# Patient Record
Sex: Male | Born: 1960 | Race: White | Hispanic: No | Marital: Married | Smoking: Current every day smoker
Health system: Southern US, Community
[De-identification: ages and names within clinical notes are randomized; demographics above are authoritative.]

## PROBLEM LIST (undated history)

## (undated) VITALS — BP 108/68 | HR 64 | Temp 97.0°F | Resp 20 | Ht 70.0 in | Wt 139.0 lb

## (undated) DIAGNOSIS — F1010 Alcohol abuse, uncomplicated: Secondary | ICD-10-CM

## (undated) DIAGNOSIS — R011 Cardiac murmur, unspecified: Secondary | ICD-10-CM

## (undated) DIAGNOSIS — R000 Tachycardia, unspecified: Secondary | ICD-10-CM

## (undated) DIAGNOSIS — I639 Cerebral infarction, unspecified: Secondary | ICD-10-CM

## (undated) DIAGNOSIS — N179 Acute kidney failure, unspecified: Secondary | ICD-10-CM

## (undated) DIAGNOSIS — M1990 Unspecified osteoarthritis, unspecified site: Secondary | ICD-10-CM

## (undated) DIAGNOSIS — I10 Essential (primary) hypertension: Secondary | ICD-10-CM

## (undated) DIAGNOSIS — Q676 Pectus excavatum: Secondary | ICD-10-CM

## (undated) DIAGNOSIS — M8700 Idiopathic aseptic necrosis of unspecified bone: Secondary | ICD-10-CM

## (undated) DIAGNOSIS — K219 Gastro-esophageal reflux disease without esophagitis: Secondary | ICD-10-CM

## (undated) DIAGNOSIS — F1310 Sedative, hypnotic or anxiolytic abuse, uncomplicated: Secondary | ICD-10-CM

## (undated) DIAGNOSIS — F319 Bipolar disorder, unspecified: Secondary | ICD-10-CM

## (undated) DIAGNOSIS — B1920 Unspecified viral hepatitis C without hepatic coma: Secondary | ICD-10-CM

## (undated) DIAGNOSIS — Z720 Tobacco use: Secondary | ICD-10-CM

## (undated) DIAGNOSIS — K7460 Unspecified cirrhosis of liver: Secondary | ICD-10-CM

## (undated) DIAGNOSIS — I6529 Occlusion and stenosis of unspecified carotid artery: Secondary | ICD-10-CM

## (undated) DIAGNOSIS — J189 Pneumonia, unspecified organism: Secondary | ICD-10-CM

## (undated) DIAGNOSIS — L409 Psoriasis, unspecified: Secondary | ICD-10-CM

## (undated) DIAGNOSIS — G629 Polyneuropathy, unspecified: Secondary | ICD-10-CM

## (undated) DIAGNOSIS — J392 Other diseases of pharynx: Secondary | ICD-10-CM

## (undated) DIAGNOSIS — M549 Dorsalgia, unspecified: Secondary | ICD-10-CM

## (undated) DIAGNOSIS — H6093 Unspecified otitis externa, bilateral: Secondary | ICD-10-CM

## (undated) HISTORY — DX: Occlusion and stenosis of unspecified carotid artery: I65.29

## (undated) HISTORY — DX: Unspecified otitis externa, bilateral: H60.93

## (undated) HISTORY — DX: Pectus excavatum: Q67.6

## (undated) HISTORY — PX: HERNIA REPAIR: SHX51

## (undated) HISTORY — DX: Acute kidney failure, unspecified: N17.9

## (undated) HISTORY — PX: TONSILLECTOMY: SUR1361

## (undated) HISTORY — DX: Essential (primary) hypertension: I10

## (undated) HISTORY — PX: OTHER SURGICAL HISTORY: SHX169

## (undated) HISTORY — DX: Tachycardia, unspecified: R00.0

## (undated) HISTORY — DX: Sedative, hypnotic or anxiolytic abuse, uncomplicated: F13.10

## (undated) HISTORY — DX: Alcohol abuse, uncomplicated: F10.10

## (undated) HISTORY — DX: Idiopathic aseptic necrosis of unspecified bone: M87.00

## (undated) HISTORY — DX: Tobacco use: Z72.0

## (undated) HISTORY — DX: Polyneuropathy, unspecified: G62.9

## (undated) HISTORY — DX: Bipolar disorder, unspecified: F31.9

## (undated) HISTORY — DX: Psoriasis, unspecified: L40.9

## (undated) HISTORY — DX: Unspecified viral hepatitis C without hepatic coma: B19.20

---

## 1987-08-06 HISTORY — PX: APPENDECTOMY: SHX54

## 1998-01-01 ENCOUNTER — Emergency Department (HOSPITAL_COMMUNITY): Admission: EM | Admit: 1998-01-01 | Discharge: 1998-01-01 | Payer: Self-pay | Admitting: Emergency Medicine

## 1998-03-03 ENCOUNTER — Ambulatory Visit (HOSPITAL_COMMUNITY): Admission: RE | Admit: 1998-03-03 | Discharge: 1998-03-03 | Payer: Self-pay | Admitting: Gastroenterology

## 1998-07-10 ENCOUNTER — Ambulatory Visit (HOSPITAL_COMMUNITY): Admission: RE | Admit: 1998-07-10 | Discharge: 1998-07-10 | Payer: Self-pay | Admitting: *Deleted

## 1998-07-12 ENCOUNTER — Emergency Department (HOSPITAL_COMMUNITY): Admission: EM | Admit: 1998-07-12 | Discharge: 1998-07-12 | Payer: Self-pay | Admitting: Emergency Medicine

## 1998-10-24 ENCOUNTER — Ambulatory Visit (HOSPITAL_COMMUNITY): Admission: RE | Admit: 1998-10-24 | Discharge: 1998-10-24 | Payer: Self-pay | Admitting: Cardiology

## 1998-11-21 ENCOUNTER — Emergency Department (HOSPITAL_COMMUNITY): Admission: EM | Admit: 1998-11-21 | Discharge: 1998-11-21 | Payer: Self-pay | Admitting: Emergency Medicine

## 1998-12-09 ENCOUNTER — Emergency Department (HOSPITAL_COMMUNITY): Admission: EM | Admit: 1998-12-09 | Discharge: 1998-12-10 | Payer: Self-pay | Admitting: Internal Medicine

## 1998-12-22 ENCOUNTER — Emergency Department (HOSPITAL_COMMUNITY): Admission: EM | Admit: 1998-12-22 | Discharge: 1998-12-22 | Payer: Self-pay | Admitting: Emergency Medicine

## 1999-01-04 ENCOUNTER — Emergency Department (HOSPITAL_COMMUNITY): Admission: EM | Admit: 1999-01-04 | Discharge: 1999-01-04 | Payer: Self-pay | Admitting: Emergency Medicine

## 1999-04-27 ENCOUNTER — Encounter: Payer: Self-pay | Admitting: Emergency Medicine

## 1999-04-27 ENCOUNTER — Emergency Department (HOSPITAL_COMMUNITY): Admission: EM | Admit: 1999-04-27 | Discharge: 1999-04-27 | Payer: Self-pay | Admitting: Emergency Medicine

## 1999-08-05 ENCOUNTER — Emergency Department (HOSPITAL_COMMUNITY): Admission: EM | Admit: 1999-08-05 | Discharge: 1999-08-05 | Payer: Self-pay | Admitting: Emergency Medicine

## 1999-08-06 HISTORY — PX: OTHER SURGICAL HISTORY: SHX169

## 1999-12-10 ENCOUNTER — Emergency Department (HOSPITAL_COMMUNITY): Admission: EM | Admit: 1999-12-10 | Discharge: 1999-12-10 | Payer: Self-pay | Admitting: Emergency Medicine

## 2000-01-14 ENCOUNTER — Emergency Department (HOSPITAL_COMMUNITY): Admission: EM | Admit: 2000-01-14 | Discharge: 2000-01-14 | Payer: Self-pay | Admitting: Emergency Medicine

## 2000-01-16 ENCOUNTER — Encounter: Payer: Self-pay | Admitting: Orthopedic Surgery

## 2000-01-16 ENCOUNTER — Inpatient Hospital Stay (HOSPITAL_COMMUNITY): Admission: RE | Admit: 2000-01-16 | Discharge: 2000-01-20 | Payer: Self-pay | Admitting: Orthopedic Surgery

## 2000-01-16 ENCOUNTER — Encounter (INDEPENDENT_AMBULATORY_CARE_PROVIDER_SITE_OTHER): Payer: Self-pay | Admitting: *Deleted

## 2000-01-18 ENCOUNTER — Encounter: Payer: Self-pay | Admitting: Orthopedic Surgery

## 2000-02-03 ENCOUNTER — Emergency Department (HOSPITAL_COMMUNITY): Admission: EM | Admit: 2000-02-03 | Discharge: 2000-02-03 | Payer: Self-pay | Admitting: Emergency Medicine

## 2000-08-05 HISTORY — PX: JOINT REPLACEMENT: SHX530

## 2000-08-05 HISTORY — PX: OTHER SURGICAL HISTORY: SHX169

## 2000-10-16 ENCOUNTER — Ambulatory Visit (HOSPITAL_COMMUNITY): Admission: RE | Admit: 2000-10-16 | Discharge: 2000-10-16 | Payer: Self-pay | Admitting: Family Medicine

## 2000-10-16 ENCOUNTER — Encounter: Payer: Self-pay | Admitting: Family Medicine

## 2000-10-28 ENCOUNTER — Encounter: Payer: Self-pay | Admitting: Family Medicine

## 2000-10-28 ENCOUNTER — Ambulatory Visit (HOSPITAL_COMMUNITY): Admission: RE | Admit: 2000-10-28 | Discharge: 2000-10-28 | Payer: Self-pay | Admitting: Family Medicine

## 2000-12-17 ENCOUNTER — Ambulatory Visit (HOSPITAL_BASED_OUTPATIENT_CLINIC_OR_DEPARTMENT_OTHER): Admission: RE | Admit: 2000-12-17 | Discharge: 2000-12-17 | Payer: Self-pay | Admitting: Orthopedic Surgery

## 2001-02-23 ENCOUNTER — Ambulatory Visit (HOSPITAL_COMMUNITY): Admission: RE | Admit: 2001-02-23 | Discharge: 2001-02-23 | Payer: Self-pay | Admitting: Orthopaedic Surgery

## 2001-05-04 ENCOUNTER — Inpatient Hospital Stay (HOSPITAL_COMMUNITY): Admission: EM | Admit: 2001-05-04 | Discharge: 2001-05-09 | Payer: Self-pay | Admitting: Psychiatry

## 2001-05-12 ENCOUNTER — Other Ambulatory Visit (HOSPITAL_COMMUNITY): Admission: RE | Admit: 2001-05-12 | Discharge: 2001-06-05 | Payer: Self-pay | Admitting: Psychiatry

## 2002-03-26 ENCOUNTER — Encounter: Admission: RE | Admit: 2002-03-26 | Discharge: 2002-03-26 | Payer: Self-pay | Admitting: Internal Medicine

## 2002-08-05 HISTORY — PX: OTHER SURGICAL HISTORY: SHX169

## 2002-10-25 ENCOUNTER — Encounter: Admission: RE | Admit: 2002-10-25 | Discharge: 2002-10-25 | Payer: Self-pay | Admitting: Internal Medicine

## 2002-11-17 ENCOUNTER — Ambulatory Visit (HOSPITAL_COMMUNITY): Admission: RE | Admit: 2002-11-17 | Discharge: 2002-11-17 | Payer: Self-pay

## 2002-11-23 ENCOUNTER — Encounter: Admission: RE | Admit: 2002-11-23 | Discharge: 2002-11-23 | Payer: Self-pay | Admitting: Internal Medicine

## 2002-11-30 ENCOUNTER — Encounter: Admission: RE | Admit: 2002-11-30 | Discharge: 2002-11-30 | Payer: Self-pay | Admitting: Internal Medicine

## 2002-12-31 ENCOUNTER — Encounter: Admission: RE | Admit: 2002-12-31 | Discharge: 2002-12-31 | Payer: Self-pay | Admitting: Internal Medicine

## 2003-02-01 ENCOUNTER — Encounter: Payer: Self-pay | Admitting: Orthopedic Surgery

## 2003-02-02 ENCOUNTER — Encounter: Payer: Self-pay | Admitting: Orthopedic Surgery

## 2003-02-03 ENCOUNTER — Encounter: Payer: Self-pay | Admitting: Orthopedic Surgery

## 2003-02-03 ENCOUNTER — Encounter: Admission: RE | Admit: 2003-02-03 | Discharge: 2003-02-03 | Payer: Self-pay | Admitting: Orthopedic Surgery

## 2003-02-07 ENCOUNTER — Encounter: Payer: Self-pay | Admitting: Orthopedic Surgery

## 2003-02-07 ENCOUNTER — Inpatient Hospital Stay (HOSPITAL_COMMUNITY): Admission: RE | Admit: 2003-02-07 | Discharge: 2003-02-10 | Payer: Self-pay | Admitting: Orthopedic Surgery

## 2003-04-14 ENCOUNTER — Encounter: Admission: RE | Admit: 2003-04-14 | Discharge: 2003-06-29 | Payer: Self-pay | Admitting: Orthopedic Surgery

## 2003-09-28 ENCOUNTER — Encounter: Admission: RE | Admit: 2003-09-28 | Discharge: 2003-09-28 | Payer: Self-pay | Admitting: Internal Medicine

## 2003-10-14 ENCOUNTER — Encounter: Admission: RE | Admit: 2003-10-14 | Discharge: 2003-10-14 | Payer: Self-pay | Admitting: Internal Medicine

## 2003-11-04 ENCOUNTER — Encounter: Admission: RE | Admit: 2003-11-04 | Discharge: 2003-11-04 | Payer: Self-pay | Admitting: Internal Medicine

## 2003-11-08 ENCOUNTER — Encounter: Admission: RE | Admit: 2003-11-08 | Discharge: 2003-11-08 | Payer: Self-pay | Admitting: Internal Medicine

## 2003-11-18 ENCOUNTER — Inpatient Hospital Stay (HOSPITAL_COMMUNITY): Admission: EM | Admit: 2003-11-18 | Discharge: 2003-11-28 | Payer: Self-pay | Admitting: Emergency Medicine

## 2003-12-21 ENCOUNTER — Encounter: Admission: RE | Admit: 2003-12-21 | Discharge: 2003-12-21 | Payer: Self-pay | Admitting: Internal Medicine

## 2004-02-16 ENCOUNTER — Ambulatory Visit (HOSPITAL_COMMUNITY): Admission: RE | Admit: 2004-02-16 | Discharge: 2004-02-16 | Payer: Self-pay | Admitting: Internal Medicine

## 2004-02-16 ENCOUNTER — Encounter: Admission: RE | Admit: 2004-02-16 | Discharge: 2004-02-16 | Payer: Self-pay | Admitting: Internal Medicine

## 2004-03-06 ENCOUNTER — Encounter: Admission: RE | Admit: 2004-03-06 | Discharge: 2004-03-06 | Payer: Self-pay | Admitting: Internal Medicine

## 2004-03-21 ENCOUNTER — Encounter: Admission: RE | Admit: 2004-03-21 | Discharge: 2004-03-21 | Payer: Self-pay | Admitting: Internal Medicine

## 2004-05-14 ENCOUNTER — Ambulatory Visit (HOSPITAL_COMMUNITY): Admission: RE | Admit: 2004-05-14 | Discharge: 2004-05-14 | Payer: Self-pay | Admitting: Internal Medicine

## 2004-05-14 ENCOUNTER — Ambulatory Visit: Payer: Self-pay | Admitting: Internal Medicine

## 2004-05-16 ENCOUNTER — Ambulatory Visit: Payer: Self-pay | Admitting: Internal Medicine

## 2004-08-20 ENCOUNTER — Emergency Department (HOSPITAL_COMMUNITY): Admission: EM | Admit: 2004-08-20 | Discharge: 2004-08-20 | Payer: Self-pay | Admitting: Emergency Medicine

## 2004-11-07 ENCOUNTER — Ambulatory Visit: Payer: Self-pay | Admitting: Internal Medicine

## 2005-02-13 ENCOUNTER — Ambulatory Visit: Payer: Self-pay | Admitting: Internal Medicine

## 2005-02-16 ENCOUNTER — Emergency Department (HOSPITAL_COMMUNITY): Admission: EM | Admit: 2005-02-16 | Discharge: 2005-02-16 | Payer: Self-pay | Admitting: Emergency Medicine

## 2005-08-05 DIAGNOSIS — H6093 Unspecified otitis externa, bilateral: Secondary | ICD-10-CM

## 2005-08-05 HISTORY — DX: Unspecified otitis externa, bilateral: H60.93

## 2005-12-04 ENCOUNTER — Encounter: Payer: Self-pay | Admitting: Emergency Medicine

## 2006-02-21 ENCOUNTER — Ambulatory Visit: Payer: Self-pay | Admitting: Internal Medicine

## 2006-03-04 ENCOUNTER — Encounter (INDEPENDENT_AMBULATORY_CARE_PROVIDER_SITE_OTHER): Payer: Self-pay | Admitting: Internal Medicine

## 2006-03-04 ENCOUNTER — Ambulatory Visit: Payer: Self-pay | Admitting: Internal Medicine

## 2006-03-04 ENCOUNTER — Ambulatory Visit (HOSPITAL_COMMUNITY): Admission: RE | Admit: 2006-03-04 | Discharge: 2006-03-04 | Payer: Self-pay | Admitting: Internal Medicine

## 2006-03-04 LAB — CONVERTED CEMR LAB
Anion Gap: 7
BUN: 14 mg/dL
CO2: 31 meq/L
Creatinine, Ser: 1 mg/dL
Glucose, Bld: 85 mg/dL
Sodium: 139 meq/L

## 2006-03-10 ENCOUNTER — Emergency Department (HOSPITAL_COMMUNITY): Admission: EM | Admit: 2006-03-10 | Discharge: 2006-03-10 | Payer: Self-pay | Admitting: Family Medicine

## 2006-04-13 ENCOUNTER — Emergency Department (HOSPITAL_COMMUNITY): Admission: EM | Admit: 2006-04-13 | Discharge: 2006-04-13 | Payer: Self-pay | Admitting: Family Medicine

## 2006-05-21 ENCOUNTER — Ambulatory Visit (HOSPITAL_COMMUNITY): Admission: RE | Admit: 2006-05-21 | Discharge: 2006-05-21 | Payer: Self-pay | Admitting: Internal Medicine

## 2006-05-21 ENCOUNTER — Ambulatory Visit: Payer: Self-pay | Admitting: Internal Medicine

## 2006-05-28 ENCOUNTER — Encounter: Payer: Self-pay | Admitting: Cardiology

## 2006-05-28 ENCOUNTER — Ambulatory Visit (HOSPITAL_COMMUNITY): Admission: RE | Admit: 2006-05-28 | Discharge: 2006-05-28 | Payer: Self-pay | Admitting: Internal Medicine

## 2006-05-28 ENCOUNTER — Ambulatory Visit: Payer: Self-pay | Admitting: Cardiology

## 2006-06-11 ENCOUNTER — Ambulatory Visit: Payer: Self-pay | Admitting: Hospitalist

## 2006-06-23 ENCOUNTER — Encounter (INDEPENDENT_AMBULATORY_CARE_PROVIDER_SITE_OTHER): Payer: Self-pay | Admitting: Internal Medicine

## 2006-06-23 DIAGNOSIS — I10 Essential (primary) hypertension: Secondary | ICD-10-CM | POA: Insufficient documentation

## 2006-06-23 DIAGNOSIS — F319 Bipolar disorder, unspecified: Secondary | ICD-10-CM | POA: Insufficient documentation

## 2006-06-23 DIAGNOSIS — B999 Unspecified infectious disease: Secondary | ICD-10-CM | POA: Insufficient documentation

## 2006-06-23 DIAGNOSIS — F17200 Nicotine dependence, unspecified, uncomplicated: Secondary | ICD-10-CM

## 2006-06-23 DIAGNOSIS — Z9089 Acquired absence of other organs: Secondary | ICD-10-CM

## 2006-06-23 DIAGNOSIS — B1710 Acute hepatitis C without hepatic coma: Secondary | ICD-10-CM

## 2006-06-23 DIAGNOSIS — Z96619 Presence of unspecified artificial shoulder joint: Secondary | ICD-10-CM

## 2006-06-23 DIAGNOSIS — L408 Other psoriasis: Secondary | ICD-10-CM

## 2006-06-23 DIAGNOSIS — F1021 Alcohol dependence, in remission: Secondary | ICD-10-CM

## 2006-06-23 DIAGNOSIS — M8700 Idiopathic aseptic necrosis of unspecified bone: Secondary | ICD-10-CM | POA: Insufficient documentation

## 2006-06-23 DIAGNOSIS — Z96649 Presence of unspecified artificial hip joint: Secondary | ICD-10-CM

## 2006-08-05 DIAGNOSIS — N179 Acute kidney failure, unspecified: Secondary | ICD-10-CM

## 2006-08-05 HISTORY — DX: Acute kidney failure, unspecified: N17.9

## 2006-08-17 DIAGNOSIS — G609 Hereditary and idiopathic neuropathy, unspecified: Secondary | ICD-10-CM | POA: Insufficient documentation

## 2006-08-17 DIAGNOSIS — Z8679 Personal history of other diseases of the circulatory system: Secondary | ICD-10-CM | POA: Insufficient documentation

## 2006-09-10 ENCOUNTER — Emergency Department (HOSPITAL_COMMUNITY): Admission: EM | Admit: 2006-09-10 | Discharge: 2006-09-10 | Payer: Self-pay | Admitting: Family Medicine

## 2006-09-28 ENCOUNTER — Emergency Department (HOSPITAL_COMMUNITY): Admission: EM | Admit: 2006-09-28 | Discharge: 2006-09-28 | Payer: Self-pay | Admitting: Family Medicine

## 2006-10-13 ENCOUNTER — Telehealth (INDEPENDENT_AMBULATORY_CARE_PROVIDER_SITE_OTHER): Payer: Self-pay | Admitting: *Deleted

## 2006-10-15 ENCOUNTER — Encounter (INDEPENDENT_AMBULATORY_CARE_PROVIDER_SITE_OTHER): Payer: Self-pay | Admitting: Ophthalmology

## 2006-10-15 ENCOUNTER — Ambulatory Visit: Payer: Self-pay | Admitting: Internal Medicine

## 2006-10-15 LAB — CONVERTED CEMR LAB
ALT: 51 units/L (ref 0–53)
Albumin: 3.8 g/dL (ref 3.5–5.2)
BUN: 7 mg/dL (ref 6–23)
Calcium: 9.4 mg/dL (ref 8.4–10.5)
Creatinine, Ser: 0.79 mg/dL (ref 0.40–1.50)
Glucose, Bld: 92 mg/dL (ref 70–99)
Total Protein: 7.5 g/dL (ref 6.0–8.3)

## 2006-11-25 ENCOUNTER — Ambulatory Visit: Payer: Self-pay | Admitting: Hospitalist

## 2006-11-25 ENCOUNTER — Encounter (INDEPENDENT_AMBULATORY_CARE_PROVIDER_SITE_OTHER): Payer: Self-pay | Admitting: Internal Medicine

## 2006-11-25 DIAGNOSIS — F528 Other sexual dysfunction not due to a substance or known physiological condition: Secondary | ICD-10-CM

## 2006-11-25 DIAGNOSIS — M25519 Pain in unspecified shoulder: Secondary | ICD-10-CM | POA: Insufficient documentation

## 2006-12-11 ENCOUNTER — Telehealth (INDEPENDENT_AMBULATORY_CARE_PROVIDER_SITE_OTHER): Payer: Self-pay | Admitting: Infectious Diseases

## 2006-12-11 ENCOUNTER — Emergency Department (HOSPITAL_COMMUNITY): Admission: EM | Admit: 2006-12-11 | Discharge: 2006-12-11 | Payer: Self-pay | Admitting: Emergency Medicine

## 2006-12-11 ENCOUNTER — Telehealth: Payer: Self-pay | Admitting: Internal Medicine

## 2006-12-12 ENCOUNTER — Ambulatory Visit (HOSPITAL_COMMUNITY): Admission: RE | Admit: 2006-12-12 | Discharge: 2006-12-12 | Payer: Self-pay | Admitting: Orthopedic Surgery

## 2006-12-12 ENCOUNTER — Ambulatory Visit: Payer: Self-pay | Admitting: *Deleted

## 2006-12-12 ENCOUNTER — Encounter (INDEPENDENT_AMBULATORY_CARE_PROVIDER_SITE_OTHER): Payer: Self-pay | Admitting: Internal Medicine

## 2006-12-12 LAB — CONVERTED CEMR LAB
Calcium: 10.2 mg/dL (ref 8.4–10.5)
Potassium: 5.3 meq/L (ref 3.5–5.3)
Sodium: 133 meq/L — ABNORMAL LOW (ref 135–145)

## 2006-12-15 ENCOUNTER — Telehealth: Payer: Self-pay | Admitting: *Deleted

## 2006-12-15 ENCOUNTER — Encounter: Payer: Self-pay | Admitting: *Deleted

## 2006-12-16 ENCOUNTER — Encounter (INDEPENDENT_AMBULATORY_CARE_PROVIDER_SITE_OTHER): Payer: Self-pay | Admitting: Internal Medicine

## 2006-12-16 ENCOUNTER — Ambulatory Visit: Payer: Self-pay | Admitting: Internal Medicine

## 2006-12-16 LAB — CONVERTED CEMR LAB
BUN: 46 mg/dL — ABNORMAL HIGH (ref 6–23)
Calcium: 9.8 mg/dL (ref 8.4–10.5)
Creatinine, Ser: 2.18 mg/dL — ABNORMAL HIGH (ref 0.40–1.50)
Glucose, Bld: 124 mg/dL — ABNORMAL HIGH (ref 70–99)

## 2006-12-19 ENCOUNTER — Ambulatory Visit: Payer: Self-pay | Admitting: Hospitalist

## 2006-12-19 ENCOUNTER — Encounter (INDEPENDENT_AMBULATORY_CARE_PROVIDER_SITE_OTHER): Payer: Self-pay | Admitting: Internal Medicine

## 2006-12-19 LAB — CONVERTED CEMR LAB: Hgb A1c MFr Bld: 5.5 %

## 2006-12-22 LAB — CONVERTED CEMR LAB
ALT: 37 units/L (ref 0–53)
Albumin: 4.3 g/dL (ref 3.5–5.2)
CO2: 21 meq/L (ref 19–32)
Calcium: 9.8 mg/dL (ref 8.4–10.5)
Chloride: 106 meq/L (ref 96–112)
Cholesterol: 165 mg/dL (ref 0–200)
Potassium: 4.7 meq/L (ref 3.5–5.3)
Sodium: 139 meq/L (ref 135–145)
Total Protein: 7.6 g/dL (ref 6.0–8.3)

## 2006-12-31 ENCOUNTER — Telehealth: Payer: Self-pay | Admitting: *Deleted

## 2007-01-01 ENCOUNTER — Encounter: Payer: Self-pay | Admitting: Internal Medicine

## 2007-01-13 ENCOUNTER — Inpatient Hospital Stay (HOSPITAL_COMMUNITY): Admission: RE | Admit: 2007-01-13 | Discharge: 2007-01-14 | Payer: Self-pay | Admitting: Orthopedic Surgery

## 2007-01-22 ENCOUNTER — Telehealth (INDEPENDENT_AMBULATORY_CARE_PROVIDER_SITE_OTHER): Payer: Self-pay | Admitting: *Deleted

## 2007-03-18 ENCOUNTER — Ambulatory Visit: Payer: Self-pay | Admitting: Infectious Disease

## 2007-03-18 ENCOUNTER — Encounter (INDEPENDENT_AMBULATORY_CARE_PROVIDER_SITE_OTHER): Payer: Self-pay | Admitting: Internal Medicine

## 2007-03-18 LAB — CONVERTED CEMR LAB
CO2: 26 meq/L (ref 19–32)
Calcium: 9.8 mg/dL (ref 8.4–10.5)
Chloride: 105 meq/L (ref 96–112)
Creatinine, Ser: 1.04 mg/dL (ref 0.40–1.50)
Glucose, Bld: 98 mg/dL (ref 70–99)
Sodium: 139 meq/L (ref 135–145)

## 2007-04-13 ENCOUNTER — Emergency Department (HOSPITAL_COMMUNITY): Admission: EM | Admit: 2007-04-13 | Discharge: 2007-04-13 | Payer: Self-pay | Admitting: Emergency Medicine

## 2007-04-14 ENCOUNTER — Telehealth: Payer: Self-pay | Admitting: *Deleted

## 2007-04-16 ENCOUNTER — Ambulatory Visit: Payer: Self-pay | Admitting: Internal Medicine

## 2007-04-16 ENCOUNTER — Ambulatory Visit (HOSPITAL_COMMUNITY): Admission: RE | Admit: 2007-04-16 | Discharge: 2007-04-16 | Payer: Self-pay | Admitting: Internal Medicine

## 2007-05-21 ENCOUNTER — Ambulatory Visit: Payer: Self-pay | Admitting: Internal Medicine

## 2007-05-21 ENCOUNTER — Encounter (INDEPENDENT_AMBULATORY_CARE_PROVIDER_SITE_OTHER): Payer: Self-pay | Admitting: Internal Medicine

## 2007-05-21 DIAGNOSIS — K089 Disorder of teeth and supporting structures, unspecified: Secondary | ICD-10-CM | POA: Insufficient documentation

## 2007-05-21 LAB — CONVERTED CEMR LAB
AST: 30 units/L (ref 0–37)
Albumin: 4.5 g/dL (ref 3.5–5.2)
Alkaline Phosphatase: 88 units/L (ref 39–117)
Potassium: 4.6 meq/L (ref 3.5–5.3)
Prothrombin Time: 13.3 s (ref 11.6–15.2)
Sodium: 141 meq/L (ref 135–145)
Total Protein: 7.6 g/dL (ref 6.0–8.3)

## 2007-05-24 ENCOUNTER — Inpatient Hospital Stay (HOSPITAL_COMMUNITY): Admission: EM | Admit: 2007-05-24 | Discharge: 2007-05-26 | Payer: Self-pay | Admitting: Emergency Medicine

## 2007-05-24 ENCOUNTER — Ambulatory Visit: Payer: Self-pay | Admitting: Internal Medicine

## 2007-06-22 ENCOUNTER — Emergency Department (HOSPITAL_COMMUNITY): Admission: EM | Admit: 2007-06-22 | Discharge: 2007-06-22 | Payer: Self-pay | Admitting: Emergency Medicine

## 2007-07-22 ENCOUNTER — Encounter (INDEPENDENT_AMBULATORY_CARE_PROVIDER_SITE_OTHER): Payer: Self-pay | Admitting: Internal Medicine

## 2007-07-22 ENCOUNTER — Ambulatory Visit: Payer: Self-pay | Admitting: Hospitalist

## 2007-07-22 DIAGNOSIS — T50901A Poisoning by unspecified drugs, medicaments and biological substances, accidental (unintentional), initial encounter: Secondary | ICD-10-CM | POA: Insufficient documentation

## 2007-07-24 LAB — CONVERTED CEMR LAB
ALT: 66 units/L — ABNORMAL HIGH (ref 0–53)
AST: 47 units/L — ABNORMAL HIGH (ref 0–37)
Alkaline Phosphatase: 98 units/L (ref 39–117)
Basophils Absolute: 0 10*3/uL (ref 0.0–0.1)
Basophils Relative: 1 % (ref 0–1)
Calcium: 9.8 mg/dL (ref 8.4–10.5)
Chloride: 101 meq/L (ref 96–112)
Creatinine, Ser: 1.31 mg/dL (ref 0.40–1.50)
Eosinophils Absolute: 0.1 10*3/uL — ABNORMAL LOW (ref 0.2–0.7)
Hemoglobin: 15.3 g/dL (ref 13.0–17.0)
MCHC: 32.8 g/dL (ref 30.0–36.0)
Monocytes Absolute: 0.3 10*3/uL (ref 0.1–1.0)
Neutro Abs: 1.8 10*3/uL (ref 1.7–7.7)
Neutrophils Relative %: 38 % — ABNORMAL LOW (ref 43–77)
RDW: 13.6 % (ref 11.5–15.5)

## 2007-08-01 ENCOUNTER — Emergency Department (HOSPITAL_COMMUNITY): Admission: EM | Admit: 2007-08-01 | Discharge: 2007-08-01 | Payer: Self-pay | Admitting: Emergency Medicine

## 2007-08-03 ENCOUNTER — Emergency Department (HOSPITAL_COMMUNITY): Admission: EM | Admit: 2007-08-03 | Discharge: 2007-08-03 | Payer: Self-pay | Admitting: Family Medicine

## 2007-08-04 ENCOUNTER — Other Ambulatory Visit: Payer: Self-pay

## 2007-08-05 ENCOUNTER — Inpatient Hospital Stay (HOSPITAL_COMMUNITY): Admission: EM | Admit: 2007-08-05 | Discharge: 2007-08-05 | Payer: Self-pay | Admitting: Emergency Medicine

## 2007-08-05 ENCOUNTER — Ambulatory Visit: Payer: Self-pay | Admitting: Internal Medicine

## 2007-08-14 ENCOUNTER — Emergency Department (HOSPITAL_COMMUNITY): Admission: EM | Admit: 2007-08-14 | Discharge: 2007-08-14 | Payer: Self-pay | Admitting: Emergency Medicine

## 2007-09-03 ENCOUNTER — Encounter (INDEPENDENT_AMBULATORY_CARE_PROVIDER_SITE_OTHER): Payer: Self-pay | Admitting: Internal Medicine

## 2007-09-03 ENCOUNTER — Ambulatory Visit: Payer: Self-pay | Admitting: Internal Medicine

## 2007-09-03 LAB — CONVERTED CEMR LAB
INR: 1.1 (ref 0.0–1.5)
Prothrombin Time: 14 s (ref 11.6–15.2)

## 2007-09-04 LAB — CONVERTED CEMR LAB
AFP-Tumor Marker: 3 ng/mL (ref 0.0–8.0)
Albumin: 4.7 g/dL (ref 3.5–5.2)
Alkaline Phosphatase: 90 units/L (ref 39–117)
BUN: 7 mg/dL (ref 6–23)
CO2: 25 meq/L (ref 19–32)
Calcium: 10 mg/dL (ref 8.4–10.5)
Chloride: 104 meq/L (ref 96–112)
Glucose, Bld: 98 mg/dL (ref 70–99)
Potassium: 4.7 meq/L (ref 3.5–5.3)
Total Protein: 7.9 g/dL (ref 6.0–8.3)

## 2007-09-17 ENCOUNTER — Emergency Department (HOSPITAL_COMMUNITY): Admission: EM | Admit: 2007-09-17 | Discharge: 2007-09-17 | Payer: Self-pay | Admitting: Family Medicine

## 2008-02-08 ENCOUNTER — Ambulatory Visit: Payer: Self-pay | Admitting: Internal Medicine

## 2008-02-08 ENCOUNTER — Encounter (INDEPENDENT_AMBULATORY_CARE_PROVIDER_SITE_OTHER): Payer: Self-pay | Admitting: *Deleted

## 2008-02-08 LAB — CONVERTED CEMR LAB
AST: 34 units/L (ref 0–37)
Albumin: 4.5 g/dL (ref 3.5–5.2)
Alkaline Phosphatase: 76 units/L (ref 39–117)
Potassium: 4.6 meq/L (ref 3.5–5.3)
Sodium: 137 meq/L (ref 135–145)
Total Bilirubin: 0.4 mg/dL (ref 0.3–1.2)
Total Protein: 7.4 g/dL (ref 6.0–8.3)

## 2008-05-18 ENCOUNTER — Telehealth (INDEPENDENT_AMBULATORY_CARE_PROVIDER_SITE_OTHER): Payer: Self-pay | Admitting: Internal Medicine

## 2008-06-08 ENCOUNTER — Ambulatory Visit: Payer: Self-pay | Admitting: Infectious Diseases

## 2008-06-08 ENCOUNTER — Encounter (INDEPENDENT_AMBULATORY_CARE_PROVIDER_SITE_OTHER): Payer: Self-pay | Admitting: Internal Medicine

## 2008-06-09 LAB — CONVERTED CEMR LAB
ALT: 53 units/L (ref 0–53)
CO2: 28 meq/L (ref 19–32)
Calcium: 10.9 mg/dL — ABNORMAL HIGH (ref 8.4–10.5)
Chloride: 105 meq/L (ref 96–112)
Cholesterol: 147 mg/dL (ref 0–200)
Creatinine, Ser: 0.83 mg/dL (ref 0.40–1.50)
Glucose, Bld: 91 mg/dL (ref 70–99)
Total Bilirubin: 0.3 mg/dL (ref 0.3–1.2)
Total CHOL/HDL Ratio: 3.9
Total Protein: 7.7 g/dL (ref 6.0–8.3)
Triglycerides: 85 mg/dL (ref <150)
VLDL: 17 mg/dL (ref 0–40)

## 2008-10-25 ENCOUNTER — Ambulatory Visit: Payer: Self-pay | Admitting: *Deleted

## 2009-04-18 ENCOUNTER — Telehealth: Payer: Self-pay | Admitting: Internal Medicine

## 2009-05-18 ENCOUNTER — Telehealth: Payer: Self-pay | Admitting: Internal Medicine

## 2009-07-12 ENCOUNTER — Telehealth: Payer: Self-pay | Admitting: Internal Medicine

## 2009-07-19 ENCOUNTER — Telehealth: Payer: Self-pay | Admitting: Internal Medicine

## 2009-08-05 DIAGNOSIS — I639 Cerebral infarction, unspecified: Secondary | ICD-10-CM

## 2009-08-05 HISTORY — DX: Cerebral infarction, unspecified: I63.9

## 2009-11-09 ENCOUNTER — Telehealth: Payer: Self-pay | Admitting: Internal Medicine

## 2009-12-27 ENCOUNTER — Emergency Department (HOSPITAL_COMMUNITY): Admission: EM | Admit: 2009-12-27 | Discharge: 2009-12-27 | Payer: Self-pay | Admitting: Family Medicine

## 2010-04-30 ENCOUNTER — Observation Stay (HOSPITAL_COMMUNITY)
Admission: EM | Admit: 2010-04-30 | Discharge: 2010-05-01 | Payer: Self-pay | Source: Home / Self Care | Admitting: Emergency Medicine

## 2010-05-01 ENCOUNTER — Encounter (INDEPENDENT_AMBULATORY_CARE_PROVIDER_SITE_OTHER): Payer: Self-pay | Admitting: Emergency Medicine

## 2010-05-01 ENCOUNTER — Ambulatory Visit: Payer: Self-pay | Admitting: Vascular Surgery

## 2010-06-26 ENCOUNTER — Telehealth: Payer: Self-pay | Admitting: Internal Medicine

## 2010-09-06 NOTE — Progress Notes (Signed)
Summary: Refill/gh  Phone Note Refill Request Message from:  Fax from Pharmacy on June 26, 2010 12:27 PM  Refills Requested: Medication #1:  CLONIDINE HCL 0.3 MG TABS Take 1 tablet by mouth two times a day  Method Requested: Electronic Initial call taken by: Angelina Ok RN,  June 26, 2010 12:27 PM  Follow-up for Phone Call       Follow-up by: Blondell Reveal MD,  June 29, 2010 2:50 PM    Prescriptions: CLONIDINE HCL 0.3 MG TABS (CLONIDINE HCL) Take 1 tablet by mouth two times a day  #62 x 11   Entered and Authorized by:   Blondell Reveal MD   Signed by:   Blondell Reveal MD on 06/29/2010   Method used:   Electronically to        [facility] [address] [phone]* (retail)       [address]       [place], [place]  [address]       Ph: [phone] or [phone]       Fax: [phone]   RxID:   [id number]

## 2010-09-06 NOTE — Progress Notes (Signed)
Summary: refill/gg  Phone Note Refill Request  on November 09, 2009 11:34 AM  Refills Requested: Medication #1:  ATENOLOL 100 MG TABS Take 1 tablet by mouth once a day   Last Refilled: 10/13/2009  Method Requested: Electronic Initial call taken by: Merrie Roof RN,  November 09, 2009 11:36 AM  Follow-up for Phone Call        Please refer to last refill request in December.  Will provide 1 month supply and no further refills as I have not seen pt.  Follow-up by: Mariea Stable MD,  November 09, 2009 3:32 PM    Prescriptions: ATENOLOL 100 MG TABS (ATENOLOL) Take 1 tablet by mouth once a day  #31 x 0   Entered and Authorized by:   Mariea Stable MD   Signed by:   Mariea Stable MD on 11/09/2009   Method used:   [facility]        [facility] [address] [phone]* (retail)       [address]       [place], [place]  [address]       Ph: [phone] or [phone]       Fax: [phone]   RxID:   [id number]   Appended Document: refill/gg Called patient and confirmed appointment already sch for 12/08/2009 at 2:10pm.

## 2010-10-04 ENCOUNTER — Ambulatory Visit: Payer: Self-pay | Admitting: Gastroenterology

## 2010-10-18 LAB — URINALYSIS, ROUTINE W REFLEX MICROSCOPIC
Nitrite: NEGATIVE
Specific Gravity, Urine: 1.007 (ref 1.005–1.030)
pH: 6.5 (ref 5.0–8.0)

## 2010-10-18 LAB — LIPID PANEL
LDL Cholesterol: 79 mg/dL (ref 0–99)
VLDL: 15 mg/dL (ref 0–40)

## 2010-10-18 LAB — CBC
HCT: 44.5 % (ref 39.0–52.0)
Hemoglobin: 14.9 g/dL (ref 13.0–17.0)
MCV: 100.5 fL — ABNORMAL HIGH (ref 78.0–100.0)
RDW: 12.5 % (ref 11.5–15.5)
WBC: 4.6 10*3/uL (ref 4.0–10.5)

## 2010-10-18 LAB — COMPREHENSIVE METABOLIC PANEL
ALT: 81 U/L — ABNORMAL HIGH (ref 0–53)
Alkaline Phosphatase: 81 U/L (ref 39–117)
Glucose, Bld: 91 mg/dL (ref 70–99)
Potassium: 4.5 mEq/L (ref 3.5–5.1)
Sodium: 135 mEq/L (ref 135–145)
Total Protein: 6.8 g/dL (ref 6.0–8.3)

## 2010-10-18 LAB — HEMOGLOBIN A1C: Mean Plasma Glucose: 123 mg/dL — ABNORMAL HIGH (ref <117)

## 2010-10-18 LAB — PROTIME-INR: Prothrombin Time: 13.1 seconds (ref 11.6–15.2)

## 2010-10-18 LAB — CK TOTAL AND CKMB (NOT AT ARMC)
CK, MB: 1 ng/mL (ref 0.3–4.0)
Total CK: 46 U/L (ref 7–232)

## 2010-10-18 LAB — LITHIUM LEVEL: Lithium Lvl: 1.14 mEq/L (ref 0.80–1.40)

## 2010-10-18 LAB — APTT: aPTT: 26 seconds (ref 24–37)

## 2010-11-20 ENCOUNTER — Encounter: Payer: Self-pay | Admitting: Ophthalmology

## 2010-12-18 NOTE — Discharge Summary (Signed)
[name], [name]             ACCOUNT NO.:  [id number]   MEDICAL RECORD NO.:  [id number]          PATIENT TYPE:  INP   LOCATION:  6738                         FACILITY:  [facility]   PHYSICIAN:  Alvester Morin, M.D.  DATE OF BIRTH:  January 26, 1961   DATE OF ADMISSION:  05/23/2007  DATE OF DISCHARGE:  05/26/2007                               DISCHARGE SUMMARY   DISCHARGE DIAGNOSES:  1. Acetaminophen overdose/Vicodin overdose.  2. Bipolar disorder.  3. Hypertension.  4. Acute renal failure.  5. 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  6. Tobacco abuse.   PAST MEDICAL HISTORY:  1. 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  2. Hypertension.  3. Hepatitis C.  4. Bipolar disorder.  5. Rib fracture of the left 9th and 10th ribs.   ALLERGIES:  INCLUDE FENTANYL AND DILAUDID AND PROCHLORPERAZINE.   DISCHARGE MEDICATIONS:  1. Clonidine 0.3 mg one tablet p.o. b.i.d.  2. Lamictal one tablet p.o. b.i.d.  3. Atenolol 100 mg one tablet p.o. daily.  4. Seroquel 200 mg p.o. in the morning, another tablet at noon, and      two tablets which is 400 mg in the evening.  5. Klonopin 1 mg p.o. b.i.d.  6. Enbrel 50 mg injected subcutaneously every Friday.   DISPOSITION AND FOLLOWUP:  Patient is to call the outpatient clinic to  schedule an appointment within about three weeks or so to be seen.  At  this time, it important to assess a complete metabolic panel to make  sure patient's LFTs are within normal range which they have been  throughout the hospitalization given his acetaminophen overdose.  Also,  it is important to assess the patient's mental status and assess for any  suicidal ideations.  Patient also to follow with Dr. [name] on  June 10, 2007 at 2:30 p.m.  Again, given patient's history of the  acetaminophen overdose with Vicodin, it is important to assess that the  patient does not have any suicidal ideations and to make sure  his LFTs  are still within normal limits and make sure other overdoses have not  occurred.   PROCEDURES PERFORMED:  None on this admission.   CONSULTATIONS:  No consultations ordered this admission.   BRIEF HISTORY AND PHYSICAL:  [name] is a 50 year old white male  with a history of bipolar disorder, hepatitis C and recent rib fracture,  who was brought to the emergency department early this morning by his  brother after it was noted that the patient had taken 97 Vicodin since  being prescribed it on Thursday, October 16.  The last pill was taken  approximately 24 hours prior to admission at 1:00 a.m. on October 17.  Patient had been noted to be drowsy which is somewhat unusual for him on  his Seroquel and he was also recently started on Klonopin, again on  October 16, for a tic disorder that was noted.  Patient began vomiting  the evening of October 18 x4 and was brought to the emergency department  once it was realized  how much Vicodin he had taken.  His mental status  quickly returned to normal once he began vomiting.  He does not remember  taking the Vicodin.  He denies any suicidal ideation.  He is bipolar,  but denies any current mania or depression.  He denies abdominal pain or  jaundice.  Vomiting and nausea had been resolved for the last two to  three hours.  Vicodin had been prescribed for treatment of pain related  to the left rib fractures of ribs 9 and 10 back in September of 2008.   PHYSICAL EXAMINATION:  VITAL SIGNS:  Temperature 97.8, blood pressure  130/89, pulse of 62, respiratory rate of 16, oxygen saturation 99% on  room air.  GENERAL:  Patient was alert in no acute distress  EYES:  PERRLA.  EOMI.  Anicteric.  ENT:  Moist pink mucous membranes.  NECK:  Supple.  LUNGS:  Respirations were clear to auscultation bilaterally with a  normal respiratory effort.  CARDIOVASCULAR:  Regular rate and rhythm.  No murmurs, gallops or rubs  auscultated.  GI:  Positive  bowel sounds.  Soft, nontender, nondistended without any  rebound or guarding.  EXTREMITIES:  No cyanosis, clubbing or edema noted.  NEURO:  Patient was alert and oriented x3.  Cranial nerves II-XII  grossly intact.  Finger-to-nose was normal.  Strength was normal.  PSYCH:  Patient had a normal affect.   LABS:  Sodium of 138, potassium 4.3, chloride of 103, bicarb of 27, BUN  17, creatinine 1.57, glucose of 85.  Bilirubin 0.7, alk phos 87, AST 30,  ALT 33, protein 7.8, albumin 4.5, calcium 9.6.  WBC 5.1, hemoglobin  13.8, platelets 159.  Salicylate less than 4.  Acetaminophen level 31.5.  Urine drug screen was positive for benzodiazepines and opiates.  Urinalysis was within normal limits.  Ethanol level was less than 5.  Lithium was 0.86.  PTT was 28, PT 13.9, INR 1.1.   Patient's ECG was noted to have normal sinus rhythm with first-degree AV  block and occasional PVCs.   HOSPITAL COURSE:  1. Vicodin/acetaminophen overdose.  Patient was given charcoal and      acetylcysteine upon arrival to the hospital.  Patient was admitted      to the ICU for monitoring given the possibility of suicide attempt.      Patient continued to deny intentional overdose.  Acetaminophen the      following day was less than 10 and LFTs had been within normal      limits.  At some point within the first 24 hours, nursing contacted      poison control and the acetylcysteine was discontinued per poison      control protocol.  Apparently, there was no formal order written      for this.  By the following day of admission, patient was doing      well, had no nausea or vomiting, was eating as well as urinating      well and, again, the acetaminophen level had been less than 10 with      LFTs within the normal range.  Patient apparently had no      complications from the overdose.  2. Bipolar disorder.  Patient was kept on Lamictal as well as lithium,      though the Seroquel and Klonopin were held initially  because of the      fact that they caused drowsiness apparently per the patient's      story.  Seroquel as well as Klonopin was restarted because of      bipolar disorder along with the Klonopin for the tic disorder at      lower dosages.  Dr. Jeanie Sewer was consulted from psychiatry who saw      patient and believed the patient was stable for discharge.  Also      agreed with current medications.  3. Hypertension.  Patient was well controlled with the home      medications.  The acute renal failure was likely secondary to      prerenal etiology and resolved quickly.  4. Tic disorder.  Klonopin was restarted and Dr. Jeanie Sewer was in      agreement with reinstitution of the Klonopin.  5. Tobacco abuse.  Patient was counseled regarding smoking cessation.   DISPOSITION:  Patient did not have any adverse effects apparently from  the acetaminophen overdose and; therefore, he was discharged to follow  up.   LABS ON THE DAY OF DISCHARGE:  Sodium of 142, potassium 3.6, chloride  112, bicarb 24, glucose of 97, BUN 4, creatinine 0.71.  Total bilirubin  0.3, alk phos 65, AST 27, ALT 30, total protein 5.7, albumin 3.2,  calcium 9.0.  PT of 13.4, INR 1.0.  WBCs 4.0, hemoglobin 12.3 and  platelets 103.      Mariea Stable, MD  Electronically Signed      Alvester Morin, M.D.  Electronically Signed    MA/MEDQ  D:  05/26/2007  T:  05/26/2007  Job:  [id number]

## 2010-12-18 NOTE — Discharge Summary (Signed)
[name], [name]             ACCOUNT NO.:  [id number]   MEDICAL RECORD NO.:  [id number]          PATIENT TYPE:  INP   LOCATION:  1614                         FACILITY:  [facility]   PHYSICIAN:  Almedia Balls. Ranell Patrick, M.D. DATE OF BIRTH:  04/24/1961   DATE OF ADMISSION:  01/12/2007  DATE OF DISCHARGE:  01/14/2007                               DISCHARGE SUMMARY   ADMISSION DIAGNOSIS:  Right shoulder pain secondary to avascular  necrosis.   DISCHARGE DIAGNOSIS:  Right shoulder pain secondary to avascular  necrosis status post shoulder arthroplasty.   BRIEF HISTORY:  The patient is a 50 year old male with a history of  avascular necrosis to his left shoulder and also bilateral hips.  The  patient was having some increasing pain in his right shoulder and has  elected to have a right shoulder arthroplasty by Dr. Malon Melendez after  MRI demonstrated avascular necrosis.   OPERATIVE REPORT:  The patient had a right shoulder arthroplasty on January 12, 2007, by Dr. Malon Melendez.  Assistant was [employer], PA-C.  Anesthesia was general plus interscalene.  Estimated blood loss was 100  mL and no complications.   HOSPITAL COURSE:  The patient was admitted on January 12, 2007, for the  above-stated procedure, which he tolerated very well.  He was  transferred to post anesthesia care unit and up to [address].  Postoperative day #1 the patient complained about moderate pain to that  right shoulder.  His incision was healing well.  No signs of cellulitis,  erythema or infection.  His labs were within normal limits.  Postoperative day #2, the patient states his pain was under much better  control and he was ready to be discharged home.  Neurovascularly, he was  intact in a sling and his dressing was changed with no signs of  infection on exam.   DISCHARGE/PLAN:  The patient will be discharged home on January 14 2007.   FOLLOWUP:  The patient will follow back up with Dr. Malon Melendez in 2  weeks.   CONDITION:  Stable.   DIET:  Regular.   The patient has drug allergies to FENTANYL, DILAUDID, ERYTHROMYCIN,  PROCHLORPERAZINE and MOTRIN.   DISCHARGE MEDICATIONS:  1. Atenolol 100 mg p.o. daily.  2. Clonidine 0.3 mg p.o. b.i.d.  3. Lamictal 200 mg p.o. b.i.d.  4. Robaxin 500 mg p.o. q.6h.  5. Seroquel 200 mg b.i.d. and 400 mg q.h.s.  6. Percocet 5/325 one to two tablets q.4-6h. p.r.n. pain.      Thomas B. Durwin Nora, P.A.      Almedia Balls. Ranell Patrick, M.D.  Electronically Signed    TBD/MEDQ  D:  01/14/2007  T:  01/14/2007  Job:  [id number]

## 2010-12-18 NOTE — H&P (Signed)
NAMERADAMES, MEJORADO             ACCOUNT NO.:  [id number]   MEDICAL RECORD NO.:  [id number]          PATIENT TYPE:  INP   LOCATION:  2105                         FACILITY:  [facility]   PHYSICIAN:  C. Ulyess Mort, M.D.DATE OF BIRTH:  1961-04-29   DATE OF ADMISSION:  05/23/2007  DATE OF DISCHARGE:                              HISTORY & PHYSICAL   CHIEF COMPLAINT:  Vicodin overdose.   HISTORY OF PRESENT ILLNESS:  The patient is a 50 year old white male  with history of bipolar disorder, hepatitis C, recent rib fracture who  was brought to the emergency department early this morning by his  brother after it was noted that he had taken 34 Vicodin since been  prescribed that on Thursday, October 1968.  His last pill was taken  approximately 27 hours ago at 1 a.m. the morning on October 17.  He had  been  drowsy which is somewhat usual for him on a Seroquel.  He also was  recently started on Klonopin on for a tic disorder. His drowsiness was  felt due to that.  He began vomiting in the evening of October 18,  vomiting our times today and he was brought to the emergency department  once it was realized how much Vicodin he had taken.  On his mental  status, he quickly returned to normal once arriving to the ED.  He  does  not remember taking the Vicodin.  He denies any suicidal ideation.  He  is bipolar, but denies any current anemia or depression.  He denies  abdominal pain or jaundice.  Vomiting and nausea that has been resolved  for two to three hours.  Vicodin had been prescribed for treatment of  pain related to the left rib fracture that he suffered month or two ago.   PAST MEDICAL HISTORY:  1. Avascular necrosis in his hips and shoulders.  2. Prednisone use for psoriasis.  3. Hypertension.  4. Hepatitis.  5. Bipolar disorder.  6. Rib fracture last night, left 9th and 10th rib.   PAST SURGICAL HISTORY:  1. Appendectomy.  2. Multiple back surgeries.  3. Bilateral total shoulder  replacement well as a lateral total hip      replacement.   HOME MEDICATIONS:  1. Clonidine 0.3 mg p.o. b.i.d.  2. Lamictal 2 mg p.o. b.i.d.  3. Enbrel 25 mg subcutaneously two times a week.  4. Seroquel 200 mg p.o. q.a.m., 400 mg p.o. at bedtime.  5. Viagra 50 mg p.r.n.  6. Atenolol 100 mg once daily.  7. Lithium 1 tablet once daily.  8. Vicodin 5/500 1 tablet every 8 hours p.r.n.  9. Klonopin 1 mg p.o. b.i.d.   ALLERGIES:  FENTANYL  and DILAUDID cause itching.  PROCHLORPERAZINE  caused jaw locking.   SOCIAL HISTORY:  He is one-pack per day smoker for 32 years.  He denies  any alcohol or other drug use.  He is married and lives with his wife.  He has no children.  He is stable due to his bipolar disorder and  chronic joint issues.   FAMILY HISTORY:  Mother alive and well  at age 61 with hypertension.  Father alive at age 78 with obesity and bipolar disorder.  He has a  younger brother  with bipolar disorder.  He has no children.   REVIEW OF SYSTEMS:  Denies any fever, fatigue, diaphoresis, night  sweats, acute pain. chest pain, shortness of breath, cough, diarrhea,  constipation, dysuria, rashes, hallucinations, vision changes.   PHYSICAL EXAMINATION:  VITAL SIGNS:  Temperature  97.8, blood pressure  130/89, pulse 62, respiratory rate 16, oxygen saturation 99% on room  air.  GENERAL:  He is alert and in no acute distress.  HEENT:  Pupils are equally round, reactive to light, and accommodation.  Extraocular motion intact.  There is no scleral icterus.  Mucous  membranes are moist.  NECK:  Supple.  RESPIRATORY:  No increased work of breathing, normal respiratory effort.  Clear to auscultation bilaterally.  CARDIOVASCULAR:  Regular rate and rhythm.  No murmurs, rubs or gallops.  ABDOMEN:  Abdominal exam positive bowel sounds, soft, nontender,  nondistended.  No rebound or guarding.  EXTREMITIES:  No clubbing, cyanosis or edema.  NEUROLOGIC:  Alert and oriented x3.  Cranial  nerves II-XII grossly  intact.  Finger to nose normal.  PSYCHIATRIC:  Normal affect.   LABORATORY DATA:  Sodium 138, potassium 4.3, chloride 103, bicarb 27,  BUN 17, creatinine 1.57, glucose 85, bilirubin 0.7, alk phos 87, AST 30,  ALT 33, protein 7.8, albumin 4.5, calcium 9.6.  White blood cell count  5.1, hemoglobin 13.8, hematocrit 41.3, plate count 161.  Salicylate  level less than 4.  Acetaminophen level elevated to 31.5 mcg/mL.  Urine  drug screen positive for benzos and opiates.  Urinalysis was normal.  Alcohol level less than 5.  Lithium level 0.86 which is therapeutic.  PTT 28.  PT 13.9, INR 1.1.   An EKG showed normal sinus rhythm at 68 beats per minute with first-  degree AV block and occasional PVC.   ASSESSMENT:  A 50 year old white male with history of bipolar disorder,  tic disorder, now with overdose of Vicodin.  1. Acetaminophen overdose.  No current LFT abnormalities but he still      has elevated salicylate level and is at risk for hepatotoxicity.      He is outside the window for  charcoal, but will start [employer]      protocol.  Follow LFTs and salicylate levels.  Will obtain      psychiatric consultation.  2. Bipolar disorder.  Lithium level at goal.  We will hold Seroquel      this morning as it does cause him drowsiness.  His mental status      needs to be observed closely.  He is not had currently depressed      and this was felt to be an accidental overdose.  He adamantly      denies any suicidal ideation.  3. Hypertension.  Will continue him on his home regimen of atenolol,      clonidine.  4. Acute renal failure.  Creatinine slightly above baseline.  Likely      prerenal etiology.  Unlikely to have renal damage from his      acetaminophen without liver damage.  Will give IV fluids and follow      levels.  Urinalysis was within normal limits.  5. Tic disorder. We will hold the patient's clonidine for now as this      was a new medicine that also resulted  could have contributed to his  drowsiness.  We will reassess once he is more stable.  6. Tobacco abuse.  Recommend smoking cessation.  7. Deep venous thrombosis prophylaxis.  Place the patient on SCDs.  8. Gastrointestinal prophylaxis.  Will start for Protonix.      Benn Moulder, M.D.  Electronically Signed      C. Ulyess Mort, M.D.  Electronically Signed    MR/MEDQ  D:  05/24/2007  T:  05/25/2007  Job:  [id number]

## 2010-12-18 NOTE — Consult Note (Signed)
[name], [name]             ACCOUNT NO.:  [id number]   MEDICAL RECORD NO.:  [id number]          PATIENT TYPE:  INP   LOCATION:  6738                         FACILITY:  [facility]   PHYSICIAN:  Antonietta Breach, M.D.  DATE OF BIRTH:  May 09, 1961   DATE OF CONSULTATION:  05/26/2007  DATE OF DISCHARGE:  05/26/2007                                 CONSULTATION   REQUESTING PHYSICIAN:  Alvester Morin, M.D.   REASON FOR CONSULTATION:  Overdose.   HISTORY OF PRESENT ILLNESS:  Mr. [name] is a 50 year old male  admitted to the [facility] on May 23, 2007 due to an  overdose.   Mr. [name] denies any suicidal thoughts.  He does not have a  recollection for the overdose of Vicodin.  He describes normal mood,  normal interests and constructive future goals.  He has no thoughts of  harming himself or others.  He has no hallucinations or delusions.  His  energy is still slightly decreased.  He is oriented to all spheres.  His  memory function is intact.  He is cooperative with staff and socially  appropriate.   The patient does have a history of excessive worry, feeling on edge,  muscle tension.  He also has a history of a tic that has been treated  with Klonopin.  His Klonopin has recently been increased in his  outpatient regimen to 2 mg b.i.d.   The patient was taking his Klonopin regularly at the time of the  overdose at 4 mg per day.  He sustained rib fractures on the left side,  ribs 9 and 10, and received a Vicodin prescription on October 16.  He  does not recall a period that began approximately 1-1/2 days later.  He  recalls taking some Vicodin for his pain.  However, he took more than  prescribed.  He does acknowledge taking 3 Vicodin.  From that point on  he recalls nothing until waking up in the hospital.  By report he took  all 33 Vicodin.   The patient's initial Tylenol level was 31.5.  It is unclear at what  point this Tylenol level was taken in  relationship to the overdose time,  nor is it known over what time that the patient took the 33 Vicodin.   PAST PSYCHIATRIC HISTORY:  Several years ago the patient was admitted to  the [facility] and was treated with depression, panic  and alcohol dependence.   The patient also has a history of 2 [facility] psychiatric  admissions.   He does have a history of impulsive spending sprees.  He has no history  of suicide attempts.   HIS HOME PSYCHIATRIC MEDICATION REGIMEN HAS INCLUDED RECENTLY:  1. Lamictal 200 mg b.i.d.  2. Seroquel 200 mg in the morning, 400 mg at night.  3. Lithium 600 mg daily.   The patient has a long-term history of severe alcohol dependence.  However, he has been sober for 13 months.  He does have remote history  of cocaine use, none recently.   FAMILY PSYCHIATRIC HISTORY:  The patient's  brother has bipolar disorder.  His father had bipolar disorder.   SOCIAL HISTORY:  Mr. [name] is married.  He lives with his wife.  He  does not have any children.  His religion is Catholic.  He reports being  active in the 12-step community, attending groups.  Occupation,  medically disabled.   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  He has undergone bilateral shoulder and hip  replacements, psoriasis treatment treated with prednisone, hypertension,  history of hepatitis.  Other surgical history includes appendectomy.  He  has had multiple back surgeries.   ALLERGIES:  1. COMPAZINE.  2. FENTANYL.  3. HYDROMORPHONE.  4. ERYTHROMYCIN.  5. IBUPROFEN.   MEDICATIONS:  MAR is reviewed.  The patient is on:   1. Lithium 600 mg daily.  2. Seroquel 200 mg b.i.d.  3. Lamictal 200 mg b.i.d.  4. Klonopin 2 mg b.i.d.   REVIEW OF SYSTEMS:  CONSTITUTIONAL:  Afebrile.  No weight loss.  HEAD:  No trauma.  EYES:  No visual changes.  EARS:  No hearing impairment.  NOSE:  No rhinorrhea.  MOUTH AND  THROAT:  No sore throat. NEUROLOGIC:  No focal motor or sensory deficits.  PSYCHIATRIC:  As above.  CARDIOVASCULAR:  No chest pain, palpitations.  RESPIRATORY:  No cough or  wheezing.  GASTROINTESTINAL:  There is no vomiting.  The patient's SGOT  27, SGPT 30.  GENITOURINARY:  BUN 4, creatinine 0.71.  SKIN:  Unremarkable.  ENDOCRINE/METABOLIC:  No heat or cold intolerance.  MUSCULOSKELETAL:  No deformities.  HEMATOLOGIC/LYMPHATIC:  WBC 4,  hemoglobin 12.3, platelet count 103.   OTHER LABORATORY DATA:  Lithium 0.86.  Alcohol negative.  Urine drug  screen positive for benzodiazepines and opiates.  Tylenol 31.5  initially.  Aspirin negative.   EXAMINATION:  VITAL SIGNS:  Temperature 97.6, pulse 63, respiratory rate  18, blood pressure 101/65, O2 saturation on room air 95%.  GENERAL APPEARANCE:  Mr. [name] is a middle-aged male lying in a supine  position in his hospital bed in no apparent distress.  He has no  abnormal involuntary movements.  OTHER MENTAL STATUS EXAM:  Mr. [name] is alert.  His eye contact is  good.  His attention span is within normal limits.  His concentration is  within normal limits.  His affect is slightly anxious.  Mood is within  normal limits.  He is oriented to all spheres.  His memory is intact to  immediate recent and remote, except for the delirium blackout described.  Speech involves normal rate and prosody without dysarthria.  Thought  process is logical, coherent, goal-directed.  No looseness of  associations.  Language expression and comprehension are intact,  abstraction is intact.  Thought content:  No thoughts of harming  himself.  No thoughts of harming others, no delusions, no  hallucinations.  Insight is intact, judgment is within normal limits.   ASSESSMENT:  Axis I:  293.84, Anxiety disorder, not otherwise specified;  rule out tic disorder; 296.80, bipolar disorder not otherwise specified,  stable; polysubstance dependence; 293.00 delirium  resolved.  Axis II:  Deferred.  Axis III:  See general medical section above.  Axis IV:  General medical.  Axis V:  55.   Mr. [name] is not at risk to harm himself or others.  He agrees to call  emergency services immediately for any thoughts of harming himself,  thoughts of harming others, or other psychiatric emergency symptoms.  DISCUSSION:  Based upon Mr. Savastano's report and his medication regimen,  it appears that he entered into an anterograde amnestic period after he  took the 3 Vicodin.  At that point he was responding to pain without any  recollection that he had already taken Vicodin.   The undersigned provided ego supportive psychotherapy and education.  The undersigned reinforced 12-step principles and recommended that the  patient be admitted to an inpatient chemical dependency rehabilitation  program.  However, the patient declined and he is no longer committable  after recovering from his intoxicating overdose.   The patient does volunteer to continue 12-step groups and contact with  his sponsor in the 12-step program.   He wants to continue his psychotropic medication regimen, [facility] up  [facility]   Would reduce his outpatient Klonopin dosing, would also not prescribe  any opioid medication.  The social worker can help set the patient up  with his outpatient care, rescheduling with his [facility] mental [facility].      Antonietta Breach, M.D.  Electronically Signed     JW/MEDQ  D:  06/02/2007  T:  06/03/2007  Job:  [id number]

## 2010-12-18 NOTE — Op Note (Signed)
NAMEADAMS, HINCH             ACCOUNT NO.:  [id number]   MEDICAL RECORD NO.:  [id number]          PATIENT TYPE:  OIB   LOCATION:  1614                         [facility]ACILITY:  [facility]   PHYSICIAN:  Almedia Balls. Ranell Patrick, M.D. DATE OF BIRTH:  [birth year]   DATE OF PROCEDURE:  01/12/2007  DATE OF DISCHARGE:                               OPERATIVE REPORT   PREOPERATIVE DIAGNOSIS:  Right shoulder avascular necrosis.   POSTOPERATIVE DIAGNOSIS:  Right shoulder avascular necrosis.   PROCEDURE PERFORMED:  Right shoulder arthroplasty using DePuy global cap  system.   SURGEON:  Almedia Balls. Ranell Patrick, M.D.   ASSISTANT:  Donnie Coffin. Durwin Nora, P.A.   ANESTHESIA:  General plus interscalene block.   ESTIMATED BLOOD LOSS:  Less than 100 cc.   FLUID REPLACEMENT:  1600 cc crystalloid.   INSTRUMENT COUNT:  Correct.   COMPLICATIONS:  None.   Perioperative antibiotics given.   INDICATIONS:  Patient is a 50 year old male with a history of avascular  necrosis in both hips and his shoulders.  The patient has undergone left  shoulder arthroplasty using a cap-type device in the remote past.  The  patient has done well with the left shoulder.  He presents now with  disabling pain in the right shoulder secondary to the collapsed segment  of AVN in his shoulder.  Patient presents now for operative treatment.  Informed consent has been obtained.   DESCRIPTION OF PROCEDURE:  After an adequate level of anesthesia was  achieved, the patient was positioned in modified beach-chair position.  All neurovascular structures were padded appropriately.  The right  shoulder was sterilely prepped and draped in the usual manner after  sterile prep and drape.  An examination under anesthesia revealed  external rotation out to 60-70 degrees.  We went ahead and performed a  deltopectoral approach starting at the coracoid process and extending  our incision down to the anterior insertion of the deltoid on the  anterior humerus.   Dissection was carried sharply down through the  subcutaneous tissues.  The systolic vein was identified and taken  laterally with the deltoid.  The pectoralis was taken medially.  The  conjoint tendon was identified and taken medially as well.  It revealed  the subscapularis, which had a large, robust tendon.  We went ahead and  divided this about 1 cm medial to its insertion on the lesser  tuberosity.  We freed up the subscap and had a nice balance, placed two  #2 Fiberwires in a modified Mason-Allen suture technique in the tendon  and retracted the subscapularis.  We then delivered the humeral head up  and into the wound.  We placed a T-handled Crego elevator around the  humeral head.  The humeral head with a collapsed central segment with a  flattened area and some scar tissue present in the middle of the head.  The socket was completely pristine.  We placed a Bennett retractor  immediately around the posterior medial portion of the humeral head.  We  had a 360 degree visual of the humeral head.  There were no significant  osteophytes to remove.  We identified the 12 o'clock, 6 o'clock, 3  o'clock, and 9 o'clock positions on this head.  Went ahead, then, and  sized it to a size 52 x 18. Went ahead and placed our drill guide and  then drilled our pin centrally right down the axis of the humeral head  and out the lateral cortex.  At this point, we went ahead and selected  our 52 x 18 reamer and reamed this down to good bleeding bone.  There  was an area in the central portion that had dead bone, but the majority  of the hemisphere was excellent cancellous bone.  We felt this would be  stable enough to support the cap.  We next punched out the central keel  area and then went ahead and placed our trial in place, which easily was  fully seated.  We did remove the excess bone around the periphery to  make sure that trial could be fully seated and the eventual plank could  be seated.  Once  we did that, we selected the real implant, removed the  threaded guidepin, and then inserted the global cap with sequential taps  with the mallet on the impactor.  This came fully seated down onto the  humerus, resurfacing the humerus nicely. This went right up to the edge  of the rotator cuff all the way around.  We reduced the shoulder, had a  nice translation, about 3/4 diameter posterior, about 1/2 diameter  inferior, and we thoroughly irrigated the shoulder and then repaired the  subscapularis to itself with the Fiberwire suture in the horizontal  mattress technique, and then over-sewed with multiple figure-of-eight  sutures, #2 Fiberwire, including the very upper corner and a little bit  of the rotator interval.  We had a nice repair of that, which could  easily externally rotate to 45 degrees without undue tension on the  humerus.  He had a nice, stable humerus once that was in, thoroughly  irrigated, and then closed the deltopectoral interval with 0 Vicryl  suture, followed by a 2-0 Vicryl subcutaneous and 4-0 Monocryl for the  skin.  Steri-Strips were applied, followed by a sterile dressing and a  shoulder immobilizer.  Patient tolerated the surgery well.      Almedia Balls. Ranell Patrick, M.D.  Electronically Signed     SRN/MEDQ  D:  01/12/2007  T:  01/12/2007  Job:  [id number]

## 2010-12-21 NOTE — Discharge Summary (Signed)
[facility]  Patient:    [name], [name] Visit Number: [id number] MRN: [id number]          Service Type: PSY Location: [facility] Attending Physician:  Rachael Fee Dictated by:   Haskel Khan, M.D. Admit Date:  05/12/2001 Discharge Date: 06/05/2001                             Discharge Summary  IDENTIFYING DATA:  This is a 50 year old, married, Caucasian male voluntarily admitted for detox from alcohol with a significant period of abstinence, relapsing in 1999, requiring inpatient detox in the past, admitting to cocaine use as well as infrequent marijuana use, describing a history of clear withdrawal symptoms.  MEDICATION:  Clonidine 0.2 b.i.d.  DRUG ALLERGIES:  COMPAZINE.  PHYSICAL EXAMINATION:  Essentially within normal limits except for an elevated blood pressure at 185/126 in the emergency room; given clonidine 0.2 at that time along with Ativan.  LABORATORY DATA:  Alcohol was less than 5.  CBC within normal limits.  Liver enzymes: SGOT 125 came down to 74, SGPT 121 came down to 95.  Electrolytes were within normal limits.  Otherwise labs were within normal limits.  MENTAL STATUS EXAMINATION:  He is a casually dressed, alert and oriented, Caucasian male.  Cooperative.  Mood depressed, dysphoric, anxious.  Affect restricted.  Thought process goal directed.  Thought content negative for psychotic symptoms and dangerous ideation.  Cognitively intact.  Judgment and insight poor based on history.  ADMITTING DIAGNOSES: Axis I:     1. Cocaine abuse.             2. Alcohol dependence.             3. Anxiety disorder, not otherwise specified.             4. Depression, not otherwise specified. Axis II:    None. Axis III:   1. Status post left shoulder replacement.             2. Hypertension.             3. Avascular necrosis. Axis IV:    Moderate; economic problems related to job loss and decreased             income. Axis V:      39/59.  HOSPITAL COURSE:  The patient was admitted, placed on routine p.r.n. medications, ordered individual, group, and milieu therapy and was started on a phenobarb detox protocol for safe detox with monitoring.  The patient was also started on Seroquel.  Liver function tests were followed, and he was started on Trileptal for mood lability, mood swings and Seroquel for agitation.  He described a clear history of mood lability with hypomanic symptoms and decreased restoration tolerance reporting a positive response to Trileptal without side effects, reporting motivation to follow up with [facility]. The patients wife was present at the time of discharge to secure medications.  CONDITION AT DISCHARGE:  Markedly improved.  No withdrawal symptom.  Mood mostly euthymic.  Affect bright.  Thought process goal directed.  Thought content negative for dangerous ideation or suicidal ideation, reporting motivation to be compliant with substance abuse therapy followup.  PLAN:  The patient was discharged on trazodone 100 mg q.h.s., Seroquel 100 mg one half q.a.m., 1 p.m., 6 p.m., and 2 q.h.s.  Trileptal 150 mg q.a.m., q.5. p.m. and two q.h.s.  Recommending to avoid alcohol and follow up with [facility]  staring Monday 05/11/01 at 4p.m.  DISCHARGE DIAGNOSES: Axis I:     1. Cocaine abuse.             2. Alcohol dependence.             3. Anxiety disorder, not otherwise specified.             4. Depression, not otherwise specified. Axis II:    None. Axis III:   1. Status post left shoulder replacement.             2. Hypertension.             3. Avascular necrosis. Axis IV:    Moderate, economic problems related to job loss and decreased             income. Axis V:     39/59; the patients Global Assessment of Functioning on discharge             was 55-60. Dictated by:   Haskel Khan, M.D. Attending Physician:  Rachael Fee DD:  06/24/01 TD:  06/26/01 Job: [id number]

## 2010-12-21 NOTE — Op Note (Signed)
[facility]  Patient:    [name], [name]                    MRN: [id number] Proc. Date: 01/16/00 Adm. Date:  [id number] Disc. Date: [id number] Attending:  Twana First                           Operative Report  PREOPERATIVE DIAGNOSIS:  Bilateral hip avascular necrosis.  POSTOPERATIVE DIAGNOSIS:  Bilateral hip avascular necrosis.  PROCEDURE: 1. Left total hip replacement using Osteonics total hip system with acetabulum    #58 mm press-fit _____ acetabulum with two locking screws and 10 degree    polyethylene liner, femoral component Secure Fit #9 size with +5 x 28 mm    femoral head and neck, with 127 degree angle. 2. Right hip core decompression.  SURGEON:  Elana Alm. Thurston Hole, M.D.  ASSISTANT:  Kirstin Adelberger, P.A.  ANESTHESIA:  General.  OPERATIVE TIME:  2-1/2 hours.  ESTIMATED BLOOD LOSS:  350 cc.  COMPLICATIONS:  None.  DESCRIPTION OF PROCEDURE:  [name] was brought to the operating room on January 16, 2000, placed on the fracture table in a supine position.  After an adequate level of general anesthesia was obtained, he had a Foley catheter placed under sterile conditions, and he received Ancef 1 g preoperatively for prophylaxis.  Both hips were examined under anesthesia.  He had flexion to 95 degrees bilaterally, extension to 0, internal and external rotation of 30 degrees.  Initially the right hip core decompression was performed.  The left leg was placed in an overhead suspension and padded well, and the right hip was prepped using sterile Betadine and draped using sterile technique. Initially through a 1 cm percutaneous-type incision, a K-wire was placed from the lateral femoral cortex under fluoroscopic control into the femoral neck and head, into the avascular areas that had been documented on the MRI.  Only one lateral cortex drill hole was made, but multiple channels from this drill hole were made in the areas of  the avascular regions in the femoral head under fluoroscopic control.  After this was done, then the pin was removed, the wound was irrigated and closed with interrupted nylon sutures.  At this point, he was taken out of traction and then turned with the left lateral up decubitus position.  He was padded well and was secured on the bed with a Mark frame.  The left hip was prepped using sterile Betadine and draped using sterile technique.  Initially through a 20 cm posterolateral greater trochanteric incision, initial exposure was made.  Then the subcutaneous tissues were incised in line with the skin incision.  In the subcutaneous tissues, there were some fatty-appearing nodules in almost like a bursal sac, but it was superficial to the bursal sac.  These nodules did not appear infected, but they were sent for pathologic review and were cultured, and a stat Gram stain was negative for any organisms, just some moderate white cells noted.  The iliotibial band and gluteus maximus fascia were incised longitudinally.  The sciatic nerve was carefully protected.  The short external rotators were released off their femoral neck insertion and tagged, and the hip capsule was opened.  Moderate joint fluid was also sent for stat Gram stain and culture, and the immediate Gram stains were negative for organisms.  The hip was posteriorly dislocated.  He was found to have significant delamination of  his articular cartilage over the central 50% of the femoral head.  A femoral neck cut was made 1.5-2 cm above the lesser trochanter in the appropriate amount of anteversion and inclination.  The acetabulum was exposed.  There were moderate grade 2 and some grade 3 changes in the acetabulum.  The acetabular labrum was resected.  The hip capsule was maintained.  Sequential acetabular reamers were then used to ream up to a 58 mm size and then the 58 trial was placed and found to be an excellent fit.  At this  point, the 58 mm _____ acetabular cup was hammered into position with the appropriate amount of anteversion and abduction.  It was an excellent press-fit.  It was further secured with two locking screws, one 20 mm, one 16 mm screw, in the 12 oclock and 1 oclock position.  The 10 degree polyethylene liner was then placed with the lip in the posterolateral position and hammered in and locked into the acetabular shell.  The proximal femur was then exposed.  Sequential axial reamers were used to ream up to a #9 size, followed by broaching up to a #9 size, and then distal reaming up to a 13.5 size.  The broach was left in position #9 with a +5, 28 mm femoral head and neck.  This was reduced, taken through a range of motion, and found to be stable up to 80-90 degrees of internal rotation in both neutral and 30 degrees of adduction and also stable in abduction and external rotation, and his leg lengths were found to be restored.  The trial was then dislocated posteriorly, the broach was removed, and then the #9 Secure Fit prosthesis was hammered into position with an excellent firm, tight fit.  The +5 x 28 mm femoral head was then hammered onto the femoral neck with an excellent Morse taper fit. The hip was then reduced, taken through a range of motion, found to be stable, leg lengths equal.  At this point, it was felt that all the components were of excellent size, fit, and stability.  The wound was irrigated with antibiotic solution.  The hip capsule and short external rotators were reattached to the femoral neck through two drill holes on the greater trochanter.  The iliotibial band and gluteus maximus fascia were closed with #1 Panacryl suture.  Subcutaneous tissues closed with 0 and 2-0 Vicryl, and skin closed with skin staples.  Sterile dressings were applied.  Patient then turned supine, abduction pillow placed.  He was awakened and taken to the recovery room in stable condition.  Needle and  sponge counts correct x 2 at the end of the case.  DD:  01/16/00 TD:  01/21/00 Job: [id number]

## 2010-12-21 NOTE — H&P (Signed)
[facility]  Patient:    [name], [name] Visit Number: [id number] MRN: [id number]          Service Type: PSY Location: 30 0301 02 Attending Physician:  Jeanice Lim Dictated by:   Young Berry Scott, N.P. Admit Date:  05/04/2001                     Psychiatric Admission Assessment  DATE OF ADMISSION:  May 04, 2001.  IDENTIFYING INFORMATION:  This is a 51 year old married Caucasian male who is voluntary admission for detox from alcohol.  HISTORY OF THE PRESENT ILLNESS: The patient reports his longest period of sobriety was from 20 to 1999, when he resumed drinking.  He was detoxed from alcohol last at [facility] in 2001 and remained sober for approximately 2 weeks after discharge.  At that time he resumed drinking which rapidly accelerated to drinking daily, now for many months.  Most recently, for the past 2 to 3 weeks, he has been drinking 6 to 9 beers per day, drinking daily.  The patient also reports that he used cocaine one time this month, and that he rarely smokes marijuana.  Patient reports that he has been stressed this year by being disabled secondary to his avascular necrosis and loss of his job this year, which in term precipitated increased financial problems for him and his wife.  The patient reports decreased sleep over the past couple of months, with vague thoughts of wishing he was not here, but no specific intent or plan for suicide.  The patient reports he has frequent panic attacks with rapid heart rate and feeling very anxious, but admits that he is usually either on alcohol or coming off when he gets these attacks.  He also reports that he has had nausea and shakes when he has tried to stop.  He denies any history of seizures.  He has had episodes of blackouts where he does not remember what happened when he was under the influence of alcohol.  PAST PSYCHIATRIC HISTORY:  The patient had been  previously referred to [facility] but never went after his last discharge from [facility].  The patient has a history of 2 prior inpatient admissions at [facility] in 2001 for detox and [facility]. This is his first admission to [facility].  The patient states that he was diagnosed with bipolar illness many years ago when he lived in [place], but has been off the medications for many years with no apparent problems or recurrence of those symptoms.  He does reports that he has a history of frequently becoming anxious and some history of impulsive spending.  The patients longest period of sobriety was from 1986 to 1999.  He has no history of prior suicide attempts.  SOCIAL HISTORY:  The patient has been married for the past 3 years.  He has no children.  This was his first marriage.  He had been disabled since January of 2002 secondary to his avascular necrosis and his hip pain, and he has had a left hip replacement.  The patient previously worked at [employer]. The patient has not worked since some time prior to his left shoulder replacement in July of 2002.  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  The patient has a history of 2 prior Driving Under the Influences and 1  current charge from April of 2002 with a court date of October 14.  The patients wife also drinks.  FAMILY HISTORY:  Positive for a father, a brother and 2 grandfathers with a history of alcohol abuse.  The patient has 4 siblings.  ALCOHOL AND DRUG HISTORY:  As already noted above.  PAST MEDICAL HISTORY:  The patient has no regular medical followup.  The last place he was seen was at the [facility] in [place] for pain problems.  Medical problems include history of hypertension, status post left shoulder replacement in July of 2002, and history of psoriasis.  Past  medical history is remarkable for hepatitis C, history of avascular necrosis, no history of seizures, positive history of blackouts.  MEDICATIONS:   Clonidine 0.2 mg b.i.d. which he was started on at [facility] but he has not taken this for many months.  DRUG ALLERGIES:  COMPAZINE.  POSITIVE PHYSICAL FINDINGS:  The patients physical examination was done in the [facility] Emergency Room where he was medically cleared.  In the emergency room his blood pressure was 185/126 and he was given Clonidine 0.2 mg at that time, along with Ativan 2 mg.  His alcohol level was less than 5. His CBC was within normal limits.  His liver enzymes:  SGOT was 125 and has come down as of early this morning to 74, and his SGPT was 121 and has come down to 95.  His electrolytes were within normal limits.  The patients weight on check-in was 165 pounds and the patient believes he has lost approximately 20 pounds in the last 1-2 months.  He is approximately 6 feet 3 inches tall.  MENTAL STATUS EXAMINATION:  This is a casually dressed and mildly anxious Caucasian male.  He is fully alert and cooperative.  His affect is anxious. He does have good eye contact.  His speech is slightly hyperverbal, spontaneous, but otherwise normal and relevant.  His mood is anxious and mildly depressed.  His thought process is logical and coherent.  No suicidal ideation, no homicidal ideation, no evidence of psychosis.  Cognitively, he is intact and oriented x 3.  ADMISSION DIAGNOSES: Axis I:    1. Cocaine abuse.            2. Ethyl alcohol abuse and dependence.            3. Rule out anxiety disorder not otherwise specified.            4. Rule out depression not otherwise specified. Axis II:   Deferred. Axis III:  Status post left shoulder replacement in July 2002, hypertension,            and avascular necrosis. Axis IV:   Moderate, economic problems related to loss of his job and            decreased  income. Axis V:    Current 39, past year 23.  INITIAL PLAN OF CARE:  Voluntarily admit the patient to detox him off alcohol  and to evaluate his mood disorder and his anxiety, with the goal being a safe detox and to be able to return him to be productive in the community.  We expect him to return to his own home and be fit to pursue returning to work. We will initiate a phenobarbital protocol and have given him a loading dose, with thiamine 100 mg daily in addition to phenobarbital 260 mg.  He will be on a tapering dose  over the next 4 days.  Initially, he was given Clonidine 0.2 mg b.i.d. for his hypertension; however, since his blood pressure has now come down to 136/85 early this morning, we will check his blood pressure q.i.d. and give him Clonidine 0.1 mg q.i.d. for blood pressures elevated over 150/90, and we will titrate that dose as needed.  Meanwhile, we will also get a urinalysis and a urine drug screen on him, enroll him in intensive individual and group therapy, and plan to establish some followup for him that will help reinforce his abstinence.  He is interested in becoming involved in AA and getting a sponsor, and we may also consider him for CDIOP.  ESTIMATED LENGTH OF STAY:  Four to five days. Dictated by:   Young Berry Scott, N.P. Attending Physician:  Jeanice Lim DD:  05/05/01 TD:  05/05/01 Job: [id number]

## 2010-12-21 NOTE — Op Note (Signed)
NAME:  [name], [name]                       ACCOUNT NO.:  [id number]   MEDICAL RECORD NO.:  [id number]                   PATIENT TYPE:  INP   LOCATION:  2853                                 FACILITY:  [facility]   PHYSICIAN:  Elana Alm. Thurston Hole, M.D.              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   PROCEDURE:  Right total hip replacement using Osteonic total hip system with  acetabulum 58 mm PSL Trident Pressfit acetabulum with 2 locking screws and  10-degree polyethelene liner.  Femoral component #9 secure fit plus with +0  x 36 mm ceramic femoral head and 13 mm distal tip.   SURGEON:  Elana Alm. Thurston Hole, M.D.   ASSISTANT:  Julien Girt, P.A.   ANESTHESIA:  General.   OPERATIVE TIME:  About 1 hour and 20 minutes.  Estimated blood loss 400 cc.   COMPLICATIONS:  None.   DESCRIPTION OF PROCEDURE:  [name] is brought to the operating room on  02/07/2003 and placed on the operative table in the supine position.  After  an adequate level of general anesthesia was obtained.  He had a Foley  catheter placed under sterile conditions and received Ancef 2 gm IV  preoperatively for prophylaxis.  His right hip was examined under  anesthesia.  He had flexion to 90, extension to 0, internal and external  rotation of 30 degrees with approximately 1 inch of shortening in the right  leg compared to the left.  He was then placed in the right lateral decubitus  position and secured on the bed with a Mark frame.   His right hip and leg was then prepped using sterile duraprep and draped  using sterile technique.  Originally through a 15-cm, posterolateral,  greater trochanteric incision initial exposure was made.  The underlying  subcutaneous tissues were incised with a long skin incision.  The iliotibial  vein and gluteus maximus fascia was incised longitudinally revealing the  underlying sciatic nerve which was carefully protected.  Short external  rotators to the hip and hip capsule were released off the femoral neck  insertion and tagged.  The hip was then posteriorly dislocated.  The femoral  neck cut was made 2 cm above the lesser trochanter in the appropriate amount  of abduction and anteversion and equitation.  The femoral head was removed.  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   The acetabulum was carefully exposed.  Grade 2 and 3 chondromalacia noted.  Carefully placed retractors were used and then labile tissue was resected.  Sequential acetabular reaming was then carried out to a 58 mm size and then  a 58 mm trial was  placed giving an excellent fit.  This was then removed and  the actual 58 mm PSL Trident cup was hammered into position in the  appropriate manner of anteversion and AB duction with an excellent Pressfit.  It was further secured into place with 2 separate 20-mm locking screws one  in the 12 and one in the 11 o'clock position.  After this was done and the  10 degree polyethelene liner was placed and locked into the acetabular shell  with a 10 degree lip in the posterolateral position.  After this was done,  the proximal femur was exposed.  Sequential reaming was carried out up to a  #9 size followed by broaches to a #9 size and with a #9 broach in place and  a +0 by 36 mm femoral head trial the hip was reduced, taken through a full  range of motion and found to be stable up to 80 to 90 degrees of internal  rotation in both neutral and 30 degrees of adduction and also stable in  abduction and external rotation and leg lengths were found to be  significantly improved and equalized.   At this point, then, the hip was dislocated.  The femoral component  trial  was then removed.  Distal reaming was then carried out to a 13.5 mm size.  After this was done the actual #9 secure fit plus prosthesis with a 13 mm  tip was hammered into position with an excellent Pressfit.  A +0 x 36 mm  femoral head was then placed and ceramic head was then placed and hammered  onto the femoral neck with an excellent Morse taper fit.  After the femoral  head was placed then the hip was reduced, again taken through a full range  of motion and found to be stable and leg lengths equalized.  At his point it  is felt that all the components were of excellent size and stability.  Further antibiotic irrigation was used and then the hip capsule and short  external rotators of the hip were reattached to the femoral neck insertion  through 2 drill holes in the greater trochanter.  The iliotibial band and  gluteus maximus fascia was closed with #1 Ethibond suture.  Subcutaneous  tissue was closed with #0 and 2-0 Vicryl.  The skin was closed with skin  staples.  Sterile dressings were applied.  The patient was then turned  supine.  Checked for leg lengths which were equal, rotation was equal.  Neurovascular status was normal.  He was then awakened, extubated and taken  to the recovery room in stable condition.  Needle and sponge count was  correct x2 at the end of the case.                                               Robert A. Thurston Hole, M.D.    RAW/MEDQ  D:  02/07/2003  T:  02/07/2003  Job:  [id number]

## 2010-12-21 NOTE — Discharge Summary (Signed)
[facility]  Patient:    [name], [name]                    MRN: [id number] Adm. Date:  [id number] Disc. Date: [id number] Attending:  Cathren Laine Dictator:   Kirstin Adelberger, P.A.                           Discharge Summary  ADMISSION DIAGNOSIS:  Bilateral hip avascular necrosis, left greater than right.  DISCHARGE DIAGNOSES: 1. Bilateral hip avascular necrosis, status post left total hip replacement,    status post core decompression on the right. 2. Hepatitis. 3. Bipolar disease. 4. Fever of unknown origin. 5. Hypertension.  HISTORY OF PRESENT ILLNESS:  The patient is a 50 year old white male with a long term history of alcohol abuse and tobacco abuse who presented in our office with stage IV avascular necrosis on the left, stage II avascular necrosis on the right.  At this point in time, he has pain uncontrollable by significant narcotics.  He ambulates daily with pain, pain at rest, pain at night.  He is unable to deal with activities of daily living secondary to his pain.  PROCEDURES IN HOUSE:  On January 16, 2000, the patient underwent a left total hip replacement, right core decompression and tolerated the procedure well.  HOSPITAL COURSE:  On postoperative day #1, the patient had a temperature maximum of 100.3.  The patient was found to be smoking in his room.  Pain was under control with his PCA.  He had a hemoglobin of 14.  On postoperative day #2, temperature maximum was 102.  Blood cultures were drawn.  Hemoglobin was 12.8.  INR was 1.3.  Oxygen saturations were 93% on room air.  We had a long discussion with the patient about smoking.  We recommended that he discontinue smoking, use his incentive spirometry.  The patient was progressing well with physical therapy.  On postoperative day #3, the patient was under control on OxyContin 40 b.i.d. with Percocet for breakthrough. Temperature maximum was 101.8, spiked during the  night, afebrile in the morning.  He continued aggressive pulmonary toilet.  On postoperative day #4, the patient was once again instructed on the importance of not smoking as he was smoking in his room.  Pain was under control.  On postoperative day #5, temperature was down to 100.8.  The patient was discharged to home with home health and physical therapy.  MEDICATIONS ON DISCHARGE: 1. OxyContin 40 b.i.d. 2. OxyContin IR. 3. Coumadin.  ACTIVITY:  He was partial weight bearing on both lower extremities with very limited walking.  FOLLOW-UP:  He will follow up in the office in 7-10 days.  He will call if he has a fever above 101.5, chills or increased pain. DD:  02/04/00 TD:  02/04/00 Job: [id number]

## 2010-12-21 NOTE — Discharge Summary (Signed)
NAME:  ZALEN, SEQUEIRA                       ACCOUNT NO.:  [id number]   MEDICAL RECORD NO.:  [id number]                   PATIENT TYPE:  INP   LOCATION:  3025                                 FACILITY:  [facility]   PHYSICIAN:  Madaline Guthrie, M.D.                 DATE OF BIRTH:  [birth year]   DATE OF ADMISSION:  11/18/2003  DATE OF DISCHARGE:  11/28/2003                                 DISCHARGE SUMMARY   DISCHARGE DIAGNOSES:  1. Lithium toxicity.  2. Psoriasis.  3. Bipolar disorder.  4. Hypertension.  5. Hepatitis C, chronic.  6. History of drug addition.  7. History of ethyl alcohol abuse.  8. History of prescription medication abuse.  9. Tobacco abuse, current smoker, two packs per day.   DISCHARGE MEDICATIONS:  1. Hydrochlorothiazide 25 mg daily.  2. Clonidine 0.2 mg t.i.d.  3. Lamictal 25 mg one q.h.s.  4. Seroquel 200 mg, one p.o. t.i.d.  5. Cutivate 0.005% ointment applied to immediate area p.r.n.  6. Thalidomide 50 mg, take two 50 mg tablets at bedtime.   DISCHARGE INSTRUCTIONS:  The patient is discharged in the care of his  father.  The patient is moving to [place].  He is to set up an  appointment with his dad's primary care physician in [place].  The  patient's new address is [address], [place], [place]  [address], phone  number [phone].  Alternate phone [phone].   HOSPITAL COURSE:  1. Mr. Summerson is a 50 year old bipolar male who was brought to the hospital     with altered mental status and inability to function.  On laboratory     evaluation, he was found had a lithium level of 2.39.  His lithium was     discontinued, and he was treated with supportive therapy, and ACE     inhibitor that the patient was previously on for hypertension was     discontinued as it was thought that they may be contributing to elevation     in the patient's lithium levels.  On November 23, 2003, Mr. Taranto     lithium level was measured at 0.25.  His mental status  greatly improved     during his stay as did his ability to function with activities of     physical therapy and occupational therapy.  On the day of discharge, Mr.     Schwager had returned to baseline.  2. Psoriasis.  Mr. Netherton has severe psoriasis.  His condition is followed     by [facility].  He currently on     Thalidomide  and Cutivate for his psoriasis.  He has responded     effectively to treatment.  3. Bipolar disorder.  Due to his recent lithium toxicity, lithium was     discontinued.  A psychiatric consultation was obtained with Dr. Antonietta Breach, and  Mr. Guilmette was started on an alternative psychiatric     medication which he responded well to.  He was discharged on 25 mg q.h.s.     of Lamictal and 200 mg b.i.d. Seroquel for treatment of his bipolar     disorder.  He is to follow up with mental health in [place] upon     discharge.  4. Hypertension.  ACE inhibitors were discontinued as was mentioned.  On     discharge, Mr. Wedemeyer was well controlled on a regimen of clonidine 0.2     mg b.i.d. and hydrochlorothiazide 25 mg daily.  5. Hepatitis C, chronic.  __________ level was measured during this hospital     stay and  __________ 161,096.  This will need to be followed up by his     primary-care physician or an infectious disease specialist or hepatitis/     GI specialist in [place].  6. History of drug addition.  Issues of addiction were addressed during Mr.     Kunkler's stay.  He is cognizant of his problem, and states that he has     been clean for a period of time, and does not wish to relapse.  It is     recommended that he follow up Alcohol and Drug counseling upon his     arrival in [place].  7. Tobacco abuse.  Mr. Sweetser is a current two packs per day smoker.     Tobacco cessation counseling was provided for Mr. Siefring during his     inpatient stay.  I am uncertain as to whether or not Mr. Stillings will      discontinue tobacco use on discharge.      Donald Pore, MD                          Madaline Guthrie, M.D.    HP/MEDQ  D:  01/26/2004  T:  01/28/2004  Job:  [id number]

## 2010-12-21 NOTE — Op Note (Signed)
[place]  Patient:    [name], [name]                    MRN: [id number] Proc. Date: 12/17/00 Adm. Date:  [id number] Attending:  Ronne Binning                           Operative Report  PREOPERATIVE DIAGNOSIS:  Comminuted intra-articular fracture  proximal interphalangeal joint right little finger.  POSTOPERATIVE DIAGNOSIS:  Comminuted intra-articular fracture  proximal interphalangeal joint right little finger.  OPERATION:  Closed manipulation, pinning, application of a traction pin right little finger.  SURGEON:  Nicki Reaper, M.D.  ASSISTANT:  Joaquin Courts, R.N.  ANESTHESIA:  Axillary, general.  DAY OF ADMISSION:  Dec 17, 2000  ANESTHESIOLOGIST:  Maren Beach, M.D.  HISTORY:  The patient is a 50 year old male with a history of a fracture of right little finger.  X-rays reveal a markedly comminuted fracture, along with CT scan, with at least four components of the articular surface.  PROCEDURE:  The patient was brought to the operating room where an axillary block was carried out without difficulty.  He continued to have some sensation; general anesthetic was carried.  He was prepped and draped using Betadine scrub and solution with the right arm free.  A 2.8 K-wire was then placed into the dorsal fracture fragment.  This was then manipulated into position and pinned to the volar fragments.  A 4.5 K-wire was then introduced just proximal to the condyles of the middle phalanx transversely for a traction device.  The pins were bent and cut short.  The manipulation showed the articular surface in good position.  A sterile compressive dressing and splint were applied.  The patient tolerated the procedure well.  He is discharged to return on Friday for the application of traction bow. DD:  12/17/00 TD:  12/17/00 Job: [id number]

## 2010-12-21 NOTE — Discharge Summary (Signed)
[name], [name]             ACCOUNT NO.:  [id number]   MEDICAL RECORD NO.:  [id number]          PATIENT TYPE:  INP   LOCATION:  3729                         FACILITY:  [facility]   PHYSICIAN:  Lollie Sails, MD      DATE OF BIRTH:  [birth year]   DATE OF ADMISSION:  08/04/2007  DATE OF DISCHARGE:  08/05/2007                               DISCHARGE SUMMARY   DISCHARGE DIAGNOSES:  1. Overdose on Klonopin.  2. History of polysubstance abuse with alcohol, smoking, and      prescription drugs.  3. Bipolar affective disorder.  4. Psoriasis.  5. Erectile dysfunction.  6. Hepatitis C.  7. Hypertension.  8. Tic disorder, not otherwise specified.  9. Chronic right shoulder pain.  10.Peripheral neuropathy.  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  12.Status post appendectomy.  13.Status post left shoulder joint replacement.   DISCHARGE MEDICATIONS:  1. Lamictal 200 mg by mouth twice daily.  2. Clonidine 0.3 mg p.o. twice daily.  3. Atenolol 100 mg by mouth twice daily.  4. Lithium 600 mg p.o. once daily.  5. Enbrel.  6. Seroquel 200 mg by mouth in the morning, 200 mg in the afternoon,      and 400 mg at night.  7. Klonopin 1 mg by mouth twice daily.   HOSPITAL FOLLOWUP:  The patient will follow up with Dr. [name] in  regards to his overdose with Klonopin.  The patient currently denies  suicidal or homicidal ideation.  It appears the patient overdose of  Klonopin is a part of his ongoing problem with prescription drug abuse.  The patient followed up with [facility].  He will also follow with Dr.  [name] within 1 week.  He needs to have his mental status followed up  on in addition to having his benzodiazepines tapered.   CONSULTATIONS:  None.   PROCEDURE PERFORMED:  CT of the head without contrast performed on  August 14, 2007, indicating no acute intracranial  abnormalities.  Atrophic changes advanced for the patient's age including cortical  atrophic changes to the frontal lobes and mild cerebellar atrophy.   HISTORY OF PRESENT ILLNESS:  This is a 50 year old man with a history of  prior prescription drug abuse, bipolar affective disorder, alcohol  abuse, tobacco abuse, hepatitis C, hypertension, who presented to the ED  secondary to lethargy and apparent benzodiazepine overdose.  The patient  initially presented to [facility] with lethargy and slurring in his  speech.  He reported that in the morning of admission, he took four 2 mg  Klonopin pills because he enjoys the high.  The patient is still  somewhat lethargic and slurring his speech, but he is alert and oriented  x3 and responds appropriately to questions.  He adamantly denied any  suicidal ideation.  He had no complaints of any chest pain, headaches,  blurry vision, dizziness, neurologic changes, abdominal pain, nausea,  vomiting, and diaphoresis.  He also denies any shortness of breath.  ADMITTING VITALS:  Temperature 97, blood pressure 118/80, pulse 59,  respirations 80, sating at 96 % on room air.   ADMITTING PHYSICAL:  GENERAL:  He is somewhat lethargic, no acute  distress, with some slurring of speech.  EYES:  Pupils, equally round, and reactive to light, anicteric.  No  nystagmus.  Extraocular eye movements intact.  ENT:  Mucous membrane is moist.  There is poor dentition.  NECK:  Supple.  No carotid bruit.  RESPIRATORY:  Lungs are clear bilaterally.  No wheezing, rales, or  rhonchi.  CARDIOVASCULAR:  S1, S2, regular rhythm.  No murmurs.  Pulse rate is  mildly bradycardic.  GI:  Soft, nontender, nondistended, with positive bowel sounds.  EXTREMITIES:  No edema and no cyanosis.  SKIN:  There is no jaundice.  No visible erythema.  No other rashes.  NEURO:  Cranial nerves II to XII grossly intact.  Motor function intact,  was 5/5 in all extremities.  Sensation intact to  light touch.  Cerebella  intact.  DTRs normal bilateral.  PSYCH:  Appropriate.   ADMITTING LABS:  Sodium 138, potassium 4.7, chloride 102, bicarb 30,  BUN 17, creatinine 1.08, glucose 107, alcohol was 0.5.  UDS positive for  benzos.   HOSPITAL COURSE/PROBLEM:  1. Overdose with benzodiazepines.  The patient has a long history of      abuse of prescription medications including narcotics and prior      abuse of  benzodiazepines.  He has had overdoses in the past      according to his father.  The patient denied any suicidal      ideations.  He currently does not appear to be depressed.      According to his father, he would like the patient to be weaned off      of some of his medications given his prior history of overdose.  We      agree with that assessment and we will provide the patient 7 days      of benzodiazepines and slowly taper his dose over that time and he      will see Dr. [name]  following his discharge.  The patient's      lithium, acetaminophen, alcohol, as well as folate levels were all      normal. The patient will also need counseling regards to his      substance abuse and further workup for possibility of depression.      It is possible that the patient is simply denying any desires to      harm himself in order to get out of the hospital.  He had a couple      episodes of drug overdose including an episode earlier 1 month ago      according to his father where he took 81 of Vicodin and was found      unconscious.  2. Hypertension.  The patient's blood pressure remains stable      throughout recent admission.  We held his antihypertensives      initially because of his drug overdose.  We restarted those upon      discharge.  He will need a follow up on a outpatient basis in      regards to  this chronic problem.  3. Bipolar disorder - The patient did not appear to be acutely maniac      during his hospitalization.  We maintained      his medications for bipolar  disorder  including lithium and Lamictal      when he was discharged.  4. History of hepatitis C.  The patient had LFTs done that were all      within normal limits.  He does not require any treatment at this      time for his hepatitis C.      Lollie Sails, MD  Electronically Signed     CB/MEDQ  D:  09/23/2007  T:  09/24/2007  Job:  [id number]

## 2010-12-21 NOTE — Discharge Summary (Signed)
NAME:  [name], [name]                       ACCOUNT NO.:  [id number]   MEDICAL RECORD NO.:  [id number]                   PATIENT TYPE:  INP   LOCATION:  5034                                 FACILITY:  [facility]   PHYSICIAN:  Elana Alm. Thurston Hole, M.D.              DATE OF BIRTH:  1961/03/06   DATE OF ADMISSION:  02/07/2003  DATE OF DISCHARGE:  02/10/2003                                 DISCHARGE SUMMARY   ADMISSION DIAGNOSES:  1. Avascular necrosis, right hip.  2. Psoriasis.  3. Bipolar disease.  4. Hepatitis C.  5. Hypertension.   DISCHARGE DIAGNOSES:  1. Avascular necrosis, right hip.  2. Psoriasis.  3. Avascular necrosis.  4. Bipolar disease.  5. Hypertension.  6. Hepatitis C.   HISTORY OF PRESENT ILLNESS:  Patient is a 50 year old white male with a  history of avascular necrosis of both hips.  He underwent a left total hip  replacement two years ago and a core decompression on the right hip.  He  continues to have pain and discomfort in his right hip.  He understands the  risks, benefits, and possible complications of a total hip replacement and  is without question.   PROCEDURES IN HOUSE:  On February 07, 2003, the patient underwent a right total  hip replacement by Dr. Thurston Hole.   HOSPITAL COURSE:  Postoperatively, he was admitted for pain control, DVT  prophylaxis, and physical therapy.  On postop day one, the patient was doing  well, he was sitting up on the side of the bed, T-max was 100.3, hemoglobin  was 12.3, surgical wound was well approximated, his INR was 1.4.  Patient  requested keeping his catheter 24 more hours.  His PCA was DC'd.  He was up  with physical therapy, was 1+ max assist.   On postop day two, T-max of 101, hemoglobin was 11.4, INR was 2.4, surgical  wound was well approximated.  He was started on Keflex 500 mg one p.o.  q.i.d. due to his psoriasis and his temperature.  His Foley was DC'd.  He  was touch-down weightbearing, up with physical  therapy.   On postop day three, patient progressed very well with physical therapy; he  was touch-down weightbearing.  His hemoglobin was 11.3.  His INR was 1.8.  He was metabolically stable.  He was discharged to home in stable condition.   ACTIVITY:  Touch-down weightbearing.   DIET:  A regular diet.   DISCHARGE MEDICATIONS:  1. Percocet 5/325 mg one to two q.4-6h. p.r.n. pain.  2. Robaxin 500 mg one q.6-8h. p.r.n. spasm.  3. Coumadin 5 mg one p.o. daily.  4. Keflex 500 mg one p.o. q.i.d. for 10 days.  5. Colace 100 mg one p.o. b.i.d.  6. Senokot-S two tablets with dinner.   DISCHARGE INSTRUCTIONS:  He has been instructed to call with increased pain  or a temp greater than 101.   FOLLOW UP:  He will follow up on February 21, 2003, for a wound check and x-  rays.   CONDITION ON DISCHARGE:  He is being discharged to home in stable condition.      Kirstin Shepperson, P.A.                  Robert A. Thurston Hole, M.D.    KS/MEDQ  D:  03/02/2003  T:  03/03/2003  Job:  [id number]

## 2011-04-25 LAB — I-STAT 8, (EC8 V) (CONVERTED LAB)
Bicarbonate: 27.3 — ABNORMAL HIGH
HCT: 47
Potassium: 4.4
Sodium: 141
TCO2: 29
pH, Ven: 7.325 — ABNORMAL HIGH

## 2011-04-25 LAB — DIFFERENTIAL
Lymphocytes Relative: 21
Lymphs Abs: 1.4
Neutrophils Relative %: 75

## 2011-04-25 LAB — TRICYCLICS SCREEN, URINE: TCA Scrn: POSITIVE — AB

## 2011-04-25 LAB — CBC
Platelets: 156
WBC: 6.8

## 2011-04-25 LAB — RAPID URINE DRUG SCREEN, HOSP PERFORMED
Amphetamines: NOT DETECTED
Cocaine: NOT DETECTED
Tetrahydrocannabinol: NOT DETECTED

## 2011-04-25 LAB — ETHANOL: Alcohol, Ethyl (B): <5

## 2011-04-25 LAB — SALICYLATE LEVEL: Salicylate Lvl: <4

## 2011-04-25 LAB — POCT I-STAT CREATININE: Operator id: 161631

## 2011-04-25 LAB — LITHIUM LEVEL: Lithium Lvl: 0.46 — ABNORMAL LOW

## 2011-04-25 LAB — ACETAMINOPHEN LEVEL: Acetaminophen (Tylenol), Serum: <10 — ABNORMAL LOW

## 2011-05-10 LAB — TROPONIN I: Troponin I: 0.04

## 2011-05-10 LAB — CBC
HCT: 43.7
HCT: 45.5
Hemoglobin: 14.1
Hemoglobin: 15
MCHC: 32.9
MCHC: 33.7
MCV: 97.5
Platelets: 205
RBC: 4.34
RBC: 4.66
RDW: 13.6
WBC: 6.7
WBC: 6.8

## 2011-05-10 LAB — COMPREHENSIVE METABOLIC PANEL
ALT: 35
ALT: 41
Albumin: 3.7
Alkaline Phosphatase: 92
BUN: 11
CO2: 30
Calcium: 9.6
Calcium: 9.9
GFR calc non Af Amer: >60
Glucose, Bld: 76
Glucose, Bld: 85
Potassium: 4.5
Sodium: 138
Sodium: 139
Total Protein: 6.9
Total Protein: 7.9

## 2011-05-10 LAB — OSMOLALITY: Osmolality: 293

## 2011-05-10 LAB — ACETAMINOPHEN LEVEL: Acetaminophen (Tylenol), Serum: <10 — ABNORMAL LOW

## 2011-05-10 LAB — DIFFERENTIAL
Basophils Absolute: 0
Eosinophils Absolute: 0.1
Eosinophils Relative: 1
Lymphocytes Relative: 20
Lymphs Abs: 2
Lymphs Abs: 2.6
Monocytes Absolute: 0.4
Monocytes Absolute: 0.7
Monocytes Relative: 6
Monocytes Relative: 7
Neutro Abs: 3.6
Neutro Abs: 7.3
Neutrophils Relative %: 53

## 2011-05-10 LAB — URINALYSIS, ROUTINE W REFLEX MICROSCOPIC
Nitrite: NEGATIVE
Specific Gravity, Urine: 1.017
Urobilinogen, UA: 0.2
pH: 7

## 2011-05-10 LAB — PROTIME-INR
INR: 1
Prothrombin Time: 13.5

## 2011-05-10 LAB — AMYLASE: Amylase: 107

## 2011-05-10 LAB — ETHANOL: Alcohol, Ethyl (B): <5

## 2011-05-10 LAB — POCT I-STAT 3, ART BLOOD GAS (G3+)
Acid-Base Excess: 3 — ABNORMAL HIGH
O2 Saturation: 89
Patient temperature: 98.7
TCO2: 29

## 2011-05-10 LAB — BASIC METABOLIC PANEL
BUN: 14
Calcium: 9.9
Calcium: 9.4
Chloride: 104
Creatinine, Ser: 0.92
GFR calc Af Amer: >60
GFR calc non Af Amer: >60
GFR calc non Af Amer: >60
Potassium: 4.7
Sodium: 138

## 2011-05-10 LAB — TRICYCLIC ANTIDEPRESSANT EVALUATION

## 2011-05-10 LAB — SALICYLATE LEVEL: Salicylate Lvl: <4

## 2011-05-10 LAB — RAPID URINE DRUG SCREEN, HOSP PERFORMED
Cocaine: NOT DETECTED
Cocaine: NOT DETECTED
Opiates: NOT DETECTED

## 2011-05-10 LAB — CK TOTAL AND CKMB (NOT AT ARMC)
CK, MB: 2.5
Relative Index: 2
Total CK: 128

## 2011-05-10 LAB — CARDIAC PANEL(CRET KIN+CKTOT+MB+TROPI): Troponin I: 0.02

## 2011-05-15 LAB — DIFFERENTIAL
Eosinophils Absolute: 0.1
Lymphs Abs: 1.7
Monocytes Absolute: 0.3
Monocytes Relative: 6
Neutrophils Relative %: 59

## 2011-05-15 LAB — CBC
HCT: 41.3
Hemoglobin: 12.3 — ABNORMAL LOW
MCHC: 33.4
MCV: 97.1
Platelets: 159
RDW: 13.5
WBC: 5.1

## 2011-05-15 LAB — RAPID URINE DRUG SCREEN, HOSP PERFORMED: Tetrahydrocannabinol: NOT DETECTED

## 2011-05-15 LAB — URINALYSIS, ROUTINE W REFLEX MICROSCOPIC
Bilirubin Urine: NEGATIVE
Ketones, ur: NEGATIVE
Nitrite: NEGATIVE
Specific Gravity, Urine: 1.025
Urobilinogen, UA: 0.2

## 2011-05-15 LAB — PROTIME-INR
INR: 1
INR: 1.1
Prothrombin Time: 13.4

## 2011-05-15 LAB — COMPREHENSIVE METABOLIC PANEL
ALT: 30
ALT: 30
ALT: 33
AST: 27
Albumin: 3.8
Albumin: 4.5
Alkaline Phosphatase: 77
BUN: 4 — ABNORMAL LOW
Calcium: 9
Calcium: 9.6
Chloride: 98
Creatinine, Ser: 0.71
GFR calc Af Amer: 57 — ABNORMAL LOW
GFR calc Af Amer: >60
Glucose, Bld: 85
Glucose, Bld: 97
Potassium: 3.7
Potassium: 4.3
Sodium: 138
Sodium: 142
Total Bilirubin: 0.8
Total Protein: 5.7 — ABNORMAL LOW
Total Protein: 7.8

## 2011-05-15 LAB — ACETAMINOPHEN LEVEL: Acetaminophen (Tylenol), Serum: 31.5 — ABNORMAL HIGH

## 2011-05-15 LAB — BASIC METABOLIC PANEL
CO2: 26
Calcium: 8.5
Glucose, Bld: 130 — ABNORMAL HIGH
Sodium: 135

## 2011-05-15 LAB — HEPATIC FUNCTION PANEL
ALT: 26
Alkaline Phosphatase: 63
Bilirubin, Direct: <0.1

## 2011-05-15 LAB — SALICYLATE LEVEL: Salicylate Lvl: <4

## 2011-05-15 LAB — APTT: aPTT: 28

## 2011-05-23 LAB — CBC
HCT: 33.6 — ABNORMAL LOW
Hemoglobin: 12.9 — ABNORMAL LOW
MCHC: 33.7
Platelets: 117 — ABNORMAL LOW
RBC: 4.06 — ABNORMAL LOW
RDW: 16.2 — ABNORMAL HIGH
WBC: 3.8 — ABNORMAL LOW

## 2011-05-23 LAB — BASIC METABOLIC PANEL
BUN: 17
CO2: 29
Calcium: 9
GFR calc non Af Amer: >60
Glucose, Bld: 107 — ABNORMAL HIGH
Potassium: 4.4

## 2011-05-23 LAB — DIFFERENTIAL
Basophils Relative: 1
Eosinophils Absolute: 0
Eosinophils Relative: 1
Neutrophils Relative %: 46

## 2011-05-23 LAB — COMPREHENSIVE METABOLIC PANEL
ALT: 31
Alkaline Phosphatase: 58
CO2: 29
Chloride: 105
GFR calc non Af Amer: >60
Glucose, Bld: 115 — ABNORMAL HIGH
Potassium: 5
Sodium: 139

## 2011-05-23 LAB — PROTIME-INR
INR: 1
Prothrombin Time: 13.2

## 2011-06-18 ENCOUNTER — Other Ambulatory Visit: Payer: Self-pay | Admitting: *Deleted

## 2011-06-18 MED ORDER — CLONIDINE HCL 0.3 MG PO TABS: 0.3000 mg | ORAL_TABLET | Freq: Two times a day (BID) | ORAL | Status: DC

## 2011-11-29 ENCOUNTER — Inpatient Hospital Stay (HOSPITAL_COMMUNITY)
Admission: EM | Admit: 2011-11-29 | Discharge: 2011-12-01 | DRG: 092 | Disposition: A | Discharge status: Home Health | Payer: Medicare Other | Attending: Internal Medicine | Admitting: Internal Medicine

## 2011-11-29 ENCOUNTER — Emergency Department (HOSPITAL_COMMUNITY): Payer: Medicare Other

## 2011-11-29 ENCOUNTER — Encounter (HOSPITAL_COMMUNITY): Payer: Self-pay

## 2011-11-29 DIAGNOSIS — R443 Hallucinations, unspecified: Secondary | ICD-10-CM | POA: Diagnosis present

## 2011-11-29 DIAGNOSIS — T43205A Adverse effect of unspecified antidepressants, initial encounter: Secondary | ICD-10-CM | POA: Diagnosis present

## 2011-11-29 DIAGNOSIS — Y92009 Unspecified place in unspecified non-institutional (private) residence as the place of occurrence of the external cause: Secondary | ICD-10-CM

## 2011-11-29 DIAGNOSIS — F17200 Nicotine dependence, unspecified, uncomplicated: Secondary | ICD-10-CM | POA: Diagnosis present

## 2011-11-29 DIAGNOSIS — R569 Unspecified convulsions: Secondary | ICD-10-CM

## 2011-11-29 DIAGNOSIS — R531 Weakness: Secondary | ICD-10-CM

## 2011-11-29 DIAGNOSIS — I10 Essential (primary) hypertension: Secondary | ICD-10-CM | POA: Diagnosis present

## 2011-11-29 DIAGNOSIS — L408 Other psoriasis: Secondary | ICD-10-CM | POA: Diagnosis present

## 2011-11-29 DIAGNOSIS — W19XXXA Unspecified fall, initial encounter: Secondary | ICD-10-CM

## 2011-11-29 DIAGNOSIS — R51 Headache: Secondary | ICD-10-CM | POA: Diagnosis present

## 2011-11-29 DIAGNOSIS — F319 Bipolar disorder, unspecified: Secondary | ICD-10-CM | POA: Diagnosis present

## 2011-11-29 DIAGNOSIS — G259 Extrapyramidal and movement disorder, unspecified: Secondary | ICD-10-CM | POA: Diagnosis present

## 2011-11-29 DIAGNOSIS — E875 Hyperkalemia: Secondary | ICD-10-CM | POA: Diagnosis present

## 2011-11-29 DIAGNOSIS — R269 Unspecified abnormalities of gait and mobility: Secondary | ICD-10-CM | POA: Diagnosis present

## 2011-11-29 DIAGNOSIS — R259 Unspecified abnormal involuntary movements: Principal | ICD-10-CM | POA: Diagnosis present

## 2011-11-29 DIAGNOSIS — G609 Hereditary and idiopathic neuropathy, unspecified: Secondary | ICD-10-CM | POA: Diagnosis present

## 2011-11-29 DIAGNOSIS — W010XXA Fall on same level from slipping, tripping and stumbling without subsequent striking against object, initial encounter: Secondary | ICD-10-CM | POA: Diagnosis present

## 2011-11-29 LAB — COMPREHENSIVE METABOLIC PANEL
CO2: 28 mEq/L (ref 19–32)
Calcium: 10.7 mg/dL — ABNORMAL HIGH (ref 8.4–10.5)
Creatinine, Ser: 0.83 mg/dL (ref 0.50–1.35)
GFR calc Af Amer: >90 mL/min (ref >90)
GFR calc non Af Amer: >90 mL/min (ref >90)
Glucose, Bld: 95 mg/dL (ref 70–99)
Sodium: 132 mEq/L — ABNORMAL LOW (ref 135–145)
Total Protein: 6.9 g/dL (ref 6.0–8.3)

## 2011-11-29 LAB — AMMONIA: Ammonia: 40 umol/L (ref 11–60)

## 2011-11-29 LAB — DIFFERENTIAL
Basophils Absolute: 0.1 10*3/uL (ref 0.0–0.1)
Eosinophils Absolute: 0.1 10*3/uL (ref 0.0–0.7)
Eosinophils Relative: 1 % (ref 0–5)
Lymphocytes Relative: 32 % (ref 12–46)
Monocytes Absolute: 0.6 10*3/uL (ref 0.1–1.0)

## 2011-11-29 LAB — URINALYSIS, ROUTINE W REFLEX MICROSCOPIC
Leukocytes, UA: NEGATIVE
Nitrite: NEGATIVE
Specific Gravity, Urine: 1.012 (ref 1.005–1.030)
Urobilinogen, UA: 0.2 mg/dL (ref 0.0–1.0)
pH: 7.5 (ref 5.0–8.0)

## 2011-11-29 LAB — CBC
HCT: 41 % (ref 39.0–52.0)
MCH: 35.8 pg — ABNORMAL HIGH (ref 26.0–34.0)
MCV: 106.5 fL — ABNORMAL HIGH (ref 78.0–100.0)
Platelets: 93 10*3/uL — ABNORMAL LOW (ref 150–400)
RDW: 15 % (ref 11.5–15.5)
WBC: 5.8 10*3/uL (ref 4.0–10.5)

## 2011-11-29 LAB — LITHIUM LEVEL: Lithium Lvl: 1.28 mEq/L (ref 0.80–1.40)

## 2011-11-29 LAB — RAPID URINE DRUG SCREEN, HOSP PERFORMED
Benzodiazepines: NOT DETECTED
Cocaine: NOT DETECTED
Opiates: NOT DETECTED

## 2011-11-29 LAB — POCT I-STAT TROPONIN I: Troponin i, poc: 0 ng/mL (ref 0.00–0.08)

## 2011-11-29 LAB — CK TOTAL AND CKMB (NOT AT ARMC): Relative Index: INVALID (ref 0.0–2.5)

## 2011-11-29 MED ORDER — SODIUM CHLORIDE 0.9 % IV BOLUS (SEPSIS)
500.0000 mL | Freq: Once | INTRAVENOUS | Status: AC
  Administered 2011-11-29: 500 mL via INTRAVENOUS

## 2011-11-29 MED ORDER — LITHIUM CARBONATE 300 MG PO CAPS
600.0000 mg | ORAL_CAPSULE | Freq: Every evening | ORAL | Status: DC
  Administered 2011-11-29 – 2011-11-30 (×2): 600 mg via ORAL
  Filled 2011-11-29 (×3): qty 2

## 2011-11-29 MED ORDER — CLOBETASOL PROPIONATE 0.05 % EX SOLN: 1.0000 "application " | Freq: Two times a day (BID) | CUTANEOUS | Status: DC

## 2011-11-29 MED ORDER — SODIUM CHLORIDE 0.9 % IV SOLN
INTRAVENOUS | Status: DC
  Administered 2011-11-30 – 2011-12-01 (×3): via INTRAVENOUS

## 2011-11-29 MED ORDER — OXYCODONE HCL 5 MG PO TABS
5.0000 mg | ORAL_TABLET | Freq: Once | ORAL | Status: AC
  Administered 2011-11-29: 5 mg via ORAL
  Filled 2011-11-29: qty 1

## 2011-11-29 MED ORDER — ATENOLOL 100 MG PO TABS
100.0000 mg | ORAL_TABLET | Freq: Every day | ORAL | Status: DC
  Filled 2011-11-29: qty 1

## 2011-11-29 MED ORDER — TRAZODONE HCL 100 MG PO TABS
100.0000 mg | ORAL_TABLET | Freq: Every day | ORAL | Status: DC
  Administered 2011-11-29 – 2011-11-30 (×2): 100 mg via ORAL
  Filled 2011-11-29 (×3): qty 1

## 2011-11-29 MED ORDER — CLONIDINE HCL 0.3 MG PO TABS
0.3000 mg | ORAL_TABLET | Freq: Two times a day (BID) | ORAL | Status: DC
Start: 2011-11-29 — End: 2011-11-29
  Filled 2011-11-29: qty 1

## 2011-11-29 MED ORDER — IBUPROFEN 600 MG PO TABS
600.0000 mg | ORAL_TABLET | Freq: Three times a day (TID) | ORAL | Status: DC | PRN
  Administered 2011-11-29 – 2011-11-30 (×3): 600 mg via ORAL
  Filled 2011-11-29 (×3): qty 1

## 2011-11-29 MED ORDER — LAMOTRIGINE 150 MG PO TABS
300.0000 mg | ORAL_TABLET | Freq: Every day | ORAL | Status: DC
  Administered 2011-11-30 – 2011-12-01 (×2): 300 mg via ORAL
  Filled 2011-11-29 (×2): qty 2

## 2011-11-29 MED ORDER — LITHIUM CARBONATE 300 MG PO CAPS
1200.0000 mg | ORAL_CAPSULE | Freq: Every day | ORAL | Status: DC
  Administered 2011-11-30 – 2011-12-01 (×2): 1200 mg via ORAL
  Filled 2011-11-29 (×3): qty 4

## 2011-11-29 MED ORDER — ATENOLOL 25 MG PO TABS
25.0000 mg | ORAL_TABLET | Freq: Every day | ORAL | Status: DC
  Administered 2011-11-29 – 2011-12-01 (×3): 25 mg via ORAL
  Filled 2011-11-29 (×4): qty 1

## 2011-11-29 MED ORDER — CLONIDINE HCL 0.1 MG PO TABS
0.1000 mg | ORAL_TABLET | Freq: Two times a day (BID) | ORAL | Status: DC
  Administered 2011-11-29 – 2011-12-01 (×4): 0.1 mg via ORAL
  Filled 2011-11-29 (×5): qty 1

## 2011-11-29 MED ORDER — LAMOTRIGINE 200 MG PO TABS
200.0000 mg | ORAL_TABLET | Freq: Every day | ORAL | Status: DC
  Administered 2011-11-29 – 2011-11-30 (×2): 200 mg via ORAL
  Filled 2011-11-29 (×3): qty 1

## 2011-11-29 MED ORDER — LAMOTRIGINE 200 MG PO TABS
200.0000 mg | ORAL_TABLET | Freq: Two times a day (BID) | ORAL | Status: DC
  Filled 2011-11-29: qty 2

## 2011-11-29 MED ORDER — BUPROPION HCL ER (SR) 100 MG PO TB12
100.0000 mg | ORAL_TABLET | Freq: Every day | ORAL | Status: DC
  Administered 2011-11-29 – 2011-12-01 (×3): 100 mg via ORAL
  Filled 2011-11-29 (×3): qty 1

## 2011-11-29 MED ORDER — GABAPENTIN 300 MG PO CAPS
300.0000 mg | ORAL_CAPSULE | Freq: Three times a day (TID) | ORAL | Status: DC
  Administered 2011-11-29 – 2011-12-01 (×6): 300 mg via ORAL
  Filled 2011-11-29 (×8): qty 1

## 2011-11-29 NOTE — ED Notes (Signed)
BJY:NW29<FA> Expected date:<BR> Expected time:10:43 AM<BR> Means of arrival:<BR> Comments:<BR> M80 - 51yoM Tremors x2days, hallucinations, took extra wellbutrin?

## 2011-11-29 NOTE — Progress Notes (Signed)
PT's bp at 1820 was 155/100 HR was 55. 100mg  of atenolol was scheduled. MD was paged. New orders received. 100mg  dose was held, and Dr. Jomarie Longs prescribed 25mg  dose for this time. Will continue to monitor pt.

## 2011-11-29 NOTE — ED Notes (Addendum)
Per EMS, pt. Had tremors and hallucinations X 10 days. Wife called EMS today because of seizure type activity and fall. EMS reports no injuries. Wife states he became disoriented, fell, and arms and legs began jerking intermittently for 10 minutes.

## 2011-11-29 NOTE — ED Notes (Signed)
Pt. Ambulated well in the hall. He did not stumble and was steady on his feet.

## 2011-11-29 NOTE — ED Notes (Signed)
IV of NS started 1000cc up

## 2011-11-29 NOTE — ED Notes (Signed)
Report called to Karen RN

## 2011-11-29 NOTE — Progress Notes (Signed)
Provided pt with a list of internal medicine [employer] complete pcps within [place] code [id number] per pt request Left in his room His ED RN made aware

## 2011-11-29 NOTE — ED Provider Notes (Signed)
History     CSN: [id number]  Arrival date & time 11/29/11  1038   First MD Initiated Contact with Patient 11/29/11 1044      Chief Complaint  Patient presents with  . Fall    (Consider location/radiation/quality/duration/timing/severity/associated sxs/prior treatment) Patient is a 51 y.o. male presenting with seizures. The history is provided by the patient and the spouse.  Seizures  This is a new problem. The current episode started 1 to 2 hours ago. Associated symptoms include confusion and visual disturbances. Characteristics include rhythmic jerking and loss of consciousness. Characteristics do not include bowel incontinence, bladder incontinence or bit tongue. The episode was witnessed. There has been no fever.  Per wife, pt with bipolar. Was taken off prozac 10 days ago because was having hallucinations. States was started on wellbutrin. Pt has been having tremors and confusion as well as hallucinations, intermittent vision changes for the last 10 days. Today, per wife, pt began having jerking of entire body, and unresponsiveness. This lasted on and off for about 10 min followed by confusion. No bladder or bowel incontinence. No other complaints. Per wife, pt has been intermittently confused and hallucinating.  No other new medications, however states there are 6 wellbutrin pills missing from the bottle. Pt denies taking more than prescribed. States that he did smoke pot 2 days ago.   Past Medical History  Diagnosis Date  . Tachycardia   . Peripheral neuropathy   . Alcohol abuse     hx of quit 4/04  . Bilateral external ear infections 2007  . Tobacco abuse   . Bipolar affective disorder     Followed by Dr. [name]  . Avascular necrosis     SP right and left hip replacements and L shoulder arthroplasty, secondary to steroid use.  Has been on embrel in past.  . Psoriasis     Previously on embrel, stopped because of price.   . Hypertension   . Hepatitis C   . ARF (acute renal  failure) 2008    Multifactorial in setting of over use of ibuprofen and HCTZ./ACEI    Past Surgical History  Procedure Date  . Right total hip replacement 2004    Due to AVN  . Left total hip replacement 2001    Due to AVN  . Closed manipulation, pinning, application of a traction pin right 2002  . Right shoulder arthroplasty     Due to AVN  . Appendectomy 1989    Family History  Problem Relation Age of Onset  . Hypertension Mother   . Bipolar disorder Father   . Obesity Father   . Obesity Brother     History  Substance Use Topics  . Smoking status: Current Everyday Smoker  . Smokeless tobacco: Not on file   Comment: Not interested in stopping.  . Alcohol Use: No     Previously alcoholic, on and off but appears to has stopped since 12/09      Review of Systems  Constitutional: Negative for fever, chills and fatigue.  HENT: Negative.   Eyes: Positive for visual disturbance.  Respiratory: Negative for chest tightness, shortness of breath and wheezing.   Cardiovascular: Negative.   Gastrointestinal: Negative.  Negative for bowel incontinence.  Genitourinary: Negative for bladder incontinence.  Musculoskeletal: Negative.   Skin: Negative for rash.  Neurological: Positive for seizures, loss of consciousness and weakness.  Psychiatric/Behavioral: Positive for hallucinations and confusion.    Allergies  Erythromycin; Fentanyl citrate; Hydromorphone hcl; and Prochlorperazine maleate  Home  Medications   Current Outpatient Rx  Name Route Sig Dispense Refill  . ATENOLOL 100 MG PO TABS Oral Take 100 mg by mouth daily.      Marland Kitchen CLOBETASOL PROPIONATE 0.05 % EX SOLN Topical Apply topically 2 (two) times daily.      Marland Kitchen CLONIDINE HCL 0.3 MG PO TABS Oral Take 1 tablet (0.3 mg total) by mouth 2 (two) times daily. 60 tablet 5  . LAMOTRIGINE 200 MG PO TABS Oral Take by mouth. Take 1 and one-half tabs in the morning, and one tab at night.     Marland Kitchen LITHIUM CARBONATE 600 MG PO CAPS Oral  Take 600 mg by mouth. Take 1 tab by mouth at night and 1/2 tab in morning.     Marland Kitchen SILDENAFIL CITRATE 50 MG PO TABS Oral Take 50 mg by mouth daily as needed. Take 1 tab by mouth 1 hour prior to sexual activity.     . TRAZODONE HCL 100 MG PO TABS Oral Take 200 mg by mouth at bedtime.        BP 142/100  Pulse 60  Resp 14  Physical Exam  Nursing note and vitals reviewed. Constitutional: He is oriented to person, place, and time. He appears well-developed.       Thin appearing  HENT:  Head: Normocephalic and atraumatic.  Eyes: Conjunctivae and EOM are normal. Pupils are equal, round, and reactive to light.  Neck: Neck supple.  Cardiovascular: Normal rate, regular rhythm and normal heart sounds.   Pulmonary/Chest: Effort normal and breath sounds normal. No respiratory distress. He has no wheezes.  Abdominal: Soft. Bowel sounds are normal. He exhibits no distension. There is no tenderness.  Musculoskeletal: Normal range of motion.  Neurological: He is alert and oriented to person, place, and time.       Currently oriented x3, however, slow to respond. 5/5 and equal upper and lower extremity strength. Myoclonus of feet. Intentional tremor with finger to nose, but able to do it.   Skin: Skin is warm and dry.  Psychiatric: He has a normal mood and affect.    ED Course  Procedures (including critical care time)   Date: 11/29/2011  Rate: 55  Rhythm: sinus bradycardia  QRS Axis: left  Intervals: PR prolonged  ST/T Wave abnormalities: normal  Conduction Disutrbances:first-degree A-V block   Narrative Interpretation:   Old EKG Reviewed: changes noted new bradycardia, ECG from 05/01/10 showed HR of 67   Results for orders placed during the hospital encounter of 11/29/11  CBC      Component Value Range   WBC 5.8  4.0 - 10.5 (K/uL)   RBC 3.85 (*) 4.22 - 5.81 (MIL/uL)   Hemoglobin 13.8  13.0 - 17.0 (g/dL)   HCT 25.3  66.4 - 40.3 (%)   MCV 106.5 (*) 78.0 - 100.0 (fL)   MCH 35.8 (*) 26.0 -  34.0 (pg)   MCHC 33.7  30.0 - 36.0 (g/dL)   RDW 47.4  25.9 - 56.3 (%)   Platelets 93 (*) 150 - 400 (K/uL)  DIFFERENTIAL      Component Value Range   Neutrophils Relative 56  43 - 77 (%)   Neutro Abs 3.2  1.7 - 7.7 (K/uL)   Lymphocytes Relative 32  12 - 46 (%)   Lymphs Abs 1.9  0.7 - 4.0 (K/uL)   Monocytes Relative 11  3 - 12 (%)   Monocytes Absolute 0.6  0.1 - 1.0 (K/uL)   Eosinophils Relative 1  0 -  5 (%)   Eosinophils Absolute 0.1  0.0 - 0.7 (K/uL)   Basophils Relative 1  0 - 1 (%)   Basophils Absolute 0.1  0.0 - 0.1 (K/uL)  COMPREHENSIVE METABOLIC PANEL      Component Value Range   Sodium 132 (*) 135 - 145 (mEq/L)   Potassium 4.5  3.5 - 5.1 (mEq/L)   Chloride 99  96 - 112 (mEq/L)   CO2 28  19 - 32 (mEq/L)   Glucose, Bld 95  70 - 99 (mg/dL)   BUN 12  6 - 23 (mg/dL)   Creatinine, Ser 1.61  0.50 - 1.35 (mg/dL)   Calcium 09.6 (*) 8.4 - 10.5 (mg/dL)   Total Protein 6.9  6.0 - 8.3 (g/dL)   Albumin 3.6  3.5 - 5.2 (g/dL)   AST 61 (*) 0 - 37 (U/L)   ALT 74 (*) 0 - 53 (U/L)   Alkaline Phosphatase 93  39 - 117 (U/L)   Total Bilirubin 0.5  0.3 - 1.2 (mg/dL)   GFR calc non Af Amer >90  >90 (mL/min)   GFR calc Af Amer >90  >90 (mL/min)  URINALYSIS, ROUTINE W REFLEX MICROSCOPIC      Component Value Range   Color, Urine YELLOW  YELLOW    APPearance CLOUDY (*) CLEAR    Specific Gravity, Urine 1.012  1.005 - 1.030    pH 7.5  5.0 - 8.0    Glucose, UA NEGATIVE  NEGATIVE (mg/dL)   Hgb urine dipstick NEGATIVE  NEGATIVE    Bilirubin Urine NEGATIVE  NEGATIVE    Ketones, ur NEGATIVE  NEGATIVE (mg/dL)   Protein, ur NEGATIVE  NEGATIVE (mg/dL)   Urobilinogen, UA 0.2  0.0 - 1.0 (mg/dL)   Nitrite NEGATIVE  NEGATIVE    Leukocytes, UA NEGATIVE  NEGATIVE   CK TOTAL AND CKMB      Component Value Range   Total CK 36  7 - 232 (U/L)   CK, MB 1.6  0.3 - 4.0 (ng/mL)   Relative Index RELATIVE INDEX IS INVALID  0.0 - 2.5   POCT I-STAT TROPONIN I      Component Value Range   Troponin i, poc 0.00   0.00 - 0.08 (ng/mL)   Comment 3            Ct Head Wo Contrast  11/29/2011  *RADIOLOGY REPORT*  Clinical Data: 51 year old male with seizure-like activity.  Falls.  CT HEAD WITHOUT CONTRAST  Technique:  Contiguous axial images were obtained from the base of the skull through the vertex without contrast.  Comparison: 05/01/2010 and earlier.  Findings: Visualized paranasal sinuses and mastoids are clear.  No acute osseous abnormality identified.  Visualized orbits and scalp soft tissues are within normal limits.  Calcified atherosclerosis at the skull base.  No ventriculomegaly. No midline shift, mass effect, or evidence of mass lesion.  [name] white matter differentiation is within normal limits throughout the brain.  No evidence of cortically based acute infarction identified.  No acute intracranial hemorrhage identified.  No suspicious intracranial vascular hyperdensity.  IMPRESSION: Stable and normal noncontrast CT appearance of the brain.  Original Report Authenticated By: Ulla Potash III, M.D.    1:48 PM Pt was ambulated, unable to walk. Legs shake and he falls down. VS normal. He has had no more seizure episodes while here. Possible reaction to wellbutrin. Pt's wife states pt may have taken few more pills than prescribed. I spoke with [facility] for admission and further evaluation, they will admit.  Spoke with neurology, will come consult.    1. Weakness   2. Fall   3. Seizure       MDM         Lottie Mussel, PA 11/29/11 1940

## 2011-11-29 NOTE — Progress Notes (Signed)
ED Cm noted no pcp listed for pt He states he had been seeing a Dr Kathrynn Running but would like to change pcp

## 2011-11-29 NOTE — Consult Note (Signed)
[facility] CONSULT NOTE     Reason for Consult: tremor and weakness of leg    HPI:    [name] is an 51 y.o. male who has been on tegretol and lithium for a long duration.  About two months ago prozac was added. After about one month he was significantly drowsy and this was stopped. Wellbutrin 200 mg BID (per wife) was started.  Today patient was noted to have increasing tremor and gait instability.  Patient was brought to the ED and neurology was consulted.   Past Medical History  Diagnosis Date  . Tachycardia   . Peripheral neuropathy   . Alcohol abuse     hx of quit 4/04  . Bilateral external ear infections 2007  . Tobacco abuse   . Bipolar affective disorder     Followed by Dr. [name]  . Avascular necrosis     SP right and left hip replacements and L shoulder arthroplasty, secondary to steroid use.  Has been on embrel in past.  . Psoriasis     Previously on embrel, stopped because of price.   . Hypertension   . Hepatitis C   . ARF (acute renal failure) 2008    Multifactorial in setting of over use of ibuprofen and HCTZ./ACEI    Past Surgical History  Procedure Date  . Right total hip replacement 2004    Due to AVN  . Left total hip replacement 2001    Due to AVN  . Closed manipulation, pinning, application of a traction pin right 2002  . Right shoulder arthroplasty     Due to AVN  . Appendectomy 1989    Family History  Problem Relation Age of Onset  . Hypertension Mother   . Bipolar disorder Father   . Obesity Father   . Obesity Brother     Social History:  reports that he has been smoking Cigarettes.  He has never used smokeless tobacco. He reports that he does not drink alcohol or use illicit drugs.  Allergies  Allergen Reactions  . Erythromycin     REACTION: GI upset  . Fentanyl Citrate     REACTION: itching  . Hydromorphone Hcl     REACTION: itching  . Prochlorperazine Maleate     REACTION: jaw lock     Medications:    Prior to Admission:  (Not in a hospital admission) Scheduled:   . sodium chloride  500 mL Intravenous Once    Review of Systems - General ROS: negative for - chills, fatigue, fever or hot flashes Hematological and Lymphatic ROS: negative for - bruising, fatigue, jaundice or pallor Endocrine ROS: negative for - hair pattern changes, hot flashes, mood swings or skin changes Respiratory ROS: negative for - cough, hemoptysis, orthopnea or wheezing Cardiovascular ROS: negative for - dyspnea on exertion, orthopnea, palpitations or shortness of breath Gastrointestinal ROS: negative for - abdominal pain, appetite loss, blood in stools, diarrhea or hematemesis Musculoskeletal ROS: negative for - joint pain, joint stiffness, joint swelling or muscle pain Neurological ROS: See HPIological ROS: negative for dry skin, pruritus and rash   Blood pressure 121/84, pulse 60, temperature 97.7 F (36.5 C), temperature source Oral, resp. rate 12, SpO2 99.00%.   Neurologic Examination:   Mental Status: Alert, oriented, thought content appropriate.  Speech fluent without evidence of aphasia. Able to follow 3 step commands without difficulty.  Cranial Nerves: II-Visual fields grossly intact. III/IV/VI-Extraocular movements intact.  Pupils reactive bilaterally. V/VII-Smile symmetric VIII-grossly intact IX/X-normal gag XI-bilateral shoulder shrug XII-midline tongue extension Motor: 5/5 bilaterally with normal tone and bulk, patient shows both postural and action tremor.  Sensory: Pinprick and light touch intact throughout, bilaterally Deep Tendon Reflexes: 2+ and symmetric throughout with decreased reflex on right knee compared to left.  Plantars: Downgoing bilaterally Cerebellar: Normal finger-to-nose, normal rapid alternating movements and normal heel-to-shin test. Weak gait.   Lab Results  Component Value Date/Time   CHOL  Value: 138        ATP III CLASSIFICATION:  <200      mg/dL   Desirable  191-478  mg/dL   Borderline High  >=295    mg/dL   High        01/23/3085  2:12 AM    Results for orders placed during the hospital encounter of 11/29/11 (from the past 48 hour(s))  CBC     Status: Abnormal   Collection Time   11/29/11 11:25 AM      Component Value Range Comment   WBC 5.8  4.0 - 10.5 (K/uL)    RBC 3.85 (*) 4.22 - 5.81 (MIL/uL)    Hemoglobin 13.8  13.0 - 17.0 (g/dL)    HCT 57.8  46.9 - 62.9 (%)    MCV 106.5 (*) 78.0 - 100.0 (fL)    MCH 35.8 (*) 26.0 - 34.0 (pg)    MCHC 33.7  30.0 - 36.0 (g/dL)    RDW 52.8  41.3 - 24.4 (%)    Platelets 93 (*) 150 - 400 (K/uL)   DIFFERENTIAL     Status: Normal   Collection Time   11/29/11 11:25 AM      Component Value Range Comment   Neutrophils Relative 56  43 - 77 (%)    Neutro Abs 3.2  1.7 - 7.7 (K/uL)    Lymphocytes Relative 32  12 - 46 (%)    Lymphs Abs 1.9  0.7 - 4.0 (K/uL)    Monocytes Relative 11  3 - 12 (%)    Monocytes Absolute 0.6  0.1 - 1.0 (K/uL)    Eosinophils Relative 1  0 - 5 (%)    Eosinophils Absolute 0.1  0.0 - 0.7 (K/uL)    Basophils Relative 1  0 - 1 (%)    Basophils Absolute 0.1  0.0 - 0.1 (K/uL)   COMPREHENSIVE METABOLIC PANEL     Status: Abnormal   Collection Time   11/29/11 11:25 AM      Component Value Range Comment   Sodium 132 (*) 135 - 145 (mEq/L)    Potassium 4.5  3.5 - 5.1 (mEq/L)    Chloride 99  96 - 112 (mEq/L)    CO2 28  19 - 32 (mEq/L)    Glucose, Bld 95  70 - 99 (mg/dL)    BUN 12  6 - 23 (mg/dL)    Creatinine, Ser 0.10  0.50 - 1.35 (mg/dL)    Calcium 27.2 (*) 8.4 - 10.5 (mg/dL)    Total Protein 6.9  6.0 - 8.3 (g/dL)    Albumin 3.6  3.5 - 5.2 (g/dL)    AST 61 (*) 0 - 37 (U/L)    ALT 74 (*) 0 - 53 (U/L)    Alkaline Phosphatase 93  39 - 117 (U/L)    Total Bilirubin 0.5  0.3 - 1.2 (mg/dL)    GFR calc non Af Amer >90  >90 (mL/min)  GFR calc Af Amer >90  >90 (mL/min)   CK TOTAL AND CKMB     Status: Normal   Collection Time   11/29/11 11:25 AM      Component Value Range  Comment   Total CK 36  7 - 232 (U/L)    CK, MB 1.6  0.3 - 4.0 (ng/mL)    Relative Index RELATIVE INDEX IS INVALID  0.0 - 2.5    POCT I-STAT TROPONIN I     Status: Normal   Collection Time   11/29/11 11:36 AM      Component Value Range Comment   Troponin i, poc 0.00  0.00 - 0.08 (ng/mL)    Comment 3            URINALYSIS, ROUTINE W REFLEX MICROSCOPIC     Status: Abnormal   Collection Time   11/29/11  1:19 PM      Component Value Range Comment   Color, Urine YELLOW  YELLOW     APPearance CLOUDY (*) CLEAR     Specific Gravity, Urine 1.012  1.005 - 1.030     pH 7.5  5.0 - 8.0     Glucose, UA NEGATIVE  NEGATIVE (mg/dL)    Hgb urine dipstick NEGATIVE  NEGATIVE     Bilirubin Urine NEGATIVE  NEGATIVE     Ketones, ur NEGATIVE  NEGATIVE (mg/dL)    Protein, ur NEGATIVE  NEGATIVE (mg/dL)    Urobilinogen, UA 0.2  0.0 - 1.0 (mg/dL)    Nitrite NEGATIVE  NEGATIVE     Leukocytes, UA NEGATIVE  NEGATIVE  MICROSCOPIC NOT DONE ON URINES WITH NEGATIVE PROTEIN, BLOOD, LEUKOCYTES, NITRITE, OR GLUCOSE <1000 mg/dL.  URINE RAPID DRUG SCREEN (HOSP PERFORMED)     Status: Normal   Collection Time   11/29/11  1:19 PM      Component Value Range Comment   Opiates NONE DETECTED  NONE DETECTED     Cocaine NONE DETECTED  NONE DETECTED     Benzodiazepines NONE DETECTED  NONE DETECTED     Amphetamines NONE DETECTED  NONE DETECTED     Tetrahydrocannabinol NONE DETECTED  NONE DETECTED     Barbiturates NONE DETECTED  NONE DETECTED      Ct Head Wo Contrast  11/29/2011  *RADIOLOGY REPORT*  Clinical Data: 51 year old male with seizure-like activity.  Falls.  CT HEAD WITHOUT CONTRAST  Technique:  Contiguous axial images were obtained from the base of the skull through the vertex without contrast.  Comparison: 05/01/2010 and earlier.  Findings: Visualized paranasal sinuses and mastoids are clear.  No acute osseous abnormality identified.  Visualized orbits and scalp soft tissues are within normal limits.  Calcified  atherosclerosis at the skull base.  No ventriculomegaly. No midline shift, mass effect, or evidence of mass lesion.  [name] white matter differentiation is within normal limits throughout the brain.  No evidence of cortically based acute infarction identified.  No acute intracranial hemorrhage identified.  No suspicious intracranial vascular hyperdensity.  IMPRESSION: Stable and normal noncontrast CT appearance of the brain.  Original Report Authenticated By: Harley Hallmark, M.D.     Assessment/Plan:   51 YO male who is on lithium, tegretol and recently placed on Wellbutrin 10 days ago. After starting Wellbutrin he noticed increased tremor, weakness in his legs and hallucinations.  No seizure activity was noted by wife.  Lithium level is pending at this time. CT head is negative.  Likely patient had a poor reaction to Wellbutrin.    Recommendation:  1) Safely taper off Wellbutrin per psychiatry.   2) Psychiatry consult for another antidepressant 3) Lithium level  Felicie Morn PA-C [facility] [phone]  11/29/2011, 2:02 PM

## 2011-11-29 NOTE — H&P (Signed)
PCP: Dr.Manning, [facility] Psychiatrist: Dr.Taylor, [facility]  Chief Complaint:  Seizure like activity  HPI: [name] is a 50/M with h/o bipolar disorder maintained on Lithium and Lamictal was started on Prozac 6weeks ago for depression this caused him to be sleepy and lethargic, subsequently this was discontinued on 4/16 and was started on Wellbutrin instead. Since starting wellbutrin wife reports intermittent confusion, hallucinations, double vision, abnormal involuntary movements with his legs buckling, unsteadiness. This am he woke up, his walk was noted as unsteady then he fell down, experienced some spasms and involuntary movements involving his lower extremities greater than uppers, then curled up in a fetal position. Pt and wife deny loss of consiousness. No history of headinjury, fevers/chills.  Allergies:   Allergies  Allergen Reactions  . Erythromycin     REACTION: GI upset  . Fentanyl Citrate     REACTION: itching  . Hydromorphone Hcl     REACTION: itching  . Prochlorperazine Maleate     REACTION: jaw lock      Past Medical History  Diagnosis Date  . Tachycardia   . Peripheral neuropathy   . Alcohol abuse     hx of quit 4/04  . Bilateral external ear infections 2007  . Tobacco abuse   . Bipolar affective disorder     Followed by Dr. [name]  . Avascular necrosis     SP right and left hip replacements and L shoulder arthroplasty, secondary to steroid use.  Has been on embrel in past.  . Psoriasis     Previously on embrel, stopped because of price.   . Hypertension   . Hepatitis C   . ARF (acute renal failure) 2008    Multifactorial in setting of over use of ibuprofen and HCTZ./ACEI    Past Surgical History  Procedure Date  . Right total hip replacement 2004    Due to AVN  . Left total hip replacement 2001    Due to AVN  . Closed manipulation, pinning, application of a traction pin right 2002  . Right shoulder arthroplasty     Due to AVN  .  Appendectomy 1989  Multiple diskectomies Spinal fusion Hernia repair Lysis of adhesions  Prior to Admission medications   Medication Sig Start Date End Date Taking? Authorizing Provider  atenolol (TENORMIN) 100 MG tablet Take 100 mg by mouth daily.     Yes Historical Provider, MD  buPROPion (WELLBUTRIN SR) 200 MG 12 hr tablet Take 200 mg by mouth daily.    Yes Historical Provider, MD  clobetasol (TEMOVATE) 0.05 % external solution Apply 1 application topically 2 (two) times daily. Applied to sensitive areas for psoriasis.   Yes Historical Provider, MD  cloNIDine (CATAPRES) 0.3 MG tablet Take 1 tablet (0.3 mg total) by mouth 2 (two) times daily. 06/18/11  Yes Darnelle Maffucci, MD  etanercept (ENBREL) 50 MG/ML injection Inject 25 mg into the skin 2 (two) times a week. Given on Tuesdays and Saturdays.   Yes Historical Provider, MD  gabapentin (NEURONTIN) 300 MG capsule Take 300 mg by mouth 3 (three) times daily.   Yes Historical Provider, MD  ibuprofen (ADVIL,MOTRIN) 200 MG tablet Take 800 mg by mouth every 8 (eight) hours as needed. For pain.   Yes Historical Provider, MD  lamoTRIgine (LAMICTAL) 200 MG tablet Take 200-300 mg by mouth 2 (two) times daily. Take 1.5 tab in am, 1 tab in pm   Yes Historical Provider, MD  lithium 600 MG capsule Take 600-1,200 mg by mouth 2 (two)  times daily with a meal. Take 2 tab in am, 1 in pm   Yes Historical Provider, MD  sildenafil (VIAGRA) 50 MG tablet Take 50 mg by mouth daily as needed. Take 1 tab by mouth 1 hour prior to sexual activity.   Yes Historical Provider, MD  traZODone (DESYREL) 100 MG tablet Take 100 mg by mouth 2 (two) times daily.    Yes Historical Provider, MD    Social History: married, lives at home with his wife, drinks a couple of beers a day, smokes 1-1.5pk/day  Family History  Problem Relation Age of Onset  . Hypertension Mother   . Bipolar disorder Father   . Obesity Father   . Obesity Brother     Review of Systems:  Constitutional:  Denies fever, chills, diaphoresis, appetite change and fatigue.  HEENT: Denies photophobia, eye pain, redness, hearing loss, ear pain, congestion, sore throat, rhinorrhea, sneezing, mouth sores, trouble swallowing, neck pain, neck stiffness and tinnitus.   Respiratory: Denies SOB, DOE, cough, chest tightness,  and wheezing.   Cardiovascular: Denies chest pain, palpitations and leg swelling.  Gastrointestinal: Denies nausea, vomiting, abdominal pain, diarrhea, constipation, blood in stool and abdominal distention.  Genitourinary: Denies dysuria, urgency, frequency, hematuria, flank pain and difficulty urinating.  Musculoskeletal: Denies myalgias, back pain, joint swelling, arthralgias and gait problem.  Skin: Denies pallor, rash and wound.  Neurological: Denies dizziness, seizures, syncope, weakness, light-headedness, numbness and headaches.  Hematological: Denies adenopathy. Easy bruising, personal or family bleeding history  Psychiatric/Behavioral: Denies suicidal ideation, mood changes, confusion, nervousness, sleep disturbance and agitation   Physical Exam: Blood pressure 80/42, pulse 62, temperature 97.7 F (36.5 C), temperature source Oral, resp. rate 12, SpO2 99.00%. WUJ:[id number], no acute distress HEENT: PERRLA, EOMI, oral mucosa moist, neck no JCD CVS:S1S2/RRR, no m/r/g Lungs: CTAB Abd: Soft, Nt, BS present no organomegaly Ext: no edema or clubbing Neuro: motor 5/5 bilaterally, sensations intact, reflexes increased in LLEt, tone increased in lower ext, plantars downgoing, Coordination intact, positive tremors and asterixes Cranial nerves intact  Labs on Admission:  Results for orders placed during the hospital encounter of 11/29/11 (from the past 48 hour(s))  CBC     Status: Abnormal   Collection Time   11/29/11 11:25 AM      Component Value Range Comment   WBC 5.8  4.0 - 10.5 (K/uL)    RBC 3.85 (*) 4.22 - 5.81 (MIL/uL)    Hemoglobin 13.8  13.0 - 17.0 (g/dL)    HCT 47.8  29.5 -  62.1 (%)    MCV 106.5 (*) 78.0 - 100.0 (fL)    MCH 35.8 (*) 26.0 - 34.0 (pg)    MCHC 33.7  30.0 - 36.0 (g/dL)    RDW 30.8  65.7 - 84.6 (%)    Platelets 93 (*) 150 - 400 (K/uL)   DIFFERENTIAL     Status: Normal   Collection Time   11/29/11 11:25 AM      Component Value Range Comment   Neutrophils Relative 56  43 - 77 (%)    Neutro Abs 3.2  1.7 - 7.7 (K/uL)    Lymphocytes Relative 32  12 - 46 (%)    Lymphs Abs 1.9  0.7 - 4.0 (K/uL)    Monocytes Relative 11  3 - 12 (%)    Monocytes Absolute 0.6  0.1 - 1.0 (K/uL)    Eosinophils Relative 1  0 - 5 (%)    Eosinophils Absolute 0.1  0.0 - 0.7 (K/uL)  Basophils Relative 1  0 - 1 (%)    Basophils Absolute 0.1  0.0 - 0.1 (K/uL)   COMPREHENSIVE METABOLIC PANEL     Status: Abnormal   Collection Time   11/29/11 11:25 AM      Component Value Range Comment   Sodium 132 (*) 135 - 145 (mEq/L)    Potassium 4.5  3.5 - 5.1 (mEq/L)    Chloride 99  96 - 112 (mEq/L)    CO2 28  19 - 32 (mEq/L)    Glucose, Bld 95  70 - 99 (mg/dL)    BUN 12  6 - 23 (mg/dL)    Creatinine, Ser 1.61  0.50 - 1.35 (mg/dL)    Calcium 09.6 (*) 8.4 - 10.5 (mg/dL)    Total Protein 6.9  6.0 - 8.3 (g/dL)    Albumin 3.6  3.5 - 5.2 (g/dL)    AST 61 (*) 0 - 37 (U/L)    ALT 74 (*) 0 - 53 (U/L)    Alkaline Phosphatase 93  39 - 117 (U/L)    Total Bilirubin 0.5  0.3 - 1.2 (mg/dL)    GFR calc non Af Amer >90  >90 (mL/min)    GFR calc Af Amer >90  >90 (mL/min)   CK TOTAL AND CKMB     Status: Normal   Collection Time   11/29/11 11:25 AM      Component Value Range Comment   Total CK 36  7 - 232 (U/L)    CK, MB 1.6  0.3 - 4.0 (ng/mL)    Relative Index RELATIVE INDEX IS INVALID  0.0 - 2.5    CARBAMAZEPINE LEVEL, TOTAL     Status: Abnormal   Collection Time   11/29/11 11:25 AM      Component Value Range Comment   Carbamazepine Lvl <0.5 (*) 4.0 - 12.0 (ug/mL)   POCT I-STAT TROPONIN I     Status: Normal   Collection Time   11/29/11 11:36 AM      Component Value Range Comment    Troponin i, poc 0.00  0.00 - 0.08 (ng/mL)    Comment 3            URINALYSIS, ROUTINE W REFLEX MICROSCOPIC     Status: Abnormal   Collection Time   11/29/11  1:19 PM      Component Value Range Comment   Color, Urine YELLOW  YELLOW     APPearance CLOUDY (*) CLEAR     Specific Gravity, Urine 1.012  1.005 - 1.030     pH 7.5  5.0 - 8.0     Glucose, UA NEGATIVE  NEGATIVE (mg/dL)    Hgb urine dipstick NEGATIVE  NEGATIVE     Bilirubin Urine NEGATIVE  NEGATIVE     Ketones, ur NEGATIVE  NEGATIVE (mg/dL)    Protein, ur NEGATIVE  NEGATIVE (mg/dL)    Urobilinogen, UA 0.2  0.0 - 1.0 (mg/dL)    Nitrite NEGATIVE  NEGATIVE     Leukocytes, UA NEGATIVE  NEGATIVE  MICROSCOPIC NOT DONE ON URINES WITH NEGATIVE PROTEIN, BLOOD, LEUKOCYTES, NITRITE, OR GLUCOSE <1000 mg/dL.  URINE RAPID DRUG SCREEN (HOSP PERFORMED)     Status: Normal   Collection Time   11/29/11  1:19 PM      Component Value Range Comment   Opiates NONE DETECTED  NONE DETECTED     Cocaine NONE DETECTED  NONE DETECTED     Benzodiazepines NONE DETECTED  NONE DETECTED     Amphetamines NONE  DETECTED  NONE DETECTED     Tetrahydrocannabinol NONE DETECTED  NONE DETECTED     Barbiturates NONE DETECTED  NONE DETECTED    AMMONIA     Status: Normal   Collection Time   11/29/11  1:20 PM      Component Value Range Comment   Ammonia 40  11 - 60 (umol/L)   LITHIUM LEVEL     Status: Normal   Collection Time   11/29/11  1:20 PM      Component Value Range Comment   Lithium Lvl 1.28  0.80 - 1.40 (mEq/L)     Radiological Exams on Admission: Ct Head Wo Contrast  11/29/2011  *RADIOLOGY REPORT*  Clinical Data: 51 year old male with seizure-like activity.  Falls.  CT HEAD WITHOUT CONTRAST  Technique:  Contiguous axial images were obtained from the base of the skull through the vertex without contrast.  Comparison: 05/01/2010 and earlier.  Findings: Visualized paranasal sinuses and mastoids are clear.  No acute osseous abnormality identified.  Visualized orbits  and scalp soft tissues are within normal limits.  Calcified atherosclerosis at the skull base.  No ventriculomegaly. No midline shift, mass effect, or evidence of mass lesion.  [name] white matter differentiation is within normal limits throughout the brain.  No evidence of cortically based acute infarction identified.  No acute intracranial hemorrhage identified.  No suspicious intracranial vascular hyperdensity.  IMPRESSION: Stable and normal noncontrast CT appearance of the brain.  Original Report Authenticated By: Harley Hallmark, M.D.    Assessment/Plan 1. Abnormal movement disorder/hallucinations Suspect medication induced Wellbutrin most likely contributor Lithium toxicity or wellbutrin increasing efficacy of lithium Lithium level pending at this point,  Will also cut down Wellbutrin in half today and quickly taper off since just started recently Neuro consulted  I have requested a psych consult for medication management 2. Bipolar d/o: continue lamictal and lithium if level withim range 3. HTN: continue home meds 4. H/o Hep C Further management as condition evolves  Spent on Admission:  Lakresha Stifter [facility] Pager: [phone] 11/29/2011, 5:16 PM

## 2011-11-29 NOTE — ED Notes (Signed)
Pt. Is unable to use the restroom at this time. 

## 2011-11-30 DIAGNOSIS — F3130 Bipolar disorder, current episode depressed, mild or moderate severity, unspecified: Secondary | ICD-10-CM

## 2011-11-30 DIAGNOSIS — G259 Extrapyramidal and movement disorder, unspecified: Secondary | ICD-10-CM | POA: Diagnosis present

## 2011-11-30 LAB — COMPREHENSIVE METABOLIC PANEL
ALT: 56 U/L — ABNORMAL HIGH (ref 0–53)
AST: 41 U/L — ABNORMAL HIGH (ref 0–37)
Alkaline Phosphatase: 80 U/L (ref 39–117)
CO2: 26 mEq/L (ref 19–32)
Chloride: 106 mEq/L (ref 96–112)
Creatinine, Ser: 0.91 mg/dL (ref 0.50–1.35)
GFR calc non Af Amer: >90 mL/min (ref >90)
Sodium: 137 mEq/L (ref 135–145)
Total Bilirubin: 0.3 mg/dL (ref 0.3–1.2)

## 2011-11-30 LAB — LITHIUM LEVEL: Lithium Lvl: 1.1 mEq/L (ref 0.80–1.40)

## 2011-11-30 LAB — CBC
MCH: 34.9 pg — ABNORMAL HIGH (ref 26.0–34.0)
MCHC: 32.5 g/dL (ref 30.0–36.0)
Platelets: 79 10*3/uL — ABNORMAL LOW (ref 150–400)
RDW: 15.1 % (ref 11.5–15.5)

## 2011-11-30 MED ORDER — OXYCODONE HCL 5 MG PO TABS
5.0000 mg | ORAL_TABLET | Freq: Once | ORAL | Status: AC
  Administered 2011-11-30: 5 mg via ORAL
  Filled 2011-11-30: qty 1

## 2011-11-30 MED ORDER — SODIUM POLYSTYRENE SULFONATE 15 GM/60ML PO SUSP
15.0000 g | Freq: Once | ORAL | Status: AC
  Administered 2011-11-30: 15 g via ORAL
  Filled 2011-11-30: qty 60

## 2011-11-30 MED ORDER — NICOTINE 14 MG/24HR TD PT24
14.0000 mg | MEDICATED_PATCH | Freq: Every day | TRANSDERMAL | Status: DC
  Administered 2011-11-30 – 2011-12-01 (×2): 14 mg via TRANSDERMAL
  Filled 2011-11-30 (×2): qty 1

## 2011-11-30 MED ORDER — AMLODIPINE BESYLATE 5 MG PO TABS
5.0000 mg | ORAL_TABLET | Freq: Every day | ORAL | Status: DC
  Administered 2011-11-30 – 2011-12-01 (×2): 5 mg via ORAL
  Filled 2011-11-30 (×2): qty 1

## 2011-11-30 NOTE — Consult Note (Signed)
Reason for Consult: Mdication management of bipolar disorder Referring Physician: Dr. Lemont Fillers is an 51 y.o. male.  HPI: Patient was seen, and his brother was at bed side and chart reviewed. Patient has chronic history of bipolar disorder and receiving treatment from [facility] and his current psychiatrist Dr. Ladona Ridgel. He was receiving lithium and lamictal over several years. He was startantidepressant medication because he was severe depression, unable to function, even not eating and lost significant of weight. He was initially given prozac which was not helpful and caused sedation. He was given a trail of wellbutrin 200 mg about a month ago and now he presents with leg weakness and falls and possible. seizure activity. Reportedly he was not depressed and able to participate in his regular activities. Patient has no symptoms of depressio, mania and psychosis at this time. His wellbutrin has been reduced wat ith intension of discontinue at this time.  Past Medical History  Diagnosis Date  . Tachycardia   . Peripheral neuropathy   . Alcohol abuse     hx of quit 4/04  . Bilateral external ear infections 2007  . Tobacco abuse   . Bipolar affective disorder     Followed by Dr. [name]  . Avascular necrosis     SP right and left hip replacements and L shoulder arthroplasty, secondary to steroid use.  Has been on embrel in past.  . Psoriasis     Previously on embrel, stopped because of price.   . Hypertension   . Hepatitis C   . ARF (acute renal failure) 2008    Multifactorial in setting of over use of ibuprofen and HCTZ./ACEI    Past Surgical History  Procedure Date  . Right total hip replacement 2004    Due to AVN  . Left total hip replacement 2001    Due to AVN  . Closed manipulation, pinning, application of a traction pin right 2002  . Right shoulder arthroplasty     Due to AVN  . Appendectomy 1989    Family History  Problem Relation Age of Onset  .  Hypertension Mother   . Bipolar disorder Father   . Obesity Father   . Obesity Brother     Social History:  reports that he has been smoking Cigarettes.  He has never used smokeless tobacco. He reports that he does not drink alcohol or use illicit drugs.  Allergies:  Allergies  Allergen Reactions  . Erythromycin     REACTION: GI upset  . Fentanyl Citrate     REACTION: itching  . Hydromorphone Hcl     REACTION: itching  . Prochlorperazine Maleate     REACTION: jaw lock    Medications: I have reviewed the patient's current medications.  Results for orders placed during the hospital encounter of 11/29/11 (from the past 48 hour(s))  CBC     Status: Abnormal   Collection Time   11/29/11 11:25 AM      Component Value Range Comment   WBC 5.8  4.0 - 10.5 (K/uL)    RBC 3.85 (*) 4.22 - 5.81 (MIL/uL)    Hemoglobin 13.8  13.0 - 17.0 (g/dL)    HCT 45.4  09.8 - 11.9 (%)    MCV 106.5 (*) 78.0 - 100.0 (fL)    MCH 35.8 (*) 26.0 - 34.0 (pg)    MCHC 33.7  30.0 - 36.0 (g/dL)    RDW 14.7  82.9 - 56.2 (%)    Platelets 93 (*)  150 - 400 (K/uL)   DIFFERENTIAL     Status: Normal   Collection Time   11/29/11 11:25 AM      Component Value Range Comment   Neutrophils Relative 56  43 - 77 (%)    Neutro Abs 3.2  1.7 - 7.7 (K/uL)    Lymphocytes Relative 32  12 - 46 (%)    Lymphs Abs 1.9  0.7 - 4.0 (K/uL)    Monocytes Relative 11  3 - 12 (%)    Monocytes Absolute 0.6  0.1 - 1.0 (K/uL)    Eosinophils Relative 1  0 - 5 (%)    Eosinophils Absolute 0.1  0.0 - 0.7 (K/uL)    Basophils Relative 1  0 - 1 (%)    Basophils Absolute 0.1  0.0 - 0.1 (K/uL)   COMPREHENSIVE METABOLIC PANEL     Status: Abnormal   Collection Time   11/29/11 11:25 AM      Component Value Range Comment   Sodium 132 (*) 135 - 145 (mEq/L)    Potassium 4.5  3.5 - 5.1 (mEq/L)    Chloride 99  96 - 112 (mEq/L)    CO2 28  19 - 32 (mEq/L)    Glucose, Bld 95  70 - 99 (mg/dL)    BUN 12  6 - 23 (mg/dL)    Creatinine, Ser 1.61  0.50 -  1.35 (mg/dL)    Calcium 09.6 (*) 8.4 - 10.5 (mg/dL)    Total Protein 6.9  6.0 - 8.3 (g/dL)    Albumin 3.6  3.5 - 5.2 (g/dL)    AST 61 (*) 0 - 37 (U/L)    ALT 74 (*) 0 - 53 (U/L)    Alkaline Phosphatase 93  39 - 117 (U/L)    Total Bilirubin 0.5  0.3 - 1.2 (mg/dL)    GFR calc non Af Amer >90  >90 (mL/min)    GFR calc Af Amer >90  >90 (mL/min)   CK TOTAL AND CKMB     Status: Normal   Collection Time   11/29/11 11:25 AM      Component Value Range Comment   Total CK 36  7 - 232 (U/L)    CK, MB 1.6  0.3 - 4.0 (ng/mL)    Relative Index RELATIVE INDEX IS INVALID  0.0 - 2.5    CARBAMAZEPINE LEVEL, TOTAL     Status: Abnormal   Collection Time   11/29/11 11:25 AM      Component Value Range Comment   Carbamazepine Lvl <0.5 (*) 4.0 - 12.0 (ug/mL)   POCT I-STAT TROPONIN I     Status: Normal   Collection Time   11/29/11 11:36 AM      Component Value Range Comment   Troponin i, poc 0.00  0.00 - 0.08 (ng/mL)    Comment 3            URINALYSIS, ROUTINE W REFLEX MICROSCOPIC     Status: Abnormal   Collection Time   11/29/11  1:19 PM      Component Value Range Comment   Color, Urine YELLOW  YELLOW     APPearance CLOUDY (*) CLEAR     Specific Gravity, Urine 1.012  1.005 - 1.030     pH 7.5  5.0 - 8.0     Glucose, UA NEGATIVE  NEGATIVE (mg/dL)    Hgb urine dipstick NEGATIVE  NEGATIVE     Bilirubin Urine NEGATIVE  NEGATIVE     Ketones, ur NEGATIVE  NEGATIVE (mg/dL)    Protein, ur NEGATIVE  NEGATIVE (mg/dL)    Urobilinogen, UA 0.2  0.0 - 1.0 (mg/dL)    Nitrite NEGATIVE  NEGATIVE     Leukocytes, UA NEGATIVE  NEGATIVE  MICROSCOPIC NOT DONE ON URINES WITH NEGATIVE PROTEIN, BLOOD, LEUKOCYTES, NITRITE, OR GLUCOSE <1000 mg/dL.  URINE RAPID DRUG SCREEN (HOSP PERFORMED)     Status: Normal   Collection Time   11/29/11  1:19 PM      Component Value Range Comment   Opiates NONE DETECTED  NONE DETECTED     Cocaine NONE DETECTED  NONE DETECTED     Benzodiazepines NONE DETECTED  NONE DETECTED     Amphetamines  NONE DETECTED  NONE DETECTED     Tetrahydrocannabinol NONE DETECTED  NONE DETECTED     Barbiturates NONE DETECTED  NONE DETECTED    AMMONIA     Status: Normal   Collection Time   11/29/11  1:20 PM      Component Value Range Comment   Ammonia 40  11 - 60 (umol/L)   LITHIUM LEVEL     Status: Normal   Collection Time   11/29/11  1:20 PM      Component Value Range Comment   Lithium Lvl 1.28  0.80 - 1.40 (mEq/L)     Ct Head Wo Contrast  11/29/2011  *RADIOLOGY REPORT*  Clinical Data: 51 year old male with seizure-like activity.  Falls.  CT HEAD WITHOUT CONTRAST  Technique:  Contiguous axial images were obtained from the base of the skull through the vertex without contrast.  Comparison: 05/01/2010 and earlier.  Findings: Visualized paranasal sinuses and mastoids are clear.  No acute osseous abnormality identified.  Visualized orbits and scalp soft tissues are within normal limits.  Calcified atherosclerosis at the skull base.  No ventriculomegaly. No midline shift, mass effect, or evidence of mass lesion.  [name] white matter differentiation is within normal limits throughout the brain.  No evidence of cortically based acute infarction identified.  No acute intracranial hemorrhage identified.  No suspicious intracranial vascular hyperdensity.  IMPRESSION: Stable and normal noncontrast CT appearance of the brain.  Original Report Authenticated By: Harley Hallmark, M.D.    No depression, No anxiety, No psychosis and Positive for bipolar and depression Blood pressure 144/79, pulse 64, temperature 98.1 F (36.7 C), temperature source Oral, resp. rate 20, height 5\' 11"  (1.803 m), weight 155 lb 6.8 oz (70.5 kg), SpO2 100.00%.   Assessment/Plan: Bipolar disorder, MRE is depression . May give a trail of Remeron 15 mg for depression and increase appetite  Provided another option of low dose of Cymbalta if needed. His lithium level seems to be therapeutic.  Recommend or agree with decreased dose of  wellbutrin which may have contributed presenting symptoms along with other medications.                                                           Saul Dorsi,JANARDHAHA R. 11/30/2011, 12:40 AM

## 2011-11-30 NOTE — ED Provider Notes (Signed)
Medical screening examination/treatment/procedure(s) were conducted as a shared visit with non-physician practitioner(s) and myself.  I personally evaluated the patient during the encounter  AMS and seizure like activity after wellbutrin.  First time seizure  RRR. Normal s1s2 Lungs CTAB No edema abd soft, NT, ND  Labs, imaging, admit  Dayton Bailiff, MD 11/30/11 1459

## 2011-11-30 NOTE — Progress Notes (Addendum)
PCP: Seeking a new PCP  Brief HPI:  [name] is a 51/M with h/o bipolar disorder maintained on Lithium and Lamictal was started on Prozac 6weeks ago for depression this caused him to be sleepy and lethargic, subsequently this was discontinued on 4/16 and was started on Wellbutrin instead. Since starting wellbutrin wife reports intermittent confusion, hallucinations, double vision, abnormal involuntary movements with his legs buckling, unsteadiness. On the morning of admission day he woke up, his walk was noted to be unsteady, then he fell down, experienced some spasms and involuntary movements involving his lower extremities greater than uppers, then curled up in a fetal position. Pt and wife deny loss of consiousness. He did hit his head on the wall.  Past medical history:  Past Medical History   Diagnosis  Date   .  Tachycardia    .  Peripheral neuropathy    .  Alcohol abuse      hx of quit 4/04   .  Bilateral external ear infections  2007   .  Tobacco abuse    .  Bipolar affective disorder      Followed by Dr. Hortencia Melendez   .  Avascular necrosis      SP right and left hip replacements and L shoulder arthroplasty, secondary to steroid use. Has been on embrel in past.   .  Psoriasis      Previously on embrel, stopped because of price.   .  Hypertension    .  Hepatitis C    .  ARF (acute renal failure)  2008     Multifactorial in setting of over use of ibuprofen and HCTZ./ACEI     Consultants: Psyche and Neuro  Procedures: None  Subjective: Patient still with headache. Denies any focal weakness. Says his legs are still weak but he feels overall better. Hasn't ambulated yet. Tolerating diet.  Objective: Vital signs in last 24 hours: Temp:  [97.7 F (36.5 C)-98.1 F (36.7 C)] 97.8 F (36.6 C) (04/27 1610) Pulse Rate:  [52-67] 58  (04/27 0633) Resp:  [12-20] 16  (04/27 0633) BP: (68-175)/(42-100) 175/94 mmHg (04/27 0633) SpO2:  [98 %-100 %] 99 % (04/27 9604) Weight:  [70.5  kg (155 lb 6.8 oz)] 70.5 kg (155 lb 6.8 oz) (04/26 1722) Weight change:  Last BM Date: 11/27/11  Intake/Output from previous day: 04/26 0701 - 04/27 0700 In: 1775 [I.V.:1775] Out: 1700 [Urine:1700] Intake/Output this shift:    General appearance: alert, cooperative, appears stated age and no distress Head: Normocephalic, without obvious abnormality, atraumatic Eyes: conjunctivae/corneas clear. PERRL, EOM's intact. Fundi benign. Neck: no adenopathy, no carotid bruit, no JVD, supple, symmetrical, trachea midline and thyroid not enlarged, symmetric, no tenderness/mass/nodules Resp: clear to auscultation bilaterally Cardio: regular rate and rhythm, S1, S2 normal, no murmur, click, rub or gallop GI: soft, non-tender; bowel sounds normal; no masses,  no organomegaly Extremities: extremities normal, atraumatic, no cyanosis or edema Skin: Rash consistent with Psoriasis over both knees Neurologic: Alert and oriented. No focal deficits. Both LE were weak.  Lab Results:  Basename 11/30/11 0521 11/29/11 1125  WBC 3.7* 5.8  HGB 12.1* 13.8  HCT 37.2* 41.0  PLT 79* 93*   BMET  Basename 11/30/11 0521 11/29/11 1125  NA 137 132*  K 5.3* 4.5  CL 106 99  CO2 26 28  GLUCOSE 83 95  BUN 10 12  CREATININE 0.91 0.83  CALCIUM 9.7 10.7*  ALT 56* 74*    Studies/Results: Ct Head Wo Contrast  11/29/2011  *  RADIOLOGY REPORT*  Clinical Data: 51 year old male male with seizure-like activity.  Falls.  CT HEAD WITHOUT CONTRAST  Technique:  Contiguous axial images were obtained from the base of the skull through the vertex without contrast.  Comparison: 05/01/2010 and earlier.  Findings: Visualized paranasal sinuses and mastoids are clear.  No acute osseous abnormality identified.  Visualized orbits and scalp soft tissues are within normal limits.  Calcified atherosclerosis at the skull base.  No ventriculomegaly. No midline shift, mass effect, or evidence of mass lesion.  [name] white matter differentiation is  within normal limits throughout the brain.  No evidence of cortically based acute infarction identified.  No acute intracranial hemorrhage identified.  No suspicious intracranial vascular hyperdensity.  IMPRESSION: Stable and normal noncontrast CT appearance of the brain.  Original Report Authenticated By: Harley Hallmark, M.D.    Medications:  Scheduled:    . amLODipine  5 mg Oral Daily  . atenolol  25 mg Oral Daily  . buPROPion  100 mg Oral Daily  . clobetasol  1 application Topical BID  . cloNIDine  0.1 mg Oral BID  . gabapentin  300 mg Oral TID  . lamoTRIgine  200 mg Oral QHS  . lamoTRIgine  300 mg Oral Daily  . lithium carbonate  1,200 mg Oral Q breakfast  . lithium carbonate  600 mg Oral QPM  . oxyCODONE  5 mg Oral Once  . sodium chloride  500 mL Intravenous Once  . traZODone  100 mg Oral QHS  . DISCONTD: atenolol  100 mg Oral Daily  . DISCONTD: cloNIDine  0.3 mg Oral BID  . DISCONTD: lamoTRIgine  200-300 mg Oral BID    Assessment/Plan:  Principal Problem:  *Movement disorder Active Problems:  BIPOLAR AFFECTIVE DISORDER  TOBACCO ABUSE  HYPERTENSION  PSORIASIS    Abnormal movement disorder/hallucinations  Suspect medication induced. Wellbutrin most likely contributor. Lithium levels are normal. Taper Wellbutrin to off. Appreciate psyche and neuro input. PT/OT to see.  Bipolar d/o Continue lamictal and lithium.   HTN Poorly controlled. Add Amlodipine. Dose of BB decreased due to bradycardia.  Headache Probably from fall. No abnormalilty on CT. Oxy IR.  H/o Hep C  Stable  Tobacco Abuse Nicotine patch  Peripheral Neuropathy Gabapentin  Hyperkalemia Kayexalate  DVT Prophylaxis SCD's  Full Code  Disposition: PT/OT to see. Possible discharge tomorrow if he is able to ambulate.     LOS: 1 day   [facility]  [facility] Pager [phone] 11/30/2011, 10:10 AM

## 2011-12-01 LAB — CBC
MCH: 35.3 pg — ABNORMAL HIGH (ref 26.0–34.0)
Platelets: 65 10*3/uL — ABNORMAL LOW (ref 150–400)
RBC: 3.2 MIL/uL — ABNORMAL LOW (ref 4.22–5.81)
RDW: 15 % (ref 11.5–15.5)
WBC: 2.9 10*3/uL — ABNORMAL LOW (ref 4.0–10.5)

## 2011-12-01 LAB — BASIC METABOLIC PANEL
Calcium: 9.4 mg/dL (ref 8.4–10.5)
GFR calc non Af Amer: >90 mL/min (ref >90)
Sodium: 138 mEq/L (ref 135–145)

## 2011-12-01 MED ORDER — ATENOLOL 25 MG PO TABS: 100.0000 mg | ORAL_TABLET | Freq: Every day | ORAL | Status: DC

## 2011-12-01 MED ORDER — NICOTINE 14 MG/24HR TD PT24: 1.0000 | MEDICATED_PATCH | Freq: Every day | TRANSDERMAL | Status: DC

## 2011-12-01 MED ORDER — BUPROPION HCL ER (SR) 100 MG PO TB12: 100.0000 mg | ORAL_TABLET | Freq: Every day | ORAL | Status: DC

## 2011-12-01 MED ORDER — AMLODIPINE BESYLATE 5 MG PO TABS: 5.0000 mg | ORAL_TABLET | Freq: Every day | ORAL | Status: DC

## 2011-12-01 NOTE — Progress Notes (Signed)
Patient discharged and left the floor with wife.

## 2011-12-01 NOTE — Progress Notes (Signed)
CM spoke with pt concerning dc planning. Md order for HHPT. Per pt request [facility] for medication management. Per pt choice [facility] to provide [facility] services. Pt's spouse to assist in home care. RN to go over [employer] instructions with spouse present.   Leonie Green [phone]

## 2011-12-01 NOTE — Evaluation (Signed)
Occupational Therapy Evaluation Patient Details Name: [name] MRN: [id number] DOB: 10-13-1960 Today's Date: 12/01/2011 Time: 2956-2130 OT Time Calculation (min): 27 min  OT Assessment / Plan / Recommendation Clinical Impression  Pt admitted after fall believed to be due to medications.  Pt with baseline tremor, but independent in ADL/IADL.  Some mild unsteadiness with ambulation.  Pt is agreeable to using his tub bench and RW at home until he returns to his baseline.  Will have wife available.  No further OT or DME needs.    OT Assessment  Patient does not need any further OT services    Follow Up Recommendations  No OT follow up;Supervision/Assistance - 24 hour;Other (comment) (initially)    Equipment Recommendations  None recommended by OT    Frequency      Precautions / Restrictions Precautions Precautions: Fall Precaution Comments: Instructed to slow pace, particularly when transitioning from supine to sit, sit to stand.   Pertinent Vitals/Pain     ADL  Eating/Feeding: Performed;Independent Where Assessed - Eating/Feeding: Edge of bed Grooming: Performed;Wash/dry hands;Supervision/safety Where Assessed - Grooming: Standing at sink Upper Body Bathing: Simulated;Set up Where Assessed - Upper Body Bathing: Sitting, bed Lower Body Bathing: Simulated;Set up Where Assessed - Lower Body Bathing: Sitting, bed;Sit to stand from bed Upper Body Dressing: Performed;Set up Where Assessed - Upper Body Dressing: Sitting, bed Lower Body Dressing: Performed;Set up Where Assessed - Lower Body Dressing: Sitting, bed;Sit to stand from bed Toilet Transfer: Performed;Minimal assistance;Other (comment) (min guard) Toilet Transfer Method: Proofreader: Regular height toilet Toileting - Clothing Manipulation: Simulated;Supervision/safety Where Assessed - Toileting Clothing Manipulation: Standing Toileting - Hygiene: Simulated;Independent Where Assessed -  Toileting Hygiene: Sit on 3-in-1 or toilet Equipment Used: Gait belt Ambulation Related to ADLs: Min guard assist and use of IV pole. ADL Comments: Pt reports that tremor does not interfere with function.    OT Goals    Visit Information  Last OT Received On: 12/01/11 Assistance Needed: +1    Subjective Data  Subjective: "It's the medicine that makes me tremble." Patient Stated Goal: Home with wife.   Prior Functioning  Home Living Lives With: Spouse Available Help at Discharge: Family Type of Home: House Home Access: Stairs to enter Secretary/administrator of Steps: 1 Entrance Stairs-Rails: None Home Layout: One level Bathroom Shower/Tub: Engineer, manufacturing systems: Standard Home Adaptive Equipment: Tub transfer bench;Grab bars in shower;Walker - rolling;Hand-held shower hose Prior Function Level of Independence: Independent Able to Take Stairs?: Yes Vocation: On disability Communication Communication: No difficulties Dominant Hand: Right    Cognition  Overall Cognitive Status: Appears within functional limits for tasks assessed/performed Arousal/Alertness: Awake/alert Orientation Level: Appears intact for tasks assessed Behavior During Session: [facility] for tasks performed    Extremity/Trunk Assessment Right Upper Extremity Assessment RUE ROM/Strength/Tone: Deficits RUE ROM/Strength/Tone Deficits: tremor RUE Coordination: Deficits RUE Coordination Deficits: difficulty tying gown due to tremor in both UE's Left Upper Extremity Assessment LUE ROM/Strength/Tone: Deficits LUE ROM/Strength/Tone Deficits: tremor LUE Coordination: Deficits LUE Coordination Deficits: difficulty tying gown due to tremor in both UE's Right Lower Extremity Assessment RLE ROM/Strength/Tone: [facility] for tasks assessed Left Lower Extremity Assessment LLE ROM/Strength/Tone: [facility] for tasks assessed Trunk Assessment Trunk Assessment: Other exceptions Trunk Exceptions: scoliotic deformity     Mobility Bed Mobility Bed Mobility: Supine to Sit;Sit to Supine Supine to Sit: 6: Modified independent (Device/Increase time) Sit to Supine: 6: Modified independent (Device/Increase time) Transfers Sit to Stand: 6: Modified independent (Device/Increase time) Stand to Sit: 6: Modified independent (  Device/Increase time)   Exercise    Balance   End of Session OT - End of Session Activity Tolerance: Patient tolerated treatment well Patient left: in bed;with nursing in room;with call bell/phone within reach   Evern Bio 12/01/2011, 1:54 PM [phone]

## 2011-12-01 NOTE — Evaluation (Signed)
Physical Therapy Evaluation Patient Details Name: [name] MRN: [id number] DOB: 1960-09-24 Today's Date: 12/01/2011 Time: 1000-1037 PT Time Calculation (min): 37 min  PT Assessment / Plan / Recommendation Clinical Impression  Patient is a 51 y/o male admitted s/p fall and medication reaction who presents with decreased balance with h/o both hips and shoulders replaced and 2 back surgeries.  He will benefit from skilled PT in the acute setting to maximzie independence and safety and decrease risk of falls.  May need to use walker at home for fall prevention initially with 24 hour supervision and HHPT.    PT Assessment  Patient needs continued PT services    Follow Up Recommendations  Home health PT    Equipment Recommendations  None recommended by PT    Frequency   3x/week    Precautions / Restrictions Precautions Precautions: Fall Precaution Comments: only recent fall just prior to admission due to medication with hit to head; advised on several fall risk reduction techniques including sitting several mintues prior to standing and standing still prior to walking; encouraged walker use at home and initial close supervision by wife; also clear pathway to bathroom.   Pertinent Vitals/Pain Denies pain; sitting BP 150/97, standing 130/87, after 2 minutes 145/92      Mobility  Bed Mobility Bed Mobility: Supine to Sit;Sit to Supine Supine to Sit: 6: Modified independent (Device/Increase time) Sit to Supine: 6: Modified independent (Device/Increase time) Transfers Transfers: Sit to Stand;Stand to Sit Sit to Stand: 6: Modified independent (Device/Increase time) Stand to Sit: 6: Modified independent (Device/Increase time) Ambulation/Gait Ambulation/Gait Assistance: 4: Min guard;5: Supervision Ambulation Distance (Feet): 400 Feet Assistive device: None Ambulation/Gait Assistance Details: wide based gait with inward turn of both feet, cues for decreased speed for safety, LOB on  turns and veers to right Gait Pattern: Step-through pattern;Wide base of support Stairs: Yes Stairs Assistance: 4: Min guard Stair Management Technique: Step to pattern;One rail Left Number of Stairs: 2     Exercises     PT Goals Acute Rehab PT Goals PT Goal Formulation: With patient Time For Goal Achievement: 12/08/11 Potential to Achieve Goals: Good Pt will Stand: with modified independence;3 - 5 min;with no upper extremity support (during functional activity) PT Goal: Stand - Progress: Goal set today Pt will Ambulate: >150 feet;with modified independence;with least restrictive assistive device PT Goal: Ambulate - Progress: Goal set today Pt will Go Up / Down Stairs: with modified independence;with least restrictive assistive device PT Goal: Up/Down Stairs - Progress: Goal set today  Visit Information  Last PT Received On: 12/01/11 Assistance Needed: +1    Subjective Data  Subjective: Glad to see you; hope to go home today Patient Stated Goal: To return home with wife assist   Prior Functioning  Home Living Lives With: Spouse Type of Home: House Home Access: Stairs to enter Secretary/administrator of Steps: 1 Entrance Stairs-Rails: None Home Layout: One level Bathroom Shower/Tub: Engineer, manufacturing systems: Standard Home Adaptive Equipment: Tub transfer bench;Walker - rolling Prior Function Level of Independence: Independent Able to Take Stairs?: Yes Communication Communication: No difficulties    Cognition  Overall Cognitive Status: Appears within functional limits for tasks assessed/performed Arousal/Alertness: Awake/alert Orientation Level: Appears intact for tasks assessed Behavior During Session: [facility] for tasks performed    Extremity/Trunk Assessment Right Upper Extremity Assessment RUE Coordination: Deficits RUE Coordination Deficits: difficulty tying gown due to tremor in both UE's Left Upper Extremity Assessment LUE Coordination: Deficits LUE  Coordination Deficits: difficulty tying gown  due to tremor in both UE's Right Lower Extremity Assessment RLE ROM/Strength/Tone: [facility] for tasks assessed Left Lower Extremity Assessment LLE ROM/Strength/Tone: [facility] for tasks assessed Trunk Assessment Trunk Assessment: Other exceptions Trunk Exceptions: scoliotic deformity   Balance Balance Balance Assessed: Yes Static Standing Balance Static Standing - Balance Support: No upper extremity supported Static Standing - Level of Assistance: 6: Modified independent (Device/Increase time) Static Standing - Comment/# of Minutes: static standing for 2 mintues for orthostatic vitals  End of Session PT - End of Session Equipment Utilized During Treatment: Gait belt Activity Tolerance: Patient tolerated treatment well Patient left: in bed   [facility] 12/01/2011, 1:02 PM

## 2011-12-01 NOTE — Discharge Summary (Signed)
Physician Discharge Summary  Patient ID: [name] MRN: [id number] DOB/AGE: 1960/12/28 51 y.o.  Admit date: 11/29/2011 Discharge date: 12/01/2011  PCP: No primary provider on file.  Discharge Diagnoses:  Principal Problem:  *Movement disorder Active Problems:  BIPOLAR AFFECTIVE DISORDER  TOBACCO ABUSE  HYPERTENSION  PSORIASIS   Discharged Condition: fair  Initial History: [name] is a 50/M with h/o bipolar disorder maintained on Lithium and Lamictal was started on Prozac 6weeks ago for depression this caused him to be sleepy and lethargic, subsequently this was discontinued on 4/16 and was started on Wellbutrin instead. Since starting wellbutrin wife reports intermittent confusion, hallucinations, double vision, abnormal involuntary movements with his legs buckling, unsteadiness. On the morning of admission day he woke up, his walk was noted to be unsteady, then he fell down, experienced some spasms and involuntary movements involving his lower extremities greater than uppers, then curled up in a fetal position. Pt and wife deny loss of consiousness. He did hit his head on the wall.  Hospital Course:   Abnormal movement disorder/hallucinations  Suspect medication induced. Wellbutrin most likely contributor. Lithium levels are normal. Taper Wellbutrin to off. Have already reduced dose. Can stop after taking reduced dose for 1 week. Patient was seen by Neurologist and Psychiatrist. Patient will need to follow up with his Psychiatrist to discuss further plans. He is still somewhat tremulous but improved. Since he has unsteady gait, he will benefit from Home Health PT. Physical therapy saw this patient and recommended the same.  Bipolar d/o  Patient remained stable. He was continued on lamictal and lithium.   HTN  Poorly controlled. Add Amlodipine. Dose of BB decreased due to bradycardia. Continue clonidine. Follow up with PCP.  Headache  Probably from fall. No  abnormalilty on CT. Oxy IR was prescribed and patient felt better.   H/o Hep C  Stable. He has thrombocytopenia as a result of the same. No bleeding was noticed.  Tobacco Abuse  Nicotine patch was prescribed. He was counseled to stop smoking.  Peripheral Neuropathy  Gabapentin was continued for the same.  Hyperkalemia  He was found to be hyperkalemic for which Kayexalate was given and potassium levels are now normal. Renal function is normal.    PERTINENT LABS  Platelet count of 93 at admission and 65 at discharge.  IMAGING STUDIES Ct Head Wo Contrast  11/29/2011  *RADIOLOGY REPORT*  Clinical Data: 51 year old male with seizure-like activity.  Falls.  CT HEAD WITHOUT CONTRAST  Technique:  Contiguous axial images were obtained from the base of the skull through the vertex without contrast.  Comparison: 05/01/2010 and earlier.  Findings: Visualized paranasal sinuses and mastoids are clear.  No acute osseous abnormality identified.  Visualized orbits and scalp soft tissues are within normal limits.  Calcified atherosclerosis at the skull base.  No ventriculomegaly. No midline shift, mass effect, or evidence of mass lesion.  [name] white matter differentiation is within normal limits throughout the brain.  No evidence of cortically based acute infarction identified.  No acute intracranial hemorrhage identified.  No suspicious intracranial vascular hyperdensity.  IMPRESSION: Stable and normal noncontrast CT appearance of the brain.  Original Report Authenticated By: Harley Hallmark, M.D.    Discharge Exam: Blood pressure 150/97, pulse 76, temperature 98 F (36.7 C), temperature source Oral, resp. rate 16, height 5\' 11"  (1.803 m), weight 70.5 kg (155 lb 6.8 oz), SpO2 100.00%. General appearance: alert, cooperative, appears stated age and no distress Head: Normocephalic, without obvious abnormality, atraumatic Resp: clear  to auscultation bilaterally Cardio: regular rate and rhythm, S1, S2  normal, no murmur, click, rub or gallop GI: soft, non-tender; bowel sounds normal; no masses,  no organomegaly Pulses: 2+ and symmetric Neurologic: Alert and oriented. No focal deficits. Slightly tremulous.  Disposition: Home  Discharge Orders    Future Orders Please Complete By Expires   Diet - low sodium heart healthy      Increase activity slowly      Discharge instructions      Comments:   Be sure to follow up with your doctor/psychiatrist to discuss further treatment options   [place]         Current Discharge Medication List    START taking these medications   Details  amLODipine (NORVASC) 5 MG tablet Take 1 tablet (5 mg total) by mouth daily. Qty: 30 tablet, Refills: 0    nicotine (NICODERM CQ - DOSED IN MG/24 HOURS) 14 mg/24hr patch Place 1 patch onto the skin daily. Qty: 28 patch, Refills: 0      CONTINUE these medications which have CHANGED   Details  atenolol (TENORMIN) 25 MG tablet Take 4 tablets (100 mg total) by mouth daily. Qty: 30 tablet, Refills: 0    buPROPion (WELLBUTRIN SR) 100 MG 12 hr tablet Take 1 tablet (100 mg total) by mouth daily. For 7 days and then stop Qty: 7 tablet, Refills: 0      CONTINUE these medications which have NOT CHANGED   Details  clobetasol (TEMOVATE) 0.05 % external solution Apply 1 application topically 2 (two) times daily. Applied to sensitive areas for psoriasis.    cloNIDine (CATAPRES) 0.3 MG tablet Take 1 tablet (0.3 mg total) by mouth 2 (two) times daily. Qty: 60 tablet, Refills: 5    etanercept (ENBREL) 50 MG/ML injection Inject 25 mg into the skin 2 (two) times a week. Given on Tuesdays and Saturdays.    gabapentin (NEURONTIN) 300 MG capsule Take 300 mg by mouth 3 (three) times daily.    ibuprofen (ADVIL,MOTRIN) 200 MG tablet Take 800 mg by mouth every 8 (eight) hours as needed. For pain.    lamoTRIgine (LAMICTAL) 200 MG tablet Take 200-300 mg by mouth 2 (two) times daily. Take 1.5 tab in am, 1 tab in pm    lithium  600 MG capsule Take 600-1,200 mg by mouth 2 (two) times daily with a meal. Take 2 tab in am, 1 in pm    sildenafil (VIAGRA) 50 MG tablet Take 50 mg by mouth daily as needed. Take 1 tab by mouth 1 hour prior to sexual activity.    traZODone (DESYREL) 100 MG tablet Take 100 mg by mouth 2 (two) times daily.        Follow-up Information    Schedule an appointment as soon as possible for a visit with Your Doctor/psychiatrist. (to discuss your psyche medications)          Total Discharge Time: 35 mins  [facility]  [facility] Pager [phone]  12/01/2011, 1:15 PM

## 2011-12-03 ENCOUNTER — Emergency Department (EMERGENCY_DEPARTMENT_HOSPITAL)
Admission: EM | Admit: 2011-12-03 | Discharge: 2011-12-05 | Disposition: A | Discharge status: Other Acute Inpatient Hospital | Payer: Medicare Other | Source: Home / Self Care | Attending: Emergency Medicine | Admitting: Emergency Medicine

## 2011-12-03 ENCOUNTER — Encounter (HOSPITAL_COMMUNITY): Payer: Self-pay | Admitting: Emergency Medicine

## 2011-12-03 ENCOUNTER — Emergency Department (HOSPITAL_COMMUNITY): Payer: Medicare Other

## 2011-12-03 DIAGNOSIS — R5381 Other malaise: Secondary | ICD-10-CM | POA: Insufficient documentation

## 2011-12-03 DIAGNOSIS — Z79899 Other long term (current) drug therapy: Secondary | ICD-10-CM | POA: Insufficient documentation

## 2011-12-03 DIAGNOSIS — R269 Unspecified abnormalities of gait and mobility: Secondary | ICD-10-CM | POA: Insufficient documentation

## 2011-12-03 DIAGNOSIS — Z8619 Personal history of other infectious and parasitic diseases: Secondary | ICD-10-CM | POA: Insufficient documentation

## 2011-12-03 DIAGNOSIS — I10 Essential (primary) hypertension: Secondary | ICD-10-CM | POA: Insufficient documentation

## 2011-12-03 DIAGNOSIS — R5383 Other fatigue: Secondary | ICD-10-CM | POA: Insufficient documentation

## 2011-12-03 DIAGNOSIS — F29 Unspecified psychosis not due to a substance or known physiological condition: Secondary | ICD-10-CM

## 2011-12-03 DIAGNOSIS — R259 Unspecified abnormal involuntary movements: Secondary | ICD-10-CM | POA: Insufficient documentation

## 2011-12-03 DIAGNOSIS — F319 Bipolar disorder, unspecified: Secondary | ICD-10-CM | POA: Insufficient documentation

## 2011-12-03 DIAGNOSIS — R443 Hallucinations, unspecified: Secondary | ICD-10-CM | POA: Insufficient documentation

## 2011-12-03 DIAGNOSIS — F3130 Bipolar disorder, current episode depressed, mild or moderate severity, unspecified: Secondary | ICD-10-CM

## 2011-12-03 DIAGNOSIS — R279 Unspecified lack of coordination: Secondary | ICD-10-CM | POA: Insufficient documentation

## 2011-12-03 DIAGNOSIS — R7889 Finding of other specified substances, not normally found in blood: Secondary | ICD-10-CM

## 2011-12-03 LAB — URINALYSIS, ROUTINE W REFLEX MICROSCOPIC
Hgb urine dipstick: NEGATIVE
Ketones, ur: NEGATIVE mg/dL
Protein, ur: NEGATIVE mg/dL
Urobilinogen, UA: 0.2 mg/dL (ref 0.0–1.0)

## 2011-12-03 LAB — CBC
HCT: 34.7 % — ABNORMAL LOW (ref 39.0–52.0)
Hemoglobin: 11.3 g/dL — ABNORMAL LOW (ref 13.0–17.0)
MCV: 106.8 fL — ABNORMAL HIGH (ref 78.0–100.0)
RBC: 3.25 MIL/uL — ABNORMAL LOW (ref 4.22–5.81)
WBC: 2.4 10*3/uL — ABNORMAL LOW (ref 4.0–10.5)

## 2011-12-03 LAB — COMPREHENSIVE METABOLIC PANEL
AST: 57 U/L — ABNORMAL HIGH (ref 0–37)
Alkaline Phosphatase: 77 U/L (ref 39–117)
BUN: 9 mg/dL (ref 6–23)
CO2: 29 mEq/L (ref 19–32)
Chloride: 104 mEq/L (ref 96–112)
Creatinine, Ser: 0.83 mg/dL (ref 0.50–1.35)
GFR calc non Af Amer: >90 mL/min (ref >90)
Total Bilirubin: 0.6 mg/dL (ref 0.3–1.2)

## 2011-12-03 LAB — DIFFERENTIAL
Basophils Relative: 0 % (ref 0–1)
Eosinophils Relative: 2 % (ref 0–5)
Lymphs Abs: 0.7 10*3/uL (ref 0.7–4.0)
Monocytes Relative: 12 % (ref 3–12)
Neutro Abs: 1.4 10*3/uL — ABNORMAL LOW (ref 1.7–7.7)

## 2011-12-03 LAB — RAPID URINE DRUG SCREEN, HOSP PERFORMED
Opiates: POSITIVE — AB
Tetrahydrocannabinol: NOT DETECTED

## 2011-12-03 MED ORDER — LAMOTRIGINE 150 MG PO TABS
150.0000 mg | ORAL_TABLET | Freq: Two times a day (BID) | ORAL | Status: DC
  Administered 2011-12-03 – 2011-12-05 (×4): 150 mg via ORAL
  Filled 2011-12-03 (×6): qty 1

## 2011-12-03 MED ORDER — CLONIDINE HCL 0.1 MG PO TABS
0.3000 mg | ORAL_TABLET | Freq: Two times a day (BID) | ORAL | Status: DC
Start: 2011-12-03 — End: 2011-12-05
  Administered 2011-12-03 – 2011-12-05 (×4): 0.3 mg via ORAL
  Filled 2011-12-03: qty 3
  Filled 2011-12-03: qty 2
  Filled 2011-12-03: qty 3
  Filled 2011-12-03: qty 1
  Filled 2011-12-03: qty 3

## 2011-12-03 MED ORDER — LAMOTRIGINE 150 MG PO TABS
300.0000 mg | ORAL_TABLET | Freq: Every day | ORAL | Status: DC
  Filled 2011-12-03: qty 2

## 2011-12-03 MED ORDER — AMLODIPINE BESYLATE 5 MG PO TABS
5.0000 mg | ORAL_TABLET | Freq: Every day | ORAL | Status: DC
Start: 2011-12-04 — End: 2011-12-05
  Administered 2011-12-04 – 2011-12-05 (×2): 5 mg via ORAL
  Filled 2011-12-03 (×2): qty 1

## 2011-12-03 MED ORDER — LORAZEPAM 2 MG/ML IJ SOLN
1.0000 mg | Freq: Once | INTRAMUSCULAR | Status: AC
  Administered 2011-12-03: 1 mg via INTRAMUSCULAR
  Filled 2011-12-03: qty 1

## 2011-12-03 MED ORDER — LITHIUM CARBONATE 300 MG PO CAPS
600.0000 mg | ORAL_CAPSULE | Freq: Two times a day (BID) | ORAL | Status: DC
  Administered 2011-12-03: 600 mg via ORAL
  Filled 2011-12-03: qty 2

## 2011-12-03 MED ORDER — BUPROPION HCL ER (SR) 100 MG PO TB12
100.0000 mg | ORAL_TABLET | Freq: Every day | ORAL | Status: DC
  Administered 2011-12-04 – 2011-12-05 (×2): 100 mg via ORAL
  Filled 2011-12-03 (×2): qty 1

## 2011-12-03 MED ORDER — BUPROPION HCL 100 MG PO TABS
100.0000 mg | ORAL_TABLET | Freq: Every morning | ORAL | Status: DC
  Filled 2011-12-03: qty 1

## 2011-12-03 MED ORDER — ATENOLOL 50 MG PO TABS
50.0000 mg | ORAL_TABLET | Freq: Two times a day (BID) | ORAL | Status: DC
  Administered 2011-12-03 – 2011-12-05 (×4): 50 mg via ORAL
  Filled 2011-12-03 (×6): qty 1

## 2011-12-03 MED ORDER — LAMOTRIGINE 200 MG PO TABS
200.0000 mg | ORAL_TABLET | Freq: Every day | ORAL | Status: DC
  Filled 2011-12-03: qty 1

## 2011-12-03 MED ORDER — NICOTINE 14 MG/24HR TD PT24
14.0000 mg | MEDICATED_PATCH | Freq: Every day | TRANSDERMAL | Status: DC
  Administered 2011-12-04 – 2011-12-05 (×2): 14 mg via TRANSDERMAL
  Filled 2011-12-03 (×2): qty 1

## 2011-12-03 MED ORDER — LORAZEPAM 1 MG PO TABS
1.0000 mg | ORAL_TABLET | Freq: Three times a day (TID) | ORAL | Status: DC | PRN
  Administered 2011-12-03: 1 mg via ORAL
  Filled 2011-12-03: qty 1

## 2011-12-03 MED ORDER — LITHIUM CARBONATE 300 MG PO CAPS: 600.0000 mg | ORAL_CAPSULE | Freq: Two times a day (BID) | ORAL | Status: DC

## 2011-12-03 MED ORDER — MIRTAZAPINE 15 MG PO TBDP
15.0000 mg | ORAL_TABLET | Freq: Every day | ORAL | Status: DC
  Administered 2011-12-03 – 2011-12-05 (×2): 15 mg via ORAL
  Filled 2011-12-03 (×4): qty 1

## 2011-12-03 NOTE — ED Provider Notes (Signed)
History     CSN: [id number]  Arrival date & time 12/03/11  1041   First MD Initiated Contact with Patient 12/03/11 1055      Chief Complaint  Patient presents with  . Hallucinations    recently had lithium dose change    (Consider location/radiation/quality/duration/timing/severity/associated sxs/prior treatment) HPI Comments: Patient with a history of Bipolar Disorder comes in today with a chief complaint of hallucinations, jerking of his muscles, and confusion.  He was recently admitted to the hospital 4 days ago for the same complaint and was discharged 2 days ago.  He had a negative head CT at that time.  During his admission both Psychiatry and Neurology were consulted.  Both Psychiatry and Neurology felt that patients symptoms were related to an interaction between Wellbutrin and the Lithium and Lamictal that he is on for his Bipolar.  He was started on Wellbutrin 11/19/11 by his Psychiatrist Dr. Ladona Ridgel.  They began weaning him off of the Wellbutrin and wife reports that his Lithium dose had been increased.  Wife reports that he has had confusion, rhythmic jerking of his muscles, and both auditory and visual hallucinations since his discharge.  He has not followed up with his Psychiatrist yet.  Wife reports that he has been taking the medication as directed.  The history is provided by the spouse and medical records (son).    Past Medical History  Diagnosis Date  . Tachycardia   . Peripheral neuropathy   . Alcohol abuse     hx of quit 4/04  . Bilateral external ear infections 2007  . Tobacco abuse   . Bipolar affective disorder     Followed by Dr. [name]  . Avascular necrosis     SP right and left hip replacements and L shoulder arthroplasty, secondary to steroid use.  Has been on embrel in past.  . Psoriasis     Previously on embrel, stopped because of price.   . Hypertension   . Hepatitis C   . ARF (acute renal failure) 2008    Multifactorial in setting of over use of  ibuprofen and HCTZ./ACEI    Past Surgical History  Procedure Date  . Right total hip replacement 2004    Due to AVN  . Left total hip replacement 2001    Due to AVN  . Closed manipulation, pinning, application of a traction pin right 2002  . Right shoulder arthroplasty     Due to AVN  . Appendectomy 1989    Family History  Problem Relation Age of Onset  . Hypertension Mother   . Bipolar disorder Father   . Obesity Father   . Obesity Brother     History  Substance Use Topics  . Smoking status: Current Everyday Smoker    Types: Cigarettes  . Smokeless tobacco: Never Used   Comment: Not interested in stopping.  . Alcohol Use: No     Previously alcoholic, on and off but appears to has stopped since 12/09      Review of Systems  Constitutional: Negative for fever and chills.  Eyes: Negative for visual disturbance.  Respiratory: Negative for shortness of breath.   Gastrointestinal: Negative for nausea and vomiting.  Skin: Negative for rash.  Neurological: Positive for tremors and weakness. Negative for dizziness, syncope, light-headedness and headaches.  Psychiatric/Behavioral: Positive for hallucinations and confusion. Negative for suicidal ideas.    Allergies  Erythromycin; Fentanyl citrate; Hydromorphone hcl; and Prochlorperazine maleate  Home Medications   Current Outpatient Rx  Name Route Sig Dispense Refill  . AMLODIPINE BESYLATE 5 MG PO TABS Oral Take 1 tablet (5 mg total) by mouth daily. 30 tablet 0  . ATENOLOL 25 MG PO TABS Oral Take 50 mg by mouth 2 (two) times daily.    . BUPROPION HCL ER (SR) 100 MG PO TB12 Oral Take 1 tablet (100 mg total) by mouth daily. For 7 days and then stop 7 tablet 0  . CLOBETASOL PROPIONATE 0.05 % EX SOLN Topical Apply 1 application topically 2 (two) times daily. Applied to sensitive areas for psoriasis.    Marland Kitchen CLONIDINE HCL 0.3 MG PO TABS Oral Take 1 tablet (0.3 mg total) by mouth 2 (two) times daily. 60 tablet 5  . ETANERCEPT 50  MG/ML [place] SOLN Subcutaneous Inject 25 mg into the skin 2 (two) times a week. Given on Tuesdays and Saturdays.    Marland Kitchen GABAPENTIN 300 MG PO CAPS Oral Take 300 mg by mouth 3 (three) times daily.    Marland Kitchen LAMOTRIGINE 200 MG PO TABS Oral Take 200-300 mg by mouth 2 (two) times daily. Take 1.5 tab in am, 1 tab in pm    . LITHIUM CARBONATE 600 MG PO CAPS Oral Take 600-1,200 mg by mouth 2 (two) times daily with a meal. Take 2 tab in am, 1 in pm    . NICOTINE 14 MG/24HR TD PT24 Transdermal Place 1 patch onto the skin daily. 28 patch 0  . SILDENAFIL CITRATE 50 MG PO TABS Oral Take 50 mg by mouth daily as needed. Take 1 tab by mouth 1 hour prior to sexual activity.    . TRAZODONE HCL 100 MG PO TABS Oral Take 100 mg by mouth 2 (two) times daily.       BP 111/93  Pulse 60  Temp(Src) 97.9 F (36.6 C) (Oral)  Resp 18  SpO2 98%  Physical Exam  Nursing note and vitals reviewed. Constitutional: He is oriented to person, place, and time. He appears well-developed and well-nourished. No distress.  HENT:  Head: Normocephalic and atraumatic.  Mouth/Throat: Oropharynx is clear and moist.  Eyes: Pupils are equal, round, and reactive to light.  Neck: Normal range of motion. Neck supple.  Cardiovascular: Normal rate, regular rhythm and normal heart sounds.   Pulmonary/Chest: Effort normal and breath sounds normal. No respiratory distress. He has no wheezes. He has no rales. He exhibits no tenderness.  Musculoskeletal: Normal range of motion. He exhibits no edema and no tenderness.  Neurological: He is oriented to person, place, and time. No cranial nerve deficit. Coordination and gait abnormal.       Patient orientated to person, place, and time.   Skin: Skin is warm and dry. No rash noted. He is not diaphoretic.  Psychiatric: He is actively hallucinating. Cognition and memory are impaired.    ED Course  Procedures (including critical care time)  Labs Reviewed - No data to display No results found.   No  diagnosis found.  Discussed patient with Dr. Effie Shy.  He recommends having ACT team evaluate patient.    11:27 AM Discussed with ACT team.  They report that they will come see patient in the ED and evaluate.  12:15 Pm Patient apparently fell out of bed and was becoming restless.  Patient was ordered ativan IM and soft restraints.  Reassessed patient.  Head atraumatic, full ROM of all the extremities, no tenderness to palpation of all extremities.   2:00 PM Reassessed patient.  Patient is sleeping at this time.  Awaiting  ACT evaluation. 3:30 PM Reassessed patient.  Patient's son is currently there.  Patient's son reports that the patient has had a gradual decline in cognitive function over the past year and is concerned that patient is not taking his medication correctly. 4:00PM Patient evaluated by psychiatrist Dr. Elsie Saas and ACT team.  Psychiatrist is recommending having patient admitted to [facility].  ACT team working on placement.   5:30 PM Notified that patient was declined admission at [facility] and the Geriatric psych facility in [place]. 7:30 PM Discussed with Dr. Nedra Hai with [facility].  Dr. Nedra Hai reports that patient does not meet inpatient criteria for admission and is requesting social work consult.  Will consult Social Work. 8:30 PM Discussed with social work.  They report that they will speak with Dr. Nedra Hai about admission and placement issue.  9:00 PM Discussed with Social work.  She reports that she will fill out paperwork to have patient admitted to [facility], but the patient will need to stay in the ED until he is accepted by [facility].  Patient unable to go to the Psych ED because of his fall risk and inability to ambulate.   9:40 PM Discussed plan with patient's son.  Patient's son in agreement with plan.  9:45 PM Patient signed out to Loren Racer, MD.    MDM  Patient presenting with visual and auditory hallucinations,  confusion, and rhythmic jerking of his muscles.  Patient admitted for the same thing 4 days ago and discharged 2 days ago.  Son reports progressive increase in confusion over the past year.  Patient evaluated by psychiatrist, social work, ACT team.  Patient pending placement at [facility].        Pascal Lux [place], [place] 12/04/11 1150

## 2011-12-03 NOTE — ED Notes (Signed)
Pt noted to be trying to get out of bed, pt redirectable and told to stay in bed, pt again started climbing out of bed and slipped on end of bed, pt witness to slide to ground by Forde Dandy, to assisted back to bed and denies complaints, states he is sorry for getting up, MD aware, pt repositioned in bed

## 2011-12-03 NOTE — ED Notes (Signed)
Pt still restless on stretcher with wrist restraints on. Waist roll belt restaint applied per policy.  Wrist restraints released. Pt repositioned in bed, covered with sheet and verbal reassurance given.

## 2011-12-03 NOTE — ED Notes (Addendum)
Wife, [name] updated by phone on pt. Wife may be reached at [phone] or [phone].

## 2011-12-03 NOTE — BH Assessment (Signed)
Evaluated by Dr. Henrene Hawking and he recommended a Geri Psych in-pt referral. However, pt does not meet age requirement for Geri Psych and most facilities require patient to be able to complete basic ADL's.  Declined at [facility] due to inability to complete ADL's. [place] declined due to age. Pt must be 55 or older.   **Declined at [facility] due to inability to complete ADL's. [place] declined due to age. Pt must be 55 or older.   Writer spoke with EDP-Dr. Agustin Cree regarding a medical admit. Patient is pending a medical admit at this time.

## 2011-12-03 NOTE — ED Notes (Signed)
No labs ordered at this time. Clarified this with Pascal Lux Wingen PA.

## 2011-12-03 NOTE — Consult Note (Signed)
Reason for Consult: Bipolar disorder with the altered mental status Referring Physician: [name] is an 51 y.o. male.  HPI: This is a known patient from the consultation on Friday for bipolar disorder with the medication management. Patient was seen, and case discussed with his brother who was at bed side and with his wife on the phone. Patient has chronic history of bipolar disorder and receiving treatment from [facility] and his current psychiatrist Dr. Ladona Melendez. He was receiving lithium and lamictal over several years. He was started antidepressant medication because he was severely depressed, unable to function, loss of appetite and weight. Reportedly he lost about 40 pounds from 180 within few months.   He was initially given a trial off SSRI prozac which was not helpful and caused sedation and than he  was given a trail of wellbutrin 200 mg about a month ago. He was presented on Friday with leg weakness and frequent falls and possible seizure activity. Reportedly his depression was better, able to keep himself awake, off of the bed and able to participate small task until he started deteriorating.   Patient patient was discharged home on Sunday from the medical floor. Patient wife reported that he was unable to sleep on Sunday after going home. His medication neurontin in and trazodone was not administered as prescribed because of concern of oversedation. Patient was received his medication including trazodone this morning before he was admitted to the emergency department. Patient was received Ativan intramuscular shot on arrival and unable to participate in psychiatric evaluation during this visit. His Wellbutrin was decreased from 400 mg to 100 mg on Friday with the the plan of discontinuing in 1 weeks time.   Patient was appeared on his stretcher lying on his the left side and sleeping. Patient was not not able to wake up even with the command or touch. Patient does not  appear to be in distress during this visit.   Past Medical History  Diagnosis Date  . Tachycardia   . Peripheral neuropathy   . Alcohol abuse     hx of quit 4/04  . Bilateral external ear infections 2007  . Tobacco abuse   . Bipolar affective disorder     Followed by Dr. Hortencia Melendez  . Avascular necrosis     SP right and left hip replacements and L shoulder arthroplasty, secondary to steroid use.  Has been on embrel in past.  . Psoriasis     Previously on embrel, stopped because of price.   . Hypertension   . Hepatitis C   . ARF (acute renal failure) 2008    Multifactorial in setting of over use of ibuprofen and HCTZ./ACEI    Past Surgical History  Procedure Date  . Right total hip replacement 2004    Due to AVN  . Left total hip replacement 2001    Due to AVN  . Closed manipulation, pinning, application of a traction pin right 2002  . Right shoulder arthroplasty     Due to AVN  . Appendectomy 1989    Family History  Problem Relation Age of Onset  . Hypertension Mother   . Bipolar disorder Father   . Obesity Father   . Obesity Brother     Social History:  reports that he has been smoking Cigarettes.  He has never used smokeless tobacco. He reports that he does not drink alcohol or use illicit drugs.  Allergies:  Allergies  Allergen Reactions  . Erythromycin  REACTION: GI upset  . Fentanyl Citrate     REACTION: itching  . Hydromorphone Hcl     REACTION: itching  . Prochlorperazine Maleate     REACTION: jaw lock    Medications: I have reviewed the patient's current medications.  Results for orders placed during the hospital encounter of 12/03/11 (from the past 48 hour(s))  GLUCOSE, CAPILLARY     Status: Normal   Collection Time   12/03/11  3:59 PM      Component Value Range Comment   Glucose-Capillary 80  70 - 99 (mg/dL)     Dg Chest 1 View  1/61/0960  *RADIOLOGY REPORT*  Clinical Data: Hallucinations.  CHEST - 1 VIEW  Comparison: 08/05/2007   Findings: Cardiomegaly with mild vascular congestion.  No overt edema.  No confluent opacities or effusions.  No acute bony abnormality.  IMPRESSION: Cardiomegaly, vascular congestion.  Original Report Authenticated By: Cyndie Chime, M.D.    Positive for anorexia, bad mood, bipolar and depression Blood pressure 129/78, pulse 61, temperature 99.1 F (37.3 C), temperature source Oral, resp. rate 18, SpO2 96.00%.   Assessment/Plan: Altered mental status Bipolar disorder most recent episode depression Noncompliance with medications management  Patient needed medical unit bed who because he was not able to ambulate to be admitted to the psychiatric floor, with psychiatric consultation for stabilizing on medication management for bipolar disorder.  [name],JANARDHAHA R. 12/03/2011, 5:03 PM

## 2011-12-03 NOTE — ED Notes (Addendum)
Brother, [name] at bedside. May be called if needed [phone].

## 2011-12-03 NOTE — BH Assessment (Signed)
Assessment Note   [name] is an 51 y.o. male. Patient with h/o of bipolar disorder maintained on Lithium and Lamictal by [facility] Dr. Ladona Ridgel. He was started on Prozac 6- 7 weeks ago for depression this caused him to be sleepy and lethargic, subsequently this was discontinued on 4/16 and was started on Wellbutrin instead. Since starting wellbutrin wife reports intermittent confusion, hallucinations, double vision, abnormal involuntary movements with his legs buckling, unsteadiness. Patient brought here to [facility] last 4/26 Friday and admitted to the medical floor for further evaluation. Patient discharged Sunday 4/28. This am he woke up, his walk was noted as unsteady then he fell down, experienced some spasms and involuntary movements involving his lower extremities greater than uppers, then curled up in a fetal position. Patients spouse brought patient back to [facility] today for another evaluation. Patient medically cleared by EDP. Writer called to complete a [facility] assessment. Upon entering the room patient was not alert. Nursing staff sts that patient was given medication to calm his anxiety and this most likely has caused him to sleep. Nursing staff attempted to wake patient for his assessment, however; pt would not respond. Patient nurse sts that she was unable to gather information from patient stating, "he speaks but doesn't really make much sense..I couldn't get much out of him". Information state above obtained from patients spouse [phone] or [phone].  Spoke to psychiatrist-Dr. Oneta Rack regarding patients status and potential disposition. He suggested in-pt Geri Psych placement. Writer contacted [facility] intake department and they will not consider patient due to his age-68; patient must be 55 and older. Patient then referred to [facility] ([place] however; declined due to patients lack of ability to complete basic ADL's.   Discussed the  above issues with EDP-Dr. Agustin Cree. Writer made EDP aware that facilities are reluctant to consider patient due to his inability to complete his ADL's. He also does not meet age requirements for Geri-psych units, which is 69 or older. Dr. Agustin Cree sts that he will look into possible medically admitting patient.    Axis I: Substance Induced Mood Disorder Axis II: Deferred Axis III:  Past Medical History  Diagnosis Date  . Tachycardia   . Peripheral neuropathy   . Alcohol abuse     hx of quit 4/04  . Bilateral external ear infections 2007  . Tobacco abuse   . Bipolar affective disorder     Followed by Dr. [name]  . Avascular necrosis     SP right and left hip replacements and L shoulder arthroplasty, secondary to steroid use.  Has been on embrel in past.  . Psoriasis     Previously on embrel, stopped because of price.   . Hypertension   . Hepatitis C   . ARF (acute renal failure) 2008    Multifactorial in setting of over use of ibuprofen and HCTZ./ACEI   Axis IV: problems with access to health care services Axis V: 11-20 some danger of hurting self or others possible OR occasionally fails to maintain minimal personal hygiene OR gross impairment in communication  Past Medical History:  Past Medical History  Diagnosis Date  . Tachycardia   . Peripheral neuropathy   . Alcohol abuse     hx of quit 4/04  . Bilateral external ear infections 2007  . Tobacco abuse   . Bipolar affective disorder     Followed by Dr. [name]  . Avascular necrosis     SP right and left hip replacements and  L shoulder arthroplasty, secondary to steroid use.  Has been on embrel in past.  . Psoriasis     Previously on embrel, stopped because of price.   . Hypertension   . Hepatitis C   . ARF (acute renal failure) 2008    Multifactorial in setting of over use of ibuprofen and HCTZ./ACEI    Past Surgical History  Procedure Date  . Right total hip replacement 2004    Due to AVN  . Left total hip  replacement 2001    Due to AVN  . Closed manipulation, pinning, application of a traction pin right 2002  . Right shoulder arthroplasty     Due to AVN  . Appendectomy 1989    Family History:  Family History  Problem Relation Age of Onset  . Hypertension Mother   . Bipolar disorder Father   . Obesity Father   . Obesity Brother     Social History:  reports that he has been smoking Cigarettes.  He has never used smokeless tobacco. He reports that he does not drink alcohol or use illicit drugs.  Additional Social History:  Alcohol / Drug Use Pain Medications: Patient unable to provide proper details due to current mental state.  Prescriptions: Patient unable to provide proper details due to current mental state.  Over the Counter: Patient unable to provide proper details due to current mental state. History of alcohol / drug use?: No history of alcohol / drug abuse (no noted history; pt unable to provide details due to mental) Longest period of sobriety (when/how long): n/a Allergies:  Allergies  Allergen Reactions  . Erythromycin     REACTION: GI upset  . Fentanyl Citrate     REACTION: itching  . Hydromorphone Hcl     REACTION: itching  . Prochlorperazine Maleate     REACTION: jaw lock    Home Medications:  (Not in a hospital admission)  OB/GYN Status:  No LMP for male patient.  General Assessment Data Location of Assessment: [facility] Living Arrangements: Spouse/significant other Can pt return to current living arrangement?: Yes Admission Status:  (will need IVC if placed in facility-unable to make decision) Is patient capable of signing voluntary admission?: No Transfer from: Acute Hospital Referral Source:  (pt brought in by spouse)  Education Status Is patient currently in school?: No  Risk to self Suicidal Ideation: No (pt unable to confirm and/or deny due to current mental statu) Suicidal Intent: No Is patient at risk for suicide?: No Suicidal Plan?:  No Access to Means: No Specify Access to Suicidal Means:  (n/a) What has been your use of drugs/alcohol within the last 12 months?:  (no noted drug or alcohol use noted) Previous Attempts/Gestures:  (unk; pt unable to confirm or deny) How many times?:  (0) Other Self Harm Risks:  (unk) Triggers for Past Attempts: Unknown Intentional Self Injurious Behavior: None Family Suicide History: Unknown Recent stressful life event(s):  (unable to verify w/ patient; spouse denies) Persecutory voices/beliefs?: No Depression:  (per spouse no history; pt unable to provide details) Substance abuse history and/or treatment for substance abuse?: No Suicide prevention information given to non-admitted patients: Not applicable  Risk to Others Homicidal Ideation: No Thoughts of Harm to Others: No Current Homicidal Intent: No Current Homicidal Plan: No Access to Homicidal Means: No Identified Victim:  (n/a) History of harm to others?: No Assessment of Violence: None Noted Violent Behavior Description:  (patient calm and cooperative) Does patient have access to weapons?: No Criminal Charges  Pending?: No Does patient have a court date: No  Psychosis Hallucinations:  (noted symptoms of aud/vis halluc's-pt unable to provide info) Delusions: Unspecified (noted sytmptoms of AVH's-pt unable to provide details)  Mental Status Report Appear/Hygiene: Disheveled Eye Contact: Poor Motor Activity: Psychomotor retardation Speech: Slurred;Incoherent (pt makes verbal cues, however unable to understand pt) Level of Consciousness: Sleeping;Drowsy (patient given meds; not alert when writer tried to assess) Mood:  (per nurse/spous pt mostly preoccupied; not in touch w/ reali) Affect: Inconsistent with thought content (information provided by spouse-inconsistent w/ thought conte) Anxiety Level:  (pt unable to provide response; no noted history) Thought Processes: Circumstantial;Irrelevant (per spouse "he reminds me  of someone with demential") Judgement: Impaired Orientation: Not oriented;Unable to assess (no oriented per spouse; unable to assess-pt asleep) Obsessive Compulsive Thoughts/Behaviors: None  Cognitive Functioning Concentration: Decreased Memory: Recent Impaired;Remote Impaired IQ: Average Insight: Poor Impulse Control:  (unk) Appetite: Fair (pt able to eat with direction; eats with fingers with direct) Weight Loss:  (unk) Weight Gain:  (unk) Sleep: Decreased Total Hours of Sleep:  (0) Vegetative Symptoms: None  Prior Inpatient Therapy Prior Inpatient Therapy:  (pt unable to provide details; spouse denies prior hx) Prior Therapy Dates:  (n/a) Prior Therapy Facilty/Provider(s):  (n/a) Reason for Treatment:  (n/a)  Prior Outpatient Therapy Prior Outpatient Therapy: Yes Prior Therapy Dates:  (current patient at [facility] health) Prior Therapy Facilty/Provider(s):  [facility] mental health) Reason for Treatment:  (medication management-Bipolar disorder)  ADL Screening (condition at time of admission) Patient's cognitive ability adequate to safely complete daily activities?: No Patient able to express need for assistance with ADLs?: Yes Independently performs ADLs?: No Communication: Needs assistance (answers orientation questions but doesn't communicate needs) Is this a change from baseline?:  (change began 6-7 weeks ago) Dressing (OT): Dependent Is this a change from baseline?: Change from baseline, expected to last >3 days (change occurred 6-7 weeks ago) Grooming: Dependent;Needs assistance Is this a change from baseline?: Change from baseline, expected to last >3 days (change occurred 6-7 weeks ago) Bathing: Needs assistance (eats with fingers with direction; unable to use prorper uten) Is this a change from baseline?: Change from baseline, expected to last >3 days (change began 6-7 weeks ago) Toileting: Dependent (patient incontinent; spouse reports pt urinating in  bed) Is this a change from baseline?:  (change began 6-7 weeks ago) In/Out Bed: Needs assistance (patient's spouse reports assisting pt with in/out of beds) Is this a change from baseline?:  (changes started 6-7 weeks ago) Walks in Home: Dependent (pt unable to walk w/o stumbling; wheel chair may be needed) Weakness of Legs: Both Weakness of Arms/Hands:  (unk)  Home Assistive Devices/Equipment Home Assistive Devices/Equipment:  (unknown if patient requires home assistive devices)    Abuse/Neglect Assessment (Assessment to be complete while patient is alone) Physical Abuse: Denies Verbal Abuse: Denies Sexual Abuse: Denies Exploitation of patient/patient's resources: Denies Self-Neglect: Denies Values / Beliefs Cultural Requests During Hospitalization: None Spiritual Requests During Hospitalization: None   Advance Directives (For Healthcare) Advance Directive: Patient does not have advance directive Pre-existing out of facility DNR order (yellow form or pink MOST form): Other (comment) (unk) Nutrition Screen Diet: Regular Unintentional weight loss greater than 10lbs within the last month: No Problems chewing or swallowing foods and/or liquids: No Home Tube Feeding or Total Parenteral Nutrition (TPN): No Patient appears severely malnourished: No  Additional Information 1:1 In Past 12 Months?:  (possible 1:1 on the med floor-recenlty admitted to medical) CIRT Risk: No Elopement Risk: No Does  patient have medical clearance?: Yes     Disposition:  Disposition Disposition of Patient: Other dispositions (Discussed medical admit with EDP due to pt's decreased ADL's) Other disposition(s):  (Pending medical admit)  On Site Evaluation by:   Reviewed with Physician:     Melynda Ripple [facility] 12/03/2011 4:57 PM

## 2011-12-03 NOTE — ED Notes (Signed)
[id number]<AV> Expected date:12/03/11<BR> Expected time:10:16 AM<BR> Means of arrival:Ambulance<BR> Comments:<BR> 51yo/hallucinations

## 2011-12-03 NOTE — ED Notes (Signed)
Pt is from home. Wife called for transport to hospital because pt is having unsteady gait and hallucinations since lithium was increased 2 days ago.

## 2011-12-04 LAB — LITHIUM LEVEL
Lithium Lvl: 1.81 mEq/L (ref 0.80–1.40)
Lithium Lvl: 1.58 mEq/L (ref 0.80–1.40)

## 2011-12-04 MED ORDER — SODIUM CHLORIDE 0.9 % IV BOLUS (SEPSIS)
1000.0000 mL | INTRAVENOUS | Status: AC
  Administered 2011-12-04: 1000 mL via INTRAVENOUS

## 2011-12-04 MED ORDER — LITHIUM CARBONATE 300 MG PO CAPS
600.0000 mg | ORAL_CAPSULE | Freq: Every morning | ORAL | Status: DC
  Administered 2011-12-05: 600 mg via ORAL
  Filled 2011-12-04: qty 2

## 2011-12-04 MED ORDER — LITHIUM CARBONATE 300 MG PO CAPS: 300.0000 mg | ORAL_CAPSULE | Freq: Every day | ORAL | Status: DC

## 2011-12-04 MED ORDER — ACETAMINOPHEN 325 MG PO TABS: 650.0000 mg | ORAL_TABLET | ORAL | Status: DC | PRN

## 2011-12-04 MED ORDER — ONDANSETRON HCL 4 MG PO TABS: 4.0000 mg | ORAL_TABLET | Freq: Three times a day (TID) | ORAL | Status: DC | PRN

## 2011-12-04 MED ORDER — ZIPRASIDONE MESYLATE 20 MG IM SOLR: 20.0000 mg | Freq: Four times a day (QID) | INTRAMUSCULAR | Status: DC | PRN

## 2011-12-04 MED ORDER — LORAZEPAM 1 MG PO TABS: 1.0000 mg | ORAL_TABLET | Freq: Three times a day (TID) | ORAL | Status: DC | PRN

## 2011-12-04 NOTE — Consult Note (Signed)
Reason for Consult: Bipolar disorder, altered mental status secondary to lithium intoxication Referring Physician: Dr. [name] is an 51 y.o. male.  HPI: Patient was seen, chart reviewed and case discussed with the emergency department physician,  the patient wife and his brother. His labs and medications were reviewed. His initial lithium level was 1.81 and the later reduced 1.58 and latest one is 1.27. Patient home lithium doses was 300 mg 2 pills in the morning and one pill at night as per Dr. Ladona Melendez at [facility], but he received 600 mg 2 pills in the morning and 1 pill at bedtime as instruction at the time of medical discharge on Sunday. Patient wife reported that she was given lithium as dieted by his primary psychiatrist Dr. Ladona Melendez. It is unclear how his lithium level was at toxic level on arrival. Patient wife believes that he might have confused and taken medication second time in the same day. Patient has been somewhat paranoid, delusional and a have a hallucinations. Reportedly he believes his wife is going to leave him somewhere and not to bother him.  Patient was sleepy most of the day but he has been waking up to use the bathroom. He was stable on his feet while walking to the bathroom. He feels better and he is oriented to time place person and situation. He has denied suicidal/homicidal ideations, intentions or plans. He denies current psychotic symptoms like delusions, hallucinations and paranoia.  Past Medical History  Diagnosis Date  . Tachycardia   . Peripheral neuropathy   . Alcohol abuse     hx of quit 4/04  . Bilateral external ear infections 2007  . Tobacco abuse   . Bipolar affective disorder     Followed by Dr. Hortencia Melendez  . Avascular necrosis     SP right and left hip replacements and L shoulder arthroplasty, secondary to steroid use.  Has been on embrel in past.  . Psoriasis     Previously on embrel, stopped because of price.   . Hypertension   .  Hepatitis C   . ARF (acute renal failure) 2008    Multifactorial in setting of over use of ibuprofen and HCTZ./ACEI    Past Surgical History  Procedure Date  . Right total hip replacement 2004    Due to AVN  . Left total hip replacement 2001    Due to AVN  . Closed manipulation, pinning, application of a traction pin right 2002  . Right shoulder arthroplasty     Due to AVN  . Appendectomy 1989    Family History  Problem Relation Age of Onset  . Hypertension Mother   . Bipolar disorder Father   . Obesity Father   . Obesity Brother     Social History:  reports that he has been smoking Cigarettes.  He has a 35 pack-year smoking history. He has never used smokeless tobacco. He reports that he does not drink alcohol or use illicit drugs.  Allergies:  Allergies  Allergen Reactions  . Erythromycin     REACTION: GI upset  . Fentanyl Citrate     REACTION: itching  . Hydromorphone Hcl     REACTION: itching  . Prochlorperazine Maleate     REACTION: jaw lock    Medications: I have reviewed the patient's current medications.  Results for orders placed during the hospital encounter of 12/03/11 (from the past 48 hour(s))  GLUCOSE, CAPILLARY     Status: Normal   Collection Time  12/03/11  3:59 PM      Component Value Range Comment   Glucose-Capillary 80  70 - 99 (mg/dL)   URINALYSIS, ROUTINE W REFLEX MICROSCOPIC     Status: Normal   Collection Time   12/03/11  4:31 PM      Component Value Range Comment   Color, Urine YELLOW  YELLOW     APPearance CLEAR  CLEAR     Specific Gravity, Urine 1.020  1.005 - 1.030     pH 7.5  5.0 - 8.0     Glucose, UA NEGATIVE  NEGATIVE (mg/dL)    Hgb urine dipstick NEGATIVE  NEGATIVE     Bilirubin Urine NEGATIVE  NEGATIVE     Ketones, ur NEGATIVE  NEGATIVE (mg/dL)    Protein, ur NEGATIVE  NEGATIVE (mg/dL)    Urobilinogen, UA 0.2  0.0 - 1.0 (mg/dL)    Nitrite NEGATIVE  NEGATIVE     Leukocytes, UA NEGATIVE  NEGATIVE  MICROSCOPIC NOT DONE ON  URINES WITH NEGATIVE PROTEIN, BLOOD, LEUKOCYTES, NITRITE, OR GLUCOSE <1000 mg/dL.  URINE RAPID DRUG SCREEN (HOSP PERFORMED)     Status: Abnormal   Collection Time   12/03/11  4:31 PM      Component Value Range Comment   Opiates POSITIVE (*) NONE DETECTED     Cocaine NONE DETECTED  NONE DETECTED     Benzodiazepines NONE DETECTED  NONE DETECTED     Amphetamines POSITIVE (*) NONE DETECTED     Tetrahydrocannabinol NONE DETECTED  NONE DETECTED     Barbiturates NONE DETECTED  NONE DETECTED    CBC     Status: Abnormal   Collection Time   12/03/11  4:35 PM      Component Value Range Comment   WBC 2.4 (*) 4.0 - 10.5 (K/uL)    RBC 3.25 (*) 4.22 - 5.81 (MIL/uL)    Hemoglobin 11.3 (*) 13.0 - 17.0 (g/dL)    HCT 16.1 (*) 09.6 - 52.0 (%)    MCV 106.8 (*) 78.0 - 100.0 (fL)    MCH 34.8 (*) 26.0 - 34.0 (pg)    MCHC 32.6  30.0 - 36.0 (g/dL)    RDW 04.5  40.9 - 81.1 (%)    Platelets 68 (*) 150 - 400 (K/uL)   DIFFERENTIAL     Status: Abnormal   Collection Time   12/03/11  4:35 PM      Component Value Range Comment   Neutrophils Relative 57  43 - 77 (%)    Lymphocytes Relative 29  12 - 46 (%)    Monocytes Relative 12  3 - 12 (%)    Eosinophils Relative 2  0 - 5 (%)    Basophils Relative 0  0 - 1 (%)    Neutro Abs 1.4 (*) 1.7 - 7.7 (K/uL)    Lymphs Abs 0.7  0.7 - 4.0 (K/uL)    Monocytes Absolute 0.3  0.1 - 1.0 (K/uL)    Eosinophils Absolute 0.0  0.0 - 0.7 (K/uL)    Basophils Absolute 0.0  0.0 - 0.1 (K/uL)    Smear Review LARGE PLATELETS PRESENT     COMPREHENSIVE METABOLIC PANEL     Status: Abnormal   Collection Time   12/03/11  4:35 PM      Component Value Range Comment   Sodium 137  135 - 145 (mEq/L)    Potassium 4.8  3.5 - 5.1 (mEq/L)    Chloride 104  96 - 112 (mEq/L)    CO2 29  19 - 32 (mEq/L)    Glucose, Bld 74  70 - 99 (mg/dL)    BUN 9  6 - 23 (mg/dL)    Creatinine, Ser 1.61  0.50 - 1.35 (mg/dL)    Calcium 9.7  8.4 - 10.5 (mg/dL)    Total Protein 6.2  6.0 - 8.3 (g/dL)    Albumin 3.4 (*)  3.5 - 5.2 (g/dL)    AST 57 (*) 0 - 37 (U/L)    ALT 52  0 - 53 (U/L)    Alkaline Phosphatase 77  39 - 117 (U/L)    Total Bilirubin 0.6  0.3 - 1.2 (mg/dL)    GFR calc non Af Amer >90  >90 (mL/min)    GFR calc Af Amer >90  >90 (mL/min)   LITHIUM LEVEL     Status: Abnormal   Collection Time   12/04/11 12:00 AM      Component Value Range Comment   Lithium Lvl 1.81 (*) 0.80 - 1.40 (mEq/L)   LITHIUM LEVEL     Status: Abnormal   Collection Time   12/04/11  6:43 AM      Component Value Range Comment   Lithium Lvl 1.58 (*) 0.80 - 1.40 (mEq/L)   LITHIUM LEVEL     Status: Normal   Collection Time   12/04/11 11:40 AM      Component Value Range Comment   Lithium Lvl 1.27  0.80 - 1.40 (mEq/L)     Dg Chest 1 View  12/03/2011  *RADIOLOGY REPORT*  Clinical Data: Hallucinations.  CHEST - 1 VIEW  Comparison: 08/05/2007  Findings: Cardiomegaly with mild vascular congestion.  No overt edema.  No confluent opacities or effusions.  No acute bony abnormality.  IMPRESSION: Cardiomegaly, vascular congestion.  Original Report Authenticated By: Cyndie Chime, M.D.    No anxiety, No psychosis and Positive for bad mood, depression and sleep disturbance Blood pressure 153/78, pulse 55, temperature 98.4 F (36.9 C), temperature source Oral, resp. rate 20, SpO2 96.00%.   Assessment/Plan: Altered mental status with the lithium intoxication Bipolar disorder most recent episode depression Psoriasis chronic  Patient received hydration with the 2 L of normal saline and his lithium level was rechecked which is 1.27 at 11:40 AM today. Patient medication was consolidated after discussing with the his wife and brother. Patient needed take it psychiatric hospitalization for this stabilization.   [name],JANARDHAHA R. 12/04/2011, 4:09 PM

## 2011-12-04 NOTE — ED Provider Notes (Signed)
  Physical Exam  BP 126/71  Pulse 57  Temp(Src) 99.1 F (37.3 C) (Oral)  Resp 16  SpO2 98%  Physical Exam  ED Course  Procedures  MDM Pt with elevated Li levels - has been hallucinating and agitated.  On exam is sleeping but arousable to voice - follows commands.  Li elevated, mild tremor  D/w poison control re: level - hydrate only, recheck level in 6 hours.       Vida Roller, MD 12/04/11 [phone]

## 2011-12-04 NOTE — ED Notes (Signed)
Pt. To be moved to Psych ED

## 2011-12-04 NOTE — Discharge Planning (Signed)
Patient's information was sent to the following facilities. Determinations are shown:  [facility]: Declined due to medical acuity [facility]: Declined due to inability to complete ADLs [facility]: Declined due to medical acuity [facility]: Declined due to medical acuity [facility]: Declined due to medical acuity  Pending: [facility]  CSW completed referral to [facility], faxing and calling in information on patient.  Authorization provided by [place], [id number], 7 days, 12/04/11 - 12/10/11.  IVC paperwork needs to be completed.  Manson Passey Kamil Hanigan ANN S , MSW, LCSWA 12/04/2011 1:52 PM [phone]

## 2011-12-04 NOTE — ED Notes (Signed)
Talked with poison control.  They are recommending another liter bolus of fluid and a repeat lithium level once the liter is in.  Will discuss with Dr. Jeraldine Loots.

## 2011-12-04 NOTE — ED Notes (Addendum)
Pt asking why he is here.  Pt informed of plan of care and easily redirected.

## 2011-12-04 NOTE — ED Notes (Signed)
Pt. Awakened and set up to eat evening meal.

## 2011-12-04 NOTE — ED Notes (Signed)
Unable to move pt to psych ED based on the pt's inability to walk on his own.

## 2011-12-05 ENCOUNTER — Other Ambulatory Visit: Payer: Self-pay

## 2011-12-05 ENCOUNTER — Encounter (HOSPITAL_COMMUNITY): Payer: Self-pay

## 2011-12-05 ENCOUNTER — Inpatient Hospital Stay (HOSPITAL_COMMUNITY)
Admission: AD | Admit: 2011-12-05 | Discharge: 2011-12-10 | DRG: 885 | Disposition: A | Discharge status: Home / Self Care | Payer: Medicare Other | Source: Ambulatory Visit | Attending: Psychiatry | Admitting: Psychiatry

## 2011-12-05 DIAGNOSIS — Z96649 Presence of unspecified artificial hip joint: Secondary | ICD-10-CM

## 2011-12-05 DIAGNOSIS — L408 Other psoriasis: Secondary | ICD-10-CM

## 2011-12-05 DIAGNOSIS — T438X5A Adverse effect of other psychotropic drugs, initial encounter: Secondary | ICD-10-CM | POA: Diagnosis present

## 2011-12-05 DIAGNOSIS — F411 Generalized anxiety disorder: Secondary | ICD-10-CM

## 2011-12-05 DIAGNOSIS — Z9089 Acquired absence of other organs: Secondary | ICD-10-CM

## 2011-12-05 DIAGNOSIS — F319 Bipolar disorder, unspecified: Secondary | ICD-10-CM

## 2011-12-05 DIAGNOSIS — F17200 Nicotine dependence, unspecified, uncomplicated: Secondary | ICD-10-CM

## 2011-12-05 DIAGNOSIS — G609 Hereditary and idiopathic neuropathy, unspecified: Secondary | ICD-10-CM

## 2011-12-05 DIAGNOSIS — K089 Disorder of teeth and supporting structures, unspecified: Secondary | ICD-10-CM

## 2011-12-05 DIAGNOSIS — F1021 Alcohol dependence, in remission: Secondary | ICD-10-CM

## 2011-12-05 DIAGNOSIS — B1710 Acute hepatitis C without hepatic coma: Secondary | ICD-10-CM

## 2011-12-05 DIAGNOSIS — F329 Major depressive disorder, single episode, unspecified: Principal | ICD-10-CM | POA: Diagnosis present

## 2011-12-05 DIAGNOSIS — G259 Extrapyramidal and movement disorder, unspecified: Secondary | ICD-10-CM

## 2011-12-05 DIAGNOSIS — F3132 Bipolar disorder, current episode depressed, moderate: Secondary | ICD-10-CM

## 2011-12-05 DIAGNOSIS — Z96619 Presence of unspecified artificial shoulder joint: Secondary | ICD-10-CM

## 2011-12-05 DIAGNOSIS — Z8679 Personal history of other diseases of the circulatory system: Secondary | ICD-10-CM

## 2011-12-05 DIAGNOSIS — B999 Unspecified infectious disease: Secondary | ICD-10-CM

## 2011-12-05 DIAGNOSIS — F528 Other sexual dysfunction not due to a substance or known physiological condition: Secondary | ICD-10-CM

## 2011-12-05 DIAGNOSIS — M25519 Pain in unspecified shoulder: Secondary | ICD-10-CM

## 2011-12-05 DIAGNOSIS — M8700 Idiopathic aseptic necrosis of unspecified bone: Secondary | ICD-10-CM

## 2011-12-05 DIAGNOSIS — T50901A Poisoning by unspecified drugs, medicaments and biological substances, accidental (unintentional), initial encounter: Secondary | ICD-10-CM

## 2011-12-05 DIAGNOSIS — I10 Essential (primary) hypertension: Secondary | ICD-10-CM

## 2011-12-05 MED ORDER — ETANERCEPT 50 MG/ML ~~LOC~~ SOLN
25.0000 mg | SUBCUTANEOUS | Status: DC
  Filled 2011-12-05: qty 0.98

## 2011-12-05 MED ORDER — TRAZODONE HCL 100 MG PO TABS
100.0000 mg | ORAL_TABLET | Freq: Two times a day (BID) | ORAL | Status: DC
  Administered 2011-12-05 – 2011-12-06 (×2): 100 mg via ORAL
  Filled 2011-12-05 (×7): qty 1

## 2011-12-05 MED ORDER — CLOBETASOL PROPIONATE 0.05 % EX SOLN: 1.0000 "application " | Freq: Two times a day (BID) | CUTANEOUS | Status: DC

## 2011-12-05 MED ORDER — GABAPENTIN 300 MG PO CAPS
300.0000 mg | ORAL_CAPSULE | Freq: Three times a day (TID) | ORAL | Status: DC
  Administered 2011-12-06 (×2): 300 mg via ORAL
  Filled 2011-12-05 (×7): qty 1

## 2011-12-05 MED ORDER — CLOBETASOL PROPIONATE 0.05 % EX CREA
TOPICAL_CREAM | Freq: Two times a day (BID) | CUTANEOUS | Status: DC
  Administered 2011-12-06 – 2011-12-10 (×8): via TOPICAL
  Filled 2011-12-05: qty 15

## 2011-12-05 MED ORDER — MAGNESIUM HYDROXIDE 400 MG/5ML PO SUSP: 30.0000 mL | Freq: Every day | ORAL | Status: DC | PRN

## 2011-12-05 MED ORDER — ACETAMINOPHEN 325 MG PO TABS
650.0000 mg | ORAL_TABLET | Freq: Four times a day (QID) | ORAL | Status: DC | PRN
  Administered 2011-12-07: 650 mg via ORAL

## 2011-12-05 MED ORDER — LAMOTRIGINE 150 MG PO TABS
150.0000 mg | ORAL_TABLET | Freq: Two times a day (BID) | ORAL | Status: DC
  Administered 2011-12-05 – 2011-12-06 (×2): 150 mg via ORAL
  Filled 2011-12-05 (×2): qty 1
  Filled 2011-12-05: qty 6
  Filled 2011-12-05 (×4): qty 1

## 2011-12-05 MED ORDER — CLONIDINE HCL 0.1 MG PO TABS
0.3000 mg | ORAL_TABLET | Freq: Two times a day (BID) | ORAL | Status: DC
Start: 2011-12-05 — End: 2011-12-10
  Administered 2011-12-06 – 2011-12-10 (×8): 0.3 mg via ORAL
  Filled 2011-12-05: qty 42
  Filled 2011-12-05 (×10): qty 1
  Filled 2011-12-05: qty 42
  Filled 2011-12-05 (×2): qty 1

## 2011-12-05 MED ORDER — ATENOLOL 50 MG PO TABS
50.0000 mg | ORAL_TABLET | Freq: Two times a day (BID) | ORAL | Status: DC
  Administered 2011-12-06: 50 mg via ORAL
  Filled 2011-12-05 (×6): qty 1

## 2011-12-05 MED ORDER — NICOTINE 14 MG/24HR TD PT24
14.0000 mg | MEDICATED_PATCH | Freq: Every day | TRANSDERMAL | Status: DC
  Administered 2011-12-06 – 2011-12-10 (×5): 14 mg via TRANSDERMAL
  Filled 2011-12-05 (×8): qty 1

## 2011-12-05 MED ORDER — ALUM & MAG HYDROXIDE-SIMETH 200-200-20 MG/5ML PO SUSP: 30.0000 mL | ORAL | Status: DC | PRN

## 2011-12-05 MED ORDER — TRAZODONE HCL 100 MG PO TABS: 100.0000 mg | ORAL_TABLET | Freq: Every day | ORAL | Status: DC

## 2011-12-05 MED ORDER — AMLODIPINE BESYLATE 5 MG PO TABS
5.0000 mg | ORAL_TABLET | Freq: Every day | ORAL | Status: DC
  Administered 2011-12-06 – 2011-12-10 (×5): 5 mg via ORAL
  Filled 2011-12-05 (×6): qty 1
  Filled 2011-12-05: qty 7

## 2011-12-05 NOTE — ED Provider Notes (Signed)
Filed Vitals:   12/05/11 1005  BP: 108/82  Pulse:   Temp:   Resp:    Pt alert in no distress.  Sitting up talking on the telephone. Has been cleared by the poison center regarding his elevated lithium.  Ready for transfer to [facility]  Celene Kras, MD 12/05/11 [phone]

## 2011-12-05 NOTE — Discharge Planning (Signed)
[facility] called to decline patient due to medical acuity.  Patient remains on [facility] waiting list.  Marlaine Hind ANN S , MSW, LCSWA 12/05/2011 1:38 PM [phone]

## 2011-12-05 NOTE — BH Assessment (Signed)
Spoke with Marylene Land at [facility] who confirms pt is on wait list.   Marjean Donna 12/05/2011 4:47 AM

## 2011-12-05 NOTE — ED Notes (Signed)
Care of pt assumed. Delorise Shiner, NT sitter at bedside. Daughter and son-in-law visiting. Pt interactive. Alexis, Spiritual Care at bedside speaking with pt.

## 2011-12-05 NOTE — BH Assessment (Addendum)
Patient accepted at [facility] for in-pt services. Accepted by  Seward Grater to [place] Room 402- 2 @ 5:30pm.

## 2011-12-05 NOTE — ED Notes (Signed)
Report given to nurse at behavioral health @ [phone]

## 2011-12-05 NOTE — ED Notes (Signed)
[facility]/Poison control called to check on patient and stated that she was closing the case.

## 2011-12-05 NOTE — Consult Note (Signed)
Reason for Consult: Accidental Lithium toxicity and bipolar disorder Referring Physician: Dr. [name] is an 51 y.o. male.  HPI: Patient was seen and chart was reviewed. Patient was diagnosed with accidental lithium toxicity, bipolar disorder, hypertension and psoriasis. He is more awake today, able to eat his BF and lunch and drinking his fluids offered to him. He is able to ambulate to restroom without support. He has no agitation and aggression observed. He was closely observed by tech and his family. He was unable to report from yesterday events and did not recognize the evaluator, even though talked to him twice. He acknowledge feeling confused and having visual hallucinations at the time of arrival. He was asked to talk priest from church, and participated in prayer. He was mildly tempered when his wife given opinion that he needs to be stabilized further at hospital and he took that she does not want to come home and confronted her saying whey she was uncomfortable with him coming home.   Mental status: He was awake, alert, oriented x time, place and person. He has been feeling better and has no abnormal movements. He has been steady on his feet today. He has mild paranoia and denied suicidal or homicidal ideations. He has fair cognitions except memory difficulties.   Past Medical History  Diagnosis Date  . Tachycardia   . Peripheral neuropathy   . Alcohol abuse     hx of quit 4/04  . Bilateral external ear infections 2007  . Tobacco abuse   . Bipolar affective disorder     Followed by Dr. [name]  . Avascular necrosis     SP right and left hip replacements and L shoulder arthroplasty, secondary to steroid use.  Has been on embrel in past.  . Psoriasis     Previously on embrel, stopped because of price.   . Hypertension   . Hepatitis C   . ARF (acute renal failure) 2008    Multifactorial in setting of over use of ibuprofen and HCTZ./ACEI    Past Surgical History    Procedure Date  . Right total hip replacement 2004    Due to AVN  . Left total hip replacement 2001    Due to AVN  . Closed manipulation, pinning, application of a traction pin right 2002  . Right shoulder arthroplasty     Due to AVN  . Appendectomy 1989    Family History  Problem Relation Age of Onset  . Hypertension Mother   . Bipolar disorder Father   . Obesity Father   . Obesity Brother     Social History:  reports that he has been smoking Cigarettes.  He has a 35 pack-year smoking history. He has never used smokeless tobacco. He reports that he does not drink alcohol or use illicit drugs.  Allergies:  Allergies  Allergen Reactions  . Erythromycin     REACTION: GI upset  . Fentanyl Citrate     REACTION: itching  . Hydromorphone Hcl     REACTION: itching  . Prochlorperazine Maleate     REACTION: jaw lock    Medications: I have reviewed the patient's current medications.  Results for orders placed during the hospital encounter of 12/03/11 (from the past 48 hour(s))  GLUCOSE, CAPILLARY     Status: Normal   Collection Time   12/03/11  3:59 PM      Component Value Range Comment   Glucose-Capillary 80  70 - 99 (mg/dL)   URINALYSIS,  ROUTINE W REFLEX MICROSCOPIC     Status: Normal   Collection Time   12/03/11  4:31 PM      Component Value Range Comment   Color, Urine YELLOW  YELLOW     APPearance CLEAR  CLEAR     Specific Gravity, Urine 1.020  1.005 - 1.030     pH 7.5  5.0 - 8.0     Glucose, UA NEGATIVE  NEGATIVE (mg/dL)    Hgb urine dipstick NEGATIVE  NEGATIVE     Bilirubin Urine NEGATIVE  NEGATIVE     Ketones, ur NEGATIVE  NEGATIVE (mg/dL)    Protein, ur NEGATIVE  NEGATIVE (mg/dL)    Urobilinogen, UA 0.2  0.0 - 1.0 (mg/dL)    Nitrite NEGATIVE  NEGATIVE     Leukocytes, UA NEGATIVE  NEGATIVE  MICROSCOPIC NOT DONE ON URINES WITH NEGATIVE PROTEIN, BLOOD, LEUKOCYTES, NITRITE, OR GLUCOSE <1000 mg/dL.  URINE RAPID DRUG SCREEN (HOSP PERFORMED)     Status: Abnormal    Collection Time   12/03/11  4:31 PM      Component Value Range Comment   Opiates POSITIVE (*) NONE DETECTED     Cocaine NONE DETECTED  NONE DETECTED     Benzodiazepines NONE DETECTED  NONE DETECTED     Amphetamines POSITIVE (*) NONE DETECTED     Tetrahydrocannabinol NONE DETECTED  NONE DETECTED     Barbiturates NONE DETECTED  NONE DETECTED    CBC     Status: Abnormal   Collection Time   12/03/11  4:35 PM      Component Value Range Comment   WBC 2.4 (*) 4.0 - 10.5 (K/uL)    RBC 3.25 (*) 4.22 - 5.81 (MIL/uL)    Hemoglobin 11.3 (*) 13.0 - 17.0 (g/dL)    HCT 96.2 (*) 95.2 - 52.0 (%)    MCV 106.8 (*) 78.0 - 100.0 (fL)    MCH 34.8 (*) 26.0 - 34.0 (pg)    MCHC 32.6  30.0 - 36.0 (g/dL)    RDW 84.1  32.4 - 40.1 (%)    Platelets 68 (*) 150 - 400 (K/uL)   DIFFERENTIAL     Status: Abnormal   Collection Time   12/03/11  4:35 PM      Component Value Range Comment   Neutrophils Relative 57  43 - 77 (%)    Lymphocytes Relative 29  12 - 46 (%)    Monocytes Relative 12  3 - 12 (%)    Eosinophils Relative 2  0 - 5 (%)    Basophils Relative 0  0 - 1 (%)    Neutro Abs 1.4 (*) 1.7 - 7.7 (K/uL)    Lymphs Abs 0.7  0.7 - 4.0 (K/uL)    Monocytes Absolute 0.3  0.1 - 1.0 (K/uL)    Eosinophils Absolute 0.0  0.0 - 0.7 (K/uL)    Basophils Absolute 0.0  0.0 - 0.1 (K/uL)    Smear Review LARGE PLATELETS PRESENT     COMPREHENSIVE METABOLIC PANEL     Status: Abnormal   Collection Time   12/03/11  4:35 PM      Component Value Range Comment   Sodium 137  135 - 145 (mEq/L)    Potassium 4.8  3.5 - 5.1 (mEq/L)    Chloride 104  96 - 112 (mEq/L)    CO2 29  19 - 32 (mEq/L)    Glucose, Bld 74  70 - 99 (mg/dL)    BUN 9  6 - 23 (mg/dL)  Creatinine, Ser 0.83  0.50 - 1.35 (mg/dL)    Calcium 9.7  8.4 - 10.5 (mg/dL)    Total Protein 6.2  6.0 - 8.3 (g/dL)    Albumin 3.4 (*) 3.5 - 5.2 (g/dL)    AST 57 (*) 0 - 37 (U/L)    ALT 52  0 - 53 (U/L)    Alkaline Phosphatase 77  39 - 117 (U/L)    Total Bilirubin 0.6  0.3 - 1.2  (mg/dL)    GFR calc non Af Amer >90  >90 (mL/min)    GFR calc Af Amer >90  >90 (mL/min)   LITHIUM LEVEL     Status: Abnormal   Collection Time   12/04/11 12:00 AM      Component Value Range Comment   Lithium Lvl 1.81 (*) 0.80 - 1.40 (mEq/L)   LITHIUM LEVEL     Status: Abnormal   Collection Time   12/04/11  6:43 AM      Component Value Range Comment   Lithium Lvl 1.58 (*) 0.80 - 1.40 (mEq/L)   LITHIUM LEVEL     Status: Normal   Collection Time   12/04/11 11:40 AM      Component Value Range Comment   Lithium Lvl 1.27  0.80 - 1.40 (mEq/L)     Dg Chest 1 View  12/03/2011  *RADIOLOGY REPORT*  Clinical Data: Hallucinations.  CHEST - 1 VIEW  Comparison: 08/05/2007  Findings: Cardiomegaly with mild vascular congestion.  No overt edema.  No confluent opacities or effusions.  No acute bony abnormality.  IMPRESSION: Cardiomegaly, vascular congestion.  Original Report Authenticated By: Cyndie Chime, M.D.    Positive for anxiety, bad mood, bipolar, depression, mood swings, sleep disturbance and lithium toxicity Blood pressure 108/82, pulse 64, temperature 98.1 F (36.7 C), temperature source Oral, resp. rate 16, SpO2 100.00%.   Assessment/Plan: S/P Accidental Lithium toxicity Bipolar disorder. MRE is depression Psoriasis and Hypertension   Recommendation:  Patient was referred to [facility] for further stabilization due to recently treated with his lithium toxicity and adjusted his psychiatric medications on [facility]. Spoke with Ms. Lorin Picket, NP who accepted to [facility] with pending bed available.    [name],Damon R. 12/05/2011, 2:38 PM

## 2011-12-05 NOTE — Progress Notes (Signed)
Voluntary admission for a tremulous 51 year old male with flat affect, depressed mood.  Pt. Reports that he was depressed and was placed on Prozac and it had the opposite affect on him and started hallucinating.  Reports that he was then placed on Wellbutrin and it also had the opposite affect on him.  Reports that he slept all the time and was not able to do anything around the house.  Pt.'s   Had visual hallucinations on arrival to ED and an increased   Lithium level.  Medical History of HTN,Hepatitis,Tackycardia and Peripheral Neuropathy.  Reports psoriasis (on multiple areas of the body with a skin tear the size of a quarter on his right arm).  Pt. Denies SI/HI and denies A/V hallucinations and contracts for safety.   Encouragement and support given.  Pt. Receptive.

## 2011-12-05 NOTE — Progress Notes (Signed)
Patient requested priest to come for anointing. Called patient's church, [employer] [phone]), to request a priest. No one answered but left a message. Offered support and listened. Presence.  12/05/11 1100  Clinical Encounter Type  Visited With Patient and family together  Visit Type Spiritual support;Social support  Referral From Family  Consult/Referral To Faith community  Recommendations Follow up  Spiritual Encounters  Spiritual Needs Prayer;Ritual

## 2011-12-05 NOTE — ED Provider Notes (Signed)
Medical screening examination/treatment/procedure(s) were performed by non-physician practitioner and as supervising physician I was immediately available for consultation/collaboration.  Flint Melter, MD 12/05/11 [phone]

## 2011-12-05 NOTE — Tx Team (Signed)
Initial Interdisciplinary Treatment Plan  PATIENT STRENGTHS: (choose at least two) Ability for insight  PATIENT STRESSORS: Health problems   PROBLEM LIST: Problem List/Patient Goals Date to be addressed Date deferred Reason deferred Estimated date of resolution  Bipolar D/O 12/05/11     Psoriasis 12/05/11     HTN 12/05/11                                          DISCHARGE CRITERIA:  Ability to meet basic life and health needs Improved stabilization in mood, thinking, and/or behavior Medical problems require only outpatient monitoring Verbal commitment to aftercare and medication compliance  PRELIMINARY DISCHARGE PLAN: Return to previous living arrangement  PATIENT/FAMIILY INVOLVEMENT: This treatment plan has been presented to and reviewed with the patient, [name], and/or family member.  The patient and family have been given the opportunity to ask questions and make suggestions.  [name] 12/05/2011, 11:17 PM

## 2011-12-06 DIAGNOSIS — F3132 Bipolar disorder, current episode depressed, moderate: Secondary | ICD-10-CM | POA: Diagnosis present

## 2011-12-06 DIAGNOSIS — F411 Generalized anxiety disorder: Secondary | ICD-10-CM

## 2011-12-06 DIAGNOSIS — F1021 Alcohol dependence, in remission: Secondary | ICD-10-CM

## 2011-12-06 LAB — LITHIUM LEVEL: Lithium Lvl: 0.69 mEq/L — ABNORMAL LOW (ref 0.80–1.40)

## 2011-12-06 MED ORDER — LAMOTRIGINE 150 MG PO TABS
150.0000 mg | ORAL_TABLET | ORAL | Status: DC
  Filled 2011-12-06: qty 1

## 2011-12-06 MED ORDER — TRAZODONE HCL 100 MG PO TABS
100.0000 mg | ORAL_TABLET | ORAL | Status: DC
  Filled 2011-12-06: qty 1

## 2011-12-06 MED ORDER — CLONAZEPAM 0.5 MG PO TABS
0.5000 mg | ORAL_TABLET | ORAL | Status: DC
  Administered 2011-12-06 – 2011-12-10 (×12): 0.5 mg via ORAL
  Filled 2011-12-06 (×7): qty 1
  Filled 2011-12-06: qty 21
  Filled 2011-12-06 (×5): qty 1

## 2011-12-06 MED ORDER — LAMOTRIGINE 100 MG PO TABS
150.0000 mg | ORAL_TABLET | ORAL | Status: DC
  Administered 2011-12-06 – 2011-12-10 (×8): 150 mg via ORAL
  Filled 2011-12-06: qty 1
  Filled 2011-12-06: qty 21
  Filled 2011-12-06 (×8): qty 1
  Filled 2011-12-06: qty 21
  Filled 2011-12-06: qty 1

## 2011-12-06 MED ORDER — TRAZODONE HCL 100 MG PO TABS
100.0000 mg | ORAL_TABLET | ORAL | Status: DC
  Administered 2011-12-06 – 2011-12-09 (×5): 100 mg via ORAL
  Filled 2011-12-06 (×12): qty 1

## 2011-12-06 MED ORDER — ATENOLOL 50 MG PO TABS
50.0000 mg | ORAL_TABLET | ORAL | Status: DC
  Administered 2011-12-06 – 2011-12-10 (×7): 50 mg via ORAL
  Filled 2011-12-06 (×6): qty 1
  Filled 2011-12-06 (×2): qty 14
  Filled 2011-12-06 (×6): qty 1

## 2011-12-06 MED ORDER — ATENOLOL 50 MG PO TABS
50.0000 mg | ORAL_TABLET | ORAL | Status: DC
  Filled 2011-12-06: qty 1

## 2011-12-06 MED ORDER — GABAPENTIN 300 MG PO CAPS
300.0000 mg | ORAL_CAPSULE | ORAL | Status: DC
  Administered 2011-12-06 – 2011-12-10 (×12): 300 mg via ORAL
  Filled 2011-12-06 (×5): qty 1
  Filled 2011-12-06 (×2): qty 21
  Filled 2011-12-06 (×12): qty 1
  Filled 2011-12-06: qty 21

## 2011-12-06 MED ORDER — SERTRALINE HCL 50 MG PO TABS
50.0000 mg | ORAL_TABLET | Freq: Every day | ORAL | Status: DC
  Administered 2011-12-06 – 2011-12-10 (×5): 50 mg via ORAL
  Filled 2011-12-06 (×4): qty 1
  Filled 2011-12-06: qty 7
  Filled 2011-12-06 (×4): qty 1

## 2011-12-06 MED ORDER — LITHIUM CARBONATE 300 MG PO CAPS
600.0000 mg | ORAL_CAPSULE | Freq: Two times a day (BID) | ORAL | Status: DC
  Administered 2011-12-06 – 2011-12-10 (×8): 600 mg via ORAL
  Filled 2011-12-06 (×4): qty 2
  Filled 2011-12-06: qty 28
  Filled 2011-12-06 (×7): qty 2
  Filled 2011-12-06: qty 28
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
## 2011-12-06 NOTE — Discharge Planning (Signed)
[name] attended tx team, good participation.  States he is a patient of Dr Ladona Ridgel at [facility] who has been working with him on his meds to address depressive symptoms.  Two medications made him extremely lethargic, and another caused altered mental status and unsteadiness of feet.  Is open to re-starting lithium and a trial of Zoloft and Klonopin.  Will return home at d/c where he lives with his wife and dog, and follow up outpt.

## 2011-12-06 NOTE — B[facility] Suicide Risk Assessment (Signed)
Suicide Risk Assessment  Admission Assessment     Demographic factors:  Assessment Details Time of Assessment: Admission Information Obtained From: Patient Current Mental Status:  Current Mental Status:  (denies) Loss Factors:  Loss Factors: Decline in physical health Historical Factors:  Historical Factors: Family history of mental illness or substance abuse;Impulsivity Risk Reduction Factors:  Risk Reduction Factors: Sense of responsibility to family;Living with another person, especially a relative  CLINICAL FACTORS:   Severe Anxiety and/or Agitation Bipolar Disorder:   Depressive phase More than one psychiatric diagnosis Previous Psychiatric Diagnoses and Treatments Medical Diagnoses and Treatments/Surgeries  COGNITIVE FEATURES THAT CONTRIBUTE TO RISK:  None Noted.  Diagnosis:  Axis I: Bipolar I Disorder - Depressed. Generalized Anxiety Disorder. Alcohol Dependence - In Remission x 10 years.   The patient was seen today and reports the following:   ADL's: Intact.  Sleep: The patient reports to having significant difficulty initiating and maintaining sleep.  Appetite: The patient reports an ongoing good appetite today.   Mild>(1-10) >Severe  Hopelessness (1-10): 0  Depression (1-10): 5-6  Anxiety (1-10): 10 (Patient states 10 +)   Suicidal Ideation: The patient denies any suicidal ideations today.  Plan: No  Intent: No  Means: No   Homicidal Ideation: The patient denies any homicidal ideations today.  Plan: No  Intent: No.  Means: No   General Appearance/Behavior: The patient was friendly and cooperative today with this provider.  Eye Contact: Good.  Speech: Appropriate in rate and volume with no pressuring of speech noted today.  Motor Behavior: wnl.  Level of Consciousness: Alert and Oriented x 3.  Mental Status: Alert and Oriented x 3.  Mood: Moderately Depressed.  Affect: Moderately Constricted. Anxiety Level: Severe anxiety reported today.  Thought  Process: wnl.  Thought Content: The patient denies any current auditory or visual hallucinations or delusional thinking.  Perception:. wnl.  Judgment: Fair to Good.  Insight: Fair to Good.  Cognition: Oriented to person, place and time.   Review of Systems:  Neurological: No headaches, seizures or dizziness reported.  G.I.: The patient denies any constipation or stomach upset today.  Musculoskeletal: The patient reports significant joint pain and has a history of degenerative joint disease.   Time was spent today discussing with the patient his current symptoms. The patient reports to having significant difficulty initiating and maintaining sleep but reports a good appetite.  He reports moderate feelings of sadness, anhedonia and depressed.  He also denies any suicidal or homicidal ideations.  The patient reports significant anxiety symptoms today but denies any auditory or visual hallucinations or delusional thinking.  Time was also spent today discussing with the patient the situation leading to his admission. The patient states that he was having significant difficulty with depression and his outpatient psychiatrist made a few changes to address this which results in a decline in his symptoms.    Treatment Plan Summary:  1. Daily contact with patient to assess and evaluate symptoms and progress in treatment.  2. Medication management  3. The patient will deny suicidal ideations or homicidal ideations for 48 hours prior to discharge and have a depression and anxiety rating of 3 or less. The patient will also deny any auditory or visual hallucinations or delusional thinking.  4. The patient will deny any symptoms of substance withdrawal at time of discharge.   Plan:  1. Will continue the patient on his non-psychiatric medications.  2. Will start the medication Zoloft at 50 mgs po q am for depression and  anxiety. 3. Will start the medication Klonopin at 0.5 mgs po q am, 2 pm and hs for  anxiety. 4. Will restart the medication Lithium Carbonate at 600 mgs po BID-WC. 5. Will restart the medication Lamictal 150 mgs po q am and hs for mood stabilization. 6. Will restart the medication Trazodone at 100 mgs po q am and hs for anxiety and sleep. 7. Will restart the medication Neurontin at 300 mgs po q am, 2 pm and hs for anxiety, pain and mood stabilization. 8. Laboratory Studies reviewed. 9. Will order a TSH, Free T3, Free T4 and a repeat serum Lithium Level. 10. Will continue to monitor.   SUICIDE RISK:   Minimal: No identifiable suicidal ideation.  Patients presenting with no risk factors but with morbid ruminations; may be classified as minimal risk based on the severity of the depressive symptoms  [name] 12/06/2011, 5:59 PM

## 2011-12-06 NOTE — Progress Notes (Signed)
Pt has been up and has been visible in milieu this evening, pt did talk about having difficulty with sleep, pt did endorse some feelings of depression but no suicidal ideation, pt did receive all medications this evening without incident, pt spoke about being on lithium in past and how it has worked well for him in the past, support provided, will continue to monitor

## 2011-12-06 NOTE — Progress Notes (Signed)
Pt had so complaints about his anxiety. Pt was given medication and reminded to use his learned coping skills. Pt still seems a bit shaky and unsteady. Pt attends group and actively participates. Pt was offered support and encouragement. Pt is receptive to treatment and safety maintained on the unit.

## 2011-12-06 NOTE — Treatment Plan (Signed)
Interdisciplinary Treatment Plan Update (Adult)  Date: 12/06/2011  Time Reviewed: 8:18 AM   Progress in Treatment: Attending groups: Yes Participating in groups: Yes Taking medication as prescribed: Yes Tolerating medication: Yes   Family/Significant other contact made: Counselor to contact wife  Patient understands diagnosis:  Yes As evidenced by asking for help with mood stabilization, particularly anxiety Discussing patient identified problems/goals with staff:  Yes See below Medical problems stabilized or resolved:  Yes Denies suicidal/homicidal ideation: Yes In tx team Issues/concerns per patient self-inventory:  Yes  Anxiety 10, Depression 5 Other:  New problem(s) identified: N/A  Reason for Continuation of Hospitalization: Depression Medical Issues Medication stabilization  Interventions implemented related to continuation of hospitalization: Medication trial of klonopin, zoloft, restart lithium  Monitor symptoms and for side effects  Encourage group participation   Additional comments:  Estimated length of stay: 3-4 days  Discharge Plan: Return home, follow up [facility]  New goal(s): N/A  Review of initial/current patient goals per problem list:   1.  Goal(s): Decrease depression and anxiety  Met:  No  Target date:5/6  As evidenced [id number] will rate his depression and anxiety at a 3 or less  2.  Goal (s): Identify a comprehensive mental wellness plan  Met:  No  Target date:5/6  As evidenced NW:GNFA report  3.  Goal(s):  Met:  No  Target date:  As evidenced by:  4.  Goal(s):  Met:  No  Target date:  As evidenced by:  Attendees: Patient:  [name] 12/06/2011 8:18 AM  Family:     Physician:  Harvie Heck Readling 12/06/2011 8:18 AM   Nursing: Shelda Jakes 12/06/2011 8:18 AM   Case Manager:  Richelle Ito, LCSW 12/06/2011 8:18 AM   Counselor: [name] 12/06/2011 8:18 AM   Other: Verne Spurr PA 12/06/2011 8:18 AM  Other:     Other:     Other:        Scribe for Treatment Team:   Ida Rogue, 12/06/2011 8:18 AM

## 2011-12-06 NOTE — H&P (Signed)
Psychiatric Admission Assessment Adult  Patient Identification:  [name] Date of Evaluation:  12/06/2011 Chief Complaint:  Bipolar disorder  History of Present Illness: [name] is a 51 yr. Old MWM who presents to [facility] after a brief stay on the in patient unit.  His medication had been changed recently by his psychiatrist and he was extremely sleepy all the time.  He was then started on Welbutrin and developed significant difficulty walking and maintaining balance.  He developed poor sleep and became more unsteady on his feet.  He was admitted for medical evaluation and then transferred to [facility].  Initially his Lithium level was elevated so his lithium was discontinued.   He states he is not suicidal or homicidal but "just want my medications adjusted so I can return home. I've been healthy on Lithium for years and it works, I just want to be back on it.  Past Psychiatric History:  Diagnosis:  Bipolar disorder  Hospitalizations: 1972 for detox at [facility].  Outpatient Care:  He sees Dr. Ladona Ridgel as a psychiatrist  Substance Abuse Care:  none  Self-Mutilation:  Suicidal Attempts:  Violent Behaviors:   Past Medical History:   Past Medical History  Diagnosis Date  . Tachycardia   . Peripheral neuropathy   . Alcohol abuse     hx of quit 4/04  . Bilateral external ear infections 2007  . Tobacco abuse   . Avascular necrosis     SP right and left hip replacements and L shoulder arthroplasty, secondary to steroid use.  Has been on embrel in past.  . Psoriasis     Previously on embrel, stopped because of price.   . Hypertension   . Hepatitis C   . ARF (acute renal failure) 2008    Multifactorial in setting of over use of ibuprofen and HCTZ./ACEI    Allergies:   Allergies  Allergen Reactions  . Erythromycin     REACTION: GI upset  . Fentanyl Citrate     REACTION: itching  . Hydromorphone Hcl     REACTION: itching  . Prochlorperazine Maleate     REACTION: jaw lock   PTA  Medications: Prescriptions prior to admission  Medication Sig Dispense Refill  . amLODipine (NORVASC) 5 MG tablet Take 1 tablet (5 mg total) by mouth daily.  30 tablet  0  . atenolol (TENORMIN) 25 MG tablet Take 50 mg by mouth 2 (two) times daily.      . clobetasol (TEMOVATE) 0.05 % external solution Apply 1 application topically 2 (two) times daily. Applied to sensitive areas for psoriasis.      . cloNIDine (CATAPRES) 0.3 MG tablet Take 1 tablet (0.3 mg total) by mouth 2 (two) times daily.  60 tablet  5  . etanercept (ENBREL) 50 MG/ML injection Inject 25 mg into the skin 2 (two) times a week. Given on Tuesdays and Saturdays.      . nicotine (NICODERM CQ - DOSED IN MG/24 HOURS) 14 mg/24hr patch Place 1 patch onto the skin daily.  28 patch  0  . traZODone (DESYREL) 100 MG tablet Take 100 mg by mouth 2 (two) times daily.       Marland Kitchen DISCONTD: lamoTRIgine (LAMICTAL) 200 MG tablet Take 200-300 mg by mouth 2 (two) times daily. Take 1.5 tab in am, 1 tab in pm      . gabapentin (NEURONTIN) 300 MG capsule Take 300 mg by mouth 3 (three) times daily.      Marland Kitchen DISCONTD: buPROPion [facility] SR)  100 MG 12 hr tablet Take 1 tablet (100 mg total) by mouth daily. For 7 days and then stop  7 tablet  0  . DISCONTD: lithium 600 MG capsule Take 600-1,200 mg by mouth 2 (two) times daily with a meal. Take 2 tab in am, 1 in pm      . DISCONTD: sildenafil (VIAGRA) 50 MG tablet Take 50 mg by mouth daily as needed. Take 1 tab by mouth 1 hour prior to sexual activity.        Previous Psychotropic Medications: Lamictal, Lithium, Trazodone, prozac, welbutrin.   Substance Abuse History in the last 12 months:  None in the last 20+ years.   Consequences of Substance Abuse: None   Social History:  Current Place of Residence:  [place] Place of Birth:  [place] Family Members: Marital Status:  Married Children:  Sons:  Daughters: Relationships: Education:  [employer] Problems/Performance: Religious  Beliefs/Practices: History of Abuse (Emotional/Phsycial/Sexual) Teacher, music History:  None. Legal History: Hobbies/Interests:  Family History:   Family History  Problem Relation Age of Onset  . Hypertension Mother   . Bipolar disorder Father   . Obesity Father   . Obesity Brother    ROS: Negative with the exception of HPI sx. PE; Completed in ED and in hospital by MD.  Those results are reviewed and the patient is evaluated. I agree with those results.  Mental Status Examination/Evaluation: Objective:  Appearance: Disheveled  Eye Contact::  Good  Speech:  Clear and Coherent  Volume:  Normal  Mood:  Anxious  Affect:  Depressed  Thought Process:  Circumstantial, Goal Directed and Intact  Orientation:  Full  Thought Content:  WDL  Suicidal Thoughts:  No  Homicidal Thoughts:  No  Memory:  Immediate;   Fair  Judgement:  Impaired  Insight:  Lacking  Psychomotor Activity:  Increased  Concentration:  Fair  Recall:  Poor  Akathisia:  No  Handed:    AIMS (if indicated):     Assets:  Communication Skills Desire for Improvement Housing Physical Health Resilience Social Support  Sleep:  Number of Hours: 5     Laboratory/X-Ray Psychological Evaluation(s)  Results for SWADE, SHONKA (MRN [id number]) as of 12/06/2011 13:17  Ref. Range 12/03/2011 16:35  Sodium Latest Range: 135-145 mEq/L 137  Potassium Latest Range: 3.5-5.1 mEq/L 4.8  Chloride Latest Range: 96-112 mEq/L 104  CO2 Latest Range: 19-32 mEq/L 29  BUN Latest Range: 6-23 mg/dL 9  Creat Latest Range: 0.50-1.35 mg/dL 1.19  Calcium Latest Range: 8.4-10.5 mg/dL 9.7  GFR calc non Af Amer Latest Range: >90 mL/min >90  GFR calc Af Amer Latest Range: >90 mL/min >90  Glucose Latest Range: 70-99 mg/dL 74  Alkaline Phosphatase Latest Range: 39-117 U/L 77  Albumin Latest Range: 3.5-5.2 g/dL 3.4 (L)  AST Latest Range: 0-37 U/L 57 (H)  ALT Latest Range: 0-53 U/L 52  Total Protein Latest Range:  6.0-8.3 g/dL 6.2  Total Bilirubin Latest Range: 0.3-1.2 mg/dL 0.6  WBC Latest Range: 4.0-10.5 K/uL 2.4 (L)  RBC Latest Range: 4.22-5.81 MIL/uL 3.25 (L)  Hemoglobin Latest Range: 13.0-17.0 g/dL 14.7 (L)  HCT Latest Range: 39.0-52.0 % 34.7 (L)  MCV Latest Range: 78.0-100.0 fL 106.8 (H)  MCH Latest Range: 26.0-34.0 pg 34.8 (H)  MCHC Latest Range: 30.0-36.0 g/dL 82.9  RDW Latest Range: 11.5-15.5 % 14.8  Platelets Latest Range: 150-400 K/uL 68 (L)  Neutrophils Relative Latest Range: 43-77 % 57  Lymphocytes Relative Latest Range: 12-46 % 29  Monocytes Relative Latest Range: 3-12 % 12  Eosinophils Relative Latest Range: 0-5 % 2  Basophils Relative Latest Range: 0-1 % 0  NEUT# Latest Range: 1.7-7.7 K/uL 1.4 (L)  Lymphocytes Absolute Latest Range: 0.7-4.0 K/uL 0.7  Monocytes Absolute Latest Range: 0.1-1.0 K/uL 0.3  Eosinophils Absolute Latest Range: 0.0-0.7 K/uL 0.0  Basophils Absolute Latest Range: 0.0-0.1 K/uL 0.0  Smear Review No range found LARGE PLATELETS PRESENT      Assessment:    AXIS I:  Bipolar I disorder AXIS II:  Deferred AXIS III:   . Tachycardia   . Peripheral neuropathy   . Alcohol abuse     hx of quit 4/04  . Bilateral external ear infections 2007  . Tobacco abuse     Followed by Dr. [name]  . Avascular necrosis     SP right and left hip replacements and L shoulder arthroplasty, secondary to steroid use.  Has been on embrel in past.  . Psoriasis     Previously on embrel, stopped because of price.   . Hypertension   . Hepatitis C   . ARF (acute renal failure) 2008    Multifactorial in setting of over use of ibuprofen and HCTZ./ACEI  AXIS IV:  Problems with access to medical care AXIS V:  51-60 moderate symptoms  Current Medications:  Current Facility-Administered Medications  Medication Dose Route Frequency Provider Last Rate Last Dose  . acetaminophen (TYLENOL) tablet 650 mg  650 mg Oral Q6H PRN Mike Craze, MD      . alum & mag hydroxide-simeth  (MAALOX/MYLANTA) 200-200-20 MG/5ML suspension 30 mL  30 mL Oral Q4H PRN Mike Craze, MD      . amLODipine (NORVASC) tablet 5 mg  5 mg Oral Daily Mike Craze, MD   5 mg at 12/06/11 0850  . atenolol (TENORMIN) tablet 50 mg  50 mg Oral BID Mike Craze, MD   50 mg at 12/06/11 0850  . clobetasol cream (TEMOVATE) 0.05 %   Topical BID Curlene Labrum Readling, MD      . cloNIDine (CATAPRES) tablet 0.3 mg  0.3 mg Oral BID Mike Craze, MD   0.3 mg at 12/06/11 0850  . etanercept (ENBREL) 50 MG/ML injection 25 mg  25 mg Subcutaneous 2 times weekly Mike Craze, MD      . gabapentin (NEURONTIN) capsule 300 mg  300 mg Oral TID Mike Craze, MD   300 mg at 12/06/11 0850  . lamoTRIgine (LAMICTAL) tablet 150 mg  150 mg Oral BID Mike Craze, MD   150 mg at 12/06/11 0850  . magnesium hydroxide (MILK OF MAGNESIA) suspension 30 mL  30 mL Oral Daily PRN Mike Craze, MD      . nicotine (NICODERM CQ - dosed in mg/24 hours) patch 14 mg  14 mg Transdermal Daily Mike Craze, MD   14 mg at 12/06/11 0848  . traZODone (DESYREL) tablet 100 mg  100 mg Oral BID Mike Craze, MD   100 mg at 12/06/11 0852  . DISCONTD: clobetasol (TEMOVATE) 0.05 % external solution 1 application  1 application Topical BID Mike Craze, MD      . DISCONTD: traZODone (DESYREL) tablet 100 mg  100 mg Oral QHS Mike Craze, MD       Facility-Administered Medications Ordered in Other Encounters  Medication Dose Route Frequency Provider Last Rate Last Dose  . DISCONTD: acetaminophen (TYLENOL) tablet 650 mg  650 mg Oral Q4H PRN Molly Maduro  Jeraldine Loots, MD      . DISCONTD: amLODipine (NORVASC) tablet 5 mg  5 mg Oral Daily Pascal Lux Wingen, PA-C   5 mg at 12/05/11 1014  . DISCONTD: atenolol (TENORMIN) tablet 50 mg  50 mg Oral BID Pascal Lux Wingen, PA-C   50 mg at 12/05/11 1011  . DISCONTD: buPROPion [facility] SR) 12 hr tablet 100 mg  100 mg Oral Daily Pascal Lux Wingen, PA-C   100 mg at 12/05/11 1008  . DISCONTD: cloNIDine (CATAPRES)  tablet 0.3 mg  0.3 mg Oral BID Pascal Lux Wingen, PA-C   0.3 mg at 12/05/11 1015  . DISCONTD: lamoTRIgine (LAMICTAL) tablet 150 mg  150 mg Oral BID Nehemiah Settle, MD   150 mg at 12/05/11 1011  . DISCONTD: lithium carbonate capsule 300 mg  300 mg Oral QHS Gerhard Munch, MD      . DISCONTD: lithium carbonate capsule 600 mg  600 mg Oral q morning - 10a Gerhard Munch, MD   600 mg at 12/05/11 1008  . DISCONTD: LORazepam (ATIVAN) tablet 1 mg  1 mg Oral Q8H PRN Pascal Lux Wingen, PA-C   1 mg at 12/03/11 2318  . DISCONTD: mirtazapine (REMERON SOL-TAB) disintegrating tablet 15 mg  15 mg Oral QHS Nehemiah Settle, MD   15 mg at 12/05/11 0011  . DISCONTD: nicotine (NICODERM CQ - dosed in mg/24 hours) patch 14 mg  14 mg Transdermal Daily Pascal Lux Wingen, PA-C   14 mg at 12/05/11 1017  . DISCONTD: ondansetron (ZOFRAN) tablet 4 mg  4 mg Oral Q8H PRN Gerhard Munch, MD      . DISCONTD: ziprasidone (GEODON) injection 20 mg  20 mg Intramuscular Q6H PRN Gerhard Munch, MD        Observation Level/Precautions:  routine  Laboratory:    Psychotherapy:    Medications:    Routine PRN Medications:  Yes  Consultations:    Discharge Concerns:    Other:     Treatment Plan Summary: Admit patient for stabilization and crisis management. Daily contact with patient to assess and evaluate symptoms and progress in treatment Medication management, group and unit programming for increased stability and improved psychiatric functioning. Treatment Plan: 1. Lithium will be restarted and monitored. As will Lamictal. 2. Medical problems will be evaluated and treated appropriately. 3. Home medications will be ordered as indicated.  Rona Ravens. Alanni Vader PAC For Dr. Harvie Heck D. Readling 5/3/201311:32 AM

## 2011-12-06 NOTE — Progress Notes (Signed)
Sleeping at long intervals.  No physical or behavioral problems reported or observed.  Safety maintained via Q 15 minute checks.

## 2011-12-06 NOTE — Progress Notes (Addendum)
Pt is asleep at this time. Pt appears to be in no signs of distress at this time. Pt respirations are even and unlabored. Writer will continue to monitor pt. Pt remains safe at this time with q56min checks.

## 2011-12-07 DIAGNOSIS — F3130 Bipolar disorder, current episode depressed, mild or moderate severity, unspecified: Secondary | ICD-10-CM

## 2011-12-07 LAB — T4, FREE: Free T4: 1.18 ng/dL (ref 0.80–1.80)

## 2011-12-07 LAB — T3, FREE: T3, Free: 2.8 pg/mL (ref 2.3–4.2)

## 2011-12-07 MED ORDER — ETANERCEPT 50 MG/ML ~~LOC~~ SOLN
25.0000 mg | SUBCUTANEOUS | Status: DC
  Administered 2011-12-07: 25 mg via SUBCUTANEOUS

## 2011-12-07 MED ORDER — ETANERCEPT 25 MG ~~LOC~~ KIT
25.0000 mg | PACK | SUBCUTANEOUS | Status: DC
  Administered 2011-12-10: 25 mg via SUBCUTANEOUS

## 2011-12-07 NOTE — Progress Notes (Signed)
Pt has been up and active this morning, did receive all morning medication without incident, pt has reported some feelings of depression and hopelessness as well as feelings of agitation, pt reported that he slept ok, pt has denied any suicidal feelings today, support and encouragement provided, will continue to monitor

## 2011-12-07 NOTE — Progress Notes (Signed)
Pt up and visible in milieu. Still reports somnolence. On 1:1 he rates his depression a "2" and denies any SI stating, "I'm doing well with my depression and thoughts." He does display insight stating, "I get agitated really easily. That's what I need to work on." Wife brought in Enbrel which pt takes every Sat and Tues. Self-admin without difficulty. Pt remains tremulous. Atenolol held due to low blood pressure and pulse upon sitting and standing. He did complain of unsteadiness upon standing. Reviewed fall precautions and medications. Given support, education. No prn meds needed. Pt remains safe denying SI/HI/AVH. Lawrence Marseilles

## 2011-12-07 NOTE — Progress Notes (Signed)
[facility] Group Notes:  (Counselor/Nursing/MHT/Case Management/Adjunct)  12/07/2011 4:55 PM  Type of Therapy:  After care Planning group    Summary of Progress/Problems: Pt. participated in after care planning group. Pt. Was give the [facility] SI  Prevention information and the crisis and hot line numbers. Pt. Agreed to use them if needed.  Pt. Stated he was okay but fell asleep and did not wake back up for the rest of the group. Neila Gear 12/07/2011, 4:55 PM

## 2011-12-07 NOTE — Progress Notes (Signed)
[facility] Group Notes:  (Counselor/Nursing/MHT/Case Management/Adjunct)  12/07/2011 4:25 PM  Type of Therapy:  Group Therapy  Participation Level:  None  Participation Quality:  Drowsy  Affect:  Blunted  Cognitive:  Appropriate  Insight:  None  Engagement in Group:  None  Engagement in Therapy:  None  Modes of Intervention:  Clarification, Limit-setting, Socialization and Support  Summary of Progress/Problems: Pt. participated in group discussion on self-sabotaging behaviors and how they can support themselves. Pt. Was asleep during group and was unable to wake up when called upon by the therapist.   Neila Gear 12/07/2011, 4:25 PM

## 2011-12-07 NOTE — Progress Notes (Addendum)
[facility] MD Progress Note  12/07/2011 10:46 AM  Diagnosis:  Axis I: Bipolar, Depressed  ADL's:  Intact  Sleep: Good  Appetite:  Good  Suicidal Ideation:  Plan:  No Intent:  No Means:  No Homicidal Ideation:  Plan:  No Intent:  No Means:  No  [name] reports feeling agitated last night, and anxious this morning. He denies SI/HI, AVH. Reports good sleep last night. He does complain of back pain today. No signs/sx of lithium toxicity.  Mental Status Examination/Evaluation: Objective:  Appearance: Casual and Fairly Groomed  Eye Contact::  Good  Speech:  Clear and Coherent and Normal Rate  Volume:  Normal  Mood:  Anxious  Affect:  Constricted  Thought Process:  Coherent and Goal Directed  Orientation:  Full  Thought Content:  No SI/HI, no delusions, no AVH, not responding to internal stimuli  Suicidal Thoughts:  No  Homicidal Thoughts:  No  Judgement:  Fair  Insight:  Fair  Psychomotor Activity:  Normal  Concentration:  Fair  Sleep:  Number of Hours: 5.5    Vital Signs:Blood pressure 97/62, pulse 61, temperature 97.7 F (36.5 C), temperature source Oral, resp. rate 18, height 5\' 10"  (1.778 m), weight 139 lb (63.05 kg). Current Medications: Current Facility-Administered Medications  Medication Dose Route Frequency Provider Last Rate Last Dose  . acetaminophen (TYLENOL) tablet 650 mg  650 mg Oral Q6H PRN Mike Craze, MD      . alum & mag hydroxide-simeth (MAALOX/MYLANTA) 200-200-20 MG/5ML suspension 30 mL  30 mL Oral Q4H PRN Mike Craze, MD      . amLODipine (NORVASC) tablet 5 mg  5 mg Oral Daily Curlene Labrum Readling, MD   5 mg at 12/07/11 0825  . atenolol (TENORMIN) tablet 50 mg  50 mg Oral BH-qamhs Curlene Labrum Readling, MD   50 mg at 12/07/11 0826  . clobetasol cream (TEMOVATE) 0.05 %   Topical BID Curlene Labrum Readling, MD      . clonazePAM (KLONOPIN) tablet 0.5 mg  0.5 mg Oral [id number] Curlene Labrum Readling, MD   0.5 mg at 12/07/11 3244  . cloNIDine (CATAPRES) tablet 0.3 mg  0.3  mg Oral BID Curlene Labrum Readling, MD   0.3 mg at 12/07/11 0825  . etanercept (ENBREL) 50 MG/ML injection 25 mg  25 mg Subcutaneous 2 times weekly Curlene Labrum Readling, MD      . gabapentin (NEURONTIN) capsule 300 mg  300 mg Oral [id number] Curlene Labrum Readling, MD   300 mg at 12/07/11 0826  . lamoTRIgine (LAMICTAL) tablet 150 mg  150 mg Oral BH-qamhs Curlene Labrum Readling, MD   150 mg at 12/07/11 0826  . lithium carbonate capsule 600 mg  600 mg Oral BID WC Curlene Labrum Readling, MD   600 mg at 12/07/11 0826  . magnesium hydroxide (MILK OF MAGNESIA) suspension 30 mL  30 mL Oral Daily PRN Mike Craze, MD      . nicotine (NICODERM CQ - dosed in mg/24 hours) patch 14 mg  14 mg Transdermal Daily Curlene Labrum Readling, MD   14 mg at 12/07/11 0657  . sertraline (ZOLOFT) tablet 50 mg  50 mg Oral Daily Curlene Labrum Readling, MD   50 mg at 12/07/11 0844  . traZODone (DESYREL) tablet 100 mg  100 mg Oral BH-qamhs Curlene Labrum Readling, MD   100 mg at 12/06/11 2101  . DISCONTD: atenolol (TENORMIN) tablet 50 mg  50 mg Oral BID Mike Craze, MD   50 mg  at 12/06/11 0850  . DISCONTD: atenolol (TENORMIN) tablet 50 mg  50 mg Oral BH-qamhs Randy D Readling, MD      . DISCONTD: gabapentin (NEURONTIN) capsule 300 mg  300 mg Oral TID Mike Craze, MD   300 mg at 12/06/11 1147  . DISCONTD: lamoTRIgine (LAMICTAL) tablet 150 mg  150 mg Oral BID Mike Craze, MD   150 mg at 12/06/11 0850  . DISCONTD: lamoTRIgine (LAMICTAL) tablet 150 mg  150 mg Oral BH-qamhs Randy D Readling, MD      . DISCONTD: traZODone (DESYREL) tablet 100 mg  100 mg Oral BID Mike Craze, MD   100 mg at 12/06/11 0852  . DISCONTD: traZODone (DESYREL) tablet 100 mg  100 mg Oral BH-qamhs Ronny Bacon, MD        Lab Results:  Results for orders placed during the hospital encounter of 12/05/11 (from the past 48 hour(s))  LITHIUM LEVEL     Status: Abnormal   Collection Time   12/06/11  6:10 AM      Component Value Range Comment   Lithium Lvl 0.69 (*) 0.80 - 1.40 (mEq/L)   TSH      Status: Normal   Collection Time   12/06/11  7:32 PM      Component Value Range Comment   TSH 1.388  0.350 - 4.500 (uIU/mL)   T3, FREE     Status: Normal   Collection Time   12/06/11  7:32 PM      Component Value Range Comment   T3, Free 2.8  2.3 - 4.2 (pg/mL)   T4, FREE     Status: Normal   Collection Time   12/06/11  7:32 PM      Component Value Range Comment   Free T4 1.18  0.80 - 1.80 (ng/dL)     Treatment Plan Summary: Daily contact with patient to assess and evaluate symptoms and progress in treatment Medication management  Plan: 1. Will continue lithium 600 mg po bid for mood stabilization. Follow up lithium level on 5/5. 2. Add Klonopin 0.5mg  po qday prn anxiety, to treat breakthrough anxiety. 3. Continue other medications as ordered. 4. Encouraged participation in [facility] groups.  [name] 12/07/2011, 10:46 AM  Addendum: Will leave Klonopin as ordered, as the patient already receives a midday dose at 2pm.

## 2011-12-07 NOTE — ED Notes (Signed)
Opened chart to find where belongings were placed. Unable to locate.

## 2011-12-08 LAB — LITHIUM LEVEL: Lithium Lvl: 1.08 mEq/L (ref 0.80–1.40)

## 2011-12-08 NOTE — Progress Notes (Signed)
Patient ID: [name], male   DOB: 19-Jan-1961, 51 y.o.   MRN: [id number] [facility] MD Progress Note  12/08/2011 10:40 AM  Diagnosis:  Axis I: Bipolar, Depressed  ADL's:  Intact  Sleep: Good  Appetite:  Good  Suicidal Ideation:  Plan:  No Intent:  No Means:  No Homicidal Ideation:  Plan:  No Intent:  No Means:  No  [name] reports feeling "agitated" again this morning but does not appear agitated; he is seated in the dayroom calmly watching television. Mood is "good" and he denies SI/HI. Requests Advil for back pain and was advised this is not recommended as it can interact with lithium. He then stated he was content with Tylenol for now. No signs/sx of lithium toxicity.  Mental Status Examination/Evaluation: Objective:  Appearance: Casual and Fairly Groomed  Eye Contact::  Good  Speech:  Clear and Coherent and Normal Rate  Volume:  Normal  Mood: "good"  Affect:  Constricted  Thought Process:  Coherent and Goal Directed  Orientation:  Full  Thought Content:  No SI/HI, no delusions, no AVH, not responding to internal stimuli  Suicidal Thoughts:  No  Homicidal Thoughts:  No  Judgement:  Fair  Insight:  Fair  Psychomotor Activity:  Normal  Concentration:  Fair  Sleep:  Number of Hours: 6.5    Vital Signs:Blood pressure 103/71, pulse 66, temperature 97.7 F (36.5 C), temperature source Oral, resp. rate 16, height 5\' 10"  (1.778 m), weight 139 lb (63.05 kg). Current Medications: Current Facility-Administered Medications  Medication Dose Route Frequency Provider Last Rate Last Dose  . acetaminophen (TYLENOL) tablet 650 mg  650 mg Oral Q6H PRN Mike Craze, MD   650 mg at 12/07/11 1655  . alum & mag hydroxide-simeth (MAALOX/MYLANTA) 200-200-20 MG/5ML suspension 30 mL  30 mL Oral Q4H PRN Mike Craze, MD      . amLODipine (NORVASC) tablet 5 mg  5 mg Oral Daily Curlene Labrum Readling, MD   5 mg at 12/08/11 0801  . atenolol (TENORMIN) tablet 50 mg  50 mg Oral BH-qamhs Curlene Labrum  Readling, MD   50 mg at 12/08/11 0801  . clobetasol cream (TEMOVATE) 0.05 %   Topical BID Curlene Labrum Readling, MD      . clonazePAM Scarlette Calico) tablet 0.5 mg  0.5 mg Oral [id number] Curlene Labrum Readling, MD   0.5 mg at 12/08/11 0801  . cloNIDine (CATAPRES) tablet 0.3 mg  0.3 mg Oral BID Curlene Labrum Readling, MD   0.3 mg at 12/08/11 0801  . etanercept (ENBREL) 25 MG injection KIT 25 mg  25 mg Subcutaneous 2 times weekly Curlene Labrum Readling, MD      . gabapentin (NEURONTIN) capsule 300 mg  300 mg Oral [id number] Curlene Labrum Readling, MD   300 mg at 12/08/11 0801  . lamoTRIgine (LAMICTAL) tablet 150 mg  150 mg Oral BH-qamhs Curlene Labrum Readling, MD   150 mg at 12/08/11 0801  . lithium carbonate capsule 600 mg  600 mg Oral BID WC Curlene Labrum Readling, MD   600 mg at 12/08/11 0801  . magnesium hydroxide (MILK OF MAGNESIA) suspension 30 mL  30 mL Oral Daily PRN Mike Craze, MD      . nicotine (NICODERM CQ - dosed in mg/24 hours) patch 14 mg  14 mg Transdermal Daily Curlene Labrum Readling, MD   14 mg at 12/08/11 4098  . sertraline (ZOLOFT) tablet 50 mg  50 mg Oral Daily Ronny Bacon, MD  50 mg at 12/08/11 0801  . traZODone (DESYREL) tablet 100 mg  100 mg Oral BH-qamhs Curlene Labrum Readling, MD   100 mg at 12/08/11 0801  . DISCONTD: etanercept (ENBREL) 50 MG/ML injection 25 mg  25 mg Subcutaneous 2 times weekly Ronny Bacon, MD      . DISCONTD: etanercept (ENBREL) 50 MG/ML injection 25 mg  25 mg Subcutaneous 2 times weekly Ronny Bacon, MD   25 mg at 12/07/11 2011    Lab Results:  Results for orders placed during the hospital encounter of 12/05/11 (from the past 48 hour(s))  TSH     Status: Normal   Collection Time   12/06/11  7:32 PM      Component Value Range Comment   TSH 1.388  0.350 - 4.500 (uIU/mL)   T3, FREE     Status: Normal   Collection Time   12/06/11  7:32 PM      Component Value Range Comment   T3, Free 2.8  2.3 - 4.2 (pg/mL)   T4, FREE     Status: Normal   Collection Time   12/06/11  7:32 PM      Component  Value Range Comment   Free T4 1.18  0.80 - 1.80 (ng/dL)     Treatment Plan Summary: Daily contact with patient to assess and evaluate symptoms and progress in treatment Medication management  Plan: 1. Will continue lithium 600 mg po bid for mood stabilization. Follow up lithium level tonight. 2. Continue other medications as ordered. 3. Encouraged participation in [facility] groups. 4. Continue q15 minute observation for safety.  Eligah East 12/08/2011, 10:40 AM

## 2011-12-08 NOTE — Progress Notes (Signed)
Slept fair last nite, appetite is improving going to Dr for meals, energy level is hyper and ability to pay attention is improving, depressed 5/10 and hopeless 1/10, denies Si or Hi, is unsteady on his feet from time to time, encouraged patient to be careful and follow fall precautions, taking meds as ordered by MD, attending group. q79min safety checks continue and support offered Safety maintained

## 2011-12-08 NOTE — Progress Notes (Signed)
[facility] Group Notes:  (Counselor/Nursing/MHT/Case Management/Adjunct)  12/08/2011 7:18 PM  Type of Therapy:  After Care Planning Group  Pt. participated in after care planning group and was given SI information as well as crisis and hot line numbers. Pt. Agreed to use them if needed. Pt. Was given support group information as well as information on the [facility]. Pt.'s in the group were encouraged to attend support groups outside the hospital. Pt. Spoke about having on SI or HI. Pt. Stated he talked to doctor about getting medication for pain. In terms of his energy level the pt. Reports he felt like a 5. Pt. Reports no problems with sleeping.  Neila Gear 12/08/2011, 7:18 PM

## 2011-12-08 NOTE — Progress Notes (Signed)
[facility] Group Notes:  (Counselor/Nursing/MHT/Case Management/Adjunct)  12/08/2011 7:32 PM  Type of Therapy:  Group Therapy  Participation Level:  Active  Participation Quality:  Appropriate  Affect:  Appropriate  Cognitive:  Appropriate  Insight:  Good  Engagement in Group:  Good  Engagement in Therapy:  Good  Modes of Intervention:  Clarification, Limit-setting, Socialization and Support  Summary of Progress/Problems: Pt. participated in group on supports and how they can support themselves. Each pt. Shared how the person who is a support to them supports them personally.  Pt. Spoke about wife and his brother being a support. Pt. States his brother has some of the same mental health issues and feels he understand him.  Neila Gear 12/08/2011, 7:32 PM

## 2011-12-09 MED ORDER — IBUPROFEN 800 MG PO TABS
800.0000 mg | ORAL_TABLET | Freq: Three times a day (TID) | ORAL | Status: DC | PRN
  Administered 2011-12-09: 800 mg via ORAL
  Filled 2011-12-09: qty 1

## 2011-12-09 MED ORDER — TRAZODONE HCL 50 MG PO TABS
50.0000 mg | ORAL_TABLET | Freq: Every day | ORAL | Status: DC
  Administered 2011-12-10: 50 mg via ORAL
  Filled 2011-12-09 (×2): qty 1

## 2011-12-09 MED ORDER — IBUPROFEN 800 MG PO TABS
ORAL_TABLET | ORAL | Status: AC
  Administered 2011-12-09: 800 mg via ORAL
  Filled 2011-12-09: qty 1

## 2011-12-09 MED ORDER — TRAZODONE HCL 100 MG PO TABS
100.0000 mg | ORAL_TABLET | Freq: Every day | ORAL | Status: DC
  Administered 2011-12-09: 100 mg via ORAL
  Filled 2011-12-09 (×2): qty 1

## 2011-12-09 NOTE — Progress Notes (Signed)
Slept well last nite, appetite is good, energy level is normal, ability to pay attention is improving, depressed 6/10 and hopeless 1/10, denies Si or HI, spoke w/wife this a.m. And wanted to know about her FMLA paperwork as well as the patient taking meds to make him sleepy all day, she has to work and can't have patient sleeping at home without anyone there, this is disturbing to her, gave her phone extention of the SW so she could get more information about this issue. q66mins safety checks continue and support offered Safety maintained

## 2011-12-09 NOTE — Progress Notes (Signed)
Pt observed in the dayroom interacting with peers and watching TV.  Attended evening group.  Pt still has visible tremors, but he reports they are getting better.  He requested more Ibuprofen for pain, but he had received a dose at 1700.  Explained that it was too earlier.  Pt voiced understanding.  He voiced no other needs at this time.  He denies SI/HI/AV at this time.  Safety maintained with q15 minute checks.

## 2011-12-09 NOTE — Progress Notes (Signed)
Pt has had a good evening. He is less tremulous and reports feeling "good" today. Again makes references to agitation but presentation is pleasant and calm. No prn's requested or given. Pt supported and medicated without difficulty per orders. He denies SI/HI/AVH and reports improvement in thinking and mood. Pt safe. Lawrence Marseilles

## 2011-12-09 NOTE — Progress Notes (Signed)
[facility] MD Progress Note  12/09/2011 3:49 PM  Diagnosis:  Axis I: Bipolar I Disorder - Depressed.  Generalized Anxiety Disorder.  Alcohol Dependence - In Remission x 10 years.   The patient was seen today and reports the following:   ADL's: Intact.  Sleep: The patient reports to sleeping much better last night without difficulty. Appetite: The patient reports an ongoing good appetite today.   Mild>(1-10) >Severe  Hopelessness (1-10): 0  Depression (1-10): 2  Anxiety (1-10): 2-3 Pain (1-10): 7  Suicidal Ideation: The patient adamantly denies any suicidal ideations today.  Plan: No  Intent: No  Means: No   Homicidal Ideation: The patient adamantly denies any homicidal ideations today.  Plan: No  Intent: No.  Means: No   General Appearance/Behavior: The patient remains friendly and cooperative today with this provider.  Eye Contact: Good.  Speech: Appropriate in rate and volume with no pressuring of speech noted today.  Motor Behavior: wnl.  Level of Consciousness: Alert and Oriented x 3.  Mental Status: Alert and Oriented x 3.  Mood: Mildly Depressed.  Affect: Mildly Constricted.  Anxiety Level: Mild anxiety reported today.  Thought Process: wnl.  Thought Content: The patient denies any current auditory or visual hallucinations or delusional thinking.  Perception:. wnl.  Judgment: Good.  Insight: Good.  Cognition: Oriented to person, place and time.  Sleep:  Number of Hours: 6.5    Vital Signs:Blood pressure 93/61, pulse 67, temperature 96.7 F (35.9 C), temperature source Oral, resp. rate 20, height 5\' 10"  (1.778 m), weight 63.05 kg (139 lb).  Current Medications: Current Facility-Administered Medications  Medication Dose Route Frequency Provider Last Rate Last Dose  . acetaminophen (TYLENOL) tablet 650 mg  650 mg Oral Q6H PRN Mike Craze, MD   650 mg at 12/07/11 1655  . alum & mag hydroxide-simeth (MAALOX/MYLANTA) 200-200-20 MG/5ML suspension 30 mL  30 mL Oral Q4H  PRN Mike Craze, MD      . amLODipine (NORVASC) tablet 5 mg  5 mg Oral Daily Curlene Labrum Mirenda Baltazar, MD   5 mg at 12/09/11 0805  . atenolol (TENORMIN) tablet 50 mg  50 mg Oral BH-qamhs Curlene Labrum Gianella Chismar, MD   50 mg at 12/09/11 0806  . clobetasol cream (TEMOVATE) 0.05 %   Topical BID Curlene Labrum Braeleigh Pyper, MD      . clonazePAM (KLONOPIN) tablet 0.5 mg  0.5 mg Oral BH-[id number] Curlene Labrum Zylpha Poynor, MD   0.5 mg at 12/09/11 1354  . cloNIDine (CATAPRES) tablet 0.3 mg  0.3 mg Oral BID Curlene Labrum Keiron Iodice, MD   0.3 mg at 12/08/11 1700  . etanercept (ENBREL) 25 MG injection KIT 25 mg  25 mg Subcutaneous 2 times weekly Curlene Labrum Mikaele Stecher, MD      . gabapentin (NEURONTIN) capsule 300 mg  300 mg Oral BH-[id number] Curlene Labrum Brittane Grudzinski, MD   300 mg at 12/09/11 1354  . ibuprofen (ADVIL,MOTRIN) 800 MG tablet        800 mg at 12/09/11 0910  . lamoTRIgine (LAMICTAL) tablet 150 mg  150 mg Oral BH-qamhs Curlene Labrum Roslynn Holte, MD   150 mg at 12/09/11 0806  . lithium carbonate capsule 600 mg  600 mg Oral BID WC Curlene Labrum Sly Parlee, MD   600 mg at 12/09/11 0805  . magnesium hydroxide (MILK OF MAGNESIA) suspension 30 mL  30 mL Oral Daily PRN Mike Craze, MD      . nicotine (NICODERM CQ - dosed in mg/24 hours) patch 14 mg  14  mg Transdermal Daily Curlene Labrum Declan Adamson, MD   14 mg at 12/09/11 2956  . sertraline (ZOLOFT) tablet 50 mg  50 mg Oral Daily Curlene Labrum Belinda Schlichting, MD   50 mg at 12/09/11 0806  . traZODone (DESYREL) tablet 100 mg  100 mg Oral BH-qamhs Curlene Labrum Mico Spark, MD   100 mg at 12/09/11 2130   Lab Results:  Results for orders placed during the hospital encounter of 12/05/11 (from the past 48 hour(s))  LITHIUM LEVEL     Status: Normal   Collection Time   12/08/11  7:40 PM      Component Value Range Comment   Lithium Lvl 1.08  0.80 - 1.40 (mEq/L)    Review of Systems:  Neurological: No headaches, seizures or dizziness reported.  G.I.: The patient denies any constipation or stomach upset today.  Musculoskeletal: The patient reports significant  joint pain today and has a history of degenerative joint disease.   Time was spent today discussing with the patient his current symptoms. The patient reports that he is sleeping well without difficulty and reports a good appetite.  He reports mild depression and anxiety and denies any SI/HI.  He also denies any auditory or visual hallucinations or delusional thinking.  The patient reports some excessive daytime sedation with the medication Trazodone and reports moderate to severe joint pain.  He states he has done well on the medication Ibuprofen in the past and would prefer this to Tylenol.  It was discussed with the patient the possibility of discharge tomorrow and he stated "whatever you think is best."  Treatment Plan Summary:  1. Daily contact with patient to assess and evaluate symptoms and progress in treatment.  2. Medication management  3. The patient will deny suicidal ideations or homicidal ideations for 48 hours prior to discharge and have a depression and anxiety rating of 3 or less. The patient will also deny any auditory or visual hallucinations or delusional thinking.  4. The patient will deny any symptoms of substance withdrawal at time of discharge.   Plan:  1. Will continue the patient on his current medications. 2. At the patient's request, will start Ibuprofen 800 mgs po q 8 hours - prn - joint pain. 3. Laboratory Studies reviewed.  4. Will decrease the medication Trazodone to 50 mgs po q am and 100 mgs po qhs due to excessive daytime sedation. 5..Will continue to monitor.  6. Possible discharge tomorrow.  Takina Busser 12/09/2011, 3:49 PM

## 2011-12-09 NOTE — Progress Notes (Signed)
[facility] Group Notes:  (Counselor/Nursing/MHT/Case Management/Adjunct)  12/09/2011 6:41 PM  Type of Therapy:  Group Therapy  Participation Level:  Active  Participation Quality:  Attentive and Sharing  Affect:  Depressed  Cognitive:  Oriented  Insight:  Limited  Engagement in Group:  Good  Engagement in Therapy:  Good  Modes of Intervention:  Clarification, Education, Problem-solving and Support  Summary of Progress/Problems: Patient talked about wife being supportive by calling him early and making sure he is up. She shows an interest in him but he also described her as a drunk and something he has put up with for years. He stated that she doesn't like him taking Klonopin because it makes him sleep, and she wants him up to be able to talk to her. Not very complimentary of their relationship. Reported feeling better with the change in medications.   [name] 12/09/2011, 6:41 PM

## 2011-12-10 MED ORDER — NICOTINE 14 MG/24HR TD PT24: 1.0000 | MEDICATED_PATCH | Freq: Every day | TRANSDERMAL | Status: DC

## 2011-12-10 MED ORDER — AMLODIPINE BESYLATE 5 MG PO TABS
5.0000 mg | ORAL_TABLET | Freq: Every day | ORAL | Status: DC
Start: 2011-12-10 — End: 2011-12-23

## 2011-12-10 MED ORDER — CLONIDINE HCL 0.3 MG PO TABS
0.3000 mg | ORAL_TABLET | Freq: Two times a day (BID) | ORAL | Status: DC
Start: 2011-12-10 — End: 2011-12-23

## 2011-12-10 MED ORDER — TRAZODONE HCL 100 MG PO TABS: 100.0000 mg | ORAL_TABLET | Freq: Two times a day (BID) | ORAL | Status: DC

## 2011-12-10 MED ORDER — TRAZODONE HCL 50 MG PO TABS: ORAL_TABLET | ORAL | Status: DC

## 2011-12-10 MED ORDER — TRAZODONE HCL 50 MG PO TABS
50.0000 mg | ORAL_TABLET | Freq: Every morning | ORAL | Status: DC
  Filled 2011-12-10: qty 1

## 2011-12-10 MED ORDER — LAMOTRIGINE 150 MG PO TABS: 150.0000 mg | ORAL_TABLET | ORAL | Status: DC

## 2011-12-10 MED ORDER — ETANERCEPT 50 MG/ML ~~LOC~~ SOLN: 25.0000 mg | SUBCUTANEOUS | Status: DC

## 2011-12-10 MED ORDER — GABAPENTIN 300 MG PO CAPS: 300.0000 mg | ORAL_CAPSULE | Freq: Three times a day (TID) | ORAL | Status: DC

## 2011-12-10 MED ORDER — ATENOLOL 25 MG PO TABS: 50.0000 mg | ORAL_TABLET | Freq: Two times a day (BID) | ORAL | Status: DC

## 2011-12-10 MED ORDER — TRAZODONE HCL 100 MG PO TABS: 100.0000 mg | ORAL_TABLET | Freq: Every day | ORAL | Status: DC

## 2011-12-10 MED ORDER — AMLODIPINE BESYLATE 5 MG PO TABS: 5.0000 mg | ORAL_TABLET | Freq: Every day | ORAL | Status: DC

## 2011-12-10 MED ORDER — SERTRALINE HCL 50 MG PO TABS: 50.0000 mg | ORAL_TABLET | Freq: Every day | ORAL | Status: DC

## 2011-12-10 MED ORDER — LITHIUM CARBONATE 600 MG PO CAPS: 600.0000 mg | ORAL_CAPSULE | Freq: Two times a day (BID) | ORAL | Status: DC

## 2011-12-10 MED ORDER — CLONAZEPAM 0.5 MG PO TABS: 0.5000 mg | ORAL_TABLET | ORAL | Status: DC

## 2011-12-10 MED ORDER — TRAZODONE HCL 100 MG PO TABS
100.0000 mg | ORAL_TABLET | Freq: Every day | ORAL | Status: DC
  Filled 2011-12-10: qty 1

## 2011-12-10 NOTE — Discharge Summary (Signed)
Physician Discharge Summary Note  Patient:  [name] is an 51 y.o., male MRN:  [id number] DOB:  [birth year] Patient phone:  [phone] (home)  Patient address:   [address] [place] [address],   Date of Admission:  12/05/2011 Date of Discharge: 12/10/2011  Reason for Admission: Medication management  Discharge Diagnoses: Principal Problem:  *Bipolar 1 disorder, depressed, moderate Active Problems:  Generalized anxiety disorder  Alcohol dependence in remission   Axis Diagnosis:  Discharge Diagnoses:  AXIS I: Bipolar I Disorder - Depressed.  Generalized Anxiety Disorder.  Alcohol Dependence - In Remission x 10 years.  AXIS II: Deferred.  AXIS III: 1. Tachycardia.  2. Peripheral Neuropathy.  3. Avascular Necrosis.  4. Psoriasis.  5. Hypertension.  6. Hepatitis C.  AXIS IV: Chronic Mental Illness. Chronic Non-psychiatric medical issues.  AXIS V: GAF at time of admission approximately 35. GAF at time of discharge approximately 55.   Level of Care:  OP  Hospital Course:  [name] was admitted after a brief stay in the medical unit after he decompensated after some of his psychiatric medications were changed.  He was stabilized and transferred to [facility] for the appropriate medication adjustments to his regimen.  He has used Lithium for years and was distraught when another provider discontinued it.  There was some concern that he may have been toxic, but there was also the increased sedation and lethargy with the prozac.  Upon arrival at the unit he was restarted on his Lithium at his request, but at a lower dose.  He responded well to the medication and showed good improvement over the next several days.  He stated he had no side effects. He attended groups and unit programming and worked closely with the CM to establish an appropriate plan for his discharge.     His pain was managed with NSAIDs such as Ibuprofen, rather than Tylenol due to his Hepatitis C. On the day  of discharge, [name] met with the treatment team.  He noted that he had clearer thought process, was never suicidal, was sleeping well, and was in full contact with reality.  The treatment team felt that he was ready for discharge and that his follow up plan of care would be very beneficial for him.        Consults:  None  Significant Diagnostic Studies:  None  Discharge Vitals:   Blood pressure 108/68, pulse 64, temperature 97 F (36.1 C), temperature source Oral, resp. rate 20, height 5\' 10"  (1.778 m), weight 63.05 kg (139 lb).  Mental Status Exam: See Mental Status Examination and Suicide Risk Assessment completed by Attending Physician prior to discharge.  Discharge destination:  Home  Is patient on multiple antipsychotic therapies at discharge:  No   Has Patient had three or more failed trials of antipsychotic monotherapy by history:  No  Recommended Plan for Multiple Antipsychotic Therapies: Not applicable.   Discharge Orders    Future Orders Please Complete By Expires   Diet - low sodium heart healthy      Increase activity slowly      Discharge instructions      Comments:   Take all medications as prescribed, keep all follow up appointments as scheduled to get your refills.     Medication List  As of 12/10/2011 11:43 AM   STOP taking these medications         buPROPion 100 MG 12 hr tablet      sildenafil 50 MG tablet  TAKE these medications      Indication    amLODipine 5 MG tablet   Commonly known as: NORVASC   Take 1 tablet (5 mg total) by mouth daily. For hpertension.    Indication: High Blood Pressure      atenolol 25 MG tablet   Commonly known as: TENORMIN   Take 2 tablets (50 mg total) by mouth 2 (two) times daily. For hypertension.    Indication: High Blood Pressure      clobetasol 0.05 % external solution   Commonly known as: TEMOVATE   Apply 1 application topically 2 (two) times daily. Applied to sensitive areas for psoriasis.     Psoriasis    clonazePAM 0.5 MG tablet   Commonly known as: KLONOPIN   Take 1 tablet (0.5 mg total) by mouth 3 (three) times daily at 8am, 2pm and bedtime. For anxiety.    Indication: Manic-Depression      cloNIDine 0.3 MG tablet   Commonly known as: CATAPRES   Take 1 tablet (0.3 mg total) by mouth 2 (two) times daily. For hypertension.    Indication: High Blood Pressure      etanercept 50 MG/ML injection   Commonly known as: ENBREL   Inject 25 mg into the skin 2 (two) times a week. Given on Tuesdays and Saturdays.    for psoriasis    gabapentin 300 MG capsule   Commonly known as: NEURONTIN   Take 1 capsule (300 mg total) by mouth 3 (three) times daily. For neuropathic pain.    Indication: Pain      lamoTRIgine 150 MG tablet   Commonly known as: LAMICTAL   Take 1 tablet (150 mg total) by mouth 2 (two) times daily in the am and at bedtime.. For mood stability.   For Mood Stabilization.    lithium 600 MG capsule   Take 1 capsule (600 mg total) by mouth 2 (two) times daily with a meal. For mood stability.    Indication: Manic-Depression      nicotine 14 mg/24hr patch   Commonly known as: NICODERM CQ - dosed in mg/24 hours   Place 1 patch onto the skin daily.   For nicotine replacement.    sertraline 50 MG tablet   Commonly known as: ZOLOFT   Take 1 tablet (50 mg total) by mouth daily. For anxiety and depression.    Indication: Anxiety Disorder      traZODone 50 MG tablet   Commonly known as: DESYREL   Take 1 tablet (50mg ) each morning for anxiety and 2 tablets (100mg ) at bedtime for sleep            Follow-up Information    Follow up with [facility] on 12/12/2011. (1:00 with Dr Ladona Ridgel)    Contact information:   [address]  [place]  [336] [phone]      Follow up with [facility] on 12/11/2011. (Monday - Thursday 10:00-11:30am classes)    Contact information:   [address] [place]  [address] Telephone:  [phone]         Follow-up  recommendations:  Activity:  as tolerated,  Diet:  heart healthy  Comments:  None  Signed: Lloyd Huger T. [place] PAC For Dr. Harvie Heck D. Stellarose Cerny 12/10/2011, 11:43 AM

## 2011-12-10 NOTE — Discharge Planning (Signed)
Spoke with wife yesterday who feels like Mamoudou is at baseline and would be glad to pick him up today at 2:00.  She was very appreciative of the Doctor's willingness to sign FMLA papers for her as she has been out of work since this whole saga began back on the 26th of April.  Diondre will follow up with Dr Ladona Ridgel at [facility] on Thursday.  Likely d/c today.

## 2011-12-10 NOTE — Progress Notes (Signed)
Patient ID: [name], male   DOB: 1961-04-02, 51 y.o.   MRN: [id number] Pt. Denies lethality and A/V/H's or other problems. Pt. and wife were given discharge instructions and both signed for release of Pt.'s information and instructions. Pt. was also given medications with printed instructions and follow-up appointment: both stated they understood.  Pt.'s belongings were returned to him and wife assisted with carrying them out.

## 2011-12-10 NOTE — Progress Notes (Signed)
[facility] Group Notes:  (Counselor/Nursing/MHT/Case Management/Adjunct)  12/10/2011 8:04 AM  Type of Therapy:  Group Therapy 12/06/11  Participation Level:  Did Not Attend    [name] 12/10/2011, 8:04 AM

## 2011-12-10 NOTE — Progress Notes (Signed)
Patient discharged to home.  Reviewed all discharge instructions, medications, and follow up care with patient and his wife.  Both verbalized understanding of all.  Two week supply of medications sent home with patient.  All belongings retrieved from room and from locker.  Patient left the unit ambulatory with RN and was escorted to the front lobby.

## 2011-12-10 NOTE — Progress Notes (Signed)
[facility] Case Management Discharge Plan:  Will you be returning to the same living situation after discharge: Yes,  home At discharge, do you have transportation home?:Yes,  wife Do you have the ability to pay for your medications:Yes,  mental health  Interagency Information:     Release of information consent forms completed and in the chart;  Patient's signature needed at discharge.  Patient to Follow up at:  Follow-up Information    Follow up with [facility] on 12/12/2011. (1:00 with Dr Ladona Ridgel)    Contact information:   [address]  [place]  [336] [phone]      Follow up with [facility] on 12/11/2011. (Monday - Thursday 10:00-11:30am classes)    Contact information:   [address] [place]  [address] Telephone:  [phone]         Patient denies SI/HI:   Yes,  yes    Safety Planning and Suicide Prevention discussed:  Yes,  yes  Barrier to discharge identified:No.  Summary and Recommendations:   Ida Rogue 12/10/2011, 2:12 PM

## 2011-12-10 NOTE — Progress Notes (Signed)
[facility] Group Notes:  (Counselor/Nursing/MHT/Case Management/Adjunct)  12/10/2011 10:05 AM  Type of Therapy:  Group Therapy  Participation Level:  Did Not Attend    [name] 12/10/2011, 10:05 AM

## 2011-12-10 NOTE — [facility] Suicide Risk Assessment (Signed)
Suicide Risk Assessment  Discharge Assessment     Demographic factors:  Male;Caucasian  Current Mental Status Per Nursing Assessment::   On Admission:   (denies) At Discharge:  AO x 3.  The patient reports mild depressive symptoms as well as mild anxiety.  He denies any auditory or visual hallucinations or delusional thinking as well as any SI/HI.  Current Mental Status Per Physician:  Diagnosis:  Axis I: Bipolar I Disorder - Depressed.  Generalized Anxiety Disorder.  Alcohol Dependence - In Remission x 10 years.   The patient was seen today and reports the following:   ADL's: Intact.  Sleep: The patient reports to sleeping well last night without difficulty.  Appetite: The patient reports an ongoing good appetite today.   Mild>(1-10) >Severe  Hopelessness (1-10): 0  Depression (1-10): 2  Anxiety (1-10): 3   Suicidal Ideation: The patient adamantly denies any suicidal ideations today.  Plan: No  Intent: No  Means: No   Homicidal Ideation: The patient adamantly denies any homicidal ideations today.  Plan: No  Intent: No.  Means: No   General Appearance/Behavior: The patient remains friendly and cooperative today with this provider.  Eye Contact: Good.  Speech: Appropriate in rate and volume with no pressuring of speech noted today.  Motor Behavior: wnl.  Level of Consciousness: Alert and Oriented x 3.  Mental Status: Alert and Oriented x 3.  Mood: Mildly Depressed.  Affect: Mildly Constricted.  Anxiety Level: Mild anxiety reported today.  Thought Process: wnl.  Thought Content: The patient denies any auditory or visual hallucinations or delusional thinking.  Perception:. wnl.  Judgment: Good.  Insight: Good.  Cognition: Oriented to person, place and time.   Loss Factors: Decline in physical health  Historical Factors: Family history of mental illness or substance abuse;Impulsivity  Risk Reduction Factors:   Good family support.  Good access to healthcare.     Continued Clinical Symptoms:  Depression:   Anhedonia Previous Psychiatric Diagnoses and Treatments Medical Diagnoses and Treatments/Surgeries Bipolar I Disorder - Depressed.  Discharge Diagnoses:   AXIS I:   Bipolar I Disorder - Depressed.    Generalized Anxiety Disorder.    Alcohol Dependence - In Remission x 10 years.  AXIS II:   Deferred. AXIS III:   1.  Tachycardia.   2.  Peripheral Neuropathy.   3.  Avascular Necrosis.   4.  Psoriasis.   5.  Hypertension.   6.  Hepatitis C. AXIS IV:   Chronic Mental Illness.  Chronic Non-psychiatric medical issues. AXIS V:   GAF at time of admission approximately 35.  GAF at time of discharge approximately 55.  Cognitive Features That Contribute To Risk:  None Noted.  Review of Systems:  Neurological: No headaches, seizures or dizziness reported.  G.I.: The patient denies any constipation or stomach upset today.  Musculoskeletal: The patient reports significant joint pain today and has a history of degenerative joint disease.   Time was spent today discussing with the patient his current symptoms. The patient reports that he is sleeping well without difficulty and reports a good appetite. He reports mild depression and anxiety and denies any SI/HI. He also denies any auditory or visual hallucinations or delusional thinking. The patient denies any morning sedation since decreasing the medication Trazodone in the morning.  The patient reports that his anxiety symptoms are under good control and the medication Ibuprofen has greatly helped his pain level.  Overall, he states he feels ready for discharge today.  Treatment Plan Summary:  1. Daily contact with patient to assess and evaluate symptoms and progress in treatment.  2. Medication management  3. The patient will deny suicidal ideations or homicidal ideations for 48 hours prior to discharge and have a depression and anxiety rating of 3 or less. The patient will also deny any auditory or  visual hallucinations or delusional thinking.  4. The patient will deny any symptoms of substance withdrawal at time of discharge.   Plan:  1. Will continue the patient on his current medications.  2. Laboratory Studies reviewed.   3. Will continue to monitor.  4. Discharge today to outpatient follow up.   Suicide Risk:  Minimal: No identifiable suicidal ideation.  Patients presenting with no risk factors but with morbid ruminations; may be classified as minimal risk based on the severity of the depressive symptoms  Plan Of Care/Follow-up recommendations:  Activity:  As tolerated. Diet:  Heart Healty Diet. Other:  Please take all medications only as directed and keep all scheduled follow up appointments.  [name] 12/10/2011, 1:08 PM

## 2011-12-10 NOTE — Treatment Plan (Signed)
Interdisciplinary Treatment Plan Update (Adult)  Date: 12/10/2011  Time Reviewed: 8:23 AM   Progress in Treatment: Attending groups: Yes Participating in groups: Yes Taking medication as prescribed: Yes Tolerating medication: Yes   Family/Significant othe contact made:  Yes, with wife Patient understands diagnosis:  Yes Discussing patient identified problems/goals with staff:  Yes Medical problems stabilized or resolved:  Yes Denies suicidal/homicidal ideation: Yes In tx team Issues/concerns per patient self-inventory: None noted Other:  New problem(s) identified: N/A  Reason for Continuation of Hospitalization: Other; describe D/C today  Interventions implemented related to continuation of hospitalization:   Additional comments:  N/A  Estimated length of stay:  Discharge today  Discharge Plan: Return home, follow up [facility]  New goal(s): N/A  Review of initial/current patient goals per problem list:    1.  Goal(s):Decrease depression and anxiety  Met:  Yes  Target date:5/7  As evidenced [id number] at "2" today and anxiety at "3" today  2.  Goal (s): Identify a comprehensive mental wellness plan  Met:  Yes  Target date:5/6  As evidenced by:  Will go [facility]    Attendees: Patient:  [name] 12/10/2011 8:23 AM  Family:     Physician: Harvie Heck Readling 12/10/2011 8:23 AM   Nursing:   Izola Price, RN 12/10/2011 8:23 AM   Case Manager:  Richelle Ito, LCSW 12/10/2011 8:23 AM   Counselor:  [name] 12/10/2011 8:23 AM   Other:  Verne Spurr 12/10/2011 8:23 AM  Other:  Ambrose Mantle, LCSW 12/10/2011 10:48 AM   Other:     Other:      Scribe for Treatment Team:   Ida Rogue, 12/10/2011 8:23 AM

## 2011-12-13 NOTE — Progress Notes (Signed)
Patient Discharge Instructions:  After Visit Summary (AVS):   Faxed to:  12/11/2011 Face Sheet:   Faxed to:  12/11/2011 Psychiatric Admission Assessment Note:   Faxed to:  12/11/2011 Suicide Risk Assessment - Discharge Assessment:   Faxed to:  12/11/2011 Faxed/Sent to the Next Level Care provider:  12/11/2011  Faxed to [facility] @ [phone]  Heloise Purpura Eduard Clos, 12/13/2011, 2:07 PM

## 2011-12-14 ENCOUNTER — Encounter (HOSPITAL_COMMUNITY): Payer: Self-pay | Admitting: Nurse Practitioner

## 2011-12-14 ENCOUNTER — Emergency Department (HOSPITAL_COMMUNITY)
Admission: EM | Admit: 2011-12-14 | Discharge: 2011-12-16 | Disposition: A | Discharge status: Other Acute Inpatient Hospital | Payer: Medicare Other | Source: Home / Self Care | Attending: Emergency Medicine | Admitting: Emergency Medicine

## 2011-12-14 ENCOUNTER — Emergency Department (HOSPITAL_COMMUNITY): Payer: Medicare Other

## 2011-12-14 DIAGNOSIS — I10 Essential (primary) hypertension: Secondary | ICD-10-CM | POA: Insufficient documentation

## 2011-12-14 DIAGNOSIS — F23 Brief psychotic disorder: Secondary | ICD-10-CM

## 2011-12-14 DIAGNOSIS — Z79899 Other long term (current) drug therapy: Secondary | ICD-10-CM | POA: Insufficient documentation

## 2011-12-14 DIAGNOSIS — R4182 Altered mental status, unspecified: Secondary | ICD-10-CM | POA: Insufficient documentation

## 2011-12-14 DIAGNOSIS — F319 Bipolar disorder, unspecified: Secondary | ICD-10-CM | POA: Insufficient documentation

## 2011-12-14 DIAGNOSIS — F29 Unspecified psychosis not due to a substance or known physiological condition: Secondary | ICD-10-CM | POA: Insufficient documentation

## 2011-12-14 HISTORY — DX: Dorsalgia, unspecified: M54.9

## 2011-12-14 HISTORY — DX: Unspecified cirrhosis of liver: K74.60

## 2011-12-14 LAB — DIFFERENTIAL
Basophils Absolute: 0 10*3/uL (ref 0.0–0.1)
Basophils Relative: 1 % (ref 0–1)
Eosinophils Absolute: 0.1 10*3/uL (ref 0.0–0.7)
Lymphocytes Relative: 32 % (ref 12–46)
Neutrophils Relative %: 55 % (ref 43–77)

## 2011-12-14 LAB — URINALYSIS, ROUTINE W REFLEX MICROSCOPIC
Bilirubin Urine: NEGATIVE
Glucose, UA: NEGATIVE mg/dL
Ketones, ur: NEGATIVE mg/dL
Specific Gravity, Urine: 1.016 (ref 1.005–1.030)
pH: 7.5 (ref 5.0–8.0)

## 2011-12-14 LAB — COMPREHENSIVE METABOLIC PANEL
ALT: 59 U/L — ABNORMAL HIGH (ref 0–53)
AST: 62 U/L — ABNORMAL HIGH (ref 0–37)
Albumin: 3.4 g/dL — ABNORMAL LOW (ref 3.5–5.2)
Alkaline Phosphatase: 105 U/L (ref 39–117)
Potassium: 4.6 mEq/L (ref 3.5–5.1)
Sodium: 136 mEq/L (ref 135–145)
Total Protein: 6.6 g/dL (ref 6.0–8.3)

## 2011-12-14 LAB — CBC
Hemoglobin: 12.2 g/dL — ABNORMAL LOW (ref 13.0–17.0)
MCHC: 32.8 g/dL (ref 30.0–36.0)
Platelets: 78 10*3/uL — ABNORMAL LOW (ref 150–400)
RDW: 14.2 % (ref 11.5–15.5)

## 2011-12-14 MED ORDER — SODIUM CHLORIDE 0.9 % IV SOLN
INTRAVENOUS | Status: DC
  Administered 2011-12-15: 125 mL/h via INTRAVENOUS
  Administered 2011-12-15: 05:00:00 via INTRAVENOUS

## 2011-12-14 NOTE — ED Provider Notes (Addendum)
History     CSN: [id number]  Arrival date & time 12/14/11  2037   First MD Initiated Contact with Patient 12/14/11 2225      Chief Complaint  Patient presents with  . Altered Mental Status    (Consider location/radiation/quality/duration/timing/severity/associated sxs/prior treatment) HPI Comments: The patient is a 51 year old man with a history of mental illness. He is brought to the hospital by his brother. He has a history of severe depression, who was recently hospitalized at [facility]. He was released as this past Wednesday, 3 days ago. Today he was at home very confused. He said in front of a door and fiddled around, thinking he was repairing a car. He has wanted to his house. His behavioral arm his wife. Therefore he was brought back to [facility] for evaluation. The brother had called behavioral health and they have advised going to the hospital either tomorrow or on an emergent basis tonight.  Patient is a 51 y.o. male presenting with altered mental status. The history is provided by a relative and medical records. No language interpreter was used.  Altered Mental Status This is a recurrent problem. Episode onset: Pt's behavior seemed to deteriorate today with increased confusion, hallucinations. The problem occurs constantly. The problem has not changed since onset.Associated symptoms comments: Nothing . The symptoms are aggravated by nothing. The symptoms are relieved by nothing. Treatments tried: He has reportedly been taking his prescribed medicines. The treatment provided no relief.    Past Medical History  Diagnosis Date  . Tachycardia   . Peripheral neuropathy   . Alcohol abuse     hx of quit 4/04  . Bilateral external ear infections 2007  . Tobacco abuse   . Bipolar affective disorder     Followed by Dr. [name]  . Avascular necrosis     SP right and left hip replacements and L shoulder arthroplasty, secondary to steroid use.  Has been on  embrel in past.  . Psoriasis     Previously on embrel, stopped because of price.   . Hypertension   . Hepatitis C   . ARF (acute renal failure) 2008    Multifactorial in setting of over use of ibuprofen and HCTZ./ACEI  . Cirrhosis   . Back pain     Past Surgical History  Procedure Date  . Right total hip replacement 2004    Due to AVN  . Left total hip replacement 2001    Due to AVN  . Closed manipulation, pinning, application of a traction pin right 2002  . Right shoulder arthroplasty     Due to AVN  . Appendectomy 1989    Family History  Problem Relation Age of Onset  . Hypertension Mother   . Bipolar disorder Father   . Obesity Father   . Obesity Brother     History  Substance Use Topics  . Smoking status: Current Everyday Smoker -- 1.0 packs/day for 35 years    Types: Cigarettes  . Smokeless tobacco: Never Used   Comment: Not interested in stopping.  . Alcohol Use: No     Previously alcoholic, denies ETOH for "past few weeks"      Review of Systems  Unable to perform ROS: Psychiatric disorder  Psychiatric/Behavioral: Positive for altered mental status.    Allergies  Erythromycin; Fentanyl citrate; Hydromorphone hcl; and Prochlorperazine maleate  Home Medications   Current Outpatient Rx  Name Route Sig Dispense Refill  . AMLODIPINE BESYLATE 5 MG PO TABS  Oral Take 1 tablet (5 mg total) by mouth daily. For hpertension. 30 tablet 0  . ATENOLOL 25 MG PO TABS Oral Take 2 tablets (50 mg total) by mouth 2 (two) times daily. For hypertension. 120 tablet 0  . CLOBETASOL PROPIONATE 0.05 % EX SOLN Topical Apply 1 application topically 2 (two) times daily. Applied to sensitive areas for psoriasis.    Marland Kitchen CLONAZEPAM 0.5 MG PO TABS Oral Take 1 mg by mouth 2 (two) times daily. For anxiety.    Marland Kitchen CLONIDINE HCL 0.3 MG PO TABS Oral Take 1 tablet (0.3 mg total) by mouth 2 (two) times daily. For hypertension. 60 tablet 0  . ETANERCEPT 50 MG/ML [place] SOLN Subcutaneous Inject 0.49  mLs (25 mg total) into the skin 2 (two) times a week. Given on Tuesdays and Saturdays, for psoriasis. 0.98 mL 0  . GABAPENTIN 300 MG PO CAPS Oral Take 1 capsule (300 mg total) by mouth 3 (three) times daily. For neuropathic pain. 90 capsule 0  . LAMOTRIGINE 150 MG PO TABS Oral Take 1 tablet (150 mg total) by mouth 2 (two) times daily in the am and at bedtime.. For mood stability. 60 tablet 0  . LITHIUM CARBONATE 600 MG PO CAPS Oral Take 1 capsule (600 mg total) by mouth 2 (two) times daily with a meal. For mood stability. 60 capsule 0  . SERTRALINE HCL 50 MG PO TABS Oral Take 1 tablet (50 mg total) by mouth daily. For anxiety and depression. 30 tablet 0  . TRAZODONE HCL 50 MG PO TABS  Take 1 tablet (50mg ) each morning for anxiety and 2 tablets (100mg ) at bedtime for sleep 30 tablet 0    BP 143/77  Pulse 59  Temp(Src) 98.1 F (36.7 C) (Oral)  Resp 14  Ht 5\' 11"  (1.803 m)  SpO2 99%  Physical Exam  Nursing note and vitals reviewed. Constitutional:       Patient sits, staring into space. He has not responded my questions, but occasionally will snarl at his brother.  HENT:  Head: Normocephalic and atraumatic.  Right Ear: External ear normal.  Left Ear: External ear normal.  Mouth/Throat: Oropharynx is clear and moist.  Eyes: Conjunctivae and EOM are normal. Pupils are equal, round, and reactive to light.  Neck: Normal range of motion. Neck supple.  Cardiovascular: Normal rate, regular rhythm and normal heart sounds.   Pulmonary/Chest: Effort normal and breath sounds normal.  Abdominal: Soft. Bowel sounds are normal.  Musculoskeletal: Normal range of motion. He exhibits no edema and no tenderness.  Neurological: He is alert.       No apparent sensory or motor deficit.   Skin: Skin is warm and dry.  Psychiatric:       Pt sits staring into space, does not speak.      ED Course  Procedures (including critical care time)  Labs Reviewed  URINALYSIS, ROUTINE W REFLEX MICROSCOPIC -  Abnormal; Notable for the following:    APPearance HAZY (*)    All other components within normal limits  CBC  DIFFERENTIAL  COMPREHENSIVE METABOLIC PANEL  ETHANOL  URINE RAPID DRUG SCREEN (HOSP PERFORMED)  ACETAMINOPHEN LEVEL  SALICYLATE LEVEL  TRICYCLICS SCREEN, URINE  LITHIUM LEVEL   Ct Head Wo Contrast  12/14/2011  *RADIOLOGY REPORT*  Clinical Data: Confusion.  CT HEAD WITHOUT CONTRAST  Technique:  Contiguous axial images were obtained from the base of the skull through the vertex without contrast.  Comparison: Head CT 11/29/2011.  Findings: There is no evidence  of acute intracranial abnormality including infarction, hemorrhage, mass, mass effect, midline shift or abnormal extra-axial fluid collection.  No hydrocephalus or pneumocephalus.  Calvarium intact.  Imaged paranasal sinuses and mastoid air cells are clear.  Carotid atherosclerosis noted.  IMPRESSION: No acute finding.  Original Report Authenticated By: Bernadene Bell. Maricela Curet, M.D.    Date: 12/14/2011  Rate: 59  Rhythm: normal sinus rhythm  QRS Axis: left  Intervals: PR prolonged  ST/T Wave abnormalities: normal  Conduction Disutrbances:left anterior fascicular block  Narrative Interpretation: Abnormal EKG  Old EKG Reviewed: unchanged  1:01 AM Results for orders placed during the hospital encounter of 12/14/11  CBC      Component Value Range   WBC 3.3 (*) 4.0 - 10.5 (K/uL)   RBC 3.49 (*) 4.22 - 5.81 (MIL/uL)   Hemoglobin 12.2 (*) 13.0 - 17.0 (g/dL)   HCT 16.1 (*) 09.6 - 52.0 (%)   MCV 106.6 (*) 78.0 - 100.0 (fL)   MCH 35.0 (*) 26.0 - 34.0 (pg)   MCHC 32.8  30.0 - 36.0 (g/dL)   RDW 04.5  40.9 - 81.1 (%)   Platelets 78 (*) 150 - 400 (K/uL)  DIFFERENTIAL      Component Value Range   Neutrophils Relative 55  43 - 77 (%)   Lymphocytes Relative 32  12 - 46 (%)   Monocytes Relative 10  3 - 12 (%)   Eosinophils Relative 2  0 - 5 (%)   Basophils Relative 1  0 - 1 (%)   Neutro Abs 1.8  1.7 - 7.7 (K/uL)   Lymphs Abs 1.1   0.7 - 4.0 (K/uL)   Monocytes Absolute 0.3  0.1 - 1.0 (K/uL)   Eosinophils Absolute 0.1  0.0 - 0.7 (K/uL)   Basophils Absolute 0.0  0.0 - 0.1 (K/uL)   RBC Morphology POLYCHROMASIA PRESENT    COMPREHENSIVE METABOLIC PANEL      Component Value Range   Sodium 136  135 - 145 (mEq/L)   Potassium 4.6  3.5 - 5.1 (mEq/L)   Chloride 104  96 - 112 (mEq/L)   CO2 28  19 - 32 (mEq/L)   Glucose, Bld 95  70 - 99 (mg/dL)   BUN 15  6 - 23 (mg/dL)   Creatinine, Ser 9.14  0.50 - 1.35 (mg/dL)   Calcium 78.2  8.4 - 10.5 (mg/dL)   Total Protein 6.6  6.0 - 8.3 (g/dL)   Albumin 3.4 (*) 3.5 - 5.2 (g/dL)   AST 62 (*) 0 - 37 (U/L)   ALT 59 (*) 0 - 53 (U/L)   Alkaline Phosphatase 105  39 - 117 (U/L)   Total Bilirubin 0.2 (*) 0.3 - 1.2 (mg/dL)   GFR calc non Af Amer >90  >90 (mL/min)   GFR calc Af Amer >90  >90 (mL/min)  URINALYSIS, ROUTINE W REFLEX MICROSCOPIC      Component Value Range   Color, Urine YELLOW  YELLOW    APPearance HAZY (*) CLEAR    Specific Gravity, Urine 1.016  1.005 - 1.030    pH 7.5  5.0 - 8.0    Glucose, UA NEGATIVE  NEGATIVE (mg/dL)   Hgb urine dipstick NEGATIVE  NEGATIVE    Bilirubin Urine NEGATIVE  NEGATIVE    Ketones, ur NEGATIVE  NEGATIVE (mg/dL)   Protein, ur NEGATIVE  NEGATIVE (mg/dL)   Urobilinogen, UA 0.2  0.0 - 1.0 (mg/dL)   Nitrite NEGATIVE  NEGATIVE    Leukocytes, UA NEGATIVE  NEGATIVE   ETHANOL  Component Value Range   Alcohol, Ethyl (B) <11  0 - 11 (mg/dL)  URINE RAPID DRUG SCREEN (HOSP PERFORMED)      Component Value Range   Opiates POSITIVE (*) NONE DETECTED    Cocaine NONE DETECTED  NONE DETECTED    Benzodiazepines NONE DETECTED  NONE DETECTED    Amphetamines NONE DETECTED  NONE DETECTED    Tetrahydrocannabinol NONE DETECTED  NONE DETECTED    Barbiturates NONE DETECTED  NONE DETECTED   ACETAMINOPHEN LEVEL      Component Value Range   Acetaminophen (Tylenol), Serum <15.0  10 - 30 (ug/mL)  SALICYLATE LEVEL      Component Value Range   Salicylate Lvl <2.0  (*) 2.8 - 20.0 (mg/dL)  TRICYCLICS SCREEN, URINE      Component Value Range   TCA Scrn NONE DETECTED  NONE DETECTED   LITHIUM LEVEL      Component Value Range   Lithium Lvl 1.26  0.80 - 1.40 (mEq/L)    Dg Chest 2 View  12/14/2011  *RADIOLOGY REPORT*  Clinical Data: Altered mental status.  CHEST - 2 VIEW  Comparison: 12/03/2011  Findings: Cardiac enlargement with normal pulmonary vascularity. No focal airspace consolidation in the lungs.  No blunting of costophrenic angles.  No pneumothorax.  Old left rib fracture.  The postoperative changes in both shoulders.  No significant change since previous study.  IMPRESSION: Cardiac enlargement.  No evidence of active pulmonary disease.  Original Report Authenticated By: Marlon Pel, M.D.   Ct Head Wo Contrast  12/14/2011  *RADIOLOGY REPORT*  Clinical Data: Confusion.  CT HEAD WITHOUT CONTRAST  Technique:  Contiguous axial images were obtained from the base of the skull through the vertex without contrast.  Comparison: Head CT 11/29/2011.  Findings: There is no evidence of acute intracranial abnormality including infarction, hemorrhage, mass, mass effect, midline shift or abnormal extra-axial fluid collection.  No hydrocephalus or pneumocephalus.  Calvarium intact.  Imaged paranasal sinuses and mastoid air cells are clear.  Carotid atherosclerosis noted.  IMPRESSION: No acute finding.  Original Report Authenticated By: Bernadene Bell. Maricela Curet, M.D.   Lab tests showed therapeutic level of lithium.  UDS was positive for opiates.  Lab workup otherwise negative.  Medically cleared for psychiatric evaluation.   1. Acute psychosis             Carleene Cooper III, MD 12/15/11 0107     Carleene Cooper III, MD 12/15/11 [phone]

## 2011-12-14 NOTE — ED Notes (Signed)
Per ems: wife called ems for pt being confused throughout the day today. Wife reports recent hospitalizations for same with medication changes. Wife reports today he was acting bizarre: saying he was working on the car but he wasn't, wandering around the house aimlessly. Pt is alert, denies any complaints.

## 2011-12-14 NOTE — ED Notes (Signed)
Patient transported to CT 

## 2011-12-14 NOTE — ED Notes (Signed)
Patient transported to X-ray 

## 2011-12-15 LAB — RAPID URINE DRUG SCREEN, HOSP PERFORMED
Barbiturates: NOT DETECTED
Benzodiazepines: NOT DETECTED
Cocaine: NOT DETECTED
Opiates: POSITIVE — AB
Tetrahydrocannabinol: NOT DETECTED

## 2011-12-15 LAB — TRICYCLICS SCREEN, URINE: TCA Scrn: NOT DETECTED

## 2011-12-15 MED ORDER — NICOTINE 21 MG/24HR TD PT24
21.0000 mg | MEDICATED_PATCH | Freq: Once | TRANSDERMAL | Status: DC
  Administered 2011-12-15: 21 mg via TRANSDERMAL
  Filled 2011-12-15: qty 1

## 2011-12-15 MED ORDER — ONDANSETRON 4 MG PO TBDP: 8.0000 mg | ORAL_TABLET | Freq: Three times a day (TID) | ORAL | Status: DC | PRN

## 2011-12-15 MED ORDER — LORAZEPAM 1 MG PO TABS
2.0000 mg | ORAL_TABLET | Freq: Once | ORAL | Status: AC
  Administered 2011-12-15: 2 mg via ORAL
  Filled 2011-12-15: qty 2

## 2011-12-15 NOTE — ED Notes (Signed)
Patient resting at this time.  Family member has gone home
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
## 2011-12-15 NOTE — ED Provider Notes (Signed)
1:22 AM Spoke with the act team who will evaluate the patient for psychiatric placement.  Lyanne Co, MD 12/15/11 [phone]

## 2011-12-15 NOTE — ED Notes (Signed)
Ordered breakfast tray  

## 2011-12-15 NOTE — ED Provider Notes (Addendum)
Pt awaiting placement Given ativan for nervousness and he feels better. No delierum noted. Pt stable Toy Baker, MD 12/15/11 1256  Toy Baker, MD 12/15/11 1501

## 2011-12-15 NOTE — ED Notes (Signed)
If needed, call pt's brother Kathlene November at [phone].

## 2011-12-15 NOTE — BHH Counselor (Signed)
Pt has been referred to the following hospitals who report no bed availability at this time. [facility], [place], [facility], [place], [place], [place], [place], [facility], [facility] and [place]. Pt declined at [facility] due to no 400 beds available (psychotic beds) and pt declined at [facility]. [facility]). Pt is on the wait list for [facility] and [facility].

## 2011-12-16 ENCOUNTER — Encounter (HOSPITAL_COMMUNITY): Payer: Self-pay | Admitting: Emergency Medicine

## 2011-12-16 ENCOUNTER — Inpatient Hospital Stay (HOSPITAL_COMMUNITY)
Admission: AD | Admit: 2011-12-16 | Discharge: 2011-12-23 | DRG: 885 | Disposition: A | Discharge status: Home / Self Care | Payer: Medicare Other | Source: Ambulatory Visit | Attending: Psychiatry | Admitting: Psychiatry

## 2011-12-16 DIAGNOSIS — F319 Bipolar disorder, unspecified: Secondary | ICD-10-CM

## 2011-12-16 DIAGNOSIS — F331 Major depressive disorder, recurrent, moderate: Principal | ICD-10-CM | POA: Diagnosis present

## 2011-12-16 DIAGNOSIS — Z96619 Presence of unspecified artificial shoulder joint: Secondary | ICD-10-CM

## 2011-12-16 DIAGNOSIS — B1710 Acute hepatitis C without hepatic coma: Secondary | ICD-10-CM

## 2011-12-16 DIAGNOSIS — I10 Essential (primary) hypertension: Secondary | ICD-10-CM | POA: Diagnosis present

## 2011-12-16 DIAGNOSIS — G609 Hereditary and idiopathic neuropathy, unspecified: Secondary | ICD-10-CM | POA: Diagnosis present

## 2011-12-16 DIAGNOSIS — Z79899 Other long term (current) drug therapy: Secondary | ICD-10-CM

## 2011-12-16 DIAGNOSIS — Z9089 Acquired absence of other organs: Secondary | ICD-10-CM

## 2011-12-16 DIAGNOSIS — Z96649 Presence of unspecified artificial hip joint: Secondary | ICD-10-CM

## 2011-12-16 DIAGNOSIS — G259 Extrapyramidal and movement disorder, unspecified: Secondary | ICD-10-CM

## 2011-12-16 DIAGNOSIS — F411 Generalized anxiety disorder: Secondary | ICD-10-CM | POA: Diagnosis present

## 2011-12-16 DIAGNOSIS — F1110 Opioid abuse, uncomplicated: Secondary | ICD-10-CM | POA: Diagnosis present

## 2011-12-16 DIAGNOSIS — F528 Other sexual dysfunction not due to a substance or known physiological condition: Secondary | ICD-10-CM

## 2011-12-16 DIAGNOSIS — Z8679 Personal history of other diseases of the circulatory system: Secondary | ICD-10-CM

## 2011-12-16 DIAGNOSIS — F3132 Bipolar disorder, current episode depressed, moderate: Secondary | ICD-10-CM

## 2011-12-16 DIAGNOSIS — T50901A Poisoning by unspecified drugs, medicaments and biological substances, accidental (unintentional), initial encounter: Secondary | ICD-10-CM

## 2011-12-16 DIAGNOSIS — K089 Disorder of teeth and supporting structures, unspecified: Secondary | ICD-10-CM

## 2011-12-16 DIAGNOSIS — F17200 Nicotine dependence, unspecified, uncomplicated: Secondary | ICD-10-CM | POA: Diagnosis present

## 2011-12-16 DIAGNOSIS — M8700 Idiopathic aseptic necrosis of unspecified bone: Secondary | ICD-10-CM

## 2011-12-16 DIAGNOSIS — F1021 Alcohol dependence, in remission: Secondary | ICD-10-CM | POA: Diagnosis present

## 2011-12-16 DIAGNOSIS — B1920 Unspecified viral hepatitis C without hepatic coma: Secondary | ICD-10-CM | POA: Diagnosis present

## 2011-12-16 DIAGNOSIS — L408 Other psoriasis: Secondary | ICD-10-CM

## 2011-12-16 DIAGNOSIS — B999 Unspecified infectious disease: Secondary | ICD-10-CM

## 2011-12-16 DIAGNOSIS — M25519 Pain in unspecified shoulder: Secondary | ICD-10-CM

## 2011-12-16 MED ORDER — ATENOLOL 50 MG PO TABS
50.0000 mg | ORAL_TABLET | Freq: Two times a day (BID) | ORAL | Status: DC
  Administered 2011-12-16: 50 mg via ORAL
  Filled 2011-12-16: qty 1

## 2011-12-16 MED ORDER — GABAPENTIN 300 MG PO CAPS
300.0000 mg | ORAL_CAPSULE | ORAL | Status: DC
  Filled 2011-12-16: qty 1

## 2011-12-16 MED ORDER — ALUM & MAG HYDROXIDE-SIMETH 200-200-20 MG/5ML PO SUSP
30.0000 mL | ORAL | Status: DC | PRN
  Filled 2011-12-16: qty 30

## 2011-12-16 MED ORDER — CLONAZEPAM 1 MG PO TABS
1.0000 mg | ORAL_TABLET | Freq: Two times a day (BID) | ORAL | Status: DC
  Administered 2011-12-17: 1 mg via ORAL

## 2011-12-16 MED ORDER — CLONIDINE HCL 0.3 MG PO TABS
0.3000 mg | ORAL_TABLET | Freq: Two times a day (BID) | ORAL | Status: DC
  Filled 2011-12-16 (×4): qty 1

## 2011-12-16 MED ORDER — GABAPENTIN 300 MG PO CAPS: 300.0000 mg | ORAL_CAPSULE | Freq: Three times a day (TID) | ORAL | Status: DC

## 2011-12-16 MED ORDER — LAMOTRIGINE 100 MG PO TABS
150.0000 mg | ORAL_TABLET | ORAL | Status: DC
  Administered 2011-12-17 – 2011-12-23 (×13): 150 mg via ORAL
  Filled 2011-12-16 (×16): qty 1

## 2011-12-16 MED ORDER — AMLODIPINE BESYLATE 5 MG PO TABS
5.0000 mg | ORAL_TABLET | Freq: Every day | ORAL | Status: DC
  Filled 2011-12-16 (×2): qty 1

## 2011-12-16 MED ORDER — ZOLPIDEM TARTRATE 5 MG PO TABS: 5.0000 mg | ORAL_TABLET | Freq: Every evening | ORAL | Status: DC | PRN

## 2011-12-16 MED ORDER — ATENOLOL 50 MG PO TABS
50.0000 mg | ORAL_TABLET | Freq: Two times a day (BID) | ORAL | Status: DC
  Filled 2011-12-16 (×3): qty 1

## 2011-12-16 MED ORDER — ACETAMINOPHEN 325 MG PO TABS
650.0000 mg | ORAL_TABLET | Freq: Four times a day (QID) | ORAL | Status: DC | PRN
  Filled 2011-12-16: qty 2

## 2011-12-16 MED ORDER — ETANERCEPT 50 MG/ML ~~LOC~~ SOLN: 25.0000 mg | SUBCUTANEOUS | Status: DC

## 2011-12-16 MED ORDER — GABAPENTIN 300 MG PO CAPS
300.0000 mg | ORAL_CAPSULE | Freq: Three times a day (TID) | ORAL | Status: DC
  Filled 2011-12-16 (×5): qty 1

## 2011-12-16 MED ORDER — LAMOTRIGINE 150 MG PO TABS: 150.0000 mg | ORAL_TABLET | ORAL | Status: DC

## 2011-12-16 MED ORDER — SERTRALINE HCL 50 MG PO TABS
50.0000 mg | ORAL_TABLET | Freq: Every day | ORAL | Status: DC
  Filled 2011-12-16 (×2): qty 1

## 2011-12-16 MED ORDER — TRAZODONE HCL 50 MG PO TABS
50.0000 mg | ORAL_TABLET | Freq: Every day | ORAL | Status: DC
  Administered 2011-12-17: 50 mg via ORAL
  Filled 2011-12-16 (×3): qty 1

## 2011-12-16 MED ORDER — TRAZODONE HCL 100 MG PO TABS: 100.0000 mg | ORAL_TABLET | Freq: Every day | ORAL | Status: DC

## 2011-12-16 MED ORDER — CLOBETASOL PROPIONATE 0.05 % EX SOLN
1.0000 "application " | Freq: Two times a day (BID) | CUTANEOUS | Status: DC
  Filled 2011-12-16 (×18): qty 1

## 2011-12-16 MED ORDER — CLONIDINE HCL 0.1 MG PO TABS
0.3000 mg | ORAL_TABLET | Freq: Two times a day (BID) | ORAL | Status: DC
  Filled 2011-12-16: qty 3

## 2011-12-16 MED ORDER — MAGNESIUM HYDROXIDE 400 MG/5ML PO SUSP
30.0000 mL | Freq: Every day | ORAL | Status: DC | PRN
  Filled 2011-12-16: qty 30

## 2011-12-16 MED ORDER — IBUPROFEN 200 MG PO TABS: 600.0000 mg | ORAL_TABLET | Freq: Three times a day (TID) | ORAL | Status: DC | PRN

## 2011-12-16 MED ORDER — ETANERCEPT 50 MG/ML ~~LOC~~ SOLN
25.0000 mg | SUBCUTANEOUS | Status: DC
  Filled 2011-12-16 (×2): qty 0.98

## 2011-12-16 MED ORDER — SERTRALINE HCL 50 MG PO TABS
50.0000 mg | ORAL_TABLET | Freq: Every day | ORAL | Status: DC
  Administered 2011-12-16: 50 mg via ORAL
  Filled 2011-12-16: qty 1

## 2011-12-16 MED ORDER — AMLODIPINE BESYLATE 5 MG PO TABS
5.0000 mg | ORAL_TABLET | Freq: Every day | ORAL | Status: DC
  Administered 2011-12-16: 5 mg via ORAL
  Filled 2011-12-16: qty 1

## 2011-12-16 MED ORDER — LORAZEPAM 1 MG PO TABS
1.0000 mg | ORAL_TABLET | Freq: Three times a day (TID) | ORAL | Status: DC | PRN
  Administered 2011-12-16: 1 mg via ORAL
  Filled 2011-12-16: qty 1

## 2011-12-16 MED ORDER — LITHIUM CARBONATE 300 MG PO CAPS
600.0000 mg | ORAL_CAPSULE | Freq: Two times a day (BID) | ORAL | Status: DC
  Administered 2011-12-16: 600 mg via ORAL
  Filled 2011-12-16 (×2): qty 2

## 2011-12-16 MED ORDER — LITHIUM CARBONATE 300 MG PO CAPS
600.0000 mg | ORAL_CAPSULE | Freq: Two times a day (BID) | ORAL | Status: DC
  Administered 2011-12-17 – 2011-12-18 (×3): 600 mg via ORAL
  Filled 2011-12-16 (×6): qty 2
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
## 2011-12-16 NOTE — ED Notes (Signed)
Called pharmacy to address delay in getting pt meds which were entered for 3pm

## 2011-12-16 NOTE — ED Notes (Addendum)
Pt ambulated to bathroom without difficulty or complaints. Refused evening meal.

## 2011-12-16 NOTE — Tx Team (Signed)
Initial Interdisciplinary Treatment Plan  PATIENT STRENGTHS: (choose at least two) Ability for insight Average or above average intelligence Capable of independent living Communication skills Supportive family/friends  PATIENT STRESSORS: Health problems Medication change or noncompliance   PROBLEM LIST: Problem List/Patient Goals Date to be addressed Date deferred Reason deferred Estimated date of resolution  Medication Change 12/16/11     Psychosis 12/16/11                                                DISCHARGE CRITERIA:  Ability to meet basic life and health needs Adequate post-discharge living arrangements Improved stabilization in mood, thinking, and/or behavior Need for constant or close observation no longer present Safe-care adequate arrangements made Verbal commitment to aftercare and medication compliance  PRELIMINARY DISCHARGE PLAN: Attend aftercare/continuing care group Return to previous living arrangement  PATIENT/FAMIILY INVOLVEMENT: This treatment plan has been presented to and reviewed with the patient, [name], and/or family member. The patient and family have been given the opportunity to ask questions and make suggestions.  [name] 12/16/2011, 10:33 PM

## 2011-12-16 NOTE — ED Notes (Signed)
Await notice from act that it is ok for me to call report to [facility]

## 2011-12-16 NOTE — ED Notes (Signed)
Report given to [facility] 

## 2011-12-16 NOTE — Progress Notes (Signed)
Patient ID: [name], male   DOB: [birth year], 51 y.o.   MRN: [id number] Pt admitted to [facility] for medication adjustment. Pt voluntary, recently discharged from [facility] on 12/11/11. Pt states his wife noticed he was staggering and weak and sleeping >15 hours per day. Pt also confused at times per wife. Pt alert and oriented on admission, unsteady gait; pt denies SI/HI or plans to harm himself.

## 2011-12-16 NOTE — ED Notes (Signed)
Pt becoming anxious, pacing hallway, speaking loudly with pressured speech to his wife. Pt request medication for the anxiety. MD notified.
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
## 2011-12-16 NOTE — ED Notes (Signed)
Called security to have pt transported to [facility]

## 2011-12-16 NOTE — ED Notes (Signed)
Wife was called and updated on pt status of being on list at [place], [place] and [facility].

## 2011-12-16 NOTE — ED Notes (Signed)
IV site bloody but fluids infuse without difficulty, no swelling or redness noted

## 2011-12-17 DIAGNOSIS — F1110 Opioid abuse, uncomplicated: Secondary | ICD-10-CM

## 2011-12-17 MED ORDER — GABAPENTIN 300 MG PO CAPS
300.0000 mg | ORAL_CAPSULE | ORAL | Status: DC
  Administered 2011-12-17 – 2011-12-18 (×4): 300 mg via ORAL
  Filled 2011-12-17 (×7): qty 1

## 2011-12-17 MED ORDER — CLONIDINE HCL 0.1 MG PO TABS
0.3000 mg | ORAL_TABLET | ORAL | Status: DC
  Administered 2011-12-18 – 2011-12-23 (×10): 0.3 mg via ORAL
  Filled 2011-12-17 (×12): qty 1
  Filled 2011-12-17: qty 42
  Filled 2011-12-17 (×3): qty 1

## 2011-12-17 MED ORDER — CLOBETASOL PROPIONATE 0.05 % EX CREA
TOPICAL_CREAM | Freq: Two times a day (BID) | CUTANEOUS | Status: DC
  Administered 2011-12-17: 1 via TOPICAL
  Administered 2011-12-17: 09:00:00 via TOPICAL
  Administered 2011-12-18: 1 via TOPICAL
  Administered 2011-12-18 – 2011-12-23 (×9): via TOPICAL
  Filled 2011-12-17: qty 15

## 2011-12-17 MED ORDER — AMLODIPINE BESYLATE 5 MG PO TABS
5.0000 mg | ORAL_TABLET | Freq: Every day | ORAL | Status: DC
  Administered 2011-12-17 – 2011-12-23 (×7): 5 mg via ORAL
  Filled 2011-12-17: qty 1
  Filled 2011-12-17: qty 7
  Filled 2011-12-17 (×7): qty 1

## 2011-12-17 MED ORDER — ATENOLOL 50 MG PO TABS
50.0000 mg | ORAL_TABLET | Freq: Two times a day (BID) | ORAL | Status: DC
  Administered 2011-12-17 – 2011-12-18 (×3): 50 mg via ORAL
  Filled 2011-12-17 (×5): qty 1

## 2011-12-17 MED ORDER — SERTRALINE HCL 50 MG PO TABS
50.0000 mg | ORAL_TABLET | Freq: Every day | ORAL | Status: DC
  Administered 2011-12-17 – 2011-12-23 (×7): 50 mg via ORAL
  Filled 2011-12-17 (×8): qty 1

## 2011-12-17 MED ORDER — CLONAZEPAM 1 MG PO TABS: 1.0000 mg | ORAL_TABLET | ORAL | Status: DC

## 2011-12-17 MED ORDER — CLONAZEPAM 1 MG PO TABS
ORAL_TABLET | ORAL | Status: AC
  Administered 2011-12-17: 1 mg via ORAL
  Filled 2011-12-17: qty 1

## 2011-12-17 MED ORDER — CLONAZEPAM 0.5 MG PO TABS
0.2500 mg | ORAL_TABLET | ORAL | Status: DC
  Administered 2011-12-17: 0.25 mg via ORAL
  Administered 2011-12-18: 0.5 mg via ORAL
  Filled 2011-12-17 (×3): qty 1

## 2011-12-17 MED ORDER — NICOTINE 21 MG/24HR TD PT24
21.0000 mg | MEDICATED_PATCH | Freq: Every day | TRANSDERMAL | Status: DC
  Administered 2011-12-17 – 2011-12-23 (×6): 21 mg via TRANSDERMAL
  Filled 2011-12-17 (×10): qty 1

## 2011-12-17 NOTE — Progress Notes (Signed)
In hallway on approach. Appears flat but brightens in conversation. Open and spontaneous in conversation. Has been visible and interacting appropriately with peers. Calm and cooperative with assessment. No acute distress noted. States he has had a good day and is adjusting well to milieu. Recalled events that led to re-admission and the medication changes he tried without success. Asked about medications that were ordered for him this time. Writer reviewed information and pt verbalized understanding. States if they dont work tonight he will discuss it with MD in AM. Support and encouragement provided. Otherwise offered no questions or concerns. Denies SI/HI/AVH and contracts for safety. Denies pain or discomfort. POC for the shift reviewed and understanding verbalized. Safety has been maintained with Q15 minute observation. Will continue current POC.

## 2011-12-17 NOTE — Treatment Plan (Addendum)
Interdisciplinary Treatment Plan Update (Adult)  Date: 12/17/2011  Time Reviewed: 8:24 AM   Progress in Treatment: Attending groups: Yes Participating in groups: Yes Taking medication as prescribed: Yes Tolerating medication: Yes   Family/Significant other contact made:  No, needed this time with wife Patient understands diagnosis:  Yes Discussing patient identified problems/goals with staff:  Yes Medical problems stabilized or resolved:  Yes Denies suicidal/homicidal ideation: Yes Issues/concerns per patient self-inventory:  Yes Other:  New problem(s) identified: N/A  Reason for Continuation of Hospitalization: Medication stabilization  Interventions implemented related to continuation of hospitalization:  Medication monitoring and adjustment, safety checks Q15 min., suicide risk assessment, group therapy, psychoeducation, collateral contact, aftercare planning, ongoing physician assessments, medication education  Additional comments:  N/A  Estimated length of stay:  3-4 days  Discharge Plan:  Return home with wife, follow up with Vesta Mixer, Dr. Arizona Constable goal(s): N/A  Review of initial/current patient goals per problem list:   1.  Goal(s): Decrease anxiety from admission level to no greater than 3 at discharge.  Met:  No  Target date:  As evidenced by:  Today anxiety is at "7"  2.  Goal (s): Substance abuse issues [assessment reports pt states psychosis may have been due to abuse of opiates; UDS positive for such]  Met:  No  Target date:  As evidenced by:  Patient states that he needs to stay away from opiates, including any Tylenol that doctors tell him he can take.  Took Hydrocodone that wife gave him, which she did not offer but he "finagled" from her "like a kid with a candy jar".  However, he went on to reveal reluctantly that wife gets prescriptions for Vicodin ostensibly for herself (but not really) or gets them from her sister, and gives them to him.  States  that even a picture of a Vicodin pill will make him crave it.  He does not want [place] to mention AT ALL to his wife about him taking her Vicodin, and stated he has a right to confidentiality regarding that.  He eventually stated that we should tell his wife that there should be no Vicodin in the house, without revealing that he has told us that she has been getting it for him.   3.  Goal(s): Eliminate psychosis  Met:  No  Target date:  As evidenced by:  Still sees "color changes"  4.  Goal(s):  Medication stabilization  Met:  No  Target date:  As evidenced by:  Ongoing, patient is new.  Patient reports that as soon as he left the hospital, he went to see Dr. Ladona Ridgel at [facility] and was taken off the Klonopin, yet the Klonopin was what changed his confusion in the last hospitalization.  Attendees: Patient:  Khyle Goodell 12/17/2011 11:23 AM   Family:     Physician: Franchot Gallo, MD 12/17/2011 11:23 AM   Nursing:   Izola Price, RN 12/17/2011 11:23 AM   Case Manager:  Richelle Ito, LCSW 12/17/2011 11:23 AM   Counselor:  [name], MT-BC 12/17/2011 11:23 AM   Other:  Verne Spurr, PA 12/17/2011 11:23 AM   Other:  Ambrose Mantle, LCSW 12/17/2011 11:23 AM   Other:     Other:      Scribe for Treatment Team:   Ida Rogue, 12/17/2011 8:24 AM
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
## 2011-12-17 NOTE — Discharge Planning (Signed)
Today [name] admitted to abuse of opiates.  We have a tentative plan of inviting his wife in to treatment team to discuss follow up plan and how she can best support him at d/c.  Peer to peer review today at 1:30.

## 2011-12-17 NOTE — Progress Notes (Signed)
Patient ID: [name], male   DOB: Feb 06, 1961, 51 y.o.   MRN: [id number] He has been up and about and to groups. Interacting with peers and staff. Has not requested any PRN,s. Medication.

## 2011-12-17 NOTE — Progress Notes (Signed)
Adult Psychosocial Assessment Update Interdisciplinary Team  Previous [facility] admissions/discharges:  Admissions Discharges  Date:12/05/11 Date:  Date: Date:  Date: Date:  Date: Date:  Date: Date:   Changes since the last Psychosocial Assessment (including adherence to outpatient mental health and/or substance abuse treatment, situational issues contributing to decompensation and/or relapse). Confusion             Discharge Plan 1. Will you be returning to the same living situation after discharge?   Yes:x No:      If no, what is your plan?    Lives with wife       2. Would you like a referral for services when you are discharged? Yes:  x   If yes, for what services?  No:       [facility]       Summary and Recommendations (to be completed by the evaluator) Patient is a 51 year old white male with diagnosis of Psychotic Disorder and Bipolar Disorder. Patient was readmitted due to confusion.  Patient would benefit from reevaluation of symptoms and medication evaluation.                       Signature:  [name], 12/17/2011 8:47 AM

## 2011-12-17 NOTE — [facility] Suicide Risk Assessment (Signed)
Suicide Risk Assessment  Admission Assessment     Demographic factors:  Assessment Details Time of Assessment: Admission Information Obtained From: Patient Current Mental Status:    Loss Factors:  Loss Factors: Decline in physical health Historical Factors:  Historical Factors: Family history of mental illness or substance abuse Risk Reduction Factors:  Risk Reduction Factors: Sense of responsibility to family;Living with another person, especially a relative;Positive social support  CLINICAL FACTORS:   Severe Anxiety and/or Agitation Bipolar Disorder:   Depressive phase Depression:   Anhedonia Comorbid alcohol abuse/dependence Insomnia Alcohol/Substance Abuse/Dependencies Chronic Pain More than one psychiatric diagnosis Currently Psychotic Previous Psychiatric Diagnoses and Treatments Medical Diagnoses and Treatments/Surgeries  COGNITIVE FEATURES THAT CONTRIBUTE TO RISK:  Polarized thinking    Diagnosis:   AXIS I:  Bipolar I Disorder - Depressed.     Generalized Anxiety Disorder.     Opioid Abuse - Ongoing.    Alcohol Dependence - In Remission x 10 years.   AXIS II:  Deferred.   AXIS III:  1. Tachycardia.     2. Peripheral Neuropathy.     3. Avascular Necrosis.     4. Psoriasis.     5. Hypertension.     6. Hepatitis C.   AXIS IV:  Chronic Mental Illness. Chronic Non-psychiatric medical issues.   AXIS V:  GAF at time of admission approximately 35. GAF at time of discharge approximately 55.   The patient was seen today and reports the following:   ADL's: Intact.  Sleep: The patient reports to having difficulty getting into a restful sleep.  Appetite: The patient reports a decreased appetite today.   Mild>(1-10) >Severe  Hopelessness (1-10): 0  Depression (1-10): 5  Anxiety (1-10): 7   Suicidal Ideation: The patient denies any suicidal ideations today.  Plan: No  Intent: No  Means: No   Homicidal Ideation: The patient denies any homicidal ideations today.    Plan: No  Intent: No.  Means: No   General Appearance/Behavior: The patient remains friendly and cooperative today with this provider.  Eye Contact: Good.  Speech: Appropriate in rate and volume with no pressuring of speech noted today.  Motor Behavior: wnl.  Level of Consciousness: Alert and Oriented x 3.  Mental Status: Alert and Oriented x 3.  Mood: Moderately Depressed.  Affect: Moderately Constricted.  Anxiety Level: Moderate to Severe anxiety reported today.  Thought Process: wnl.  Thought Content: The patient denies any auditory hallucinations or delusional thinking.  He does report to having visual disturbances particularly at night. Perception:. wnl.  Judgment: Fair.  Insight: Fair.  Cognition: Oriented to person, place and time.   Current Medications:  Current Facility-Administered Medications   Medication  Dose  Route  Frequency  Provider  Last Rate  Last Dose   .  acetaminophen (TYLENOL) tablet 650 mg  650 mg  Oral  Q6H PRN  Mickie D. Adams, PA     .  alum & mag hydroxide-simeth (MAALOX/MYLANTA) 200-200-20 MG/5ML suspension 30 mL  30 mL  Oral  Q4H PRN  Mickie D. Adams, PA     .  amLODipine (NORVASC) tablet 5 mg  5 mg  Oral  Daily  Mickie D. Adams, PA     .  atenolol (TENORMIN) tablet 50 mg  50 mg  Oral  BID  Mickie D. Adams, PA     .  clobetasol (TEMOVATE) 0.05 % external solution 1 application  1 application  Topical  BID  Mickie D. Pernell Dupre, PA     .  clonazePAM (KLONOPIN) tablet 1 mg  1 mg  Oral  BID  Mickie D. Adams, PA     .  cloNIDine (CATAPRES) tablet 0.3 mg  0.3 mg  Oral  BID  Mickie D. Adams, PA     .  etanercept (ENBREL) 50 MG/ML injection 25 mg  25 mg  Subcutaneous  Custom  Mickie D. Adams, PA     .  gabapentin (NEURONTIN) capsule 300 mg  300 mg  Oral  TID  Mickie D. Adams, PA     .  lamoTRIgine (LAMICTAL) tablet 150 mg  150 mg  Oral  BH-qamhs  Mickie D. Adams, PA     .  lithium carbonate capsule 600 mg  600 mg  Oral  BID WC  Mickie D. Adams, PA     .   magnesium hydroxide (MILK OF MAGNESIA) suspension 30 mL  30 mL  Oral  Daily PRN  Mickie D. Adams, PA     .  sertraline (ZOLOFT) tablet 50 mg  50 mg  Oral  Daily  Mickie D. Adams, PA     .  traZODone (DESYREL) tablet 50 mg  50 mg  Oral  QHS  Mickie D. Pernell Dupre, PA      Facility-Administered Medications Ordered in Other Encounters   Medication  Dose  Route  Frequency  Provider  Last Rate  Last Dose   .  DISCONTD: 0.9 % sodium chloride infusion   Intravenous  Continuous  Carleene Cooper III, MD  125 mL/hr at 12/15/11 1433  125 mL/hr at 12/15/11 1433   .  DISCONTD: amLODipine (NORVASC) tablet 5 mg  5 mg  Oral  Daily  Nathan R. Pickering, MD   5 mg at 12/16/11 2027   .  DISCONTD: atenolol (TENORMIN) tablet 50 mg  50 mg  Oral  BID  Juliet Rude. Pickering, MD   50 mg at 12/16/11 2026   .  DISCONTD: cloNIDine (CATAPRES) tablet 0.3 mg  0.3 mg  Oral  BID  Juliet Rude. Pickering, MD     .  DISCONTD: etanercept (ENBREL) 50 MG/ML injection 25 mg  25 mg  Subcutaneous  2 times weekly  Harrold Donath R. Pickering, MD     .  DISCONTD: gabapentin (NEURONTIN) capsule 300 mg  300 mg  Oral  TID  Juliet Rude. Pickering, MD     .  DISCONTD: gabapentin (NEURONTIN) capsule 300 mg  300 mg  Oral  To Minor  Carleene Cooper III, MD     .  DISCONTD: ibuprofen (ADVIL,MOTRIN) tablet 600 mg  600 mg  Oral  Q8H PRN  Juliet Rude. Pickering, MD     .  DISCONTD: lamoTRIgine (LAMICTAL) tablet 150 mg  150 mg  Oral  BH-qamhs  Nathan R. Pickering, MD     .  DISCONTD: lithium carbonate capsule 600 mg  600 mg  Oral  BID WC  Nathan R. Pickering, MD   600 mg at 12/16/11 2028   .  DISCONTD: LORazepam (ATIVAN) tablet 1 mg  1 mg  Oral  Q8H PRN  Juliet Rude. Pickering, MD   1 mg at 12/16/11 1328   .  DISCONTD: nicotine (NICODERM CQ - dosed in mg/24 hours) patch 21 mg  21 mg  Transdermal  Once  Dione Booze, MD   21 mg at 12/15/11 2309   .  DISCONTD: ondansetron (ZOFRAN-ODT) disintegrating tablet 8 mg  8 mg  Oral  Q8H PRN  Dione Booze, MD     .  DISCONTD: sertraline (ZOLOFT)  tablet 50 mg  50 mg  Oral  Daily  Juliet Rude. Pickering, MD   50 mg at 12/16/11 2028   .  DISCONTD: traZODone (DESYREL) tablet 100 mg  100 mg  Oral  QHS  Nathan R. Pickering, MD     .  DISCONTD: zolpidem (AMBIEN) tablet 5 mg  5 mg  Oral  QHS PRN  Juliet Rude. Rubin Payor, MD      Review of Systems:  Neurological: No headaches, seizures or dizziness reported.  G.I.: The patient denies any constipation or stomach upset today.  Musculoskeletal: The patient reports significant joint pain today and has a history of degenerative joint disease.   Time was spent today discussing with the patient his current symptoms. The patient reports that he is having difficulty getting into a restful sleep.  He reports a decreased appetite and reports moderate feelings of sadness, anhedonia and depressed mood.  He denies any auditory hallucinations or delusional thinking, but reports some visual disturbances particularly at night.  He describes his anxiety as moderate to severe.  The patient states when he was discharged, his wife removed some of the medications which were prescribed during his hospitalization which she did not feel he needed.  She also reportedly added some other medications not prescribed at discharge she felt he needed.  This included provide Vicodin to the patient who has a narcotic addition.  As a result of these changes, the patient decompensated resulting in readmission.  Treatment Plan Summary:  1. Daily contact with patient to assess and evaluate symptoms and progress in treatment.  2. Medication management  3. The patient will deny suicidal ideations or homicidal ideations for 48 hours prior to discharge and have a depression and anxiety rating of 3 or less. The patient will also deny any auditory or visual hallucinations or delusional thinking.  4. The patient will deny any symptoms of substance withdrawal at time of discharge.   Plan:  1. Will continue the patient on his non-psychiatric  medications.  2. Will restart the medication Trazodone at 50 mgs po qhs for sleep. 3. Will restart the medication Zoloft at 50 mgs po q am for anxiety and depression. 4. Will restart the medication Lamictal at 150 mgs po q am and hs for mood stabilization. 5. Will restart the medication Neurontin at 300 mgs po q am, 2 pm and hs for anxiety and neuropathic pain. 6. Will restart the medication Klonopin at the reduced dosage of 0.25 mgs po q am and hs for anxiety. 7. Laboratory Studies reviewed.  8. Will continue to monitor.   SUICIDE RISK:   Minimal: No identifiable suicidal ideation.  Patients presenting with no risk factors but with morbid ruminations; may be classified as minimal risk based on the severity of the depressive symptoms  [name] 12/17/2011, 1:31 PM

## 2011-12-18 MED ORDER — ETANERCEPT 50 MG/ML ~~LOC~~ SOLN: 25.0000 mg | SUBCUTANEOUS | Status: DC

## 2011-12-18 MED ORDER — ETANERCEPT 25 MG ~~LOC~~ KIT
25.0000 mg | PACK | Freq: Once | SUBCUTANEOUS | Status: AC
  Administered 2011-12-18: 25 mg via SUBCUTANEOUS

## 2011-12-18 MED ORDER — TRAZODONE HCL 100 MG PO TABS
100.0000 mg | ORAL_TABLET | Freq: Every day | ORAL | Status: DC
  Administered 2011-12-18: 100 mg via ORAL
  Filled 2011-12-18 (×2): qty 1

## 2011-12-18 MED ORDER — LITHIUM CARBONATE 300 MG PO CAPS
600.0000 mg | ORAL_CAPSULE | ORAL | Status: DC
  Administered 2011-12-18 – 2011-12-23 (×10): 600 mg via ORAL
  Filled 2011-12-18 (×12): qty 2

## 2011-12-18 MED ORDER — CLONAZEPAM 0.5 MG PO TABS
0.5000 mg | ORAL_TABLET | ORAL | Status: DC
  Administered 2011-12-18 – 2011-12-19 (×4): 0.5 mg via ORAL
  Filled 2011-12-18 (×4): qty 1

## 2011-12-18 MED ORDER — GABAPENTIN 400 MG PO CAPS
400.0000 mg | ORAL_CAPSULE | ORAL | Status: DC
  Administered 2011-12-18 – 2011-12-19 (×4): 400 mg via ORAL
  Filled 2011-12-18 (×7): qty 1

## 2011-12-18 MED ORDER — ATENOLOL 50 MG PO TABS
50.0000 mg | ORAL_TABLET | ORAL | Status: DC
  Administered 2011-12-19 – 2011-12-23 (×9): 50 mg via ORAL
  Filled 2011-12-18: qty 14
  Filled 2011-12-18 (×12): qty 1

## 2011-12-18 MED ORDER — TRAZODONE HCL 50 MG PO TABS
50.0000 mg | ORAL_TABLET | Freq: Every day | ORAL | Status: DC
  Administered 2011-12-19: 50 mg via ORAL
  Filled 2011-12-18 (×2): qty 1

## 2011-12-18 MED ORDER — ETANERCEPT 25 MG ~~LOC~~ KIT: 25.0000 mg | PACK | Freq: Once | SUBCUTANEOUS | Status: DC

## 2011-12-18 NOTE — Progress Notes (Signed)
Adult Services Patient-Family Contact/Session  Attendees:  Patient's wife, [name] [phone])  Goal(s):  Inform of meeting with treatment team  Safety Concerns:  None  Narrative:  Informed wife of meeting with treatment team on Friday, 5/17 at 10:30. Told her that meeting would be to talk about expectations and discharge planning and to get everyone on the same page. Asked her about her actions with medications when he was discharged. She volunteered that she had given him several Vicodin because he was in so much pain. She stated that when she picked him up on Tuesday, he was no better and he looked "doped up". She was questioned why she did not express concerns at the time. Also questioned if he looked like this why she would give him additional pain medications. She stated that since he was already like this she didn't think it would hurt him. When she took him to his appointment on Thursday, the doctor completely took him off Klonopin and that is why she did not give him this. Reported to her that he was stable when counselor last observed on the day of discharge. Talked about medications given that were not prescribed and taking away medications that had made him stable would have contributed to his confusion and instability. Educated her on the negative effects of pain medications. She was appreciative and wanted to meet with the team.  Barrier(s):  Lack of insight and enabling  Interventions:  Education,   Recommendation(s):  Wife to attend treatment team and for clear expectations to be set.  Follow-up Required:  No  Explanation:    [name] 12/18/2011, 3:11 PM

## 2011-12-18 NOTE — Progress Notes (Signed)
12/18/2011         Time: 0930      Group Topic/Focus: The focus of this group is on discussing various styles of communication and communicating assertively using 'I' (feeling) statements.  Participation Level: Active  Participation Quality: Attentive and Supportive  Affect: Blunted  Cognitive: Oriented  Additional Comments: None.  Clayden Withem 12/18/2011 11:24 AM

## 2011-12-18 NOTE — Progress Notes (Signed)
Patient appeared pleasant and cooperative. Received his medications as ordered. Wife brought in his injection, pharmacist noted about the injection since the order was written for yesterday. Shanda Bumps [facility] changed the date  Wednesday and Saturday because there has to be 72 hours interval between administration of the injection. Patient denied SI/Hi and denied hallucinations. Q 15 minute check continues to maintain safety.

## 2011-12-18 NOTE — Progress Notes (Signed)
[facility] Group Notes:  (Counselor/Nursing/MHT/Case Management/Adjunct)  12/18/2011 3:08 PM  Type of Therapy:  Group Therapy  Participation Level:  Did not attend  [name] 12/18/2011, 3:08 PM

## 2011-12-18 NOTE — Progress Notes (Signed)
Pt states he sleeps poorly, appetite improving, energy level high. Pt still admits to some anxiety, his dose Klonopin increased today. Pt's family to bring in Enbrel tonight for psoriasis. Personable, denies SI/HI.

## 2011-12-18 NOTE — Progress Notes (Signed)
[facility] Group Notes:  (Counselor/Nursing/MHT/Case Management/Adjunct)  12/18/2011 3:09 PM  Type of Therapy:  Psychoeducational Skills  Participation Level:  Active  Participation Quality:  Attentive and Sharing  Affect:  Blunted  Cognitive:  Oriented  Insight:  Good  Engagement in Group:  Good  Engagement in Therapy:  Good  Modes of Intervention:  Education and Support  Summary of Progress/Problems: Patient was active in listening to speaker from mental health association. He asked relative questions. Talked about negative experiences with support groups but was willing to look at others. He later expressed how the speaker had increased his hope because of his personal story.   Deepa Barthel, Aram Beecham 12/18/2011, 3:09 PM

## 2011-12-18 NOTE — Progress Notes (Signed)
[facility] Group Notes:  (Counselor/Nursing/MHT/Case Management/Adjunct)  12/18/2011 3:07 PM  Type of Therapy:  Group Therapy 12/17/11  Participation Level:  Active  Participation Quality:  Attentive and Sharing  Affect:  Depressed  Cognitive:  Oriented  Insight:  Limited  Engagement in Group:  Good  Engagement in Therapy:  Good  Modes of Intervention:  Clarification, Education, Problem-solving and Support  Summary of Progress/Problems: Patient was active in discussion of communication with family members. He talked about conflict with wife but it usually ends with kissing and making up. Did not identify any dysfunction. Patient was in and out of group due to staff meeting with him.   [name], Aram Beecham 12/18/2011, 3:07 PM

## 2011-12-18 NOTE — Progress Notes (Signed)
[facility] MD Progress Note  12/18/2011 2:01 PM  Diagnosis:  Axis I: Bipolar I Disorder - Depressed.  Generalized Anxiety Disorder.  Alcohol Dependence - In Remission x 10 years.   The patient was seen today and reports the following:   ADL's: Intact.  Sleep: The patient reports to having difficulty initiating and maintaining sleep. Appetite: The patient reports an ongoing good appetite today.   Mild>(1-10) >Severe  Hopelessness (1-10): 0  Depression (1-10): 3-4  Anxiety (1-10): 9  Suicidal Ideation: The patient adamantly denies any suicidal ideations today.  Plan: No  Intent: No  Means: No   Homicidal Ideation: The patient adamantly denies any homicidal ideations today.  Plan: No  Intent: No.  Means: No   General Appearance/Behavior: The patient remains friendly and cooperative today with this provider.  Eye Contact: Good.  Speech: Appropriate in rate and volume with no pressuring of speech noted today.  Motor Behavior: wnl.  Level of Consciousness: Alert and Oriented x 3.  Mental Status: Alert and Oriented x 3.  Mood: Mild to moderately Depressed.  Affect: Mild to moderately Constricted.  Anxiety Level: Severe anxiety reported today.  Thought Process: wnl.  Thought Content: The patient denies any current auditory or visual hallucinations or delusional thinking.  He does report to seeing "sparks" at times particularly at night.  Perception:. wnl.  Judgment: Good.  Insight: Good.  Cognition: Oriented to person, place and time.  Sleep:  Number of Hours: 6.5    Vital Signs:Blood pressure 108/71, pulse 62, temperature 97.9 F (36.6 C), temperature source Oral, resp. rate 18, height 5\' 11"  (1.803 m), weight 65.318 kg (144 lb).  Current Medications: Current Facility-Administered Medications  Medication Dose Route Frequency Provider Last Rate Last Dose  . acetaminophen (TYLENOL) tablet 650 mg  650 mg Oral Q6H PRN Mickie D. Adams, PA      . alum & mag hydroxide-simeth  (MAALOX/MYLANTA) 200-200-20 MG/5ML suspension 30 mL  30 mL Oral Q4H PRN Mickie D. Adams, PA      . amLODipine (NORVASC) tablet 5 mg  5 mg Oral Daily Curlene Labrum Jarreau Callanan, MD   5 mg at 12/18/11 0804  . atenolol (TENORMIN) tablet 50 mg  50 mg Oral BH-qamhs Leola Fiore D Lavern Crimi, MD      . clobetasol cream (TEMOVATE) 0.05 %   Topical BID Ronny Bacon, MD   1 application at 12/18/11 0802  . clonazePAM (KLONOPIN) tablet 0.5 mg  0.5 mg Oral BH-q8a2phs Kaydence Baba D Betty Brooks, MD      . cloNIDine (CATAPRES) tablet 0.3 mg  0.3 mg Oral BH-qamhs Curlene Labrum Miroslav Gin, MD   0.3 mg at 12/18/11 0803  . etanercept (ENBREL) 50 MG/ML injection 25 mg  25 mg Subcutaneous Custom Mickie D. Adams, PA      . gabapentin (NEURONTIN) capsule 400 mg  400 mg Oral BH-q8a2phs Tearia Gibbs D Natahlia Hoggard, MD      . lamoTRIgine (LAMICTAL) tablet 150 mg  150 mg Oral BH-qamhs Curlene Labrum Croy Drumwright, MD   150 mg at 12/18/11 0802  . lithium carbonate capsule 600 mg  600 mg Oral BH-qamhs Milaina Sher D Reid Nawrot, MD      . magnesium hydroxide (MILK OF MAGNESIA) suspension 30 mL  30 mL Oral Daily PRN Mickie D. Adams, PA      . nicotine (NICODERM CQ - dosed in mg/24 hours) patch 21 mg  21 mg Transdermal Daily Curlene Labrum Anyiah Coverdale, MD   21 mg at 12/18/11 0801  . sertraline (ZOLOFT) tablet 50 mg  50  mg Oral Daily Curlene Labrum Otis Burress, MD   50 mg at 12/18/11 0804  . traZODone (DESYREL) tablet 100 mg  100 mg Oral QHS Curlene Labrum Refugio Vandevoorde, MD      . traZODone (DESYREL) tablet 50 mg  50 mg Oral Q breakfast Curlene Labrum Angelize Ryce, MD      . DISCONTD: atenolol (TENORMIN) tablet 50 mg  50 mg Oral BID Curlene Labrum Sheila Gervasi, MD   50 mg at 12/18/11 0804  . DISCONTD: clonazePAM (KLONOPIN) tablet 0.25 mg  0.25 mg Oral BH-qamhs Curlene Labrum Katheen Aslin, MD   0.5 mg at 12/18/11 0803  . DISCONTD: clonazePAM (KLONOPIN) tablet 1 mg  1 mg Oral BH-qamhs Chatham Howington D Gwenlyn Hottinger, MD      . DISCONTD: gabapentin (NEURONTIN) capsule 300 mg  300 mg Oral BH-q8a2phs Curlene Labrum Rooney Gladwin, MD   300 mg at 12/18/11 0805  . DISCONTD: lithium  carbonate capsule 600 mg  600 mg Oral BID WC Mickie D. Adams, PA   600 mg at 12/18/11 0811  . DISCONTD: traZODone (DESYREL) tablet 50 mg  50 mg Oral QHS Mickie D. Adams, PA   50 mg at 12/17/11 2150   Lab Results:  No results found for this or any previous visit (from the past 48 hour(s)).  Review of Systems:  Neurological: No headaches, seizures or dizziness reported.  G.I.: The patient denies any constipation or stomach upset today.  Musculoskeletal: The patient reports ongoing significant joint pain today and has a history of degenerative joint disease.   Time was spent today discussing with the patient his current symptoms. The patient reports to having difficulty initiating and maintaining sleep.  He reports mild to moderate depression and severe anxiety today.  He denies any SI/HI.  He also denies any auditory or visual hallucinations or delusional thinking.  The patient also states today that his wife had given him "8 Vicodin" just prior to his readmission which resulted in his confusion and mental status changes.  He states he would like the treatment team to meet with his wife to discuss the need for her not to bring narcotics into the home.  Treatment Plan Summary:  1. Daily contact with patient to assess and evaluate symptoms and progress in treatment.  2. Medication management  3. The patient will deny suicidal ideations or homicidal ideations for 48 hours prior to discharge and have a depression and anxiety rating of 3 or less. The patient will also deny any auditory or visual hallucinations or delusional thinking.  4. The patient will deny any symptoms of substance withdrawal at time of discharge.   Plan:  1. Will continue the patient on his non-psychiatric medications.  2. Will increase the medication Trazodone to 50 mgs po q am and 100 mgs po qhs for sleep.  3. Will continue the medication Zoloft at 50 mgs po q am for anxiety and depression.  4. Will increase the medication  Lamictal at 150 mgs po q am and hs for mood stabilization.  5. Will increase the medication Neurontin to 400 mgs po q am, 2 pm and hs for anxiety and neuropathic pain.  6. Will increase the medication Klonopin to 0.5  mgs po q am, 2 pm and hs for anxiety.  7. Laboratory Studies reviewed.  8. Will continue to monitor.  9. Case Manager will arrange for the patient's spouse to come to out Team Meeting to discuss patient concerns.  Georgio Hattabaugh 12/18/2011, 2:01 PM

## 2011-12-18 NOTE — Discharge Planning (Signed)
Met with patient in Aftercare Planning Group.   He stated he has spoken with his wife about giving him the Vicodin, and has told her she needs to get it out of the house.  He said that she believes that he has not mentioned this to staff here.  He would like her come in for a treatment team meeting to discuss medications at discharge.  Counselor is arranging this.  Ambrose Mantle, LCSW 12/18/2011, 11:18 AM

## 2011-12-19 DIAGNOSIS — F411 Generalized anxiety disorder: Secondary | ICD-10-CM

## 2011-12-19 DIAGNOSIS — F1021 Alcohol dependence, in remission: Secondary | ICD-10-CM

## 2011-12-19 DIAGNOSIS — F3132 Bipolar disorder, current episode depressed, moderate: Secondary | ICD-10-CM

## 2011-12-19 MED ORDER — TRAZODONE HCL 100 MG PO TABS
100.0000 mg | ORAL_TABLET | Freq: Every day | ORAL | Status: DC
  Administered 2011-12-20 – 2011-12-23 (×4): 100 mg via ORAL
  Filled 2011-12-19 (×6): qty 1

## 2011-12-19 MED ORDER — CLONAZEPAM 0.5 MG PO TABS
0.5000 mg | ORAL_TABLET | Freq: Two times a day (BID) | ORAL | Status: DC
  Administered 2011-12-20: 0.5 mg via ORAL
  Filled 2011-12-19: qty 1

## 2011-12-19 MED ORDER — TRAZODONE HCL 150 MG PO TABS
150.0000 mg | ORAL_TABLET | Freq: Every day | ORAL | Status: DC
  Administered 2011-12-19: 150 mg via ORAL
  Filled 2011-12-19 (×2): qty 1

## 2011-12-19 MED ORDER — GABAPENTIN 300 MG PO CAPS
600.0000 mg | ORAL_CAPSULE | ORAL | Status: DC
  Administered 2011-12-19 – 2011-12-20 (×3): 600 mg via ORAL
  Filled 2011-12-19 (×9): qty 2

## 2011-12-19 NOTE — Progress Notes (Signed)
Patient up and active in the milieu today.  Has been attending groups.  He is more neatly groomed today than he has been in the past.  He has been pleasant and cooperative.  Has been smiling and joking more with staff and peers.  Tolerating medications well.  Denies suicidal ideation at this time.

## 2011-12-19 NOTE — Progress Notes (Signed)
[facility] Group Notes:  (Counselor/Nursing/MHT/Case Management/Adjunct)  12/19/2011 4:20 PM  Type of Therapy:  Group Therapy  Participation Level:  Active  Participation Quality:  Appropriate  Affect:  Appropriate  Cognitive:  Appropriate  Insight:  Good  Engagement in Group:  Good  Engagement in Therapy:  Good  Modes of Intervention:  Clarification, Education and Support  Summary of Progress/Problems: Reported feeling better and peers commented on how much better he looks. Talked about how to stay busy and continue with therapy on the outside. Eager for wife to join treatment team on Friday.   Lorry Anastasi, Aram Beecham 12/19/2011, 4:20 PM

## 2011-12-19 NOTE — Progress Notes (Signed)
Pt is anxious and intrusive  He continues to complain frequently about not sleeping well and was upset because the doctor decreased his clonazepam dose   Pt is generally cooperative and pleasant  He interacts well with others   He attended karoke group and said he enjoyed same   Pt denies suicidal and homicidal ideation  He said he is hyper and cannot settle down   verbasl support given  Medications administered and effectiveness monitored  Q 15 min checks  Pt safe at present

## 2011-12-19 NOTE — Progress Notes (Signed)
Lying quietly in bed with eyes closed.  Q 15 minute checks are being conducted to maintain safety. 

## 2011-12-19 NOTE — Discharge Planning (Signed)
Today in AM group [name] revealed that he and his wife have been speaking by phone, and she feels he is doing well.  This makes him happy.  At the same time, he expresses anxiety about the meeting tomorrow with the tx team that his wife is coming to.

## 2011-12-19 NOTE — Progress Notes (Signed)
Patient ID: [name], male   DOB: 1961/03/12, 51 y.o.   MRN: [id number] [facility] MD Progress Note  12/19/2011 Diagnosis:  Axis I: Bipolar I Disorder - Depressed.                                 Generalized Anxiety Disorder.                                 Alcohol Dependence - In Remission x 10 years.   The patient was seen today and reports the following:   ADL's: Good Sleep: Poor per the patient who notes constantly waking up with poor quality of sleep Appetite: The patient reports an ongoing good appetite today.   Mild>(1-10) >Severe  Hopelessness (1-10): 0  Depression (1-10): 3 Anxiety (1-10): 8 The patient relates this to feeling "hyper" or "manicy"  Suicidal Ideation: The patient adamantly denies any suicidal ideations today.  Plan: No  Intent: No  Means: No   Homicidal Ideation: The patient adamantly denies any homicidal ideations today.  Plan: No  Intent: No.  Means: No   General Appearance/Behavior: The patient remains friendly and cooperative today with this provider.  Eye Contact: Good.  Speech: Appropriate in rate and volume with no pressuring of speech noted today.  Motor Behavior: wnl.  Level of Consciousness: Alert  Mental Status: Oriented x 3.  Mood: Mildly Depressed.  Affect: congruent Anxiety Level: moderate anxiety reported today.  Thought Process: wnl.  Thought Content: The patient denies any current auditory or visual hallucinations or delusional thinking.   Perception:. wnl.  Judgment: Good.  Insight: Good.  Cognition: Oriented to person, place and time.  Sleep:  Number of Hours: 6.5    Vital Signs: 135/86, Resp: 16, Pulse 53, Temp: 97.8   Current Medications: Current Facility-Administered Medications  Medication Dose Route Frequency Provider Last Rate Last Dose  . acetaminophen (TYLENOL) tablet 650 mg  650 mg Oral Q6H PRN Mickie D. Adams, PA      . alum & mag hydroxide-simeth (MAALOX/MYLANTA) 200-200-20 MG/5ML suspension 30 mL  30 mL Oral Q4H  PRN Mickie D. Adams, PA      . amLODipine (NORVASC) tablet 5 mg  5 mg Oral Daily Curlene Labrum Readling, MD   5 mg at 12/18/11 0804  . atenolol (TENORMIN) tablet 50 mg  50 mg Oral BH-qamhs Randy D Readling, MD      . clobetasol cream (TEMOVATE) 0.05 %   Topical BID Ronny Bacon, MD   1 application at 12/18/11 0802  . clonazePAM (KLONOPIN) tablet 0.5 mg  0.5 mg Oral BH-q8a2phs Randy D Readling, MD      . cloNIDine (CATAPRES) tablet 0.3 mg  0.3 mg Oral BH-qamhs Curlene Labrum Readling, MD   0.3 mg at 12/18/11 0803  . etanercept (ENBREL) 50 MG/ML injection 25 mg  25 mg Subcutaneous Custom Mickie D. Adams, PA      . gabapentin (NEURONTIN) capsule 400 mg  400 mg Oral BH-q8a2phs Randy D Readling, MD      . lamoTRIgine (LAMICTAL) tablet 150 mg  150 mg Oral BH-qamhs Curlene Labrum Readling, MD   150 mg at 12/18/11 0802  . lithium carbonate capsule 600 mg  600 mg Oral BH-qamhs Randy D Readling, MD      . magnesium hydroxide (MILK OF MAGNESIA) suspension 30 mL  30 mL Oral Daily PRN  Mickie D. Adams, PA      . nicotine (NICODERM CQ - dosed in mg/24 hours) patch 21 mg  21 mg Transdermal Daily Curlene Labrum Readling, MD   21 mg at 12/18/11 0801  . sertraline (ZOLOFT) tablet 50 mg  50 mg Oral Daily Curlene Labrum Readling, MD   50 mg at 12/18/11 0804  . traZODone (DESYREL) tablet 100 mg  100 mg Oral QHS Curlene Labrum Readling, MD      . traZODone (DESYREL) tablet 50 mg  50 mg Oral Q breakfast Curlene Labrum Readling, MD      . DISCONTD: atenolol (TENORMIN) tablet 50 mg  50 mg Oral BID Curlene Labrum Readling, MD   50 mg at 12/18/11 0804  . DISCONTD: clonazePAM (KLONOPIN) tablet 0.25 mg  0.25 mg Oral BH-qamhs Curlene Labrum Readling, MD   0.5 mg at 12/18/11 0803  . DISCONTD: clonazePAM (KLONOPIN) tablet 1 mg  1 mg Oral BH-qamhs Randy D Readling, MD      . DISCONTD: gabapentin (NEURONTIN) capsule 300 mg  300 mg Oral BH-q8a2phs Curlene Labrum Readling, MD   300 mg at 12/18/11 0805  . DISCONTD: lithium carbonate capsule 600 mg  600 mg Oral BID WC Mickie D. Adams, PA   600 mg at  12/18/11 0811  . DISCONTD: traZODone (DESYREL) tablet 50 mg  50 mg Oral QHS Mickie D. Adams, PA   50 mg at 12/17/11 2150   Lab Results:  No results found for this or any previous visit (from the past 48 hour(s).  Review of Systems:  Neurological: No headaches, seizures or dizziness reported.  G.I.: The patient denies any constipation or stomach upset today.  Musculoskeletal: The patient reports ongoing significant joint pain today and has a history of degenerative joint disease.   Time was spent today discussing with the patient his current symptoms. He is requesting that his Klonopin be increased by 1/2 mg as that seems to help more than the Trazodone.  He is still having some racing thoughts and is anxious about his wife's visit with the treatment team tomorrow. He states she is "mortified" that she gave him 8 Vicodin prior to his admission.  He is worried that she will get into trouble.  He states that she gave it to him because he was after her to stop drinking so much.    Treatment Plan Summary:  1. Daily contact with patient to assess and evaluate symptoms and progress in treatment.  2. Medication management  3. The patient will deny suicidal ideations or homicidal ideations for 48 hours prior to discharge and have a depression and anxiety rating of 3 or less. The patient will also deny any auditory or visual hallucinations or delusional thinking.  4. The patient will deny any symptoms of substance withdrawal at time of discharge.   Plan:  1. Will continue the patient on his non-psychiatric medications.  2. Will continue Trazodone to 50 mgs po q am and 100 mgs po qhs for sleep.  3. Will continue the medication Zoloft at 50 mgs po q am for anxiety and depression.  4. Will continue the  Lamictal at 150 mgs po q am and hs for mood stabilization.  5. Will increase the medication Neurontin to 600 mgs po q am, 2 pm and hs for anxiety and neuropathic pain.  6. Will decrease the Klonopin to  0.5 mgs po BID with plans to discontinue it. 7. Laboratory Studies reviewed.  8. Will continue to monitor.  9. Case  Manager will arrange for the patient's spouse to come to out Team Meeting to discuss patient concerns.  Rona Ravens. Nero Sawatzky PAC 12/18/2011, 2:01 PM

## 2011-12-20 MED ORDER — GABAPENTIN 800 MG PO TABS
800.0000 mg | ORAL_TABLET | ORAL | Status: DC
  Administered 2011-12-20 – 2011-12-23 (×9): 800 mg via ORAL
  Filled 2011-12-20 (×7): qty 1
  Filled 2011-12-20: qty 42
  Filled 2011-12-20 (×4): qty 1

## 2011-12-20 MED ORDER — TRAZODONE HCL 100 MG PO TABS
200.0000 mg | ORAL_TABLET | Freq: Every day | ORAL | Status: DC
  Administered 2011-12-20 – 2011-12-22 (×3): 200 mg via ORAL
  Filled 2011-12-20 (×4): qty 2

## 2011-12-20 MED ORDER — CLONAZEPAM 0.5 MG PO TABS
0.2500 mg | ORAL_TABLET | ORAL | Status: DC
  Administered 2011-12-20 – 2011-12-23 (×6): 0.25 mg via ORAL
  Filled 2011-12-20 (×6): qty 1

## 2011-12-20 MED ORDER — CLONAZEPAM 0.5 MG PO TABS: 0.2500 mg | ORAL_TABLET | Freq: Two times a day (BID) | ORAL | Status: DC

## 2011-12-20 NOTE — Progress Notes (Signed)
12/20/2011  Time: 0930   Group Topic/Focus: The focus of the group is on enhancing the patients' ability to cope with stressors by understanding what coping is, why it is important, the negative effects of stress and developing healthier coping skills. Patients practice [place] and discuss how exercise can be used as a healthy coping strategy.   Participation Level:  Minimal  Participation Quality:  Attentive  Affect:  Blunted  Cognitive:  Oriented   Additional Comments: Patient late to group, pleasant and cooperative. Patient appeared hyperverbal at times, but was easily redirected. Patient talked about his drug use and how he is disappointed he won't be discharged today, but respects the doctor's opinion.   Rosmery Duggin  12/20/2011 11:32 AM

## 2011-12-20 NOTE — Progress Notes (Signed)
Pt. Denies SI/HI and denies A/V hallucinations.  Pt. Reports that "I am ready to go home and get my life going again with my wife.  Encouragement and support given.  Pt. Receptive.

## 2011-12-20 NOTE — Progress Notes (Signed)
Pt. Appears in good spirits. Wife attended team meeting and will come back and have supper with the pt. Denies SI and HI and contracts for safety. Pt. Is in the mileu and visiting with the other pts. Remains cooperative.

## 2011-12-20 NOTE — Progress Notes (Signed)
[facility] Group Notes:  (Counselor/Nursing/MHT/Case Management/Adjunct)  12/20/2011 12:22 PM  Type of Therapy:  Group Therapy  Participation Level:  Active  Participation Quality:  Attentive and Sharing  Affect:  Appropriate  Cognitive:  Oriented  Insight:  Good  Engagement in Group:  Good  Engagement in Therapy:  Good  Modes of Intervention:  Clarification, Education, Problem-solving and Support  Summary of Progress/Problems: Patient talked about wanting to get busy taking care of his yard, getting back to cooking which he enjoys and other activities around the house. It was recommended in treatment team that he attend mental health association wellness groups but he wants to put the other things first. Stressed to him the importance of getting out, being with people, socializing and being accountable.    Michalina Calbert, Aram Beecham 12/20/2011, 12:22 PM

## 2011-12-20 NOTE — Treatment Plan (Signed)
Interdisciplinary Treatment Plan Update (Adult)  Date: 12/20/2011  Time Reviewed: 8:27 AM   Progress in Treatment: Attending groups: Yes Participating in groups: Yes Taking medication as prescribed: Yes Tolerating medication: Yes   Family/Significant other contact made:  Yes Patient understands diagnosis:  Yes   Discussing patient identified problems/goals with staff:  Yes Medical problems stabilized or resolved:  Yes Denies suicidal/homicidal ideation: Yes  In tx team Issues/concerns per patient self-inventory:  Yes  Depression 5  Pain at 7  Energy rating is "hyper" Other:  New problem(s) identified: N/A  Reason for Continuation of Hospitalization: Medication stabilization  Interventions implemented related to continuation of hospitalization:  Titrate klonopin down to 0,  Tweak trazodone  Encourage group attendance and participation  Additional comments:  Estimated length of stay:2 days  Discharge Plan: Return home  Follow up outpt.  New goal(s): N/A  Review of initial/current patient goals per problem list:   1.  Goal(s): Decrease anxiety to 3 or less  Met:  No  Target date:5/20  As evidenced by: Self report  2.  Goal (s): Address substance abuse issues  Met:  Yes  Target date:5/17  As evidenced ZO:[id number] agrees to keep controlled substances at sister's house   3.  Goal(s): Eliminate psychosis  Met:  Yes  Target date:5/17 As evidenced [id number] denies psychosis today  4.  Goal(s):Stabilize mood through the use of medication, coping skills  Met:  No  Target date:5/20  As evidenced JY:NWGN report of decreased anxiety, decreased racing thoughts  Attendees: Patient: [name] 12/20/2011 8:27 AM  Family:  Coty Larsh 12/20/2011 8:27 AM  Physician:  Harvie Heck Readling 12/20/2011 8:27 AM   Nursing:  Rodman Key  12/20/2011 8:27 AM   Case Manager:  Richelle Ito, LCSW 12/20/2011 8:27 AM   Counselor: [name] 12/20/2011 8:27 AM   Other:     Other:       Other:     Other:      Scribe for Treatment Team:   Ida Rogue, 12/20/2011 8:27 AM

## 2011-12-20 NOTE — Progress Notes (Signed)
[facility] MD Progress Note  12/20/2011 3:20 PM  Diagnosis:  Axis I: Bipolar I Disorder - Depressed.  Generalized Anxiety Disorder.  Alcohol Dependence - In Remission x 10 years.   The patient was seen today and reports the following:   ADL's: Intact.  Sleep: The patient reports to having some mild difficulty sleeping since increasing the medication Trazodone.  The patient states he feels he may sleep very well if we increased the medication to 200 mgs at bedtime. Appetite: The patient reports an ongoing good appetite today.   Mild>(1-10) >Severe  Hopelessness (1-10): 0  Depression (1-10): 3  Anxiety (1-10): 6-8  Suicidal Ideation: The patient adamantly denies any suicidal ideations today.  Plan: No  Intent: No  Means: No   Homicidal Ideation: The patient adamantly denies any homicidal ideations today.  Plan: No  Intent: No.  Means: No   General Appearance/Behavior: The patient remains friendly and cooperative today with this provider.  Eye Contact: Good.  Speech: Appropriate in rate and volume with no pressuring of speech noted today.  Motor Behavior: wnl.  Level of Consciousness: Alert and Oriented x 3.  Mental Status: Alert and Oriented x 3.  Mood: Mildly Depressed.  Affect: Mildly Constricted.  Anxiety Level: Moderate to severe anxiety reported today.  Thought Process: wnl.  Thought Content: The patient denies any current auditory or visual hallucinations or delusional thinking.   Perception:. wnl.  Judgment: Good.  Insight: Good.  Cognition: Oriented to person, place and time.  Sleep:  Number of Hours: 6.75    Vital Signs:Blood pressure 111/73, pulse 67, temperature 97.9 F (36.6 C), temperature source Oral, resp. rate 18, height 5\' 11"  (1.803 m), weight 65.318 kg (144 lb).  Current Medications: Current Facility-Administered Medications  Medication Dose Route Frequency Provider Last Rate Last Dose  . acetaminophen (TYLENOL) tablet 650 mg  650 mg Oral Q6H PRN Mickie  D. Adams, PA      . alum & mag hydroxide-simeth (MAALOX/MYLANTA) 200-200-20 MG/5ML suspension 30 mL  30 mL Oral Q4H PRN Mickie D. Adams, PA      . amLODipine (NORVASC) tablet 5 mg  5 mg Oral Daily Curlene Labrum Kenley Troop, MD   5 mg at 12/20/11 0828  . atenolol (TENORMIN) tablet 50 mg  50 mg Oral BH-qamhs Curlene Labrum Katianna Mcclenney, MD   50 mg at 12/20/11 0827  . clobetasol cream (TEMOVATE) 0.05 %   Topical BID Curlene Labrum Treyana Sturgell, MD      . clonazePAM (KLONOPIN) tablet 0.25 mg  0.25 mg Oral BID Curlene Labrum Sandra Tellefsen, MD      . cloNIDine (CATAPRES) tablet 0.3 mg  0.3 mg Oral BH-qamhs Curlene Labrum Tirso Laws, MD   0.3 mg at 12/20/11 0827  . etanercept (ENBREL) 25 MG injection KIT 25 mg  25 mg Subcutaneous Once Curlene Labrum Shelie Lansing, MD      . etanercept (ENBREL) 50 MG/ML injection 25 mg  25 mg Subcutaneous Custom Armaan Pond D Shawnika Pepin, MD      . gabapentin (NEURONTIN) tablet 800 mg  800 mg Oral BH-q8a2phs Evone Arseneau D Brice Kossman, MD      . lamoTRIgine (LAMICTAL) tablet 150 mg  150 mg Oral BH-qamhs Curlene Labrum Arlene Brickel, MD   150 mg at 12/20/11 0828  . lithium carbonate capsule 600 mg  600 mg Oral BH-qamhs Curlene Labrum Taunja Brickner, MD   600 mg at 12/20/11 0828  . magnesium hydroxide (MILK OF MAGNESIA) suspension 30 mL  30 mL Oral Daily PRN Mickie D. Pernell Dupre, PA      .  nicotine (NICODERM CQ - dosed in mg/24 hours) patch 21 mg  21 mg Transdermal Daily Curlene Labrum Alden Feagan, MD   21 mg at 12/20/11 1324  . sertraline (ZOLOFT) tablet 50 mg  50 mg Oral Daily Curlene Labrum Milayna Rotenberg, MD   50 mg at 12/20/11 0828  . traZODone (DESYREL) tablet 100 mg  100 mg Oral Q breakfast Verne Spurr, PA-C   100 mg at 12/20/11 0998  . traZODone (DESYREL) tablet 200 mg  200 mg Oral QHS Verne Spurr, PA-C      . DISCONTD: clonazePAM Scarlette Calico) tablet 0.5 mg  0.5 mg Oral BID Verne Spurr, PA-C   0.5 mg at 12/20/11 0827  . DISCONTD: gabapentin (NEURONTIN) capsule 600 mg  600 mg Oral BH-q8a2phs Neil Mashburn, PA-C   600 mg at 12/20/11 1359  . DISCONTD: traZODone (DESYREL) tablet 150 mg  150 mg Oral  QHS Verne Spurr, PA-C   150 mg at 12/19/11 2130   Lab Results:  No results found for this or any previous visit (from the past 48 hour(s)).  Review of Systems:  Neurological: No headaches, seizures or dizziness reported.  G.I.: The patient denies any constipation or stomach upset today.  Musculoskeletal: The patient reports ongoing joint pain today and has a history of degenerative joint disease.   Time was spent today discussing with the patient his current symptoms. The patient reports to having mild difficulty initiating and maintaining sleep which has improved with the increase in Trazodone.  The patient reports a good appetite and reports mild feelings of sadness, anhedonia and depressed mood.  He adamantly denies any suicidal or homicidal ideations as well as any auditory or visual hallucinations or delusional thinking.  The patient reports moderate to severe anxiety but states he feels this is more of a "hyperactive feelings." He states he feels he would spend money if discharged today.   Treatment Plan Summary:  1. Daily contact with patient to assess and evaluate symptoms and progress in treatment.  2. Medication management  3. The patient will deny suicidal ideations or homicidal ideations for 48 hours prior to discharge and have a depression and anxiety rating of 3 or less. The patient will also deny any auditory or visual hallucinations or delusional thinking.  4. The patient will deny any symptoms of substance withdrawal at time of discharge.   Plan:  1. Will continue the patient on his non-psychiatric medications.  2. Will increase the medication Trazodone to 100 mgs po q am and 200 mgs po qhs for sleep.  3. Will continue the medication Zoloft at 50 mgs po q am for anxiety and depression.  4. Will continue the medication Lamictal at 150 mgs po q am and hs for mood stabilization.  5. Will increase the medication Neurontin to 800 mgs po q am, 2 pm and hs for anxiety and  neuropathic pain.  6. Will decrease the medication Klonopin to 0.25  mgs po q am and hs for anxiety with plans to continue to taper and discontinue this medication.  7. Laboratory Studies reviewed.  8. Will continue to monitor.  9. Possible discharge on Monday.  Anica Alcaraz 12/20/2011, 3:20 PM

## 2011-12-20 NOTE — Progress Notes (Signed)
Pt. Returned from outdoors and began to act bizarre. 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 Earlier he told the nurse that most pharmacist steal drugs especially narcoitcs and that is is very important to count your medicines before you leave the pharmacy.

## 2011-12-20 NOTE — Progress Notes (Signed)
Pt. States he slept really good last night and feels good today. Denies SI or HI and contracts for safety. Good eye contact. Denies hearing any voices or any visual hallucinations. Pt.has been in group and is very cooperative. Pt. States so far he is having a good day.

## 2011-12-21 MED ORDER — ETANERCEPT 25 MG ~~LOC~~ KIT: 25.0000 mg | PACK | Freq: Once | SUBCUTANEOUS | Status: DC

## 2011-12-21 NOTE — Progress Notes (Signed)
Pt. Is resting quietly/ No signs of distress or discomfort noted at this time. 

## 2011-12-21 NOTE — Progress Notes (Signed)
Patient ID: [name], male   DOB: 10-24-1960, 51 y.o.   MRN: [id number]  [facility] Group Notes:  (Counselor/Nursing/MHT/Case Management/Adjunct)  12/21/2011 11 AM  Type of Therapy:  Aftercare Planning, Group Therapy, Dance/Movement Therapy   Participation Level:  Did Not Attend Counseling:    Rhunette Croft

## 2011-12-21 NOTE — Progress Notes (Signed)
Pt has been up and has been active while in the milieu today, has been attending and participating in various activities, pt has denied any suicidal or homicidal thoughts, pt spoke about possible discharge on Monday, states that he is feeling better, sleeping well and did mention that he has some anxiety but is only mild, pt has received medications without incident, support provided, will continue to monitor

## 2011-12-21 NOTE — Progress Notes (Signed)
[facility] MD Progress Note  12/21/2011 4:03 PM  Diagnosis:  Axis I: Bipolar I Disorder - Depressed.  Generalized Anxiety Disorder.  Alcohol Dependence - In Remission x 10 years.   The patient was seen today and reports the following:   ADL's: Intact.  Sleep: The patient reports to having slept "very well last night. The best since I arrived." Appetite: The patient reports an ongoing good appetite today.   Mild>(1-10) >Severe  Hopelessness (1-10): 0 the same Depression (1-10): 3 the same Anxiety (1-10): 6 less today  Suicidal Ideation: The patient adamantly denies any suicidal ideations today.  Plan: No  Intent: No  Means: No   Homicidal Ideation: The patient adamantly denies any homicidal ideations today.  Plan: No  Intent: No.  Means: No   General Appearance/Behavior: The patient remains friendly and pleasant today. Eye Contact: Good.  Speech: normal Motor Behavior: wnl.  Level of Consciousness: Alert  Mental Status: Oriented x 3.  Mood: euthymic  Affect: Mildly Constricted.  Anxiety Level: pt. Reports he is much less anxious and I suspect this has to do with his wife's presence at the treatment team meeting yesterday. Thought Process: wnl.  Thought Content: The patient denies any current auditory or visual hallucinations or delusional thinking.   Perception:. wnl.  Judgment: Good.  Insight: Good.  Cognition: Oriented to person, place and time.  Sleep:  Number of Hours: 6.75    Vital Signs:Blood pressure 111/74, pulse 67, temperature 97.7 F (36.5 C), temperature source Oral, resp. rate 16, height 5\' 11"  (1.803 m), weight 65.318 kg (144 lb).  Current Medications: Current Facility-Administered Medications  Medication Dose Route Frequency Provider Last Rate Last Dose  . acetaminophen (TYLENOL) tablet 650 mg  650 mg Oral Q6H PRN Mickie D. Adams, PA      . alum & mag hydroxide-simeth (MAALOX/MYLANTA) 200-200-20 MG/5ML suspension 30 mL  30 mL Oral Q4H PRN Mickie D. Adams,  PA      . amLODipine (NORVASC) tablet 5 mg  5 mg Oral Daily Curlene Labrum Readling, MD   5 mg at 12/21/11 0830  . atenolol (TENORMIN) tablet 50 mg  50 mg Oral BH-qamhs Randy D Readling, MD   50 mg at 12/21/11 0830  . clobetasol cream (TEMOVATE) 0.05 %   Topical BID Curlene Labrum Readling, MD      . clonazePAM (KLONOPIN) tablet 0.25 mg  0.25 mg Oral BH-qamhs Curlene Labrum Readling, MD   0.25 mg at 12/21/11 0831  . cloNIDine (CATAPRES) tablet 0.3 mg  0.3 mg Oral BH-qamhs Curlene Labrum Readling, MD   0.3 mg at 12/21/11 0831  . etanercept (ENBREL) 25 MG injection KIT 25 mg  25 mg Subcutaneous Once Annia Belt, PHARMD      . etanercept (ENBREL) 50 MG/ML injection 25 mg  25 mg Subcutaneous Custom Curlene Labrum Readling, MD      . gabapentin (NEURONTIN) tablet 800 mg  800 mg Oral BH-q8a2phs Curlene Labrum Readling, MD   800 mg at 12/21/11 1342  . lamoTRIgine (LAMICTAL) tablet 150 mg  150 mg Oral BH-qamhs Curlene Labrum Readling, MD   150 mg at 12/21/11 0832  . lithium carbonate capsule 600 mg  600 mg Oral BH-qamhs Curlene Labrum Readling, MD   600 mg at 12/21/11 1610  . magnesium hydroxide (MILK OF MAGNESIA) suspension 30 mL  30 mL Oral Daily PRN Mickie D. Adams, PA      . nicotine (NICODERM CQ - dosed in mg/24 hours) patch 21 mg  21 mg  Transdermal Daily Curlene Labrum Readling, MD   21 mg at 12/20/11 1324  . sertraline (ZOLOFT) tablet 50 mg  50 mg Oral Daily Curlene Labrum Readling, MD   50 mg at 12/21/11 0833  . traZODone (DESYREL) tablet 100 mg  100 mg Oral Q breakfast Curlene Labrum Readling, MD   100 mg at 12/21/11 0833  . traZODone (DESYREL) tablet 200 mg  200 mg Oral QHS Verne Spurr, PA-C   200 mg at 12/20/11 2137  . DISCONTD: etanercept (ENBREL) 25 MG injection KIT 25 mg  25 mg Subcutaneous Once Ronny Bacon, MD       Lab Results:  Results for orders placed during the hospital encounter of 12/16/11 (from the past 48 hour(s))  LITHIUM LEVEL     Status: Normal   Collection Time   12/20/11  7:26 PM      Component Value Range Comment   Lithium Lvl 1.11   0.80 - 1.40 (mEq/L)     Review of Systems:  Neurological: No headaches, seizures or dizziness reported.  G.I.: The patient denies any constipation or stomach upset today.  Musculoskeletal: The patient reports ongoing joint pain today and has a history of degenerative joint disease.   Time was spent today discussing with the patient his current symptoms. The patient has made good progress since yesterday and his sleep has improved significantly.  He appears less manic today and his anxiety is certainly less today as noted above.  If he continues to do well he is on target for being discharged on Monday. Treatment Plan Summary:  1. Daily contact with patient to assess and evaluate symptoms and progress in treatment.  2. Medication management  3. The patient will deny suicidal ideations or homicidal ideations for 48 hours prior to discharge and have a depression and anxiety rating of 3 or less. The patient will also deny any auditory or visual hallucinations or delusional thinking.  4. The patient will deny any symptoms of substance withdrawal at time of discharge.   Plan:  1. Will continue the patient on his non-psychiatric medications.  2. Will continue the current plan of care as is today with no changes. 3. Possible discharge on Monday.  Rona Ravens. Kelsha Older PAC 12/21/2011, 4:03 PM

## 2011-12-22 MED ORDER — IBUPROFEN 800 MG PO TABS
800.0000 mg | ORAL_TABLET | Freq: Three times a day (TID) | ORAL | Status: DC | PRN
Start: 2011-12-22 — End: 2011-12-23
  Administered 2011-12-22 – 2011-12-23 (×3): 800 mg via ORAL
  Filled 2011-12-22 (×3): qty 1

## 2011-12-22 NOTE — Progress Notes (Signed)
Pt is pleasant and cooperative   He can be intrusive at times and very talkative but redirects well    He has not attended group he has been in the dayroom this morning asleep on the sofa  He is logical in his thinking and coherent in speech  He does admit to some depression but denies suicidal and homicidal ideation  Pt did say his sleep was better but not as good as he wants it to be   Verbal support given  Medications administered and effectiveness monitored   Q 15 min checks  Pt safe at present

## 2011-12-22 NOTE — Progress Notes (Signed)
[facility] MD Progress Note  12/22/2011 3:31 PM  Diagnosis:    Axis I: Bipolar I Disorder - Depressed.                                   Generalized Anxiety Disorder.                                    Alcohol Dependence - In Remission x 10 years.                                    Opiate abuse The patient was seen today and reports the following:   ADL's: Intact.  Sleep: The patient reports to having slept great! Appetite: The patient reports an ongoing good appetite today.   Mild>(1-10) >Severe  Hopelessness (1-10): 2 worse than yesterday Depression (1-10): 4 worse than day before.  Anxiety (1-10): 5  Little worse today  Suicidal Ideation: The patient adamantly denies any suicidal ideations today.  Plan: No  Intent: No  Means: No   Homicidal Ideation: The patient adamantly denies any homicidal ideations today.  Plan: No  Intent: No.  Means: No   General Appearance/Behavior: The patient remains friendly and pleasant today. Eye Contact: Good.  Speech: normal Motor Behavior: wnl.  Level of Consciousness: Alert  Mental Status: Oriented x 3.  Mood: euthymic  Affect: Mildly Constricted.  Anxiety Level: much less today. The patient is actually describing more sx of mania rather than true anxiety. Thought Process: wnl.  Thought Content: The patient denies any current auditory or visual hallucinations or delusional thinking.   Perception:. wnl.  Judgment: Good.  Insight: Good.  Cognition: Oriented to person, place and time.  Sleep:  Number of Hours: 6.25    Vital Signs:Blood pressure 111/74, pulse 67, temperature 97.7 F (36.5 C), temperature source Oral, resp. rate 16, height 5\' 11"  (1.803 m), weight 65.318 kg (144 lb).  Current Medications: Current Facility-Administered Medications  Medication Dose Route Frequency Provider Last Rate Last Dose  . acetaminophen (TYLENOL) tablet 650 mg  650 mg Oral Q6H PRN Mickie D. Adams, PA      . alum & mag hydroxide-simeth (MAALOX/MYLANTA)  200-200-20 MG/5ML suspension 30 mL  30 mL Oral Q4H PRN Mickie D. Adams, PA      . amLODipine (NORVASC) tablet 5 mg  5 mg Oral Daily Curlene Labrum Readling, MD   5 mg at 12/21/11 0830  . atenolol (TENORMIN) tablet 50 mg  50 mg Oral BH-qamhs Randy D Readling, MD   50 mg at 12/21/11 0830  . clobetasol cream (TEMOVATE) 0.05 %   Topical BID Curlene Labrum Readling, MD      . clonazePAM (KLONOPIN) tablet 0.25 mg  0.25 mg Oral BH-qamhs Curlene Labrum Readling, MD   0.25 mg at 12/21/11 0831  . cloNIDine (CATAPRES) tablet 0.3 mg  0.3 mg Oral BH-qamhs Curlene Labrum Readling, MD   0.3 mg at 12/21/11 0831  . etanercept (ENBREL) 25 MG injection KIT 25 mg  25 mg Subcutaneous Once Annia Belt, PHARMD      . etanercept (ENBREL) 50 MG/ML injection 25 mg  25 mg Subcutaneous Custom Randy D Readling, MD      . gabapentin (NEURONTIN) tablet 800 mg  800 mg Oral BH-[id number] Randy D Readling,  MD   800 mg at 12/21/11 1342  . lamoTRIgine (LAMICTAL) tablet 150 mg  150 mg Oral BH-qamhs Curlene Labrum Readling, MD   150 mg at 12/21/11 0832  . lithium carbonate capsule 600 mg  600 mg Oral BH-qamhs Curlene Labrum Readling, MD   600 mg at 12/21/11 1610  . magnesium hydroxide (MILK OF MAGNESIA) suspension 30 mL  30 mL Oral Daily PRN Mickie D. Adams, PA      . nicotine (NICODERM CQ - dosed in mg/24 hours) patch 21 mg  21 mg Transdermal Daily Curlene Labrum Readling, MD   21 mg at 12/20/11 1324  . sertraline (ZOLOFT) tablet 50 mg  50 mg Oral Daily Curlene Labrum Readling, MD   50 mg at 12/21/11 0833  . traZODone (DESYREL) tablet 100 mg  100 mg Oral Q breakfast Curlene Labrum Readling, MD   100 mg at 12/21/11 0833  . traZODone (DESYREL) tablet 200 mg  200 mg Oral QHS Verne Spurr, PA-C   200 mg at 12/20/11 2137  . DISCONTD: etanercept (ENBREL) 25 MG injection KIT 25 mg  25 mg Subcutaneous Once Ronny Bacon, MD       Lab Results:  Results for orders placed during the hospital encounter of 12/16/11 (from the past 48 hour(s))  LITHIUM LEVEL     Status: Normal   Collection Time    12/20/11  7:26 PM      Component Value Range Comment   Lithium Lvl 1.11  0.80 - 1.40 (mEq/L)     Review of Systems:  Neurological: No headaches, seizures or dizziness reported.  G.I.: The patient denies any constipation or stomach upset today.  Musculoskeletal: The patient reports ongoing joint pain today and has a history of degenerative joint disease.   Time was spent today discussing with the patient his current symptoms. He feels he is ready for discharge in the morning and is able to return home.  He says he has had a significant reduction in symptoms, a good response to medication and an understanding has been reached with his wife regarding using narcotics and switching his medications.  He will also need a referral to Dr. Runell Gess office for a new patient.     Treatment Plan Summary:  1. Daily contact with patient to assess and evaluate symptoms and progress in treatment.  2. Medication management  3. The patient will deny suicidal ideations or homicidal ideations for 48 hours prior to discharge and have a depression and anxiety rating of 3 or less. The patient will also deny any auditory or visual hallucinations or delusional thinking.  4. The patient will deny any symptoms of substance withdrawal at time of discharge.   Plan:  1. Will continue the patient on his non-psychiatric medications.  2. Will continue the current plan of care as is today with no changes. 3. Plan to discharge on Monday. 4. Samples will be ordered for tomorrow.  Rona Ravens. Brandell Maready PAC 12/22/2011, 3:31 PM

## 2011-12-22 NOTE — Progress Notes (Signed)
Patient ID: [name], male   DOB: Jun 07, 1961, 51 y.o.   MRN: [id number] Has been very pleasant and cooperative this evening, has been in the dayroom for group/game, interacting well with staff and peers, conversation logical and appropriate.  Stated his wife wasn't able to come this evening with his medication, but he was very supportive of her going to the ED instead for a spider bite.  Compliant with his meds, no other c/o's voiced at this time, denies si/hi/avh. Will continue to monitor.

## 2011-12-23 MED ORDER — TRAZODONE HCL 50 MG PO TABS: ORAL_TABLET | ORAL | Status: DC

## 2011-12-23 MED ORDER — AMLODIPINE BESYLATE 5 MG PO TABS: 5.0000 mg | ORAL_TABLET | Freq: Every day | ORAL | Status: DC

## 2011-12-23 MED ORDER — LITHIUM CARBONATE 600 MG PO CAPS: 600.0000 mg | ORAL_CAPSULE | Freq: Two times a day (BID) | ORAL | Status: DC

## 2011-12-23 MED ORDER — CLOBETASOL PROPIONATE 0.05 % EX SOLN: 1.0000 "application " | Freq: Two times a day (BID) | CUTANEOUS | Status: DC

## 2011-12-23 MED ORDER — LAMOTRIGINE 150 MG PO TABS: 150.0000 mg | ORAL_TABLET | ORAL | Status: DC

## 2011-12-23 MED ORDER — CLONIDINE HCL 0.3 MG PO TABS: 0.3000 mg | ORAL_TABLET | Freq: Two times a day (BID) | ORAL | Status: DC

## 2011-12-23 MED ORDER — GABAPENTIN 800 MG PO TABS: 800.0000 mg | ORAL_TABLET | ORAL | Status: DC

## 2011-12-23 MED ORDER — ATENOLOL 25 MG PO TABS: 50.0000 mg | ORAL_TABLET | Freq: Two times a day (BID) | ORAL | Status: DC

## 2011-12-23 MED ORDER — SERTRALINE HCL 50 MG PO TABS: 50.0000 mg | ORAL_TABLET | Freq: Every day | ORAL | Status: DC

## 2011-12-23 NOTE — Tx Team (Signed)
Interdisciplinary Treatment Plan Update (Adult)  Date:  12/23/2011  Time Reviewed:  10:15AM-11:15AM  Progress in Treatment: Attending groups:   Participating in groups:     Taking medication as prescribed:     Tolerating medication:    Family/Significant other contact made:   Patient understands diagnosis:    Discussing patient identified problems/goals with staff:    Medical problems stabilized or resolved:    Denies suicidal/homicidal ideation: Yes  In tx team  Issues/concerns per patient self-inventory:   None noted Other:    New problem(s) identified: No, Describe:  D/c today  Reason for Continuation of Hospitalization: Other; describe D/C today  Interventions implemented related to continuation of hospitalization:  Medication monitoring and adjustment, safety checks Q15 min., suicide risk assessment, group therapy, psychoeducation, collateral contact, aftercare planning, ongoing physician assessments, medication education  Additional comments:  Not applicable  Estimated length of stay:    Discharge Plan:  Return home, follow up outpt  New goal(s):  Not applicable  Review of initial/current patient goals per problem list:   1.  Goal(s):  Decrease anxiety to 3 or less  Met:  Yes  Target date:  By Discharge   As evidenced by:  Reports that anxiety is "0" today  2.  Goal(s):  Address substance abuse issues  Met:  Yes  Target date:  By Discharge   As evidenced by:  Previously met, see 5/17 treatment team update  3.  Goal(s):  Eliminate psychosis  Met:  Yes  Target date:  By Discharge   As evidenced by:  Previously met, see 5/17 treatment team update  4.  Goal(s):  Stabilize mood through the use of medication, coping skills  Met:  Yes  Target date:  By Discharge   As evidenced by:  Mood is stable per patient and his wife, reports anxiety at "2" and hopelessness at "0"  Attendees: Patient:  [name]  12/23/2011 10:15AM-11:15AM  Family:       Physician:  Dr. Harvie Heck Readling 12/23/2011 10:15AM-11:15AM  Nursing:   Tacy Learn, RN 12/23/2011 10:15AM -11:15AM   Case Manager:  Richelle Ito, LCSW 12/23/2011 10:15AM-11:15AM  Counselor:  [name], MT-BC 12/23/2011 10:15AM-11:15AM  Other:   Verne Spurr, PA 12/23/2011 10:15AM-11:15AM  Other:   Ambrose Mantle, LCSW 12/23/2011 10:15AM-11:15AM  Other:      Other:       Scribe for Treatment Team:   Sarina Ser, 12/23/2011, 10:15AM-11:15AM

## 2011-12-23 NOTE — Discharge Summary (Signed)
Physician Discharge Summary Note  Patient:  [name] is an 51 y.o., male MRN:  [id number] DOB:  [birth year] Patient phone:  [phone] (home)  Patient address:   [address] [place] [address],   Date of Admission:  12/16/2011 Date of Discharge: 12/23/2011  Reason for Admission: Altered mental status  Discharge Diagnoses: Principal Problem:  *Bipolar 1 disorder, depressed, moderate Active Problems:  Generalized anxiety disorder  Alcohol dependence in remission  Opioid abuse, episodic use  Discharge Diagnoses:  Axis I: Bipolar I Disorder - Depressed - Currently Under Good Control.  Generalized Anxiety Disorder.  Alcohol Dependence - In Remission x 10 years.  Narcotic Abuse - Episodic.  Axis II: Deferred.  Axis III: 1. Tachycardia.  2. Peripheral Neuropathy.  3. Avascular Necrosis.  4. Psoriasis.  5. Hypertension.  6. Hepatitis C.  Axis IV: Chronic Mental Illness. Chronic Non-psychiatric medical issues.  Axis V: GAF at time of admission approximately 35. GAF at time of discharge approximately 55   Level of Care:  OP  Hospital Course:  [name] was admitted for altered mental status after presenting to the ED.  He was taken to the ED by his brother.  When he returned to the unit he was restarted on the medications that he was most recently discharged upon.  Further questioning revealed that [name]'s wife had given him at least 8 Vicodin at a time prior to his return to the ED for altered mental status.  She is reported to have stopped his klonopin in conjunction with his current psychiatrist.  His wife was asked to participate in a treatment team meeting to discuss this issue.  She was made fully aware that changing medications and giving him medications that aren't prescribed for him is not appropriate.  She is informed that it is illegal and could endanger [name]'s health.  She agreed not to do this again and all agreed that [name] should not use Vicodin at  all, and that his Klonopin would be discontinued upon his discharge.    [name] demonstrated continued improvement over the next several days with good sleep and an overall reduction in his symptoms of mania.  He expressed concern that he would like a different psychiatrist and several recommendations were offered.  On the day of discharge [name] felt well and had no complaints.  He was in full contact with reality and showed no symptoms of psychosis. He denied SI/HI and was felt to be ready for discharge with a good follow up plan.  Consults:  None  Significant Diagnostic Studies:  None  Discharge Vitals:   Blood pressure 110/73, pulse 65, temperature 97.8 F (36.6 C), temperature source Oral, resp. rate 20, height 5\' 11"  (1.803 m), weight 65.318 kg (144 lb).  Mental Status Exam: See Mental Status Examination and Suicide Risk Assessment completed by Attending Physician prior to discharge.  Discharge destination:  Home  Is patient on multiple antipsychotic therapies at discharge:  No   Has Patient had three or more failed trials of antipsychotic monotherapy by history:  No  Recommended Plan for Multiple Antipsychotic Therapies: Not applicable.   Discharge Orders    Future Orders Please Complete By Expires   Diet - low sodium heart healthy      Increase activity slowly      Discharge instructions      Comments:   Take all medications as prescribed, keep all follow up appointments as scheduled.     Medication List  As of 12/23/2011  10:11 AM   STOP taking these medications         clonazePAM 0.5 MG tablet      gabapentin 300 MG capsule         TAKE these medications      Indication    amLODipine 5 MG tablet   Commonly known as: NORVASC   Take 1 tablet (5 mg total) by mouth daily. For hpertension.    Indication: High Blood Pressure      atenolol 25 MG tablet   Commonly known as: TENORMIN   Take 2 tablets (50 mg total) by mouth 2 (two) times daily. For hypertension.     Indication: High Blood Pressure      clobetasol 0.05 % external solution   Commonly known as: TEMOVATE   Apply 1 application topically 2 (two) times daily. Applied to sensitive areas for psoriasis.    for psoriasis    cloNIDine 0.3 MG tablet   Commonly known as: CATAPRES   Take 1 tablet (0.3 mg total) by mouth 2 (two) times daily. For hypertension.    Indication: High Blood Pressure      etanercept 50 MG/ML injection   Commonly known as: ENBREL   Inject 0.49 mLs (25 mg total) into the skin 2 (two) times a week. Given on Tuesdays and Saturdays, for psoriasis.    Indication: Psoriasis associated with Arthritis      gabapentin 800 MG tablet   Commonly known as: NEURONTIN   Take 1 tablet (800 mg total) by mouth 3 (three) times daily at 8am, 2pm and bedtime. For anxiety and pain.    Indication: Pain      lamoTRIgine 150 MG tablet   Commonly known as: LAMICTAL   Take 1 tablet (150 mg total) by mouth 2 (two) times daily in the am and at bedtime.. For mood stability.    Indication: Depressive Phase of Manic-Depression      lithium 600 MG capsule   Take 1 capsule (600 mg total) by mouth 2 (two) times daily with a meal. For mood stability.    Indication: Manic-Depression      sertraline 50 MG tablet   Commonly known as: ZOLOFT   Take 1 tablet (50 mg total) by mouth daily. For anxiety and depression.    Indication: Anxiety Disorder      traZODone 50 MG tablet   Commonly known as: DESYREL   Take 1 tablet (50mg ) each morning for anxiety and 2 tablets (100mg ) at bedtime for sleep    Indication: Anxiety Disorder           Follow-up Information    Follow up with Dr Diona Browner on 12/26/2011. (1:00)    Contact information:   [phone]      Follow up with [facility]. (Call to make an appointment to join their groups Monday thru Thursday from 10:00 to 11:30)    Contact information:   [address]    [address]  [phone]       Follow up with Dr  Runell Gess office [if you decide to switch]  Just know it will be more expensive.   Contact information:   [phone]         Follow[facility]up recommendations:  [facility] [place] as stated above.  Comments:  Hopefully Haniel will avoid acquiring Vicodin and Klonopin in the future.  Signed: Rona Ravens. Danyael Alipio PAC For Dr.Randy D.Readling 12/23/2011, 10:11 AM

## 2011-12-23 NOTE — Progress Notes (Signed)
[facility] Group Notes:  (Counselor/Nursing/MHT/Case Management/Adjunct)  12/23/2011 4:04 PM  Type of Therapy:  Group Therapy  Participation Level:  Did Not Attend   [name] 12/23/2011, 4:04 PM

## 2011-12-23 NOTE — [facility] Suicide Risk Assessment (Signed)
Suicide Risk Assessment  Discharge Assessment     Demographic factors:  Male;Caucasian   Current Mental Status Per Nursing Assessment::  On Admission: (denies)  At Discharge: AO x 3. The patient denies any depressive symptoms today and reports mild anxiety. He denies any auditory or visual hallucinations or delusional thinking as well as any SI/HI.  He reports that his moods are level.  Current Mental Status Per Physician:   Diagnosis:  Axis I: Bipolar I Disorder - Depressed.  Generalized Anxiety Disorder.  Alcohol Dependence - In Remission x 10 years.  Narcotic Abuse - Episodic.  The patient was seen today and reports the following:   ADL's: Intact.  Sleep: The patient reports to sleeping well last night without difficulty.  Appetite: The patient reports an ongoing good appetite today.   Mild>(1-10) >Severe  Hopelessness (1-10): 0  Depression (1-10): 0  Anxiety (1-10): 2   Suicidal Ideation: The patient adamantly denies any suicidal ideations today.  Plan: No  Intent: No  Means: No   Homicidal Ideation: The patient adamantly denies any homicidal ideations today.  Plan: No  Intent: No.  Means: No   General Appearance/Behavior: The patient remains friendly and cooperative today with this provider.  Eye Contact: Good.  Speech: Appropriate in rate and volume with no pressuring of speech noted today.  Motor Behavior: wnl.  Level of Consciousness: Alert and Oriented x 3.  Mental Status: Alert and Oriented x 3.  Mood: Essentially Euthymic.  Affect: Bright and Full.  Anxiety Level: Mild anxiety reported today.  Thought Process: wnl.  Thought Content: The patient denies any auditory or visual hallucinations or delusional thinking.  Perception:. wnl.  Judgment: Good.  Insight: Good.  Cognition: Oriented to person, place and time.   Loss Factors:  Decline in physical health   Historical Factors:  Family history of mental illness or substance abuse;Impulsivity    Risk Reduction Factors:  Good family support. Good access to healthcare.   Continued Clinical Symptoms:  Depression: Anhedonia  Previous Psychiatric Diagnoses and Treatments  Medical Diagnoses and Treatments/Surgeries  Bipolar I Disorder - Depressed.  Substance Abuse Related Issues.  Discharge Diagnoses:   Axis I:  Bipolar I Disorder - Depressed - Currently Under Good Control.    Generalized Anxiety Disorder.    Alcohol Dependence - In Remission x 10 years.    Narcotic Abuse - Episodic. Axis II:   Deferred.  Axis III:  1. Tachycardia.    2. Peripheral Neuropathy.    3. Avascular Necrosis.    4. Psoriasis.    5. Hypertension.    6. Hepatitis C.  Axis IV:  Chronic Mental Illness. Chronic Non-psychiatric medical issues.  Axis V:  GAF at time of admission approximately 35. GAF at time of discharge approximately 55.   Cognitive Features That Contribute To Risk:  None Noted.   Review of Systems:  Neurological: No headaches, seizures or dizziness reported.  G.I.: The patient denies any constipation or stomach upset today.  Musculoskeletal: The patient reports significant joint pain today and has a history of degenerative joint disease.   Time was spent today discussing with the patient his current symptoms. The patient reports that he is sleeping well without difficulty and reports a good appetite. He denies any significant depressive symptoms and reports mild anxiety.  The patient denies any SI/HI. He also denies any auditory or visual hallucinations or delusional thinking.  The patient reports that his moods are level and he denies any medication related side effects.  Overall the patient states that he is "doing well" and is ready for discharge.   Treatment Plan Summary:  1. Daily contact with patient to assess and evaluate symptoms and progress in treatment.  2. Medication management  3. The patient will deny suicidal ideations or homicidal ideations for 48 hours prior to  discharge and have a depression and anxiety rating of 3 or less. The patient will also deny any auditory or visual hallucinations or delusional thinking.  4. The patient will deny any symptoms of substance withdrawal at time of discharge.   Plan:  1. Will continue the patient on his current medications.  2. Laboratory Studies reviewed.  3. Will continue to monitor.  4. Discharge today to outpatient follow up at the patient's request.  Suicide Risk:  Minimal: No identifiable suicidal ideation. Patients presenting with no risk factors but with morbid ruminations; may be classified as minimal risk based on the severity of the depressive symptoms   Plan Of Care/Follow-up recommendations:  Activity: As tolerated.  Diet: Heart Healthy Diet.  Other: Please take all medications only as directed and keep all scheduled follow up appointments.  [name] 12/23/2011, 11:56 AM

## 2011-12-23 NOTE — Progress Notes (Signed)
Patient ID: [name], male   DOB: [birth year], 51 y.o.   MRN: [id number] Has been intrusive at times this evening, but went to dayroom and participated in group,  Stated doesn't get complete relief of pain, wife was unable to bring in his Enbrel today.  Doesn't want to take tylenol d/t hepatitis C.  Order was obtained for motrin 800 mg tid prn, and was given.  Was appreciative, has been achy d/t arthritis.  States is planning for d/c tomorrow.  Denies si/hi.Will continue to monitor.

## 2011-12-23 NOTE — Progress Notes (Signed)
[facility] Case Management Discharge Plan:  Will you be returning to the same living situation after discharge: Yes,  home At discharge, do you have transportation home?:Yes,  wife Do you have the ability to pay for your medications:Yes,  insurance  Interagency Information:     Release of information consent forms completed and in the chart;  Patient's signature needed at discharge.  Patient to Follow up at:  Follow-up Information    Follow up with Dr Diona Browner on 12/26/2011. (1:00)    Contact information:   [phone]      Follow up with [facility]. (Call to make an appointment to join their groups Monday thru Thursday from 10:00 to 11:30)    Contact information:   [address]    [address]  [phone]       Follow up with Dr Runell Gess office [if you decide to switch]  Just know it will be more expensive.   Contact information:   [phone]         Patient denies SI/HI:   Yes,  yes    Safety Planning and Suicide Prevention discussed:  Yes,  yes  Barrier to discharge identified:No.  Summary and Recommendations:   [name] 12/23/2011, 11:49 AM

## 2011-12-23 NOTE — Progress Notes (Signed)
Patient ID: [name], male   DOB: 1961/06/15, 51 y.o.   MRN: [id number] Pt discharged to wife this afternoon, pt denies SI/HI, pt provided with supply of medications as well as prescriptions, pt given discharge instructions and pt verbalized understanding, nicotine patch removed, all belongings returned

## 2011-12-25 NOTE — Progress Notes (Signed)
Patient Discharge Instructions:  After Visit Summary (AVS):   Faxed to:  12/24/2011 Psychiatric Admission Assessment Note:   Faxed to:  12/24/2011 Suicide Risk Assessment - Discharge Assessment:   Faxed to:  12/24/2011 Faxed/Sent to the Next Level Care provider:  12/24/2011  Faxed to [facility] - Dr. Ladona Ridgel @ [phone]  Wandra Scot, 12/25/2011, 4:15 PM

## 2011-12-31 ENCOUNTER — Ambulatory Visit (HOSPITAL_COMMUNITY)
Admission: RE | Admit: 2011-12-31 | Discharge: 2011-12-31 | Disposition: A | Discharge status: Home / Self Care | Payer: Medicare Other | Source: Home / Self Care | Attending: Psychiatry | Admitting: Psychiatry

## 2011-12-31 ENCOUNTER — Inpatient Hospital Stay (HOSPITAL_COMMUNITY)
Admission: EM | Admit: 2011-12-31 | Discharge: 2012-01-02 | DRG: 918 | Disposition: A | Discharge status: Home / Self Care | Payer: Medicare Other | Attending: Internal Medicine | Admitting: Internal Medicine

## 2011-12-31 ENCOUNTER — Encounter (HOSPITAL_COMMUNITY): Payer: Self-pay | Admitting: Emergency Medicine

## 2011-12-31 DIAGNOSIS — T438X5A Adverse effect of other psychotropic drugs, initial encounter: Secondary | ICD-10-CM | POA: Diagnosis present

## 2011-12-31 DIAGNOSIS — F17200 Nicotine dependence, unspecified, uncomplicated: Secondary | ICD-10-CM | POA: Diagnosis present

## 2011-12-31 DIAGNOSIS — T424X4A Poisoning by benzodiazepines, undetermined, initial encounter: Secondary | ICD-10-CM

## 2011-12-31 DIAGNOSIS — T56891A Toxic effect of other metals, accidental (unintentional), initial encounter: Secondary | ICD-10-CM

## 2011-12-31 DIAGNOSIS — Z96649 Presence of unspecified artificial hip joint: Secondary | ICD-10-CM

## 2011-12-31 DIAGNOSIS — T424X1A Poisoning by benzodiazepines, accidental (unintentional), initial encounter: Secondary | ICD-10-CM | POA: Diagnosis present

## 2011-12-31 DIAGNOSIS — R5381 Other malaise: Secondary | ICD-10-CM | POA: Diagnosis present

## 2011-12-31 DIAGNOSIS — F1021 Alcohol dependence, in remission: Secondary | ICD-10-CM

## 2011-12-31 DIAGNOSIS — I959 Hypotension, unspecified: Secondary | ICD-10-CM

## 2011-12-31 DIAGNOSIS — B1710 Acute hepatitis C without hepatic coma: Secondary | ICD-10-CM | POA: Diagnosis present

## 2011-12-31 DIAGNOSIS — F3160 Bipolar disorder, current episode mixed, unspecified: Secondary | ICD-10-CM

## 2011-12-31 DIAGNOSIS — I10 Essential (primary) hypertension: Secondary | ICD-10-CM

## 2011-12-31 DIAGNOSIS — I440 Atrioventricular block, first degree: Secondary | ICD-10-CM | POA: Diagnosis present

## 2011-12-31 DIAGNOSIS — R197 Diarrhea, unspecified: Secondary | ICD-10-CM | POA: Diagnosis present

## 2011-12-31 DIAGNOSIS — F3132 Bipolar disorder, current episode depressed, moderate: Secondary | ICD-10-CM

## 2011-12-31 DIAGNOSIS — I9589 Other hypotension: Secondary | ICD-10-CM | POA: Diagnosis present

## 2011-12-31 DIAGNOSIS — F1110 Opioid abuse, uncomplicated: Secondary | ICD-10-CM | POA: Diagnosis present

## 2011-12-31 DIAGNOSIS — F411 Generalized anxiety disorder: Secondary | ICD-10-CM

## 2011-12-31 DIAGNOSIS — I4430 Unspecified atrioventricular block: Secondary | ICD-10-CM | POA: Diagnosis present

## 2011-12-31 DIAGNOSIS — F1011 Alcohol abuse, in remission: Secondary | ICD-10-CM | POA: Diagnosis present

## 2011-12-31 DIAGNOSIS — T43591A Poisoning by other antipsychotics and neuroleptics, accidental (unintentional), initial encounter: Secondary | ICD-10-CM | POA: Diagnosis present

## 2011-12-31 DIAGNOSIS — F319 Bipolar disorder, unspecified: Secondary | ICD-10-CM | POA: Diagnosis present

## 2011-12-31 DIAGNOSIS — B1920 Unspecified viral hepatitis C without hepatic coma: Secondary | ICD-10-CM | POA: Diagnosis present

## 2011-12-31 DIAGNOSIS — I952 Hypotension due to drugs: Secondary | ICD-10-CM

## 2011-12-31 DIAGNOSIS — R5383 Other fatigue: Secondary | ICD-10-CM

## 2011-12-31 DIAGNOSIS — G609 Hereditary and idiopathic neuropathy, unspecified: Secondary | ICD-10-CM | POA: Diagnosis present

## 2011-12-31 LAB — COMPREHENSIVE METABOLIC PANEL
AST: 62 U/L — ABNORMAL HIGH (ref 0–37)
BUN: 12 mg/dL (ref 6–23)
CO2: 26 mEq/L (ref 19–32)
Calcium: 9.5 mg/dL (ref 8.4–10.5)
Creatinine, Ser: 0.99 mg/dL (ref 0.50–1.35)
GFR calc Af Amer: >90 mL/min (ref >90)
GFR calc non Af Amer: >90 mL/min (ref >90)

## 2011-12-31 LAB — RAPID URINE DRUG SCREEN, HOSP PERFORMED
Amphetamines: NOT DETECTED
Barbiturates: NOT DETECTED
Benzodiazepines: POSITIVE — AB
Tetrahydrocannabinol: NOT DETECTED

## 2011-12-31 LAB — CBC
MCH: 35.2 pg — ABNORMAL HIGH (ref 26.0–34.0)
MCV: 105.2 fL — ABNORMAL HIGH (ref 78.0–100.0)
Platelets: 103 10*3/uL — ABNORMAL LOW (ref 150–400)
RBC: 3.27 MIL/uL — ABNORMAL LOW (ref 4.22–5.81)

## 2011-12-31 LAB — SALICYLATE LEVEL: Salicylate Lvl: <2 mg/dL — ABNORMAL LOW (ref 2.8–20.0)

## 2011-12-31 LAB — ETHANOL: Alcohol, Ethyl (B): <11 mg/dL (ref 0–11)

## 2011-12-31 MED ORDER — NICOTINE 21 MG/24HR TD PT24
21.0000 mg | MEDICATED_PATCH | Freq: Every day | TRANSDERMAL | Status: DC
  Administered 2012-01-01 – 2012-01-02 (×3): 21 mg via TRANSDERMAL
  Filled 2011-12-31 (×3): qty 1

## 2011-12-31 MED ORDER — ACETAMINOPHEN 325 MG PO TABS: 650.0000 mg | ORAL_TABLET | ORAL | Status: DC | PRN

## 2011-12-31 MED ORDER — FLEET ENEMA 7-19 GM/118ML RE ENEM: 1.0000 | ENEMA | Freq: Once | RECTAL | Status: AC | PRN

## 2011-12-31 MED ORDER — NALOXONE HCL 1 MG/ML IJ SOLN
1.0000 mg | Freq: Once | INTRAMUSCULAR | Status: AC
  Administered 2011-12-31: 19:00:00 via INTRAVENOUS
  Filled 2011-12-31: qty 2

## 2011-12-31 MED ORDER — ONDANSETRON HCL 4 MG PO TABS: 4.0000 mg | ORAL_TABLET | Freq: Four times a day (QID) | ORAL | Status: DC | PRN

## 2011-12-31 MED ORDER — POTASSIUM CHLORIDE IN NACL 20-0.9 MEQ/L-% IV SOLN
INTRAVENOUS | Status: DC
  Administered 2011-12-31: 21:00:00 via INTRAVENOUS
  Administered 2012-01-01 (×2): 1000 mL via INTRAVENOUS
  Administered 2012-01-01 – 2012-01-02 (×3): via INTRAVENOUS
  Filled 2011-12-31 (×8): qty 1000

## 2011-12-31 MED ORDER — FLUMAZENIL 0.5 MG/5ML IV SOLN
0.2000 mg | Freq: Once | INTRAVENOUS | Status: AC
  Administered 2011-12-31: 0.2 mg via INTRAVENOUS

## 2011-12-31 MED ORDER — CLOBETASOL PROPIONATE 0.05 % EX SOLN: 1.0000 "application " | Freq: Two times a day (BID) | CUTANEOUS | Status: DC

## 2011-12-31 MED ORDER — ONDANSETRON HCL 4 MG/2ML IJ SOLN: 4.0000 mg | Freq: Four times a day (QID) | INTRAMUSCULAR | Status: DC | PRN

## 2011-12-31 MED ORDER — CLOBETASOL PROPIONATE 0.05 % EX SOLN
1.0000 "application " | Freq: Two times a day (BID) | CUTANEOUS | Status: DC
  Filled 2011-12-31 (×17): qty 1

## 2011-12-31 MED ORDER — SODIUM CHLORIDE 0.9 % IV BOLUS (SEPSIS)
1000.0000 mL | Freq: Once | INTRAVENOUS | Status: AC
  Administered 2011-12-31: 1000 mL via INTRAVENOUS

## 2011-12-31 MED ORDER — FLUMAZENIL 0.5 MG/5ML IV SOLN
INTRAVENOUS | Status: AC
  Administered 2011-12-31: 19:00:00
  Filled 2011-12-31: qty 5

## 2011-12-31 MED ORDER — SODIUM CHLORIDE 0.9 % IJ SOLN
3.0000 mL | Freq: Two times a day (BID) | INTRAMUSCULAR | Status: DC
  Administered 2011-12-31 – 2012-01-01 (×2): 3 mL via INTRAVENOUS

## 2011-12-31 MED ORDER — PNEUMOCOCCAL VAC POLYVALENT 25 MCG/0.5ML IJ INJ
0.5000 mL | INJECTION | INTRAMUSCULAR | Status: AC
  Administered 2012-01-01: 0.5 mL via INTRAMUSCULAR
  Filled 2011-12-31: qty 0.5

## 2011-12-31 MED ORDER — ACETAMINOPHEN 650 MG RE SUPP: 650.0000 mg | Freq: Four times a day (QID) | RECTAL | Status: DC | PRN

## 2011-12-31 MED ORDER — ENOXAPARIN SODIUM 40 MG/0.4ML ~~LOC~~ SOLN
40.0000 mg | SUBCUTANEOUS | Status: DC
  Administered 2012-01-01 (×2): 40 mg via SUBCUTANEOUS
  Filled 2011-12-31 (×3): qty 0.4

## 2011-12-31 NOTE — ED Notes (Signed)
Pt sat on side of bed for blood pressure and B/P dropped to 86/57.

## 2011-12-31 NOTE — ED Notes (Signed)
[facility] was called Spoke with Silvano Rusk recommendations given to Dr Roselyn Bering

## 2011-12-31 NOTE — ED Notes (Signed)
Bed:[facility]<BR> Expected date:12/31/11<BR> Expected time:<BR> Means of arrival:<BR> Comments:<BR> EMS [facility] - [facility] transfer

## 2011-12-31 NOTE — ED Notes (Signed)
Spoke with denise at position control update given on pt status.

## 2011-12-31 NOTE — ED Notes (Signed)
Pt took 1600mg  ibuprofen. BP 110/70. Pt denies pain. Pt a/ox3

## 2011-12-31 NOTE — H&P (Signed)
PCP:   Marye Round, MD, MD   Chief Complaint:  Drug overdose  HPI: [name] is an 51 y.o. male.   Known history of bipolar disorder; home medications do not include opiates nor benzodiazepines; brought in by his brother is concerned because he thought he may have overdosed on a total of 1600 mg of ibuprofen. Patient was noted to be markedly hypotensive, and lethargic in the emergency room. His urine drug screen was positive for benzodiazepines and opiates, And he was sufficiently alert to deny ever using thesse drugs.  He was bolused with 3 L of normal saline, he did not respond to intravenous Narcan and remained hypotensive and the hospitalist service was called to assist.  By the time the hospitalist arrived patient had become even more somnolent and completely unresponsive, but did become arousable after 5 doses of flumazenil. He then admitted that he had been, in his words "lying" and that he had in fact gotten 10 X 0.5 mg tablets of Klonopin off the streets.  History of alertness did not last very long, but he denied any other symptomatology; he denied suicidal ideation.  He refused help quitting cigarettes.   Rewiew of Systems:  The patient denies anorexia, fever, weight loss,, vision loss, decreased hearing, hoarseness, chest pain, syncope, dyspnea on exertion, peripheral edema, balance deficits, hemoptysis, abdominal pain, melena, hematochezia, severe indigestion/heartburn, hematuria, incontinence, genital sores, muscle weakness, suspicious skin lesions, transient blindness, difficulty walking, depression, unusual weight change, abnormal bleeding, enlarged lymph nodes, angioedema, and breast masses.   Past Medical History  Diagnosis Date  . Tachycardia   . Peripheral neuropathy   . Alcohol abuse     hx of quit 4/04  . Bilateral external ear infections 2007  . Tobacco abuse   . Bipolar affective disorder     Followed by Dr. [name]  . Avascular necrosis     SP  right and left hip replacements and L shoulder arthroplasty, secondary to steroid use.  Has been on embrel in past.  . Psoriasis     Previously on embrel, stopped because of price.   . Hypertension   . Hepatitis C   . ARF (acute renal failure) 2008    Multifactorial in setting of over use of ibuprofen and HCTZ./ACEI  . Cirrhosis   . Back pain     Past Surgical History  Procedure Date  . Right total hip replacement 2004    Due to AVN  . Left total hip replacement 2001    Due to AVN  . Closed manipulation, pinning, application of a traction pin right 2002  . Right shoulder arthroplasty     Due to AVN  . Appendectomy 1989    Medications:  HOME MEDS: Prior to Admission medications   Medication Sig Start Date End Date Taking? Authorizing Provider  amLODipine (NORVASC) 5 MG tablet Take 1 tablet (5 mg total) by mouth daily. For hpertension. 12/23/11 12/22/12 Yes Neil Mashburn, PA-C  atenolol (TENORMIN) 25 MG tablet Take 2 tablets (50 mg total) by mouth 2 (two) times daily. For hypertension. 12/23/11  Yes Verne Spurr, PA-C  clobetasol (TEMOVATE) 0.05 % external solution Apply 1 application topically 2 (two) times daily. Applied to sensitive areas for psoriasis. 12/23/11  Yes Verne Spurr, PA-C  cloNIDine (CATAPRES) 0.3 MG tablet Take 1 tablet (0.3 mg total) by mouth 2 (two) times daily. For hypertension. 12/23/11  Yes Verne Spurr, PA-C  etanercept (ENBREL) 50 MG/ML injection Inject 0.49 mLs (25 mg total) into the  skin 2 (two) times a week. Given on Tuesdays and Saturdays, for psoriasis. 12/10/11  Yes Verne Spurr, PA-C  gabapentin (NEURONTIN) 800 MG tablet Take 1 tablet (800 mg total) by mouth 3 (three) times daily at 8am, 2pm and bedtime. For anxiety and pain. 12/23/11 12/22/12 Yes Verne Spurr, PA-C  lamoTRIgine (LAMICTAL) 150 MG tablet Take 1 tablet (150 mg total) by mouth 2 (two) times daily in the am and at bedtime.. For mood stability. 12/23/11 12/22/12 Yes Verne Spurr, PA-C  lithium  600 MG capsule Take 1 capsule (600 mg total) by mouth 2 (two) times daily with a meal. For mood stability. 12/23/11 03/22/12 Yes Neil Mashburn, PA-C  sertraline (ZOLOFT) 50 MG tablet Take 1 tablet (50 mg total) by mouth daily. For anxiety and depression. 12/23/11 12/22/12 Yes Verne Spurr, PA-C  traZODone (DESYREL) 50 MG tablet Take 1 tablet (50mg ) each morning for anxiety and 2 tablets (100mg ) at bedtime for sleep 12/23/11  Yes Verne Spurr, PA-C     Allergies:  Allergies  Allergen Reactions  . Erythromycin     REACTION: GI upset  . Fentanyl Citrate     REACTION: itching  . Hydromorphone Hcl     REACTION: itching  . Prochlorperazine Maleate     REACTION: jaw lock    Social History:   reports that he has been smoking Cigarettes.  He has a 35 pack-year smoking history. He has never used smokeless tobacco. He reports that he does not drink alcohol or use illicit drugs.  Family History: Family History  Problem Relation Age of Onset  . Hypertension Mother   . Bipolar disorder Father   . Obesity Father   . Obesity Brother      Physical Exam: Filed Vitals:   12/31/11 1920 12/31/11 1926 12/31/11 1930 12/31/11 1934  BP: 96/60 100/61 94/62 100/64  Pulse:   60   Temp:      TempSrc:      Resp:   16   SpO2:       Blood pressure 100/64, pulse 60, temperature 98.9 F (37.2 C), temperature source Oral, resp. rate 16, SpO2 9.00%.  GEN:  Lethargic middle-aged Caucasian gentleman lying in the stretcher, insisting he is well enough to go home but cooperative with exam PSYCH:  alert and oriented x4 after Flonase and her; does appear anxious or depressed;  HEENT: Mucous membranes pink and anicteric; PERRLA; EOM intact; no cervical lymphadenopathy nor thyromegaly or carotid bruit; no JVD; Breasts:: Not examined CHEST WALL: No tenderness CHEST: Normal respiration, clear to auscultation bilaterally HEART: Regular rate and rhythm; no murmurs rubs or gallops BACK:  no CVA tenderness ABDOMEN:   soft non-tender; no masses, no organomegaly, normal abdominal bowel sounds; no pannus; no intertriginous candida. Rectal Exam: Not done EXTREMITIES: ; age-appropriate arthropathy of the hands and knees; no edema; no ulcerations. Genitalia: not examined PULSES: 2+ and symmetric SKIN: Normal hydration no rash or ulceration CNS: Cranial nerves 2-12 grossly intact no focal lateralizing neurologic deficit   Labs & Imaging Results for orders placed during the hospital encounter of 12/31/11 (from the past 48 hour(s))  LITHIUM LEVEL     Status: Abnormal   Collection Time   12/31/11  4:13 PM      Component Value Range Comment   Lithium Lvl 1.98 (*) 0.80 - 1.40 (mEq/L)   CBC     Status: Abnormal   Collection Time   12/31/11  4:29 PM      Component Value Range Comment  WBC 3.1 (*) 4.0 - 10.5 (K/uL)    RBC 3.27 (*) 4.22 - 5.81 (MIL/uL)    Hemoglobin 11.5 (*) 13.0 - 17.0 (g/dL)    HCT 16.1 (*) 09.6 - 52.0 (%)    MCV 105.2 (*) 78.0 - 100.0 (fL)    MCH 35.2 (*) 26.0 - 34.0 (pg)    MCHC 33.4  30.0 - 36.0 (g/dL)    RDW 04.5  40.9 - 81.1 (%)    Platelets 103 (*) 150 - 400 (K/uL)   COMPREHENSIVE METABOLIC PANEL     Status: Abnormal   Collection Time   12/31/11  4:29 PM      Component Value Range Comment   Sodium 140  135 - 145 (mEq/L)    Potassium 4.6  3.5 - 5.1 (mEq/L)    Chloride 106  96 - 112 (mEq/L)    CO2 26  19 - 32 (mEq/L)    Glucose, Bld 118 (*) 70 - 99 (mg/dL)    BUN 12  6 - 23 (mg/dL)    Creatinine, Ser 9.14  0.50 - 1.35 (mg/dL)    Calcium 9.5  8.4 - 10.5 (mg/dL)    Total Protein 6.2  6.0 - 8.3 (g/dL)    Albumin 3.5  3.5 - 5.2 (g/dL)    AST 62 (*) 0 - 37 (U/L)    ALT 57 (*) 0 - 53 (U/L)    Alkaline Phosphatase 92  39 - 117 (U/L)    Total Bilirubin 0.3  0.3 - 1.2 (mg/dL)    GFR calc non Af Amer >90  >90 (mL/min)    GFR calc Af Amer >90  >90 (mL/min)   ETHANOL     Status: Normal   Collection Time   12/31/11  4:29 PM      Component Value Range Comment   Alcohol, Ethyl (B) <11  0  - 11 (mg/dL)   ACETAMINOPHEN LEVEL     Status: Normal   Collection Time   12/31/11  4:29 PM      Component Value Range Comment   Acetaminophen (Tylenol), Serum <15.0  10 - 30 (ug/mL)   SALICYLATE LEVEL     Status: Abnormal   Collection Time   12/31/11  4:29 PM      Component Value Range Comment   Salicylate Lvl <2.0 (*) 2.8 - 20.0 (mg/dL)   URINE RAPID DRUG SCREEN (HOSP PERFORMED)     Status: Abnormal   Collection Time   12/31/11  5:51 PM      Component Value Range Comment   Opiates POSITIVE (*) NONE DETECTED     Cocaine NONE DETECTED  NONE DETECTED     Benzodiazepines POSITIVE (*) NONE DETECTED     Amphetamines NONE DETECTED  NONE DETECTED     Tetrahydrocannabinol NONE DETECTED  NONE DETECTED     Barbiturates NONE DETECTED  NONE DETECTED     No results found.    Assessment Present on Admission:  HYpotension .Benzodiazepine (tranquilizer) overdose .HEPATITIS C .BIPOLAR AFFECTIVE DISORDER .TOBACCO ABUSE .HYPERTENSION, now hypotensive   PLAN: Admit this gentleman to the step down unit for cardiovascular support while these psychotropic drugs are metabolized out of his system.  Will withhold all psychotropics and sedatives; continue vigorous hydration;  We'll allow him to sleep off the medication.  Psychiatric consult in the Other plans as per orders.  Critical care time: 60 minutes. \  Shruthi Northrup 12/31/2011, 7:59 PM

## 2011-12-31 NOTE — ED Notes (Signed)
Pt wife stated that MDs can talk to Brother,but brother does not want brother to vist per wife

## 2011-12-31 NOTE — ED Notes (Addendum)
Pt thinks that he has food in his hands.

## 2011-12-31 NOTE — ED Provider Notes (Signed)
History     CSN: [id number] Arrival date & time 12/31/11  1443 First MD Initiated Contact with Patient 12/31/11 1527     CC: Possible overdose HPI The patient was brought into the emergency room for possible drug overdose. Per EMS patient took 1600 mg of ibuprofen and then was brought into the emergency department. This information was according to family members apparently. The patient denies taking any extra medications. He does have a history of bipolar disorder, alcohol abuse and substance abuse. He was recently in behavioral health hospital and was released 8 days ago.  Patient denies taking any extra medications today.  He is somewhat angry and thinks his brother called 911. Patient does not know why his brother would do this but states he is an  "asshole".  The patient denies any headache, chest pain, vomiting or diarrhea. He denies any suicidal or homicidal ideation. Past Medical History  Diagnosis Date  . Tachycardia   . Peripheral neuropathy   . Alcohol abuse     hx of quit 4/04  . Bilateral external ear infections 2007  . Tobacco abuse   . Bipolar affective disorder     Followed by Dr. [name]  . Avascular necrosis     SP right and left hip replacements and L shoulder arthroplasty, secondary to steroid use.  Has been on embrel in past.  . Psoriasis     Previously on embrel, stopped because of price.   . Hypertension   . Hepatitis C   . ARF (acute renal failure) 2008    Multifactorial in setting of over use of ibuprofen and HCTZ./ACEI  . Cirrhosis   . Back pain     Past Surgical History  Procedure Date  . Right total hip replacement 2004    Due to AVN  . Left total hip replacement 2001    Due to AVN  . Closed manipulation, pinning, application of a traction pin right 2002  . Right shoulder arthroplasty     Due to AVN  . Appendectomy 1989    Family History  Problem Relation Age of Onset  . Hypertension Mother   . Bipolar disorder Father   . Obesity Father   .  Obesity Brother     History  Substance Use Topics  . Smoking status: Current Everyday Smoker -- 1.0 packs/day for 35 years    Types: Cigarettes  . Smokeless tobacco: Never Used   Comment: Not interested in stopping.  . Alcohol Use: No     Previously alcoholic, denies ETOH for "past few weeks"      Review of Systems  All other systems reviewed and are negative.    Allergies  Erythromycin; Fentanyl citrate; Hydromorphone hcl; and Prochlorperazine maleate  Home Medications   Current Outpatient Rx  Name Route Sig Dispense Refill  . AMLODIPINE BESYLATE 5 MG PO TABS Oral Take 1 tablet (5 mg total) by mouth daily. For hpertension. 30 tablet 0  . ATENOLOL 25 MG PO TABS Oral Take 2 tablets (50 mg total) by mouth 2 (two) times daily. For hypertension. 120 tablet 0  . CLOBETASOL PROPIONATE 0.05 % EX SOLN Topical Apply 1 application topically 2 (two) times daily. Applied to sensitive areas for psoriasis. 50 mL 0  . CLONIDINE HCL 0.3 MG PO TABS Oral Take 1 tablet (0.3 mg total) by mouth 2 (two) times daily. For hypertension. 60 tablet 0  . ETANERCEPT 50 MG/ML [place] SOLN Subcutaneous Inject 0.49 mLs (25 mg total) into the skin 2 (  two) times a week. Given on Tuesdays and Saturdays, for psoriasis. 0.98 mL 0  . GABAPENTIN 800 MG PO TABS Oral Take 1 tablet (800 mg total) by mouth 3 (three) times daily at 8am, 2pm and bedtime. For anxiety and pain. 90 tablet 0  . LAMOTRIGINE 150 MG PO TABS Oral Take 1 tablet (150 mg total) by mouth 2 (two) times daily in the am and at bedtime.. For mood stability. 60 tablet 0  . LITHIUM CARBONATE 600 MG PO CAPS Oral Take 1 capsule (600 mg total) by mouth 2 (two) times daily with a meal. For mood stability. 60 capsule 0  . SERTRALINE HCL 50 MG PO TABS Oral Take 1 tablet (50 mg total) by mouth daily. For anxiety and depression. 30 tablet 0  . TRAZODONE HCL 50 MG PO TABS  Take 1 tablet (50mg ) each morning for anxiety and 2 tablets (100mg ) at bedtime for sleep 30 tablet 0      BP 89/57  Pulse 62  Temp(Src) 98.9 F (37.2 C) (Oral)  Resp 10  SpO2 9%  Physical Exam  Nursing note and vitals reviewed. Constitutional: He appears well-developed and well-nourished. No distress.       Somnolent  HENT:  Head: Normocephalic and atraumatic.  Right Ear: External ear normal.  Left Ear: External ear normal.  Eyes: Conjunctivae are normal. Right eye exhibits no discharge. Left eye exhibits no discharge. No scleral icterus.  Neck: Normal range of motion. Neck supple. No tracheal deviation present.  Cardiovascular: Normal rate, regular rhythm and intact distal pulses.   Pulmonary/Chest: Effort normal and breath sounds normal. No stridor. No respiratory distress. He has no wheezes. He has no rales.  Abdominal: Soft. Bowel sounds are normal. He exhibits no distension. There is no tenderness. There is no rebound and no guarding.  Musculoskeletal: He exhibits no edema and no tenderness.  Neurological: He is alert. He has normal strength. No sensory deficit. Cranial nerve deficit:  no gross defecits noted. He exhibits normal muscle tone. He displays no seizure activity. Coordination normal.  Skin: Skin is warm and dry. No rash noted.  Psychiatric: His affect is labile. His speech is delayed. His speech is not slurred. He is slowed. He does not exhibit a depressed mood. He expresses no homicidal and no suicidal ideation.    ED Course  Procedures (including critical care time) Rate 63 Sinus rhythm with 1st degree A-V block Left axis deviation Normal ST T waves, normal intervals other than the first degree AV block CRITICAL CARE Performed by: Linwood Dibbles R Total critical care time: 35 Critical care time was exclusive of separately billable procedures and treating other patients. Critical care was necessary to treat or prevent imminent or life-threatening deterioration. Critical care was time spent personally by me on the following activities: development of treatment plan  with patient and/or surrogate as well as nursing, discussions with consultants, evaluation of patient's response to treatment, examination of patient, obtaining history from patient or surrogate, ordering and performing treatments and interventions, ordering and review of laboratory studies, ordering and review of radiographic studies, pulse oximetry and re-evaluation of patient's condition.  Labs Reviewed  CBC - Abnormal; Notable for the following:    WBC 3.1 (*)    RBC 3.27 (*)    Hemoglobin 11.5 (*)    HCT 34.4 (*)    MCV 105.2 (*)    MCH 35.2 (*)    Platelets 103 (*)    All other components within normal limits  COMPREHENSIVE METABOLIC  PANEL - Abnormal; Notable for the following:    Glucose, Bld 118 (*)    AST 62 (*)    ALT 57 (*)    All other components within normal limits  URINE RAPID DRUG SCREEN (HOSP PERFORMED) - Abnormal; Notable for the following:    Opiates POSITIVE (*)    Benzodiazepines POSITIVE (*)    All other components within normal limits  SALICYLATE LEVEL - Abnormal; Notable for the following:    Salicylate Lvl <2.0 (*)    All other components within normal limits  LITHIUM LEVEL - Abnormal; Notable for the following:    Lithium Lvl 1.98 (*)    All other components within normal limits  ETHANOL  ACETAMINOPHEN LEVEL   No results found.    MDM  The patient presents to the emergency room with possible ingestion. He continues to remain somnolent but will arouse easily when I speak to him. He continues to deny any ingestion. It is possible that his symptoms are related to opiates and benzodiazepines. I will give him a dose of Narcan to see if there is any change.  At the bedside his blood pressure continues to be 89/57. I suspect that the 69/40 result is erroneous. I will give the patient a dose of IV fluids. He'll be admitted to the hospital for further treatment.  The patient's lithium level is elevated although at this time not high enough to require any active  intervention.  We will need to continue to monitor this however as it is possible that he did ingest this medication and is just not telling us this. The patient's EKG shows a normal QRS complex. We'll continue to monitor him closely.        Celene Kras, MD 12/31/11 [phone]

## 2011-12-31 NOTE — ED Notes (Signed)
Pt stuporous will wakeup ,but will fall back to sleep.Has some pd of apnea and drop  in bp some occ pvc

## 2011-12-31 NOTE — ED Notes (Signed)
Called to give report nurse unavailable will call back.  

## 2012-01-01 DIAGNOSIS — F3160 Bipolar disorder, current episode mixed, unspecified: Secondary | ICD-10-CM

## 2012-01-01 DIAGNOSIS — F1110 Opioid abuse, uncomplicated: Secondary | ICD-10-CM

## 2012-01-01 DIAGNOSIS — I10 Essential (primary) hypertension: Secondary | ICD-10-CM

## 2012-01-01 DIAGNOSIS — F1011 Alcohol abuse, in remission: Secondary | ICD-10-CM

## 2012-01-01 DIAGNOSIS — F0630 Mood disorder due to known physiological condition, unspecified: Secondary | ICD-10-CM

## 2012-01-01 DIAGNOSIS — I959 Hypotension, unspecified: Secondary | ICD-10-CM

## 2012-01-01 DIAGNOSIS — F319 Bipolar disorder, unspecified: Secondary | ICD-10-CM

## 2012-01-01 LAB — LITHIUM LEVEL: Lithium Lvl: 1.27 mEq/L (ref 0.80–1.40)

## 2012-01-01 LAB — MRSA PCR SCREENING: MRSA by PCR: NEGATIVE

## 2012-01-01 MED ORDER — LORAZEPAM 2 MG/ML IJ SOLN
1.0000 mg | Freq: Once | INTRAMUSCULAR | Status: AC
  Administered 2012-01-01: 1 mg via INTRAVENOUS
  Filled 2012-01-01: qty 1

## 2012-01-01 NOTE — Progress Notes (Signed)
INITIAL ADULT NUTRITION ASSESSMENT Date: 01/01/2012   Time: 11:14 AM Reason for Assessment: Nutrition Risk-weight loss  ASSESSMENT: Male 51 y.o.  Dx: suspected OD  Hx:  Past Medical History  Diagnosis Date  . Tachycardia   . Peripheral neuropathy   . Alcohol abuse     hx of quit 4/04  . Bilateral external ear infections 2007  . Tobacco abuse   . Bipolar affective disorder     Followed by Dr. [name]  . Avascular necrosis     SP right and left hip replacements and L shoulder arthroplasty, secondary to steroid use.  Has been on embrel in past.  . Psoriasis     Previously on embrel, stopped because of price.   . Hypertension   . Hepatitis C   . ARF (acute renal failure) 2008    Multifactorial in setting of over use of ibuprofen and HCTZ./ACEI  . Cirrhosis   . Back pain    Past Surgical History  Procedure Date  . Right total hip replacement 2004    Due to AVN  . Left total hip replacement 2001    Due to AVN  . Closed manipulation, pinning, application of a traction pin right 2002  . Right shoulder arthroplasty     Due to AVN  . Appendectomy 1989    Related Meds: lovenox, romazicon, ativan, narcan, temovate  Ht: 5'11" (180.34cm)  Wt: 158 lb 1.1 oz (71.7 kg)  Ideal Wt: 48kg % Ideal Wt: 149  Usual Wt: 180# per pt Wt Readings from Last 15 Encounters:  01/01/12 158 lb 1.1 oz (71.7 kg)  12/16/11 144 lb (65.318 kg)  12/05/11 139 lb (63.05 kg)  11/29/11 155 lb 6.8 oz (70.5 kg)  10/25/08 173 lb 1.6 oz (78.518 kg)  06/08/08 169 lb (76.658 kg)  02/08/08 167 lb 0.3 oz (75.759 kg)  09/03/07 183 lb 4.8 oz (83.144 kg)  07/22/07 185 lb 0.2 oz (83.921 kg)  05/21/07 177 lb 14.4 oz (80.695 kg)  04/16/07 177 lb 4.8 oz (80.423 kg)  12/19/06 173 lb 11.2 oz (78.79 kg)  12/12/06 171 lb 12.8 oz (77.928 kg)  11/25/06 177 lb 1.6 oz (80.332 kg)  10/15/06 181 lb 1.6 oz (82.146 kg)     % Usual Wt: 88  BMI:  22  Food/Nutrition Related Hx: Pt reports doing the cooking at home  and eating well.  States weight loss is due to mental issues.  Dislikes Ensure.  Will drink [employer].  Labs:  CMP     Component Value Date/Time   NA 140 12/31/2011 1629   K 4.6 12/31/2011 1629   CL 106 12/31/2011 1629   CO2 26 12/31/2011 1629   GLUCOSE 118* 12/31/2011 1629   BUN 12 12/31/2011 1629   CREATININE 0.99 12/31/2011 1629   CALCIUM 9.5 12/31/2011 1629   PROT 6.2 12/31/2011 1629   ALBUMIN 3.5 12/31/2011 1629   AST 62* 12/31/2011 1629   ALT 57* 12/31/2011 1629   ALKPHOS 92 12/31/2011 1629   BILITOT 0.3 12/31/2011 1629   GFRNONAA >90 12/31/2011 1629   GFRAA >90 12/31/2011 1629    I/O last 3 completed shifts: In: 2095 [P.O.:445; I.V.:1650] Out: 750 [Urine:750] Total I/O In: 150 [I.V.:150] Out: 675 [Urine:675]   Diet Order: Cardiac  Supplements/Tube Feeding:none  IVF:    0.9 % NaCl with KCl 20 mEq / L Last Rate: 150 mL/hr at 01/01/12 0406    Estimated Nutritional Needs:   Kcal:  Protein:  Fluid:  Currently  belligerent at times.  Refusing meals "I am not eating that food".  Weight loss of 31# in the last 2 months.  Pt meets criteria for Severe Malnutrition r/t mental illness AEB 12% weight loss in the past 2 months, <75% intake in the last month.  Current intake is snacks.  NUTRITION DIAGNOSIS: -Inadequate oral intake (NI-2.1).  Status: Ongoing  RELATED TO: mental illness  AS EVIDENCE BY: refusal of meals  MONITORING/EVALUATION(Goals): Monitor:  Intake, weight trend, plan of care  EDUCATION NEEDS: -No education needs identified at this time  INTERVENTION:  [employer] Instant Breakfast tid  RN to assist with preferences for meals and snacks (discussed with nurse.)  Discussed importance of nutrition with pt.  Pt calmed down as I was speaking.  Dietitian [phone]  DOCUMENTATION CODES Per approved criteria  -Severe malnutrition in the context of chronic illness    Irais Mottram, Anastasia Fiedler 01/01/2012, 11:14 AM

## 2012-01-01 NOTE — Progress Notes (Signed)
MD requesting IVC documents due to Pt attempting to leave AMA.  Pt admitted due to suspected OD.  Pt threatening to fight male staff.  IVC documents drawn up and notarized.  Notified Magistrate of impending fax.  Faxed IVC documents.  Confirmed receipt of IVC documents by Magistrate.  CSW to continue to follow.  Providence Crosby, LCSWA Clinical Social Work [phone]

## 2012-01-01 NOTE — Progress Notes (Signed)
Subjective: Pt very angry and irrational in his behavior and is unable to remember what transpired to bring him to the hospital. Pt states that he was prescribed 90 Klonopin tabs by Dr. Emmit Pomfret of psychiatry and that his wife took them and hid/threw tem away. He states that he obtained 10 tabs from a friend and took them over a period of 5 days. He also initially states that he has been taking his Lithium then states that he is not sure of if he's been taking them. However, his Lithium levels this morning are mildly elevated at 1.98.   The patient's behavior is best characterized as labile,  angry, irrational and combative and clearly is demonstrating mood instability. He denies suicidal or homicidal behavior but is verbally abusive to staff, myself and his wife and states that although he wouldn't hit a woman, he will fight any man who enters the room. .  Objective: Filed Vitals:   01/01/12 0400 01/01/12 0500 01/01/12 0600 01/01/12 0800  BP:  107/64 119/62 123/59  Pulse: 62 56 63 63  Temp: 97.9 F (36.6 C)   98.3 F (36.8 C)  TempSrc: Oral   Oral  Resp: 18 14 12 18   Height:      Weight: 71.7 kg (158 lb 1.1 oz)     SpO2: 100% 100% 98% 100%   Weight change:   Intake/Output Summary (Last 24 hours) at 01/01/12 0948 Last data filed at 01/01/12 0830  Gross per 24 hour  Intake   2245 ml  Output    900 ml  Net   1345 ml    General: Alert, awake, oriented x3, in no acute distress.  HEENT: [place]/AT PEERL, EOMI Neck: Trachea midline,  no masses, no thyromegal,y no JVD, no carotid bruit OROPHARYNX:  Moist, No exudate/ erythema/lesions.  Heart: Regular rate and rhythm, without murmurs, rubs, gallops, PMI non-displaced, no heaves or thrills on palpation.  Lungs: Clear to auscultation, no wheezing or rhonchi noted. No increased vocal fremitus resonant to percussion  Abdomen: Soft, nontender, nondistended, positive bowel sounds, no masses no hepatosplenomegaly noted..  Neuro: Unable to perform  formal neurological examination, but he moves all extremities against gravity and is able to move well in the bed.  Musculoskeletal: No warm swelling or erythema around joints, no spinal tenderness noted. Psychiatric: The patient's behavior is best characterized as labile,  angry, irrational and combative and clearly is demonstrating mood instability. He denies suicidal or homicidal behavior but is verbally abusive to staff, myself and his wife.     Lab Results:  [facility] 12/31/11 1629  NA 140  K 4.6  CL 106  CO2 26  GLUCOSE 118*  BUN 12  CREATININE 0.99  CALCIUM 9.5  MG --  PHOS --    Basename 12/31/11 1629  AST 62*  ALT 57*  ALKPHOS 92  BILITOT 0.3  PROT 6.2  ALBUMIN 3.5   No results found for this basename: LIPASE:2,AMYLASE:2 in the last 72 hours  Basename 12/31/11 1629  WBC 3.1*  NEUTROABS --  HGB 11.5*  HCT 34.4*  MCV 105.2*  PLT 103*   No results found for this basename: CKTOTAL:3,CKMB:3,CKMBINDEX:3,TROPONINI:3 in the last 72 hours No components found with this basename: POCBNP:3 No results found for this basename: DDIMER:2 in the last 72 hours No results found for this basename: HGBA1C:2 in the last 72 hours No results found for this basename: CHOL:2,HDL:2,LDLCALC:2,TRIG:2,CHOLHDL:2,LDLDIRECT:2 in the last 72 hours No results found for this basename: TSH,T4TOTAL,FREET3,T3FREE,THYROIDAB in the last 72 hours  No results found for this basename: VITAMINB12:2,FOLATE:2,FERRITIN:2,TIBC:2,IRON:2,RETICCTPCT:2 in the last 72 hours  Micro Results: Recent Results (from the past 240 hour(s))  MRSA PCR SCREENING     Status: Normal   Collection Time   12/31/11  9:30 PM      Component Value Range Status Comment   MRSA by PCR NEGATIVE  NEGATIVE  Final     Studies/Results: Dg Chest 1 View  12/03/2011  *RADIOLOGY REPORT*  Clinical Data: Hallucinations.  CHEST - 1 VIEW  Comparison: 08/05/2007  Findings: Cardiomegaly with mild vascular congestion.  No overt edema.  No  confluent opacities or effusions.  No acute bony abnormality.  IMPRESSION: Cardiomegaly, vascular congestion.  Original Report Authenticated By: Cyndie Chime, M.D.   Dg Chest 2 View  12/14/2011  *RADIOLOGY REPORT*  Clinical Data: Altered mental status.  CHEST - 2 VIEW  Comparison: 12/03/2011  Findings: Cardiac enlargement with normal pulmonary vascularity. No focal airspace consolidation in the lungs.  No blunting of costophrenic angles.  No pneumothorax.  Old left rib fracture.  The postoperative changes in both shoulders.  No significant change since previous study.  IMPRESSION: Cardiac enlargement.  No evidence of active pulmonary disease.  Original Report Authenticated By: Marlon Pel, M.D.   Ct Head Wo Contrast  12/14/2011  *RADIOLOGY REPORT*  Clinical Data: Confusion.  CT HEAD WITHOUT CONTRAST  Technique:  Contiguous axial images were obtained from the base of the skull through the vertex without contrast.  Comparison: Head CT 11/29/2011.  Findings: There is no evidence of acute intracranial abnormality including infarction, hemorrhage, mass, mass effect, midline shift or abnormal extra-axial fluid collection.  No hydrocephalus or pneumocephalus.  Calvarium intact.  Imaged paranasal sinuses and mastoid air cells are clear.  Carotid atherosclerosis noted.  IMPRESSION: No acute finding.  Original Report Authenticated By: Bernadene Bell. Maricela Curet, M.D.    Medications: I have reviewed the patient's current medications. Scheduled Meds:   . clobetasol  1 application Topical BID  . enoxaparin  40 mg Subcutaneous Q24H  . flumazenil      . flumazenil  0.2 mg Intravenous Once  . flumazenil  0.2 mg Intravenous Once  . flumazenil  0.2 mg Intravenous Once  . flumazenil  0.2 mg Intravenous Once  . LORazepam  1 mg Intravenous Once  . naloxone (NARCAN) injection  1 mg Intravenous Once  . nicotine  21 mg Transdermal Daily  . pneumococcal 23 valent vaccine  0.5 mL Intramuscular Tomorrow-1000  . sodium  chloride  1,000 mL Intravenous Once  . sodium chloride  1,000 mL Intravenous Once  . sodium chloride  3 mL Intravenous Q12H  . DISCONTD: clobetasol  1 application Topical BID  . DISCONTD: clobetasol  1 application Topical BID   Continuous Infusions:   . 0.9 % NaCl with KCl 20 mEq / L 150 mL/hr at 01/01/12 0406   PRN Meds:.acetaminophen, acetaminophen, ondansetron (ZOFRAN) IV, ondansetron, sodium phosphate Assessment/Plan: Patient Active Hospital Problem List BIPOLAR AFFECTIVE DISORDER (06/23/2006)   Assessment: Pt appears to be having mood instability bordering on psychotic behavior. I do not believe that this patient is safe to be discharged home even if he is medically stable. At present patient is refusing to remain hospitalized, and need further psychiatric evaluation and management.   Plan: I have placed a psychiatric consult with Dr. Ferol Luz. I will involuntarily commit patient for at least 24 hours and if necessary chemically restrain patient with Ativan 2mg  IV and Haldol 5 gm IV.  HEPATITIS C (06/23/2006)  Assessment: LFT's mildly elevated. However no another signs of hepatic decompensation.   TOBACCO ABUSE (06/23/2006)   Assessment: continue Nicoderm transdermal.    HYPERTENSION (06/23/2006)   Assessment: BP stable.    Benzodiazepine (tranquilizer) overdose (12/31/2011)   Assessment: Unclear if patient actually had an overdose as his clinical picture does not support an overdose. However it appears that patient may have obtained Klonopin by means other than prescribed and is fearful in revealing his source. Pt did admit that he likes the feeling obtained by taking Klonopin.    Hypotension due to drugs (12/31/2011)   Assessment: Improved with fluids.   Plan: continue hydration.   LOS: 1 day

## 2012-01-01 NOTE — Progress Notes (Signed)
CARE MANAGEMENT NOTE 01/01/2012  Patient:  [name],[name]   Account Number:  [id number]  Date Initiated:  01/01/2012  Documentation initiated by:  Calysta Craigo  Subjective/Objective Assessment:   spt with overdose of benoz's, history of bipoklar diease, awake but very combative and confused.     Action/Plan:   patient is being ivc due to behavior and threatening to leave.  Very verbally abusinve to md and staff. Threatening to become violent.   Anticipated DC Date:  01/04/2012   Anticipated DC Plan:  [facility]  In-house referral  Clinical Social Worker      DC Planning Services  NA      [facility] Choice  NA   Choice offered to / List presented to:  NA   DME arranged  NA      DME agency  NA     HH arranged  NA      HH agency  NA   Status of service:  In process, will continue to follow Medicare Important Message given?  NA - LOS <3 / Initial given by admissions (If response is "NO", the following Medicare IM given date fields will be blank) Date Medicare IM given:   Date Additional Medicare IM given:    Discharge Disposition:    Per UR Regulation:  Reviewed for med. necessity/level of care/duration of stay  If discussed at Long Length of Stay Meetings, dates discussed:    Comments:  [id number]/Shawnn Bouillon Earlene Plater, RN, BSN, CCM No discharge needs present at time of this review at the sdu/icu level. Case Management [phone]

## 2012-01-01 NOTE — Consult Note (Signed)
Patient Identification:  [name] Date of Evaluation:  01/01/2012 Reason for Consult: Bipolar Agitation, overdose  Referring Provider: Dr. Ashley Royalty  History of Present Illness:pt states he was discharged from [facility] ~ 9 days ago with Rxs.  Wife took his Klonopin away.  There was a phase of hide and seek with the pills.  She also objected to the trazodone that would 'make him"high". Past Psychiatric History:[facility] inpatient admission in May 2013 discharged with Lithium, Zoloft, Trazodone, Klonopin   Past Medical History:     Past Medical History  Diagnosis Date  . Tachycardia   . Peripheral neuropathy   . Alcohol abuse     hx of quit 4/04  . Bilateral external ear infections 2007  . Tobacco abuse   . Bipolar affective disorder     Followed by Dr. [name]  . Avascular necrosis     SP right and left hip replacements and L shoulder arthroplasty, secondary to steroid use.  Has been on embrel in past.  . Psoriasis     Previously on embrel, stopped because of price.   . Hypertension   . Hepatitis C   . ARF (acute renal failure) 2008    Multifactorial in setting of over use of ibuprofen and HCTZ./ACEI  . Cirrhosis   . Back pain        Past Surgical History  Procedure Date  . Right total hip replacement 2004    Due to AVN  . Left total hip replacement 2001    Due to AVN  . Closed manipulation, pinning, application of a traction pin right 2002  . Right shoulder arthroplasty     Due to AVN  . Appendectomy 1989    Allergies:  Allergies  Allergen Reactions  . Erythromycin     REACTION: GI upset  . Fentanyl Citrate     REACTION: itching  . Hydromorphone Hcl     REACTION: itching  . Prochlorperazine Maleate     REACTION: jaw lock    Current Medications:  Prior to Admission medications   Medication Sig Start Date End Date Taking? Authorizing Provider  amLODipine (NORVASC) 5 MG tablet Take 1 tablet (5 mg total) by mouth daily. For hpertension. 12/23/11 12/22/12 Yes Neil  Mashburn, PA-C  atenolol (TENORMIN) 25 MG tablet Take 2 tablets (50 mg total) by mouth 2 (two) times daily. For hypertension. 12/23/11  Yes Verne Spurr, PA-C  clobetasol (TEMOVATE) 0.05 % external solution Apply 1 application topically 2 (two) times daily. Applied to sensitive areas for psoriasis. 12/23/11  Yes Verne Spurr, PA-C  cloNIDine (CATAPRES) 0.3 MG tablet Take 1 tablet (0.3 mg total) by mouth 2 (two) times daily. For hypertension. 12/23/11  Yes Verne Spurr, PA-C  etanercept (ENBREL) 50 MG/ML injection Inject 0.49 mLs (25 mg total) into the skin 2 (two) times a week. Given on Tuesdays and Saturdays, for psoriasis. 12/10/11  Yes Verne Spurr, PA-C  gabapentin (NEURONTIN) 800 MG tablet Take 1 tablet (800 mg total) by mouth 3 (three) times daily at 8am, 2pm and bedtime. For anxiety and pain. 12/23/11 12/22/12 Yes Verne Spurr, PA-C  lamoTRIgine (LAMICTAL) 150 MG tablet Take 1 tablet (150 mg total) by mouth 2 (two) times daily in the am and at bedtime.. For mood stability. 12/23/11 12/22/12 Yes Verne Spurr, PA-C  lithium 600 MG capsule Take 1 capsule (600 mg total) by mouth 2 (two) times daily with a meal. For mood stability. 12/23/11 03/22/12 Yes Neil Mashburn, PA-C  sertraline (ZOLOFT) 50 MG tablet Take  1 tablet (50 mg total) by mouth daily. For anxiety and depression. 12/23/11 12/22/12 Yes Verne Spurr, PA-C  traZODone (DESYREL) 50 MG tablet Take 1 tablet (50mg ) each morning for anxiety and 2 tablets (100mg ) at bedtime for sleep 12/23/11  Yes Verne Spurr, PA-C    Social History:    reports that he has been smoking Cigarettes.  He has a 35 pack-year smoking history. He has never used smokeless tobacco. He reports that he does not drink alcohol or use illicit drugs.   Family History:    Family History  Problem Relation Age of Onset  . Hypertension Mother   . Bipolar disorder Father   . Obesity Father   . Obesity Brother     Mental Status Examination/Evaluation: Objective:  Appearance:  Casual tremulous  Psychomotor Activity:  Increased and Tremor  Eye Contact::  Good  Speech:  Clear and Coherent  Volume:  Normal  Mood:  Angry, Dysphoric and Irritable  Affect:  Inappropriate and angry and discouraged  Thought Process:  Disorganized and can't recall # of pills taken  Orientation:  Full  Thought Content:  Paranoia fearful that he won't be discharged  Suicidal Thoughts:  No  Homicidal Thoughts:  No  Judgement:  Impaired  Insight:  Absent    DIAGNOSIS:   AXIS I   Bipolar I disorder  AXIS II  Deffered  AXIS III See medical notes.  AXIS IV economic problems, other psychosocial or environmental problems, problems related to social environment and problems with primary support group  AXIS V 61-70 mild symptoms     Assessment/Plan:  Consult is called this am due to increased agitation and threatens to leave AMA per pt.  He is seen and sitter leaves the room.  Pt is awake, alert and oriented to person place and situation.  He wanted to leave AMA but admits he took an overdose of Ibuprophen.  He does not know how many [notes say 1600 mg].  Wife was to be in charge of setting out his meds; he did it instead.  No further explanation.  He denies Si  HI.  His brother is present but declines his input until very last part of evaluation.  He is angry with bro but says father in law helped calm him down.  He knew he might be in restraints if he did not.  He likes his 'mania' and says meds changed that for him.  He is told he has an IVC order until MD can evaluate his extreme overdose and its efects on the body.  He is upset but is able to remain calm.  He has had episodes of V-tach which suggest use of Haldol, haloperidol, FGA sparingly.  RECOMMENDATION:  1. He has had episodes of V-tach which suggest use of Haldol, haloperidol, FGA sparingly 2. Lithium level; has sl. Increase and will be monitored to stabilize within therapeutic window.  3. Will follow pt.   Hercules Hasler J. Ferol Luz,  MD Psychiatrist  01/01/2012 6:05 PM

## 2012-01-01 NOTE — Progress Notes (Signed)
Pt yelling, trying to go home, states the only reason he is in here is because his brother thought something was off with his medication. States he is going home. Explanation of why he was here given to patient. Pt still wants to go home. Charge RN Verl Blalock in speaking with the patient at this time. Sherral Hammers paged [phone].

## 2012-01-01 NOTE — Progress Notes (Signed)
Clinical Social Work Department CLINICAL SOCIAL WORK PSYCHIATRY SERVICE LINE ASSESSMENT 01/01/2012  Patient:  [name]  Account:  [id number]  Admit Date:  12/31/2011  Clinical Social Worker:  Doroteo Glassman  Date/Time:  01/01/2012 01:50 PM Referred by:  Physician  Date referred:  01/01/2012 Reason for Referral  Behavioral Health Issues   Presenting Symptoms/Problems (In the person's/family's own words):   Suspected OD.    Abuse/Neglect/Trauma Comments:    Psychiatric medications:  Current Mental Health Hospitalizations/Previous Mental Health History:   Current provider:   Place and Date:   Current Medications:   Previous Impatient Admission/Date/Reason:   Emotional Health / Current Symptoms    Suicide/Self Harm  None reported   Suicide attempt in the past:   Possible suicide attempt yesterday.   Other harmful behavior:    Other Psychotic/Dissociative Symptoms:    Attention/Behavioral Symptoms  Unable to accurately assess  Verbal aggression   Other Attention / Behavioral Symptoms:    Cognitive Impairment  Unable to accurately assess   Other Cognitive Impairment:    Mood and Adjustment  Labile  Paranoia  Pt threatened to hit male staff and was angry and combative prior to Psych CSW's Ax.     Anxiety (frequency):   Phobia (specify):   Compulsive behavior (specify):   Obsessive behavior (specify):   Other:    SBIRT completed (please refer for detailed history):    Self-reported substance use:   Urinary Drug Screen Completed:   Alcohol level:    Environmental/Housing/Living Arrangement  Stable housing   Who is in the home:   Wife   Emergency contact:  Wife   Personnel officer   Patient's Strengths and Goals (patient's own words):   Clinical Social Worker's Interpretive Summary:  Psych MD and psych CSW spoke with Pt and his brother, Damon Needle, re: current admission.  Pt expressed his frustration regarding admission and  continued to state that he was going to be in the hospital "forever", despite CSW and psych MD's assuring him that, once he's medically stable, and once it's deemed that he's not a danger to himself or others, he will be free to go.  Pt's brother attempted to explain this to Pt but Pt continued to remain unconvinced.   Pt became agitated upon learning that he was under IVC, however he calmed down quickly.    Disposition:  Recommend Psych CSW continuing to support while in hospital  CSW to continue to follow.  Providence Crosby, LCSWA Clinical Social Work [phone]

## 2012-01-01 NOTE — Progress Notes (Signed)
Spoke with family in waiting area. They are worried about the patient and troubled by his not wanting to speak to them. Offered support and comfort as they wait to see how things will progress.  01/01/12 1000  Clinical Encounter Type  Visited With Family  Visit Type Initial  Referral From Family  Recommendations Follow up

## 2012-01-02 DIAGNOSIS — T438X4A Poisoning by other psychotropic drugs, undetermined, initial encounter: Secondary | ICD-10-CM

## 2012-01-02 DIAGNOSIS — I4430 Unspecified atrioventricular block: Secondary | ICD-10-CM | POA: Diagnosis present

## 2012-01-02 DIAGNOSIS — F3160 Bipolar disorder, current episode mixed, unspecified: Secondary | ICD-10-CM

## 2012-01-02 DIAGNOSIS — T56891A Toxic effect of other metals, accidental (unintentional), initial encounter: Secondary | ICD-10-CM | POA: Diagnosis present

## 2012-01-02 DIAGNOSIS — I10 Essential (primary) hypertension: Secondary | ICD-10-CM

## 2012-01-02 DIAGNOSIS — F0630 Mood disorder due to known physiological condition, unspecified: Secondary | ICD-10-CM

## 2012-01-02 DIAGNOSIS — R197 Diarrhea, unspecified: Secondary | ICD-10-CM | POA: Diagnosis not present

## 2012-01-02 LAB — BASIC METABOLIC PANEL
BUN: 7 mg/dL (ref 6–23)
Chloride: 112 mEq/L (ref 96–112)
GFR calc Af Amer: >90 mL/min (ref >90)
GFR calc non Af Amer: >90 mL/min (ref >90)
Potassium: 4.5 mEq/L (ref 3.5–5.1)
Sodium: 140 mEq/L (ref 135–145)

## 2012-01-02 LAB — LITHIUM LEVEL: Lithium Lvl: 0.79 mEq/L — ABNORMAL LOW (ref 0.80–1.40)

## 2012-01-02 MED ORDER — ATENOLOL 25 MG PO TABS
25.0000 mg | ORAL_TABLET | Freq: Two times a day (BID) | ORAL | Status: DC
  Filled 2012-01-02 (×2): qty 1

## 2012-01-02 MED ORDER — GABAPENTIN 400 MG PO CAPS
800.0000 mg | ORAL_CAPSULE | Freq: Three times a day (TID) | ORAL | Status: DC
  Filled 2012-01-02 (×3): qty 2

## 2012-01-02 MED ORDER — LITHIUM CARBONATE 300 MG PO CAPS
300.0000 mg | ORAL_CAPSULE | Freq: Three times a day (TID) | ORAL | Status: DC
  Filled 2012-01-02 (×3): qty 1

## 2012-01-02 MED ORDER — LITHIUM CARBONATE 300 MG PO CAPS: 600.0000 mg | ORAL_CAPSULE | Freq: Two times a day (BID) | ORAL | Status: DC

## 2012-01-02 MED ORDER — ATENOLOL 25 MG PO TABS: 25.0000 mg | ORAL_TABLET | Freq: Two times a day (BID) | ORAL | Status: AC

## 2012-01-02 MED ORDER — TRAZODONE HCL 50 MG PO TABS: 50.0000 mg | ORAL_TABLET | Freq: Every day | ORAL | Status: DC

## 2012-01-02 MED ORDER — LITHIUM CARBONATE 300 MG PO CAPS: 300.0000 mg | ORAL_CAPSULE | Freq: Three times a day (TID) | ORAL | Status: AC

## 2012-01-02 MED ORDER — SERTRALINE HCL 50 MG PO TABS
50.0000 mg | ORAL_TABLET | Freq: Every day | ORAL | Status: DC
  Filled 2012-01-02: qty 1

## 2012-01-02 MED ORDER — LAMOTRIGINE 150 MG PO TABS
150.0000 mg | ORAL_TABLET | ORAL | Status: DC
  Filled 2012-01-02 (×3): qty 1

## 2012-01-02 MED ORDER — GABAPENTIN 800 MG PO TABS: 800.0000 mg | ORAL_TABLET | ORAL | Status: DC

## 2012-01-02 MED ORDER — TRAZODONE HCL 100 MG PO TABS
100.0000 mg | ORAL_TABLET | Freq: Every day | ORAL | Status: DC
  Filled 2012-01-02: qty 1

## 2012-01-02 NOTE — Progress Notes (Signed)
Spoke with Pt re: d/c plans.  Pt reports that he intends to follow-up with his outpt provider, [facility], and declines any additional outpt resources.  Per MDs, ok to rescind IVC documents.  Faxed rescinded IVC documents to [employer].  Psych CSW to sign off.  Providence Crosby, LCSWA Clinical Social Work [phone]

## 2012-01-02 NOTE — Consult Note (Addendum)
Patient Identification:  [name] Date of Evaluation:   Reason for Consult: Agitation, Overdose, Bipolar Disorder  Referring Provider: Dr Ashley Royalty History of Present Illness:wife [name] gives hx of husband [pt] wandering about in sedated state.  He refused to be redirected, did not shower, shave; would sit and fall asleep; He came to ED saying he took more Aleve that he was supposed to.  He said MD told him 2400 mg was allowed.  He was also noted to have an elavated Lithium level.   Past Psychiatric History:He just came from [facility].  He had been taking opiates for chronic pain and told that his wife got them from her PCP and then gave them to him.  He says that is no longer the case.    Past Medical History:     Past Medical History  Diagnosis Date  . Tachycardia   . Peripheral neuropathy   . Alcohol abuse     hx of quit 4/04  . Bilateral external ear infections 2007  . Tobacco abuse   . Bipolar affective disorder     Followed by Dr. Hortencia Melendez  . Avascular necrosis     SP right and left hip replacements and L shoulder arthroplasty, secondary to steroid use.  Has been on embrel in past.  . Psoriasis     Previously on embrel, stopped because of price.   . Hypertension   . Hepatitis C   . ARF (acute renal failure) 2008    Multifactorial in setting of over use of ibuprofen and HCTZ./ACEI  . Cirrhosis   . Back pain        Past Surgical History  Procedure Date  . Right total hip replacement 2004    Due to AVN  . Left total hip replacement 2001    Due to AVN  . Closed manipulation, pinning, application of a traction pin right 2002  . Right shoulder arthroplasty     Due to AVN  . Appendectomy 1989    Allergies:  Allergies  Allergen Reactions  . Erythromycin     REACTION: GI upset  . Fentanyl Citrate     REACTION: itching  . Hydromorphone Hcl     REACTION: itching  . Prochlorperazine Maleate     REACTION: jaw lock    Current Medications:  Prior to Admission  medications   Medication Sig Start Date End Date Taking? Authorizing Provider  amLODipine (NORVASC) 5 MG tablet Take 1 tablet (5 mg total) by mouth daily. For hpertension. 12/23/11 12/22/12 Yes Damon Mashburn, PA-C  clobetasol (TEMOVATE) 0.05 % external solution Apply 1 application topically 2 (two) times daily. Applied to sensitive areas for psoriasis. 12/23/11  Yes Damon Spurr, PA-C  etanercept (ENBREL) 50 MG/ML injection Inject 0.49 mLs (25 mg total) into the skin 2 (two) times a week. Given on Tuesdays and Saturdays, for psoriasis. 12/10/11  Yes Damon Spurr, PA-C  gabapentin (NEURONTIN) 800 MG tablet Take 1 tablet (800 mg total) by mouth 3 (three) times daily at 8am, 2pm and bedtime. For anxiety and pain. 12/23/11 12/22/12 Yes Damon Spurr, PA-C  lamoTRIgine (LAMICTAL) 150 MG tablet Take 1 tablet (150 mg total) by mouth 2 (two) times daily in the am and at bedtime.. For mood stability. 12/23/11 12/22/12 Yes Damon Mashburn, PA-C  sertraline (ZOLOFT) 50 MG tablet Take 1 tablet (50 mg total) by mouth daily. For anxiety and depression. 12/23/11 12/22/12 Yes Damon Spurr, PA-C  traZODone (DESYREL) 50 MG tablet Take 1 tablet (50mg ) each morning for  anxiety and 2 tablets (100mg ) at bedtime for sleep 12/23/11  Yes Damon Spurr, PA-C  atenolol (TENORMIN) 25 MG tablet Take 1 tablet (25 mg total) by mouth 2 (two) times daily. 01/02/12 01/01/13  Damon Harm, MD  lithium carbonate 300 MG capsule Take 1 capsule (300 mg total) by mouth 3 (three) times daily with meals. 01/02/12 04/01/12  Damon Harm, MD    Social History:    reports that he has been smoking Cigarettes.  He has a 35 pack-year smoking history. He has never used smokeless tobacco. He reports that he does not drink alcohol or use illicit drugs.   Family History:    Family History  Problem Relation Age of Onset  . Hypertension Mother   . Bipolar disorder Father   . Obesity Father   . Obesity Brother     Mental Status  Examination/Evaluation: Objective:  Appearance: Casual and unshaven  Psychomotor Activity:  Increased and Restlessness anxious  Eye Contact::  Good  Speech:  Clear and Coherent  Volume:  Normal  Mood:  Anxious  Affect:  Congruent  Thought Process:  Coherent and focused on leavin  Orientation:  Full  Thought Content:  Paranoia  Suicidal Thoughts:  No  Homicidal Thoughts:  No  Judgement:  Fair  Insight:  Fair    DIAGNOSIS:   AXIS I   Bipolar Disorder,   AXIS II  Deferred  AXIS III See medical notes.  AXIS IV economic problems, educational problems, other psychosocial or environmental problems, problems with access to health care services and problems with primary support group Poor impulse control  AXIS V 61-70 mild symptoms     Assessment/Plan: Discussed with Dr. Ashley Royalty this am X2 and pharmacist ~12 55pm 3//29/13 Discussed with Psych CSW.  Pt is with wife, [name], who says pt was taking own meds from pill box and it is agreed with both that he will take medications only as set out by [facility].  He is calm, with good eye contact, and goal oriented thoughts.  He is unsure that the co-pay for psychiatry is much higher than for other visits with PCP.  He says his medication will be placed out for the day if wife [name], must leave for an errand.  He admits to chronic pain and appears to understand the limits of using Aleve. RECOMMENDATION:  1. Pt has an acceptable lithium limit.  2. Pt may be discharged to home when medically stable.  3. Dr. Ashley Royalty wanted to DC the IVC this am.  4. Pt is not a flight risk.  5. Psycho-education provided about lithium levels, therapeutic window and rationale for Checking lithium level. 6. Continue with decreased Lithium 300 mg 3Xdaily, Zoloft 50 mg daily, Lamictal150 mg daily trazodone 100 mg at bedtime,  neurontin 800 mg 3 X daily  Damon Vecchione J. Ferol Luz, MD Psychiatrist  01/02/2012 4:24 PM

## 2012-01-02 NOTE — Discharge Summary (Signed)
[name] MRN: [id number] DOB/AGE: 1961-06-19 51 y.o.  Admit date: 12/31/2011 Discharge date: 01/02/2012  Primary Care Physician:  Marye Round, MD, MD   Discharge Diagnoses:   Patient Active Problem List  Diagnoses  . HEPATITIS C  . DISEASE, INFECTIOUS/PARASITIC NOS  . BIPOLAR AFFECTIVE DISORDER  . ERECTILE DYSFUNCTION  . TOBACCO ABUSE  . PERIPHERAL NEUROPATHY  . HYPERTENSION  . DISORDER, DENTAL NOS  . PSORIASIS  . SHOULDER PAIN, RIGHT, CHRONIC  . AVASCULAR NECROSIS  . DRUG OVERDOSE  . ALCOHOL ABUSE, HX OF  . TACHYCARDIA, HX OF  . STATUS, SHOULDER JOINT REPLACEMENT  . Hip Joint Replacement by Other Means  . APPENDECTOMY, HX OF  . Movement disorder  . Bipolar 1 disorder, depressed, moderate  . Generalized anxiety disorder  . Alcohol dependence in remission  . Opioid abuse, episodic use  . Benzodiazepine (tranquilizer) overdose  . Hypotension due to drugs  . Diarrhea  . AVB (atrioventricular block)  . Lithium toxicity    DISCHARGE MEDICATION: Medication List  As of 01/02/2012  7:15 PM   STOP taking these medications         cloNIDine 0.3 MG tablet         TAKE these medications         amLODipine 5 MG tablet   Commonly known as: NORVASC   Take 1 tablet (5 mg total) by mouth daily. For hpertension.      atenolol 25 MG tablet   Commonly known as: TENORMIN   Take 1 tablet (25 mg total) by mouth 2 (two) times daily.      clobetasol 0.05 % external solution   Commonly known as: TEMOVATE   Apply 1 application topically 2 (two) times daily. Applied to sensitive areas for psoriasis.      etanercept 50 MG/ML injection   Commonly known as: ENBREL   Inject 0.49 mLs (25 mg total) into the skin 2 (two) times a week. Given on Tuesdays and Saturdays, for psoriasis.      gabapentin 800 MG tablet   Commonly known as: NEURONTIN   Take 1 tablet (800 mg total) by mouth 3 (three) times daily at 8am, 2pm and bedtime. For anxiety and pain.      lamoTRIgine 150  MG tablet   Commonly known as: LAMICTAL   Take 1 tablet (150 mg total) by mouth 2 (two) times daily in the am and at bedtime.. For mood stability.      lithium carbonate 300 MG capsule   Take 1 capsule (300 mg total) by mouth 3 (three) times daily with meals.      sertraline 50 MG tablet   Commonly known as: ZOLOFT   Take 1 tablet (50 mg total) by mouth daily. For anxiety and depression.      traZODone 50 MG tablet   Commonly known as: DESYREL   Take 1 tablet (50mg ) each morning for anxiety and 2 tablets (100mg ) at bedtime for sleep              Consults:     SIGNIFICANT DIAGNOSTIC STUDIES:  Dg Chest 2 View  12/14/2011  *RADIOLOGY REPORT*  Clinical Data: Altered mental status.  CHEST - 2 VIEW  Comparison: 12/03/2011  Findings: Cardiac enlargement with normal pulmonary vascularity. No focal airspace consolidation in the lungs.  No blunting of costophrenic angles.  No pneumothorax.  Old left rib fracture.  The postoperative changes in both shoulders.  No significant change since previous study.  IMPRESSION: Cardiac enlargement.  No evidence of active pulmonary disease.  Original Report Authenticated By: Marlon Pel, M.D.   Ct Head Wo Contrast  12/14/2011  *RADIOLOGY REPORT*  Clinical Data: Confusion.  CT HEAD WITHOUT CONTRAST  Technique:  Contiguous axial images were obtained from the base of the skull through the vertex without contrast.  Comparison: Head CT 11/29/2011.  Findings: There is no evidence of acute intracranial abnormality including infarction, hemorrhage, mass, mass effect, midline shift or abnormal extra-axial fluid collection.  No hydrocephalus or pneumocephalus.  Calvarium intact.  Imaged paranasal sinuses and mastoid air cells are clear.  Carotid atherosclerosis noted.  IMPRESSION: No acute finding.  Original Report Authenticated By: Bernadene Bell. Maricela Curet, M.D.       Recent Results (from the past 240 hour(s))  MRSA PCR SCREENING     Status: Normal   Collection  Time   12/31/11  9:30 PM      Component Value Range Status Comment   MRSA by PCR NEGATIVE  NEGATIVE  Final     BRIEF ADMITTING H & P: [name] is an 51 y.o. male. Known history of bipolar disorder; home medications do not include opiates nor benzodiazepines; brought in by his brother is concerned because he thought he may have overdosed on a total of 1600 mg of ibuprofen. Patient was noted to be markedly hypotensive, and lethargic in the emergency room. His urine drug screen was positive for benzodiazepines and opiates, And he was sufficiently alert to deny ever using thesse drugs.  He was bolused with 3 L of normal saline, he did not respond to intravenous Narcan and remained hypotensive and the hospitalist service was called to assist.  By the time the hospitalist arrived patient had become even more somnolent and completely unresponsive, but did become arousable after 5 doses of flumazenil. He then admitted that he had been, in his words "lying" and that he had in fact gotten 10 X 0.5 mg tablets of Klonopin off the streets. History of alertness did not last very long, but he denied any other symptomatology; he denied suicidal ideation.    Hospital Course:  Present on Admission:  .Benzodiazepine (tranquilizer) overdose: The patient presented to the emergency room the rather somnolent and obtunded state. Although his historical account is from his inconsistencies appears that the patient did obtain a Klonopin which was on prescribed and took them because he states he likes the feeling that it gives him. The patient was observed on telemetry and at the time of discharge the patient was fully awake without any somnolence. A long discussion was had with the patient about the foolhardy nests of taking benzodiazepines nonprescribed and the risk of respiratory depression up to including death. The patient's wife was present for the discussion and both the patient and his wife have agreed that the  patient will not misuse benzodiazepines any further.   .Lithium toxicity: The patient presented with elevated lithium levels which likely represented an acute lithium toxicity secondary to interaction with other prescribed medications in addition to some renal sufficiency. The patient had concurrent hypotension and first degree AV block likely associated with the lithium. Poison Control Center was consulted with this patient and they recommended observing the patient and reviewing the lithium levels until they're within normal limits. The patient was seen by Dr. Baron Sane from psychiatry who adjusted the patient's lithium levels from her prehospital dose of 600 mg by mouth twice a day down to 300 mg by mouth 3 times a day with meals.I have  discussed his medications with Dr. Ferol Luz and she recommends resuming his other pre-hospital medications including Lamictal,zoloft and trazadone at the previously prescribed doses (she has reviewed D/C plan by Dr. Allena Katz from 12/23/2011).    Marland KitchenBIPOLAR AFFECTIVE DISORDER: The patient has bipolar affective disorder and on the day after admission the patient demonstrated a rather unstable mood. The patient was not medically be stable for discharge and attempting to leave against medical device. As the patient was placed under involuntary commitment for 24 hours. After reconsideration the patient agreed to stay voluntarily. Time of discharge the patient demonstrated stable. The patient was seen by psychiatry who felt that the patient was stable to be discharged home. And he was resumed on his prehospital medications except for the lithium which was prescribed a reduced dose.  .AVB (atrioventricular block: The patient demonstrated a first degree AV block with heart rates ranging from the 60s to 70s. The patient had been on clonidine and atenolol prior to hospitalization. The clonidine was discontinued the atenolol is discontinued and a reduced dose of 25 mg by mouth twice a day.  The patient has been advised to follow up with his primary care physician within 3 days to further assess the need for titrating these medications.   Marland KitchenHEPATITIS C: The patient has a known history of hepatitis C. During his hospitalization he showed no evidence of decompensation of his liver disease. He was however noted that he likely has some decreased clearance of medications secondary to his hepatitis C.   .TOBACCO ABUSE: The patient was counseled against further tobacco use and treated with a NicoDerm transdermal patch during hospitalization.  Marland KitchenHYPERTENSION: The patient was hypotensive on admission to the hospital. He was resuscitated with IV fluids and resumed on atenolol 25 mg twice a day. He will likely need further titration of medications as his blood pressure increases. I will defer to his primary care physician to further titrate his medications.   The patient is very anxious to be discharged home and he was medically stable at the time of discharge.    Disposition and Follow-up: Patient's father is primary care physician Dr. Kathrynn Running in 5 days and followup with Dr. Dub Mikes his psychiatrist as previously scheduled. Discharge Orders    Future Orders Please Complete By Expires   Diet - low sodium heart healthy         DISCHARGE EXAM:  General: Alert, awake, oriented x3, in no acute distress. Very pleasant affect today. Wife present at bedside Vital Signs: Blood pressure 160/91, pulse 63, temperature 98.8 F (37.1 C), temperature source Oral, resp. rate 21, height 5' (1.524 m), weight 71.4 kg (157 lb 6.5 oz), SpO2 98.00%. HEENT: [place]/AT PEERL, EOMI  Neck: Trachea midline, no masses, no thyromegal,y no JVD, no carotid bruit  OROPHARYNX: Moist, No exudate/ erythema/lesions.  Heart: Regular rate and rhythm, without murmurs, rubs, gallops, PMI non-displaced, no heaves or thrills on palpation. Telemetry sinus rhythm with HR 60-70.  Lungs: Clear to auscultation, no wheezing or rhonchi noted. No  increased vocal fremitus resonant to percussion  Abdomen: Soft, nontender, nondistended, positive bowel sounds, no masses no hepatosplenomegaly noted..  Neuro: No focal neurological deficits.  Musculoskeletal: No warm swelling or erythema around joints, no spinal tenderness noted.  Psychiatric: The patient has a normal affect and is cooperative today. He denies and SI or HI    [facility] 01/02/12 0326 12/31/11 1629  NA 140 140  K 4.5 4.6  CL 112 106  CO2 22 26  GLUCOSE 82 118*  BUN 7 12  CREATININE 0.64 0.99  CALCIUM 8.8 9.5  MG -- --  PHOS -- --    Basename 12/31/11 1629  AST 62*  ALT 57*  ALKPHOS 92  BILITOT 0.3  PROT 6.2  ALBUMIN 3.5   No results found for this basename: LIPASE:2,AMYLASE:2 in the last 72 hours  Basename 12/31/11 1629  WBC 3.1*  NEUTROABS --  HGB 11.5*  HCT 34.4*  MCV 105.2*  PLT 103*   Total time for discharge process including face-to-face time approximately 50 minutes Signed: Landis Dowdy A. 01/02/2012, 7:15 PM

## 2012-01-02 NOTE — Progress Notes (Signed)
Patient's vital signs stable throughout the night, although still in 1 AV block. Hr maintaing in the 60s. Patient's behavior was calm consistently throughout the night.

## 2012-06-22 ENCOUNTER — Encounter (HOSPITAL_COMMUNITY): Payer: Self-pay | Admitting: *Deleted

## 2012-06-22 ENCOUNTER — Emergency Department (HOSPITAL_COMMUNITY)
Admission: EM | Admit: 2012-06-22 | Discharge: 2012-06-23 | Disposition: A | Discharge status: Home / Self Care | Payer: Medicare Other | Attending: Emergency Medicine | Admitting: Emergency Medicine

## 2012-06-22 ENCOUNTER — Other Ambulatory Visit: Payer: Self-pay

## 2012-06-22 DIAGNOSIS — F319 Bipolar disorder, unspecified: Secondary | ICD-10-CM | POA: Insufficient documentation

## 2012-06-22 DIAGNOSIS — R51 Headache: Secondary | ICD-10-CM | POA: Insufficient documentation

## 2012-06-22 DIAGNOSIS — R231 Pallor: Secondary | ICD-10-CM | POA: Insufficient documentation

## 2012-06-22 DIAGNOSIS — N19 Unspecified kidney failure: Secondary | ICD-10-CM | POA: Insufficient documentation

## 2012-06-22 DIAGNOSIS — Z8669 Personal history of other diseases of the nervous system and sense organs: Secondary | ICD-10-CM | POA: Insufficient documentation

## 2012-06-22 DIAGNOSIS — F17200 Nicotine dependence, unspecified, uncomplicated: Secondary | ICD-10-CM | POA: Insufficient documentation

## 2012-06-22 DIAGNOSIS — I10 Essential (primary) hypertension: Secondary | ICD-10-CM | POA: Insufficient documentation

## 2012-06-22 DIAGNOSIS — Z79899 Other long term (current) drug therapy: Secondary | ICD-10-CM | POA: Insufficient documentation

## 2012-06-22 LAB — COMPREHENSIVE METABOLIC PANEL
AST: 114 U/L — ABNORMAL HIGH (ref 0–37)
Albumin: 4.2 g/dL (ref 3.5–5.2)
Chloride: 99 mEq/L (ref 96–112)
Creatinine, Ser: 0.71 mg/dL (ref 0.50–1.35)
Potassium: 4.2 mEq/L (ref 3.5–5.1)
Total Bilirubin: 0.6 mg/dL (ref 0.3–1.2)
Total Protein: 7.9 g/dL (ref 6.0–8.3)

## 2012-06-22 LAB — CBC WITH DIFFERENTIAL/PLATELET
Basophils Absolute: 0 10*3/uL (ref 0.0–0.1)
Basophils Relative: 1 % (ref 0–1)
MCHC: 34.3 g/dL (ref 30.0–36.0)
Monocytes Absolute: 0.4 10*3/uL (ref 0.1–1.0)
Neutro Abs: 2.4 10*3/uL (ref 1.7–7.7)
Neutrophils Relative %: 62 % (ref 43–77)
RDW: 13.5 % (ref 11.5–15.5)

## 2012-06-22 LAB — URINALYSIS, ROUTINE W REFLEX MICROSCOPIC
Ketones, ur: NEGATIVE mg/dL
Leukocytes, UA: NEGATIVE
Nitrite: NEGATIVE
Protein, ur: NEGATIVE mg/dL

## 2012-06-22 LAB — ETHANOL: Alcohol, Ethyl (B): <11 mg/dL (ref 0–11)

## 2012-06-22 MED ORDER — SODIUM CHLORIDE 0.9 % IV BOLUS (SEPSIS)
1000.0000 mL | Freq: Once | INTRAVENOUS | Status: AC
  Administered 2012-06-22: 1000 mL via INTRAVENOUS

## 2012-06-22 NOTE — ED Provider Notes (Signed)
History     CSN: [id number]  Arrival date & time 06/22/12  1652   First MD Initiated Contact with Patient 06/22/12 2314      Chief Complaint  Patient presents with  . Altered Mental Status    (Consider location/radiation/quality/duration/timing/severity/associated sxs/prior treatment) HPI Comments: Is a 51 year old gentleman, who takes lithium and feels, he may be toxic.  Because of confusion and right-sided frontal headache.  He states he does not normally get headaches, but he's been feeling lethargic, confused, with increased headache, for the past, week.  He has not contacted his primary care physician nor decreased or increased his lithium dose.  Patient is a 51 y.o. male presenting with altered mental status. The history is provided by the patient.  Altered Mental Status This is a recurrent problem. The current episode started in the past 7 days. The problem occurs constantly. The problem has been gradually worsening. Associated symptoms include headaches. Pertinent negatives include no abdominal pain, arthralgias, change in bowel habit, fever, myalgias, nausea, neck pain, rash, sore throat or weakness.    Past Medical History  Diagnosis Date  . Tachycardia   . Peripheral neuropathy   . Alcohol abuse     hx of quit 4/04  . Bilateral external ear infections 2007  . Tobacco abuse   . Bipolar affective disorder     Followed by Dr. [name]  . Avascular necrosis     SP right and left hip replacements and L shoulder arthroplasty, secondary to steroid use.  Has been on embrel in past.  . Psoriasis     Previously on embrel, stopped because of price.   . Hypertension   . Hepatitis C   . ARF (acute renal failure) 2008    Multifactorial in setting of over use of ibuprofen and HCTZ./ACEI  . Cirrhosis   . Back pain     Past Surgical History  Procedure Date  . Right total hip replacement 2004    Due to AVN  . Left total hip replacement 2001    Due to AVN  . Closed  manipulation, pinning, application of a traction pin right 2002  . Right shoulder arthroplasty     Due to AVN  . Appendectomy 1989    Family History  Problem Relation Age of Onset  . Hypertension Mother   . Bipolar disorder Father   . Obesity Father   . Obesity Brother     History  Substance Use Topics  . Smoking status: Current Every Day Smoker -- 1.0 packs/day for 35 years    Types: Cigarettes  . Smokeless tobacco: Never Used     Comment: Not interested in stopping.  . Alcohol Use: No     Comment: Previously alcoholic, denies ETOH for "past few weeks"      Review of Systems  Constitutional: Negative for fever and activity change.  HENT: Negative for sore throat, rhinorrhea and neck pain.   Gastrointestinal: Negative for nausea, abdominal pain and change in bowel habit.  Musculoskeletal: Negative for myalgias and arthralgias.  Skin: Positive for pallor. Negative for rash.  Neurological: Positive for headaches. Negative for dizziness and weakness.  Psychiatric/Behavioral: Positive for altered mental status. The patient is nervous/anxious.     Allergies  Erythromycin; Fentanyl citrate; Hydromorphone hcl; and Prochlorperazine maleate  Home Medications   Current Outpatient Rx  Name  Route  Sig  Dispense  Refill  . AMLODIPINE BESYLATE 5 MG PO TABS   Oral   Take 1 tablet (5 mg total)  by mouth daily. For hpertension.   30 tablet   0   . ATENOLOL 25 MG PO TABS   Oral   Take 1 tablet (25 mg total) by mouth 2 (two) times daily.   60 tablet   0   . ETANERCEPT 50 MG/ML [place] SOLN   Subcutaneous   Inject 0.49 mLs (25 mg total) into the skin 2 (two) times a week. Given on Tuesdays and Saturdays, for psoriasis.   0.98 mL   0   . GABAPENTIN 800 MG PO TABS   Oral   Take 1 tablet (800 mg total) by mouth 3 (three) times daily at 8am, 2pm and bedtime. For anxiety and pain.   90 tablet   0   . LAMOTRIGINE 150 MG PO TABS   Oral   Take 150-300 mg by mouth 2 (two) times  daily. Take one tablet in the morning and two at night         . LITHIUM CARBONATE 300 MG PO CAPS   Oral   Take 300-600 mg by mouth 2 (two) times daily with a meal. Take one tablet in the morning and two tablets at nights         . SERTRALINE HCL 50 MG PO TABS   Oral   Take 1 tablet (50 mg total) by mouth daily. For anxiety and depression.   30 tablet   0   . TRAZODONE HCL 100 MG PO TABS   Oral   Take 200 mg by mouth at bedtime.           BP 171/109  Pulse 84  Temp 99 F (37.2 C) (Oral)  Resp 18  SpO2 100%  Physical Exam  Constitutional: He is oriented to person, place, and time. He appears well-developed and well-nourished.  HENT:  Head: Normocephalic.  Eyes: Pupils are equal, round, and reactive to light.  Cardiovascular: Normal rate.   Pulmonary/Chest: Effort normal.  Abdominal: Soft.  Musculoskeletal: Normal range of motion.  Neurological: He is alert and oriented to person, place, and time.       Sticking and logical sentences, but slightly delayed in response, also, lying with eyes closed  Skin: Skin is warm. There is pallor.    ED Course  Procedures (including critical care time)  Labs Reviewed  CBC WITH DIFFERENTIAL - Abnormal; Notable for the following:    WBC 3.9 (*)     MCV 106.0 (*)     MCH 36.4 (*)     Platelets 120 (*)     All other components within normal limits  COMPREHENSIVE METABOLIC PANEL - Abnormal; Notable for the following:    Sodium 134 (*)     Calcium 11.1 (*)     AST 114 (*)     ALT 107 (*)     All other components within normal limits  URINALYSIS, ROUTINE W REFLEX MICROSCOPIC  AMMONIA  ETHANOL  LITHIUM LEVEL   Ct Head Wo Contrast  06/23/2012  *RADIOLOGY REPORT*  Clinical Data: Altered mental status, tremors, forgetfulness, rapid speech, headache, confusion, history hypertension, cirrhosis, smoking  CT HEAD WITHOUT CONTRAST  Technique:  Contiguous axial images were obtained from the base of the skull through the vertex  without contrast.  Comparison: 12/14/2011  Findings: Mild motion artifacts, for which repeat imaging was performed. Generalized atrophy. Stable ventricular morphology. No midline shift or mass effect. Otherwise normal appearance brain parenchyma. No intracranial hemorrhage, mass lesion or evidence of acute infarction. No extra-axial fluid collections. Atherosclerotic  calcification of internal carotid arteries at skull base. Bones and sinuses unremarkable.  IMPRESSION: Generalized atrophy. No acute intracranial abnormalities.   Original Report Authenticated By: Ulyses Southward, M.D.      1. Headache       MDM   Patient.  Exhibits drug-seeking behavior I feel that this is most likely a complicated migraine .  His lithium level is well within normal parameters        Arman Filter, NP 06/24/12 0022

## 2012-06-22 NOTE — ED Notes (Signed)
Pt. Up to nurse desk. Stating frustration about care. States "It is ridiculous that they didn't draw my lithium level when I first got here. I almost died from it once". Pt and wife informed about wait times and triage process.

## 2012-06-22 NOTE — ED Notes (Signed)
Per patient and EMS pt has caught self doing things that is out of the ordinary for him over the last week.  Pt reports forgetful, and tremors.  Denies recent ETOH--stopped 1986.  PT has rapid speech.  CBG-91.  Pt states last time he had these symptoms he was Lithium toxic.  bp 150/110, p-120

## 2012-06-23 ENCOUNTER — Emergency Department (HOSPITAL_COMMUNITY): Payer: Medicare Other

## 2012-06-23 MED ORDER — KETOROLAC TROMETHAMINE 30 MG/ML IJ SOLN
30.0000 mg | Freq: Once | INTRAMUSCULAR | Status: AC
  Administered 2012-06-23: 30 mg via INTRAVENOUS
  Filled 2012-06-23: qty 1

## 2012-06-24 NOTE — ED Provider Notes (Signed)
Medical screening examination/treatment/procedure(s) were performed by non-physician practitioner and as supervising physician I was immediately available for consultation/collaboration.  Kimara Bencomo Lytle Michaels, MD 06/24/12 [phone]

## 2013-02-22 ENCOUNTER — Ambulatory Visit
Admission: RE | Admit: 2013-02-22 | Discharge: 2013-02-22 | Disposition: A | Discharge status: Home / Self Care | Payer: Medicare PPO | Source: Ambulatory Visit | Attending: Neurosurgery | Admitting: Neurosurgery

## 2013-02-22 ENCOUNTER — Other Ambulatory Visit: Payer: Self-pay | Admitting: Neurosurgery

## 2013-02-22 DIAGNOSIS — M5412 Radiculopathy, cervical region: Secondary | ICD-10-CM

## 2013-02-23 ENCOUNTER — Other Ambulatory Visit: Payer: Self-pay | Admitting: Neurosurgery

## 2013-02-24 ENCOUNTER — Encounter (HOSPITAL_COMMUNITY): Payer: Self-pay | Admitting: Pharmacist

## 2013-03-05 ENCOUNTER — Ambulatory Visit (HOSPITAL_COMMUNITY)
Admission: RE | Admit: 2013-03-05 | Discharge: 2013-03-05 | Disposition: A | Discharge status: Home / Self Care | Payer: Medicare PPO | Source: Ambulatory Visit | Attending: Anesthesiology | Admitting: Anesthesiology

## 2013-03-05 ENCOUNTER — Encounter (HOSPITAL_COMMUNITY): Payer: Self-pay

## 2013-03-05 ENCOUNTER — Encounter (HOSPITAL_COMMUNITY)
Admission: RE | Admit: 2013-03-05 | Discharge: 2013-03-05 | Disposition: A | Discharge status: Home / Self Care | Payer: Medicare PPO | Source: Ambulatory Visit | Attending: Neurosurgery | Admitting: Neurosurgery

## 2013-03-05 DIAGNOSIS — Z01810 Encounter for preprocedural cardiovascular examination: Secondary | ICD-10-CM | POA: Insufficient documentation

## 2013-03-05 DIAGNOSIS — I517 Cardiomegaly: Secondary | ICD-10-CM | POA: Insufficient documentation

## 2013-03-05 DIAGNOSIS — I4460 Unspecified fascicular block: Secondary | ICD-10-CM | POA: Insufficient documentation

## 2013-03-05 DIAGNOSIS — I498 Other specified cardiac arrhythmias: Secondary | ICD-10-CM | POA: Insufficient documentation

## 2013-03-05 DIAGNOSIS — Z01818 Encounter for other preprocedural examination: Secondary | ICD-10-CM | POA: Insufficient documentation

## 2013-03-05 DIAGNOSIS — Z01812 Encounter for preprocedural laboratory examination: Secondary | ICD-10-CM | POA: Insufficient documentation

## 2013-03-05 DIAGNOSIS — I440 Atrioventricular block, first degree: Secondary | ICD-10-CM | POA: Insufficient documentation

## 2013-03-05 HISTORY — DX: Pneumonia, unspecified organism: J18.9

## 2013-03-05 HISTORY — DX: Cardiac murmur, unspecified: R01.1

## 2013-03-05 HISTORY — DX: Cerebral infarction, unspecified: I63.9

## 2013-03-05 HISTORY — DX: Gastro-esophageal reflux disease without esophagitis: K21.9

## 2013-03-05 HISTORY — DX: Unspecified osteoarthritis, unspecified site: M19.90

## 2013-03-05 LAB — CBC
MCH: 35.6 pg — ABNORMAL HIGH (ref 26.0–34.0)
Platelets: 99 10*3/uL — ABNORMAL LOW (ref 150–400)
RBC: 4.44 MIL/uL (ref 4.22–5.81)
RDW: 12.5 % (ref 11.5–15.5)
WBC: 4.5 10*3/uL (ref 4.0–10.5)

## 2013-03-05 LAB — HEPATIC FUNCTION PANEL
ALT: 53 U/L (ref 0–53)
AST: 48 U/L — ABNORMAL HIGH (ref 0–37)
Total Protein: 7.6 g/dL (ref 6.0–8.3)

## 2013-03-05 LAB — BASIC METABOLIC PANEL
Calcium: 10.9 mg/dL — ABNORMAL HIGH (ref 8.4–10.5)
Creatinine, Ser: 0.83 mg/dL (ref 0.50–1.35)
GFR calc non Af Amer: >90 mL/min (ref >90)
Sodium: 138 mEq/L (ref 135–145)

## 2013-03-05 LAB — SURGICAL PCR SCREEN: Staphylococcus aureus: NEGATIVE

## 2013-03-05 NOTE — Pre-Procedure Instructions (Addendum)
[name]  03/05/2013   Your procedure is scheduled on:  03/10/13  Report to [facility] at 1000 AM.  Call this number if you have problems the morning of surgery: [phone]   Remember:   Do not eat food or drink liquids after midnight.   Take these medicines the morning of surgery with A SIP OF WATER: amlodipine, clonidine, gabapentin, zoloft, lithium   Do not wear jewelry, make-up or nail polish.  Do not wear lotions, powders, or perfumes. You may wear deodorant.  Do not shave 48 hours prior to surgery. Men may shave face and neck.  Do not bring valuables to the hospital.  [facility] is not responsible                   for any belongings or valuables.  Contacts, dentures or bridgework may not be worn into surgery.  Leave suitcase in the car. After surgery it may be brought to your room.  For patients admitted to the hospital, checkout time is 11:00 AM the day of  discharge.   Patients discharged the day of surgery will not be allowed to drive  home.  Name and phone number of your driver:  Special Instructions: Shower using CHG 2 nights before surgery and the night before surgery.  If you shower the day of surgery use CHG.  Use special wash - you have one bottle of CHG for all showers.  You should use approximately 1/3 of the bottle for each shower.   Please read over the following fact sheets that you were given: Pain Booklet, Coughing and Deep Breathing, MRSA Information and Surgical Site Infection Prevention

## 2013-03-08 NOTE — Progress Notes (Signed)
Anesthesia chart review: Patient is a 52 year old male scheduled for C3-4, C4-5, C5-6 ACDF by Dr. Newell Coral on 03/10/2013. History includes smoking, Hepatitis C and ETOH abuse (sober since 2004) with history of cirrhosis, peripheral neuropathy, psoriasis, acute renal failure '08, GERD, arthritis, Bipolar affective disorder, HTN, heart murmur (not specified), TIA '11, AVN with history of bilateral THR, bilateral shoulder arthroplasty, appendectomy, tonsillectomy, hernia repair. PCP is listed as Dr. Aleatha Borer [phone]), records requested but are still pending.  EKG on 03/05/13 showed SR with sinus arrhythmia, first degree AV block, occasional PVC, possible left atrial enlargement, left anterior fascicular block. PVCs are new, but he has had LAE, LAFB and intermittent first degree AVB since at least 04/2010 (see Muse).  Echo on 05/01/10 showed: - Left ventricle: The cavity size was normal. There was mild concentric hypertrophy. Systolic function was normal. The estimated ejection fraction was in the range of 55% to 60%. Wall motion was normal; there were no regional wall motion abnormalities. Doppler parameters are consistent with abnormal left ventricular relaxation (grade 1 diastolic dysfunction). - Aortic valve: Trileaflet; mildly thickened leaflets. There was no stenosis. Mild regurgitation. - Atrial septum: No defect or patent foramen ovale was identified. - Tricuspid valve: Mild regurgitation.  CXR on 03/05/13 showed no acute cardiopulmonary disease identified.  Preoperative labs noted. Cr 0.83.  AST mildly elevated at 48. ALT and H/H WNL. PLT count decreased at 99K (PLT have been 65-120K since 04/2010).  Patient has known Hepatitis C and reported cirrhosis.  PLT results called to Darl Pikes at Dr. Earl Gala office.  His LFTs and PLT count appear stable.  I will defer additional orders, if any, to Dr. Kizzie Ide.     Velna Ochs [facility]/Anesthesiology Phone [phone] 03/08/2013 11:29 AM

## 2013-03-09 MED ORDER — CEFAZOLIN SODIUM-DEXTROSE 2-3 GM-% IV SOLR
2.0000 g | INTRAVENOUS | Status: AC
  Administered 2013-03-10 (×2): 2 g via INTRAVENOUS

## 2013-03-10 ENCOUNTER — Inpatient Hospital Stay (HOSPITAL_COMMUNITY): Payer: Medicare PPO

## 2013-03-10 ENCOUNTER — Inpatient Hospital Stay (HOSPITAL_COMMUNITY)
Admission: RE | Admit: 2013-03-10 | Discharge: 2013-03-11 | DRG: 473 | Disposition: A | Discharge status: Home / Self Care | Payer: Medicare PPO | Source: Ambulatory Visit | Attending: Neurosurgery | Admitting: Neurosurgery

## 2013-03-10 ENCOUNTER — Inpatient Hospital Stay (HOSPITAL_COMMUNITY): Payer: Medicare PPO | Admitting: Anesthesiology

## 2013-03-10 ENCOUNTER — Encounter (HOSPITAL_COMMUNITY)
Admission: RE | Disposition: A | Discharge status: Home / Self Care | Payer: Self-pay | Source: Ambulatory Visit | Attending: Neurosurgery

## 2013-03-10 ENCOUNTER — Encounter (HOSPITAL_COMMUNITY): Payer: Self-pay | Admitting: Vascular Surgery

## 2013-03-10 ENCOUNTER — Encounter (HOSPITAL_COMMUNITY): Payer: Self-pay | Admitting: Anesthesiology

## 2013-03-10 DIAGNOSIS — F17200 Nicotine dependence, unspecified, uncomplicated: Secondary | ICD-10-CM | POA: Diagnosis present

## 2013-03-10 DIAGNOSIS — M47812 Spondylosis without myelopathy or radiculopathy, cervical region: Principal | ICD-10-CM | POA: Diagnosis present

## 2013-03-10 DIAGNOSIS — I10 Essential (primary) hypertension: Secondary | ICD-10-CM | POA: Diagnosis present

## 2013-03-10 DIAGNOSIS — Z96619 Presence of unspecified artificial shoulder joint: Secondary | ICD-10-CM

## 2013-03-10 DIAGNOSIS — Z8673 Personal history of transient ischemic attack (TIA), and cerebral infarction without residual deficits: Secondary | ICD-10-CM

## 2013-03-10 DIAGNOSIS — Z96649 Presence of unspecified artificial hip joint: Secondary | ICD-10-CM

## 2013-03-10 DIAGNOSIS — M129 Arthropathy, unspecified: Secondary | ICD-10-CM | POA: Diagnosis present

## 2013-03-10 DIAGNOSIS — K219 Gastro-esophageal reflux disease without esophagitis: Secondary | ICD-10-CM | POA: Diagnosis present

## 2013-03-10 DIAGNOSIS — G609 Hereditary and idiopathic neuropathy, unspecified: Secondary | ICD-10-CM | POA: Diagnosis present

## 2013-03-10 HISTORY — PX: ANTERIOR FUSION CERVICAL SPINE: SUR626

## 2013-03-10 HISTORY — PX: ANTERIOR CERVICAL DECOMP/DISCECTOMY FUSION: SHX1161

## 2013-03-10 SURGERY — ANTERIOR CERVICAL DECOMPRESSION/DISCECTOMY FUSION 3 LEVELS
Anesthesia: General | Site: Neck | Wound class: Clean

## 2013-03-10 MED ORDER — SODIUM CHLORIDE 0.9 % IJ SOLN: 3.0000 mL | INTRAMUSCULAR | Status: DC | PRN

## 2013-03-10 MED ORDER — CEFAZOLIN SODIUM-DEXTROSE 2-3 GM-% IV SOLR
INTRAVENOUS | Status: AC
  Filled 2013-03-10: qty 50

## 2013-03-10 MED ORDER — ONDANSETRON HCL 4 MG/2ML IJ SOLN: 4.0000 mg | Freq: Four times a day (QID) | INTRAMUSCULAR | Status: DC | PRN

## 2013-03-10 MED ORDER — CLOBETASOL PROPIONATE 0.05 % EX OINT
1.0000 "application " | TOPICAL_OINTMENT | Freq: Every day | CUTANEOUS | Status: DC | PRN
  Filled 2013-03-10: qty 15

## 2013-03-10 MED ORDER — ONDANSETRON HCL 4 MG/2ML IJ SOLN
INTRAMUSCULAR | Status: DC | PRN
  Administered 2013-03-10: 4 mg via INTRAVENOUS

## 2013-03-10 MED ORDER — GABAPENTIN 400 MG PO CAPS
800.0000 mg | ORAL_CAPSULE | Freq: Three times a day (TID) | ORAL | Status: DC
  Administered 2013-03-10 – 2013-03-11 (×2): 800 mg via ORAL
  Filled 2013-03-10 (×4): qty 2

## 2013-03-10 MED ORDER — SODIUM CHLORIDE 0.9 % IJ SOLN
3.0000 mL | Freq: Two times a day (BID) | INTRAMUSCULAR | Status: DC
  Administered 2013-03-10: 3 mL via INTRAVENOUS

## 2013-03-10 MED ORDER — SODIUM CHLORIDE 0.9 % IV SOLN
INTRAVENOUS | Status: AC
  Filled 2013-03-10: qty 500

## 2013-03-10 MED ORDER — BUPIVACAINE HCL (PF) 0.5 % IJ SOLN
INTRAMUSCULAR | Status: DC | PRN
  Administered 2013-03-10: 6.5 mL

## 2013-03-10 MED ORDER — HYDROCODONE-ACETAMINOPHEN 5-325 MG PO TABS
1.0000 | ORAL_TABLET | ORAL | Status: DC | PRN
  Administered 2013-03-11: 2 via ORAL
  Filled 2013-03-10: qty 2

## 2013-03-10 MED ORDER — BACITRACIN 50000 UNITS IM SOLR
INTRAMUSCULAR | Status: AC
  Filled 2013-03-10: qty 1

## 2013-03-10 MED ORDER — HYDROMORPHONE HCL PF 1 MG/ML IJ SOLN: 0.2500 mg | INTRAMUSCULAR | Status: DC | PRN

## 2013-03-10 MED ORDER — DEXTROSE-NACL 5-0.45 % IV SOLN
INTRAVENOUS | Status: DC
  Administered 2013-03-10 – 2013-03-11 (×2): via INTRAVENOUS

## 2013-03-10 MED ORDER — LITHIUM CARBONATE 300 MG PO CAPS
300.0000 mg | ORAL_CAPSULE | Freq: Every day | ORAL | Status: DC
  Administered 2013-03-11: 300 mg via ORAL
  Filled 2013-03-10 (×2): qty 1

## 2013-03-10 MED ORDER — QUETIAPINE FUMARATE 200 MG PO TABS
200.0000 mg | ORAL_TABLET | Freq: Every evening | ORAL | Status: DC | PRN
  Administered 2013-03-10: 200 mg via ORAL
  Filled 2013-03-10: qty 1

## 2013-03-10 MED ORDER — SODIUM CHLORIDE 0.9 % IV SOLN
INTRAVENOUS | Status: DC | PRN
  Administered 2013-03-10: 15:00:00 via INTRAVENOUS

## 2013-03-10 MED ORDER — MAGNESIUM HYDROXIDE 400 MG/5ML PO SUSP: 30.0000 mL | Freq: Every day | ORAL | Status: DC | PRN

## 2013-03-10 MED ORDER — ZOLPIDEM TARTRATE 5 MG PO TABS: 5.0000 mg | ORAL_TABLET | Freq: Every evening | ORAL | Status: DC | PRN

## 2013-03-10 MED ORDER — FENTANYL CITRATE 0.05 MG/ML IJ SOLN
INTRAMUSCULAR | Status: AC
  Administered 2013-03-10: 50 ug
  Filled 2013-03-10: qty 2

## 2013-03-10 MED ORDER — MENTHOL 3 MG MT LOZG: 1.0000 | LOZENGE | OROMUCOSAL | Status: DC | PRN

## 2013-03-10 MED ORDER — TRAZODONE HCL 100 MG PO TABS
200.0000 mg | ORAL_TABLET | Freq: Every day | ORAL | Status: DC
  Administered 2013-03-10: 200 mg via ORAL
  Filled 2013-03-10 (×2): qty 2

## 2013-03-10 MED ORDER — ACETAMINOPHEN 325 MG PO TABS: 650.0000 mg | ORAL_TABLET | ORAL | Status: DC | PRN

## 2013-03-10 MED ORDER — MORPHINE SULFATE 4 MG/ML IJ SOLN
4.0000 mg | INTRAMUSCULAR | Status: DC | PRN
  Administered 2013-03-10 (×2): 4 mg via INTRAMUSCULAR
  Filled 2013-03-10 (×2): qty 1

## 2013-03-10 MED ORDER — FENTANYL CITRATE 0.05 MG/ML IJ SOLN
INTRAMUSCULAR | Status: DC | PRN
  Administered 2013-03-10 (×5): 50 ug via INTRAVENOUS
  Administered 2013-03-10: 100 ug via INTRAVENOUS
  Administered 2013-03-10: 50 ug via INTRAVENOUS

## 2013-03-10 MED ORDER — LIDOCAINE HCL (CARDIAC) 20 MG/ML IV SOLN
INTRAVENOUS | Status: DC | PRN
  Administered 2013-03-10: 60 mg via INTRAVENOUS

## 2013-03-10 MED ORDER — LIDOCAINE HCL 4 % MT SOLN
OROMUCOSAL | Status: DC | PRN
  Administered 2013-03-10: 4 mL via TOPICAL

## 2013-03-10 MED ORDER — FENTANYL CITRATE 0.05 MG/ML IJ SOLN: 50.0000 ug | Freq: Once | INTRAMUSCULAR | Status: DC

## 2013-03-10 MED ORDER — 0.9 % SODIUM CHLORIDE (POUR BTL) OPTIME
TOPICAL | Status: DC | PRN
  Administered 2013-03-10: 1000 mL

## 2013-03-10 MED ORDER — THROMBIN 5000 UNITS EX SOLR
OROMUCOSAL | Status: DC | PRN
  Administered 2013-03-10: 15:00:00 via TOPICAL

## 2013-03-10 MED ORDER — OXYCODONE-ACETAMINOPHEN 5-325 MG PO TABS
1.0000 | ORAL_TABLET | ORAL | Status: DC | PRN
  Administered 2013-03-10 – 2013-03-11 (×2): 2 via ORAL
  Filled 2013-03-10 (×3): qty 2

## 2013-03-10 MED ORDER — SODIUM CHLORIDE 0.9 % IR SOLN
Status: DC | PRN
  Administered 2013-03-10: 14:00:00

## 2013-03-10 MED ORDER — AMLODIPINE BESYLATE 10 MG PO TABS
10.0000 mg | ORAL_TABLET | Freq: Every day | ORAL | Status: DC
  Administered 2013-03-11: 10 mg via ORAL
  Filled 2013-03-10: qty 1

## 2013-03-10 MED ORDER — DEXAMETHASONE SODIUM PHOSPHATE 4 MG/ML IJ SOLN
INTRAMUSCULAR | Status: DC | PRN
  Administered 2013-03-10: 8 mg via INTRAVENOUS

## 2013-03-10 MED ORDER — LACTATED RINGERS IV SOLN
INTRAVENOUS | Status: DC | PRN
  Administered 2013-03-10 (×2): via INTRAVENOUS

## 2013-03-10 MED ORDER — PROPOFOL 10 MG/ML IV BOLUS
INTRAVENOUS | Status: DC | PRN
  Administered 2013-03-10: 300 mg via INTRAVENOUS

## 2013-03-10 MED ORDER — HYDROXYZINE HCL 25 MG PO TABS: 50.0000 mg | ORAL_TABLET | ORAL | Status: DC | PRN

## 2013-03-10 MED ORDER — PHENYLEPHRINE HCL 10 MG/ML IJ SOLN
INTRAMUSCULAR | Status: DC | PRN
  Administered 2013-03-10 (×3): 80 ug via INTRAVENOUS

## 2013-03-10 MED ORDER — ONDANSETRON HCL 4 MG/2ML IJ SOLN: 4.0000 mg | Freq: Once | INTRAMUSCULAR | Status: DC | PRN

## 2013-03-10 MED ORDER — THROMBIN 20000 UNITS EX SOLR
CUTANEOUS | Status: DC | PRN
  Administered 2013-03-10: 14:00:00 via TOPICAL

## 2013-03-10 MED ORDER — ALUM & MAG HYDROXIDE-SIMETH 200-200-20 MG/5ML PO SUSP: 30.0000 mL | Freq: Four times a day (QID) | ORAL | Status: DC | PRN

## 2013-03-10 MED ORDER — KETOROLAC TROMETHAMINE 30 MG/ML IJ SOLN
INTRAMUSCULAR | Status: AC
  Filled 2013-03-10: qty 1

## 2013-03-10 MED ORDER — GABAPENTIN 800 MG PO TABS
800.0000 mg | ORAL_TABLET | Freq: Three times a day (TID) | ORAL | Status: DC
  Filled 2013-03-10: qty 1

## 2013-03-10 MED ORDER — LIDOCAINE-EPINEPHRINE 1 %-1:100000 IJ SOLN
INTRAMUSCULAR | Status: DC | PRN
  Administered 2013-03-10: 6.5 mL

## 2013-03-10 MED ORDER — SERTRALINE HCL 100 MG PO TABS
100.0000 mg | ORAL_TABLET | Freq: Every day | ORAL | Status: DC
  Administered 2013-03-11: 100 mg via ORAL
  Filled 2013-03-10: qty 1

## 2013-03-10 MED ORDER — ACETAMINOPHEN 650 MG RE SUPP: 650.0000 mg | RECTAL | Status: DC | PRN

## 2013-03-10 MED ORDER — ARTIFICIAL TEARS OP OINT
TOPICAL_OINTMENT | OPHTHALMIC | Status: DC | PRN
  Administered 2013-03-10: 1 via OPHTHALMIC

## 2013-03-10 MED ORDER — LITHIUM CARBONATE 300 MG PO CAPS
600.0000 mg | ORAL_CAPSULE | Freq: Every day | ORAL | Status: DC
  Administered 2013-03-10: 600 mg via ORAL
  Filled 2013-03-10 (×2): qty 2

## 2013-03-10 MED ORDER — FENTANYL CITRATE 0.05 MG/ML IJ SOLN
25.0000 ug | INTRAMUSCULAR | Status: DC | PRN
  Administered 2013-03-10: 50 ug via INTRAVENOUS

## 2013-03-10 MED ORDER — ROCURONIUM BROMIDE 100 MG/10ML IV SOLN
INTRAVENOUS | Status: DC | PRN
  Administered 2013-03-10: 10 mg via INTRAVENOUS
  Administered 2013-03-10: 20 mg via INTRAVENOUS
  Administered 2013-03-10 (×2): 10 mg via INTRAVENOUS
  Administered 2013-03-10: 50 mg via INTRAVENOUS

## 2013-03-10 MED ORDER — KETOROLAC TROMETHAMINE 30 MG/ML IJ SOLN
30.0000 mg | Freq: Four times a day (QID) | INTRAMUSCULAR | Status: DC
  Administered 2013-03-11 (×3): 30 mg via INTRAVENOUS
  Filled 2013-03-10 (×6): qty 1

## 2013-03-10 MED ORDER — BISACODYL 10 MG RE SUPP: 10.0000 mg | Freq: Every day | RECTAL | Status: DC | PRN

## 2013-03-10 MED ORDER — GLYCOPYRROLATE 0.2 MG/ML IJ SOLN
INTRAMUSCULAR | Status: DC | PRN
Start: 2013-03-10 — End: 2013-03-10
  Administered 2013-03-10: 0.6 mg via INTRAVENOUS

## 2013-03-10 MED ORDER — KETOROLAC TROMETHAMINE 30 MG/ML IJ SOLN
30.0000 mg | Freq: Once | INTRAMUSCULAR | Status: AC
  Administered 2013-03-10: 30 mg via INTRAVENOUS

## 2013-03-10 MED ORDER — NEOSTIGMINE METHYLSULFATE 1 MG/ML IJ SOLN
INTRAMUSCULAR | Status: DC | PRN
  Administered 2013-03-10: 4 mg via INTRAVENOUS

## 2013-03-10 MED ORDER — MIDAZOLAM HCL 5 MG/5ML IJ SOLN
INTRAMUSCULAR | Status: DC | PRN
  Administered 2013-03-10: 2 mg via INTRAVENOUS

## 2013-03-10 MED ORDER — LACTATED RINGERS IV SOLN
INTRAVENOUS | Status: DC
  Administered 2013-03-10 – 2013-03-11 (×2): via INTRAVENOUS

## 2013-03-10 MED ORDER — PHENOL 1.4 % MT LIQD: 1.0000 | OROMUCOSAL | Status: DC | PRN

## 2013-03-10 MED ORDER — CYCLOBENZAPRINE HCL 10 MG PO TABS
10.0000 mg | ORAL_TABLET | Freq: Three times a day (TID) | ORAL | Status: DC | PRN
  Administered 2013-03-10: 10 mg via ORAL
  Filled 2013-03-10: qty 1

## 2013-03-10 MED ORDER — CLONIDINE HCL 0.3 MG PO TABS
0.3000 mg | ORAL_TABLET | Freq: Two times a day (BID) | ORAL | Status: DC
  Administered 2013-03-10 – 2013-03-11 (×2): 0.3 mg via ORAL
  Filled 2013-03-10 (×3): qty 1

## 2013-03-10 MED ORDER — ALBUMIN HUMAN 5 % IV SOLN
INTRAVENOUS | Status: DC | PRN
  Administered 2013-03-10 (×2): via INTRAVENOUS

## 2013-03-10 SURGICAL SUPPLY — 66 items
ADH SKN CLS APL DERMABOND .7 (GAUZE/BANDAGES/DRESSINGS) ×1
ALLOGRAFT CA 6X14X11 (Bone Implant) ×4 IMPLANT
BAG DECANTER FOR FLEXI CONT (MISCELLANEOUS) ×2 IMPLANT
BIT DRILL NEURO 2X3.1 SFT TUCH (MISCELLANEOUS) ×1 IMPLANT
BLADE ULTRA TIP 2M (BLADE) ×2 IMPLANT
BRUSH SCRUB EZ PLAIN DRY (MISCELLANEOUS) ×2 IMPLANT
CANISTER SUCTION 2500CC (MISCELLANEOUS) ×2 IMPLANT
CLOTH BEACON ORANGE TIMEOUT ST (SAFETY) ×2 IMPLANT
CONT SPEC 4OZ CLIKSEAL STRL BL (MISCELLANEOUS) ×2 IMPLANT
COVER MAYO STAND STRL (DRAPES) ×2 IMPLANT
DECANTER SPIKE VIAL GLASS SM (MISCELLANEOUS) ×2 IMPLANT
DERMABOND ADVANCED (GAUZE/BANDAGES/DRESSINGS) ×1
DERMABOND ADVANCED .7 DNX12 (GAUZE/BANDAGES/DRESSINGS) ×1 IMPLANT
DRAIN JACKSON PRATT 10MM FLAT (MISCELLANEOUS) ×1 IMPLANT
DRAPE C-ARM 42X72 X-RAY (DRAPES) ×4 IMPLANT
DRAPE LAPAROTOMY 100X72 PEDS (DRAPES) ×2 IMPLANT
DRAPE MICROSCOPE LEICA (MISCELLANEOUS) ×2 IMPLANT
DRAPE POUCH INSTRU U-SHP 10X18 (DRAPES) ×2 IMPLANT
DRAPE PROXIMA HALF (DRAPES) IMPLANT
DRILL NEURO 2X3.1 SOFT TOUCH (MISCELLANEOUS) ×2
ELECT COATED BLADE 2.86 ST (ELECTRODE) ×2 IMPLANT
ELECT REM PT RETURN 9FT ADLT (ELECTROSURGICAL) ×2
ELECTRODE REM PT RTRN 9FT ADLT (ELECTROSURGICAL) ×1 IMPLANT
EVACUATOR SILICONE 100CC (DRAIN) ×1 IMPLANT
GAUZE SPONGE 4X4 16PLY XRAY LF (GAUZE/BANDAGES/DRESSINGS) ×1 IMPLANT
GLOVE BIOGEL PI IND STRL 8 (GLOVE) ×1 IMPLANT
GLOVE BIOGEL PI INDICATOR 8 (GLOVE) ×2
GLOVE ECLIPSE 7.5 STRL STRAW (GLOVE) ×6 IMPLANT
GLOVE EXAM NITRILE LRG STRL (GLOVE) IMPLANT
GLOVE EXAM NITRILE MD LF STRL (GLOVE) IMPLANT
GLOVE EXAM NITRILE XL STR (GLOVE) IMPLANT
GLOVE EXAM NITRILE XS STR PU (GLOVE) IMPLANT
GOWN BRE IMP SLV AUR LG STRL (GOWN DISPOSABLE) IMPLANT
GOWN BRE IMP SLV AUR XL STRL (GOWN DISPOSABLE) IMPLANT
GOWN STRL REIN 2XL LVL4 (GOWN DISPOSABLE) IMPLANT
HEAD HALTER (SOFTGOODS) ×2 IMPLANT
HEMOSTAT POWDER SURGIFOAM 1G (HEMOSTASIS) ×1 IMPLANT
KIT BASIN OR (CUSTOM PROCEDURE TRAY) ×2 IMPLANT
KIT ROOM TURNOVER OR (KITS) ×2 IMPLANT
NDL HYPO 25X1 1.5 SAFETY (NEEDLE) ×1 IMPLANT
NDL SPNL 22GX3.5 QUINCKE BK (NEEDLE) ×1 IMPLANT
NEEDLE HYPO 25X1 1.5 SAFETY (NEEDLE) ×2 IMPLANT
NEEDLE SPNL 22GX3.5 QUINCKE BK (NEEDLE) ×2 IMPLANT
NS IRRIG 1000ML POUR BTL (IV SOLUTION) ×2 IMPLANT
PACK LAMINECTOMY NEURO (CUSTOM PROCEDURE TRAY) ×2 IMPLANT
PAD ARMBOARD 7.5X6 YLW CONV (MISCELLANEOUS) ×6 IMPLANT
PATTIES SURGICAL 1X1 (DISPOSABLE) ×1 IMPLANT
PLATE CERV 68MM 4LV (Plate) ×1 IMPLANT
RUBBERBAND STERILE (MISCELLANEOUS) ×4 IMPLANT
SCREW FIX 4.0X15MM (Screw) ×4 IMPLANT
SCREW FIX 4.0X16MM (Screw) ×1 IMPLANT
SCREW VAR 4.0X15MM (Screw) ×2 IMPLANT
SPONGE GAUZE 4X4 12PLY (GAUZE/BANDAGES/DRESSINGS) ×1 IMPLANT
SPONGE INTESTINAL PEANUT (DISPOSABLE) ×2 IMPLANT
SPONGE SURGIFOAM ABS GEL 100 (HEMOSTASIS) ×2 IMPLANT
STAPLER SKIN PROX WIDE 3.9 (STAPLE) ×1 IMPLANT
SUT ETHILON 3 0 FSL (SUTURE) ×1 IMPLANT
SUT VIC AB 0 CT1 18XCR BRD8 (SUTURE) IMPLANT
SUT VIC AB 0 CT1 8-18 (SUTURE)
SUT VIC AB 2-0 CP2 18 (SUTURE) ×2 IMPLANT
SUT VIC AB 3-0 SH 8-18 (SUTURE) ×3 IMPLANT
SYR 20ML ECCENTRIC (SYRINGE) ×2 IMPLANT
TAPE CLOTH SURG 4X10 WHT LF (GAUZE/BANDAGES/DRESSINGS) ×1 IMPLANT
TOWEL OR 17X24 6PK STRL BLUE (TOWEL DISPOSABLE) ×1 IMPLANT
TOWEL OR 17X26 10 PK STRL BLUE (TOWEL DISPOSABLE) ×2 IMPLANT
WATER STERILE IRR 1000ML POUR (IV SOLUTION) ×2 IMPLANT

## 2013-03-10 NOTE — H&P (Signed)
Subjective: Patient is a 52 y.o. male who is admitted for treatment of multilevel cervical spondylosis and degenerative disease, with resulting bilateral neural foraminal encroachment at the C3-4, C4-5 and C5-6 levels, worst on the right at C3-4. He has a dynamic degenerative listhesis of C3 on 4. Symptomatically he has neck and bilateral cervical radicular pain and numbness, currently worse in the right upper extremity than the left upper extremity. He has a sense of weakness in the proximal right upper extremity. Patient is admitted for a three-level C3-4, C4-5, and C5-6 anterior cervical decompression and arthrodesis.   Patient Active Problem List   Diagnosis Date Noted  . Diarrhea 01/02/2012  . AVB (atrioventricular block) 01/02/2012  . Lithium toxicity 01/02/2012  . Benzodiazepine (tranquilizer) overdose 12/31/2011  . Hypotension due to drugs 12/31/2011  . Opioid abuse, episodic use 12/17/2011  . Bipolar 1 disorder, depressed, moderate 12/06/2011  . Generalized anxiety disorder 12/06/2011  . Alcohol dependence in remission 12/06/2011  . Movement disorder 11/30/2011  . DRUG OVERDOSE 07/22/2007  . DISORDER, DENTAL NOS 05/21/2007  . ERECTILE DYSFUNCTION 11/25/2006  . SHOULDER PAIN, RIGHT, CHRONIC 11/25/2006  . PERIPHERAL NEUROPATHY 08/17/2006  . TACHYCARDIA, HX OF 08/17/2006  . HEPATITIS C 06/23/2006  . DISEASE, INFECTIOUS/PARASITIC NOS 06/23/2006  . BIPOLAR AFFECTIVE DISORDER 06/23/2006  . TOBACCO ABUSE 06/23/2006  . HYPERTENSION 06/23/2006  . PSORIASIS 06/23/2006  . AVASCULAR NECROSIS 06/23/2006  . ALCOHOL ABUSE, HX OF 06/23/2006  . STATUS, SHOULDER JOINT REPLACEMENT 06/23/2006  . Hip Joint Replacement by Other Means 06/23/2006  . APPENDECTOMY, HX OF 06/23/2006   Past Medical History  Diagnosis Date  . Tachycardia   . Peripheral neuropathy   . Alcohol abuse     hx of quit 4/04  . Bilateral external ear infections 2007  . Tobacco abuse   . Bipolar affective disorder    Followed by Dr. [name]  . Avascular necrosis     SP right and left hip replacements and L shoulder arthroplasty, secondary to steroid use.  Has been on embrel in past.  . Psoriasis     Previously on embrel, stopped because of price.   . Hypertension   . Hepatitis C   . ARF (acute renal failure) 2008    Multifactorial in setting of over use of ibuprofen and HCTZ./ACEI  . Cirrhosis   . Back pain   . Pneumonia     hx  . Heart murmur   . Stroke 11    ? "mini stroke"  . GERD (gastroesophageal reflux disease)     occ  . Arthritis     Past Surgical History  Procedure Laterality Date  . Right total hip replacement  2004    Due to AVN  . Left total hip replacement  2001    Due to AVN  . Closed manipulation, pinning, application of a traction pin right  2002  . Right shoulder arthroplasty      Due to AVN  . Appendectomy  1989  . Joint replacement Left 02    shoulder  . Tonsillectomy    . Hernia repair      Prescriptions prior to admission  Medication Sig Dispense Refill  . amLODipine (NORVASC) 5 MG tablet Take 10 mg by mouth daily.      . clobetasol ointment (TEMOVATE) 0.05 % Apply 1 application topically daily as needed. Apply affected areas for psoriasis      . cloNIDine (CATAPRES) 0.3 MG tablet Take 0.3 mg by mouth 2 (two) times daily.      Marland Kitchen  gabapentin (NEURONTIN) 800 MG tablet Take 800 mg by mouth 3 (three) times daily.      Marland Kitchen lithium carbonate 300 MG capsule Take 300-600 mg by mouth 2 (two) times daily. Take one tablet in the morning and two tablets at nights      . QUEtiapine (SEROQUEL) 100 MG tablet Take 200 mg by mouth at bedtime as needed (for sleep or aggitation).      . sertraline (ZOLOFT) 100 MG tablet Take 100 mg by mouth daily.      . traZODone (DESYREL) 100 MG tablet Take 200 mg by mouth at bedtime.       Allergies  Allergen Reactions  . Erythromycin     REACTION: GI upset  . Hydromorphone Hcl     REACTION: itching  . Prochlorperazine Maleate     REACTION:  jaw lock    History  Substance Use Topics  . Smoking status: Current Every Day Smoker -- 1.00 packs/day for 35 years    Types: Cigarettes  . Smokeless tobacco: Never Used     Comment: Not interested in stopping.  . Alcohol Use: No     Comment: Previously alcoholic, denies ETOH for "past few weeks"    Family History  Problem Relation Age of Onset  . Hypertension Mother   . Bipolar disorder Father   . Obesity Father   . Obesity Brother      Review of Systems A comprehensive review of systems was negative.  Objective: Vital signs in last 24 hours: Temp:  [97.4 F (36.3 C)] 97.4 F (36.3 C) (08/06 1006) Pulse Rate:  [70] 70 (08/06 1006) Resp:  [18] 18 (08/06 1006) BP: (135)/(78) 135/78 mmHg (08/06 1006) SpO2:  [100 %] 100 % (08/06 1006)  EXAM: Patient is a well-developed well-nourished white male in no acute distress. Lungs are clear to auscultation , the patient has symmetrical respiratory excursion. Heart has a regular rate and rhythm normal S1 and S2 no murmur.   Abdomen is soft nontender nondistended bowel sounds are present. Extremity examination shows no clubbing cyanosis or edema. Musculoskeletal examination shows tenderness to palpation in the base of his neck. Range of motion the neck causes pain in forward flexion, that radiates down to the medial aspect of the right arm. Lateral flexion to the right causes pain that radiates to the medial aspect of the right arm. Neurologic examination shows 5/5 strength in the upper extremities including the deltoid, biceps, triceps, intrinsics, and grip, but somewhat less intense strength in the right deltoid, biceps, and triceps, as compared to the left deltoid, biceps, and triceps. Sensation is diminished to pinprick in the right forearm and hand as well as along the digits of the right hand, as compared to left forearm, hand, and digits of left hand. Reflexes: Left biceps, brachioradialis, and triceps are 2; right biceps,  brachioradialis, and triceps are minimal. Quadriceps and gastrocnemius middle bilaterally. Toes are downgoing bilaterally. He has a normal gait and stance.  Data Review:CBC    Component Value Date/Time   WBC 4.5 03/05/2013 1558   RBC 4.44 03/05/2013 1558   HGB 15.8 03/05/2013 1558   HCT 45.9 03/05/2013 1558   PLT 99* 03/05/2013 1558   MCV 103.4* 03/05/2013 1558   MCH 35.6* 03/05/2013 1558   MCHC 34.4 03/05/2013 1558   RDW 12.5 03/05/2013 1558   LYMPHSABS 1.0 06/22/2012 1715   MONOABS 0.4 06/22/2012 1715   EOSABS 0.1 06/22/2012 1715   BASOSABS 0.0 06/22/2012 1715  BMET    Component Value Date/Time   NA 138 03/05/2013 1558   K 4.7 03/05/2013 1558   CL 102 03/05/2013 1558   CO2 26 03/05/2013 1558   GLUCOSE 109* 03/05/2013 1558   BUN 11 03/05/2013 1558   CREATININE 0.83 03/05/2013 1558   CALCIUM 10.9* 03/05/2013 1558   GFRNONAA >90 03/05/2013 1558   GFRAA >90 03/05/2013 1558     Assessment/Plan: Patient with neck pain and bilateral cervical radicular pain, numbness, tingling, worse on the right than the left. He has multilevel spondylosis and degenerative disease, with neural foraminal encroachment, bilaterally at the C3-4, C4-5, and C5-6 levels. He is admitted for decompression and arthrodesis.  I've discussed with the patient the nature of his condition, the nature the surgical procedure, the typical length of surgery, hospital stay, and overall recuperation. We discussed limitations postoperatively. I discussed risks of surgery including risks of infection, bleeding, possibly need for transfusion, the risk of nerve root dysfunction with pain, weakness, numbness, or paresthesias, the risk of spinal cord dysfunction with paralysis of all 4 limbs and quadriplegia, and the risk of dural tear and CSF leakage and possible need for further surgery, the risk of esophageal dysfunction causing dysphagia and the risk of laryngeal dysfunction causing hoarseness of the voice, the risk of failure of the  arthrodesis and the possible need for further surgery, and the risk of anesthetic complications including myocardial infarction, stroke, pneumonia, and death. We also discussed the need for postoperative immobilization in a cervical collar. Understanding all this the patient does wish to proceed with surgery and is admitted for such.    Hewitt Shorts, MD 03/10/2013 12:26 PM

## 2013-03-10 NOTE — Plan of Care (Signed)
Problem: Consults Goal: Diagnosis - Spinal Surgery Outcome: Completed/Met Date Met:  03/10/13 Cervical Spine Fusion

## 2013-03-10 NOTE — Anesthesia Postprocedure Evaluation (Signed)
  Anesthesia Post-op Note  Patient: [name]  Procedure(s) Performed: Procedure(s) with comments: Cervical Two-Three, Three to Four, Cervical Four to Five, Cervical Five to Six anterior cervical decompression with fusion plating and bonegraft (N/A) - ANTERIOR CERVICAL DECOMPRESSION/DISCECTOMY FUSION 4 LEVELS  Patient Location: PACU  Anesthesia Type:General  Level of Consciousness: awake  Airway and Oxygen Therapy: Patient Spontanous Breathing  Post-op Pain: mild  Post-op Assessment: Post-op Vital signs reviewed  Post-op Vital Signs: Reviewed  Complications: No apparent anesthesia complications

## 2013-03-10 NOTE — Op Note (Signed)
03/10/2013  5:03 PM  PATIENT:  [name]  52 y.o. male  PRE-OPERATIVE DIAGNOSIS:  cervical spondylosis cervical degenerative disc disease cervical radiculopathy  POST-OPERATIVE DIAGNOSIS:  cervical spondylosis cervical degenerative disc disease cervical radiculopathy  PROCEDURE:  Procedure(s): Cervical Two-Three, Three to Four, Cervical Four to Five, Cervical Five to Six anterior cervical decompression with fusion plating and bonegraft  SURGEON:  Surgeon(s): Hewitt Shorts, MD Tia Alert, MD  ASSISTANTS: Tia Alert, M.D.  ANESTHESIA:   general  EBL:  Total I/O In: 3000 [I.V.:2500; IV Piggyback:500] Out: 750 [Urine:350; Blood:400]  BLOOD ADMINISTERED:none  COUNT: Correct per nursing staff  DRAINS: Penrose drain in the prevertebral space   DICTATION: Patient was brought to the operating room placed under general endotracheal anesthesia. Patient was placed in 10 pounds of halter traction. The neck was prepped with Betadine soap and solution and draped in a sterile fashion. A obliquel incision was made on the left side of the neck paralleling the anterior border of the sternocleidomastoid.. The line of the incision was infiltrated with local anesthetic with epinephrine. Dissection was carried down thru the subcutaneous tissue and platysma, bipolar cautery was used to maintain hemostasis. Dissection was then carried out thru an avascular plane leaving the sternocleidomastoid carotid artery and jugular vein laterally and the trachea and esophagus medially. The ventral aspect of the vertebral column was identified, and it was difficult to localize the disc space levels due to extensive calcification of the anterior longitudinal ligament. We inadvertently opened the anterolateral ligament at the C2-3 level. Use a localizing x-ray and C-arm fluoroscopy we were eventually able to identify the C3-4, C4-5, and C5-6 levels. The annulus at each level was incised and the disc space  entered. Extensive anterior spondylitic overgrowth was removed using the osteophyte removal tool and Kerrison punches. Discectomy was performed with micro-curettes and pituitary rongeurs. The operating microscope was draped and brought into the field provided additional magnification illumination and visualization. Discectomy was continued posteriorly thru the disc space and then the cartilaginous endplate was removed using micro-curettes along with the high-speed drill. At the C3-4, C4-5, and C5-6 levels posterior osteophytic overgrowth was removed using the high-speed drill along with a 2 mm thin footplated Kerrison punch. Posterior longitudinal ligament along with spondylitic overgrowth was carefully removed, decompressing the spinal canal and thecal sac. We then continued to remove osteophytic overgrowth and disc material decompressing the neural foramina and exiting nerve roots bilaterally. There was extensive venous dilation at every level, the greatest bleeding we had was venous bleeding towards the right C3-4 neural foramen. All the bleeding was controlled with Gelfoam with thrombin and Surgifoam. At the C2-3 level a thorough discectomy was performed, but we did not open the posterior longitudinal ligament. Once the decompression was completed hemostasis was established at each level. Hemostasis was confirmed. We then measured the height of each intravertebral disc space level and selected a 6 millimeter in height structural allograft for the C2-3 level, a 6 millimeter in height structural allograft for the C3-4 level, a 6 millimeter in height structural allograft for the C4-5 level, and a 6 millimeter in height structural allograft for the C5-6 level . Each was hydrated and saline solution and then gently positioned in the intravertebral disc space and countersunk. We then selected a 68 millimeter in height Tether cervical plate. It was positioned over the fusion construct and secured to the vertebra with 4  x 15 mm fixed and variable screws. A pair of fixed screws at C2,  a single variable screw at C3, a single fixed screw at C4, a single variable screw at C5, and a pair of fixed screws at C6. Each screw hole was started with the high-speed drill and then the screws placed, once all the screws were placed final tightening was performed. The wound was irrigated with bacitracin solution checked for hemostasis which was established and confirmed. However because of the length of the construct, a 10 mm wide flat Jackson-Pratt drain was placed in the prevertebral space, and brought out through a separate stab incision. The C-arm fluoroscope was used to, and final imaging showed the grafts in good position, the plate and screws in good position, and the overall construct looked good. We then proceeded with closure. The platysma was closed with interrupted inverted 2-0 undyed Vicryl suture, the subcutaneous and subcuticular closed with interrupted inverted 3-0 undyed Vicryl suture. The Jackson-Pratt drain was sutured in place with a 3-0 nylon suture. The skin edges were approximated with Dermabond. A dressing of sterile gauze and Hypafix was applied. Following surgery the patient was taken out of cervical traction. To be reversed and the anesthetic and taken to the recovery room for further care.  PLAN OF CARE: Admit to inpatient   PATIENT DISPOSITION:  PACU - hemodynamically stable.   Delay start of Pharmacological VTE agent (>24hrs) due to surgical blood loss or risk of bleeding:  yes

## 2013-03-10 NOTE — Anesthesia Preprocedure Evaluation (Signed)
Anesthesia Evaluation  Patient identified by MRN, date of birth, ID band Patient awake    Reviewed: Allergy & Precautions, H&P , NPO status , Patient's Chart, lab work & pertinent test results  Airway Mallampati: I TM Distance: >3 FB Neck ROM: full    Dental   Pulmonary COPDCurrent Smoker,          Cardiovascular hypertension, + dysrhythmias Rhythm:regular Rate:Normal     Neuro/Psych Depression Bipolar Disorder  Neuromuscular disease CVA    GI/Hepatic GERD-  ,(+) Cirrhosis -       , Hepatitis -, C  Endo/Other    Renal/GU Renal InsufficiencyRenal disease     Musculoskeletal   Abdominal   Peds  Hematology   Anesthesia Other Findings   Reproductive/Obstetrics                           Anesthesia Physical Anesthesia Plan  ASA: III  Anesthesia Plan: General   Post-op Pain Management:    Induction: Intravenous  Airway Management Planned: Oral ETT  Additional Equipment:   Intra-op Plan:   Post-operative Plan: Extubation in OR  Informed Consent: I have reviewed the patients History and Physical, chart, labs and discussed the procedure including the risks, benefits and alternatives for the proposed anesthesia with the patient or authorized representative who has indicated his/her understanding and acceptance.     Plan Discussed with: CRNA, Anesthesiologist and Surgeon  Anesthesia Plan Comments:         Anesthesia Quick Evaluation

## 2013-03-10 NOTE — Preoperative (Signed)
Beta Blockers   Reason not to administer Beta Blockers:Not Applicable 

## 2013-03-10 NOTE — Transfer of Care (Signed)
Immediate Anesthesia Transfer of Care Note  Patient: [name]  Procedure(s) Performed: Procedure(s) with comments: Cervical Two-Three, Three to Four, Cervical Four to Five, Cervical Five to Six anterior cervical decompression with fusion plating and bonegraft (N/A) - ANTERIOR CERVICAL DECOMPRESSION/DISCECTOMY FUSION 4 LEVELS  Patient Location: PACU  Anesthesia Type:General  Level of Consciousness: awake, alert  and oriented  Airway & Oxygen Therapy: Patient Spontanous Breathing and Patient connected to nasal cannula oxygen  Post-op Assessment: Report given to PACU RN and Post -op Vital signs reviewed and stable  Post vital signs: Reviewed and stable  Complications: No apparent anesthesia complications

## 2013-03-10 NOTE — Progress Notes (Signed)
Filed Vitals:   03/10/13 1800 03/10/13 1815 03/10/13 1830 03/10/13 1858  BP: 147/92 136/85 145/89 133/90  Pulse: 73 70 74 66  Temp:   99.1 F (37.3 C) 99.2 F (37.3 C)  TempSrc:      Resp: 16 11 17 18   SpO2: 95%   94%    Patient sitting up in bed, eating dinner. Comfortable. Dressing clean and dry. Minimal drainage into Jackson-Pratt drain. Moving all 4 extremities well. Foley to straight drainage.  Plan: Continue to progress through postoperative recovery.  Hewitt Shorts, MD 03/10/2013, 7:40 PM

## 2013-03-11 MED ORDER — OXYCODONE-ACETAMINOPHEN 5-325 MG PO TABS: 1.0000 | ORAL_TABLET | ORAL | Status: DC | PRN

## 2013-03-11 NOTE — Progress Notes (Signed)
UR COMPLETED  

## 2013-03-11 NOTE — Discharge Summary (Signed)
Physician Discharge Summary  Patient ID: [name] MRN: [id number] DOB/AGE: 08-Aug-1960 52 y.o.  Admit date: 03/10/2013 Discharge date: 03/11/2013  Admission Diagnoses:  cervical spondylosis cervical degenerative disc disease cervical radiculopathy  Discharge Diagnoses:  cervical spondylosis cervical degenerative disc disease cervical radiculopathy  Discharged Condition: good  Hospital Course: Patient was admitted, underwent a 4 level C2-3, C3-4, C4-5, and C5-6 ACDF. Postoperatively he has done well. He is up and living in the halls. He did have a prevertebral drain placed intraoperatively, he was put out minimal amount of drainage, and therefore was removed. He's been given instructions regarding wound care and activities. He is to return for followup with me in 3 weeks.  Discharge Exam: Blood pressure 134/83, pulse 50, temperature 98.1 F (36.7 C), temperature source Oral, resp. rate 16, SpO2 98.00%.  Disposition: Home     Medication List         amLODipine 5 MG tablet  Commonly known as:  NORVASC  Take 10 mg by mouth daily.     clobetasol ointment 0.05 %  Commonly known as:  TEMOVATE  Apply 1 application topically daily as needed. Apply affected areas for psoriasis     cloNIDine 0.3 MG tablet  Commonly known as:  CATAPRES  Take 0.3 mg by mouth 2 (two) times daily.     gabapentin 800 MG tablet  Commonly known as:  NEURONTIN  Take 800 mg by mouth 3 (three) times daily.     lithium carbonate 300 MG capsule  Take 300-600 mg by mouth 2 (two) times daily. Take one tablet in the morning and two tablets at nights     oxyCODONE-acetaminophen 5-325 MG per tablet  Commonly known as:  PERCOCET/ROXICET  Take 1-2 tablets by mouth every 4 (four) hours as needed for pain.     QUEtiapine 100 MG tablet  Commonly known as:  SEROQUEL  Take 200 mg by mouth at bedtime as needed (for sleep or aggitation).     sertraline 100 MG tablet  Commonly known as:  ZOLOFT  Take 100 mg by  mouth daily.     traZODone 100 MG tablet  Commonly known as:  DESYREL  Take 200 mg by mouth at bedtime.         Signed: Hewitt Shorts, MD 03/11/2013, 3:20 PM

## 2013-03-12 ENCOUNTER — Other Ambulatory Visit: Payer: Self-pay

## 2013-03-12 ENCOUNTER — Inpatient Hospital Stay (HOSPITAL_COMMUNITY)
Admission: EM | Admit: 2013-03-12 | Discharge: 2013-03-16 | DRG: 917 | Disposition: A | Discharge status: Home Health | Payer: Medicare PPO | Attending: Internal Medicine | Admitting: Internal Medicine

## 2013-03-12 ENCOUNTER — Emergency Department (HOSPITAL_COMMUNITY): Payer: Medicare PPO

## 2013-03-12 ENCOUNTER — Encounter (HOSPITAL_COMMUNITY): Payer: Self-pay | Admitting: Emergency Medicine

## 2013-03-12 DIAGNOSIS — B999 Unspecified infectious disease: Secondary | ICD-10-CM

## 2013-03-12 DIAGNOSIS — Z8679 Personal history of other diseases of the circulatory system: Secondary | ICD-10-CM

## 2013-03-12 DIAGNOSIS — F411 Generalized anxiety disorder: Secondary | ICD-10-CM

## 2013-03-12 DIAGNOSIS — D696 Thrombocytopenia, unspecified: Secondary | ICD-10-CM | POA: Diagnosis present

## 2013-03-12 DIAGNOSIS — Z981 Arthrodesis status: Secondary | ICD-10-CM

## 2013-03-12 DIAGNOSIS — Z9089 Acquired absence of other organs: Secondary | ICD-10-CM

## 2013-03-12 DIAGNOSIS — F17200 Nicotine dependence, unspecified, uncomplicated: Secondary | ICD-10-CM

## 2013-03-12 DIAGNOSIS — M8700 Idiopathic aseptic necrosis of unspecified bone: Secondary | ICD-10-CM

## 2013-03-12 DIAGNOSIS — B1710 Acute hepatitis C without hepatic coma: Secondary | ICD-10-CM

## 2013-03-12 DIAGNOSIS — R061 Stridor: Secondary | ICD-10-CM

## 2013-03-12 DIAGNOSIS — K089 Disorder of teeth and supporting structures, unspecified: Secondary | ICD-10-CM

## 2013-03-12 DIAGNOSIS — T50901A Poisoning by unspecified drugs, medicaments and biological substances, accidental (unintentional), initial encounter: Secondary | ICD-10-CM

## 2013-03-12 DIAGNOSIS — F528 Other sexual dysfunction not due to a substance or known physiological condition: Secondary | ICD-10-CM

## 2013-03-12 DIAGNOSIS — G929 Unspecified toxic encephalopathy: Secondary | ICD-10-CM | POA: Diagnosis present

## 2013-03-12 DIAGNOSIS — Z96649 Presence of unspecified artificial hip joint: Secondary | ICD-10-CM

## 2013-03-12 DIAGNOSIS — F1021 Alcohol dependence, in remission: Secondary | ICD-10-CM

## 2013-03-12 DIAGNOSIS — I10 Essential (primary) hypertension: Secondary | ICD-10-CM

## 2013-03-12 DIAGNOSIS — W19XXXA Unspecified fall, initial encounter: Secondary | ICD-10-CM

## 2013-03-12 DIAGNOSIS — T40601A Poisoning by unspecified narcotics, accidental (unintentional), initial encounter: Principal | ICD-10-CM

## 2013-03-12 DIAGNOSIS — T426X4A Poisoning by other antiepileptic and sedative-hypnotic drugs, undetermined, initial encounter: Secondary | ICD-10-CM | POA: Diagnosis present

## 2013-03-12 DIAGNOSIS — I4430 Unspecified atrioventricular block: Secondary | ICD-10-CM

## 2013-03-12 DIAGNOSIS — G92 Toxic encephalopathy: Secondary | ICD-10-CM | POA: Diagnosis present

## 2013-03-12 DIAGNOSIS — K219 Gastro-esophageal reflux disease without esophagitis: Secondary | ICD-10-CM | POA: Diagnosis present

## 2013-03-12 DIAGNOSIS — F319 Bipolar disorder, unspecified: Secondary | ICD-10-CM

## 2013-03-12 DIAGNOSIS — J392 Other diseases of pharynx: Secondary | ICD-10-CM

## 2013-03-12 DIAGNOSIS — G609 Hereditary and idiopathic neuropathy, unspecified: Secondary | ICD-10-CM

## 2013-03-12 DIAGNOSIS — G9340 Encephalopathy, unspecified: Secondary | ICD-10-CM

## 2013-03-12 DIAGNOSIS — M129 Arthropathy, unspecified: Secondary | ICD-10-CM | POA: Diagnosis present

## 2013-03-12 DIAGNOSIS — L408 Other psoriasis: Secondary | ICD-10-CM

## 2013-03-12 DIAGNOSIS — I952 Hypotension due to drugs: Secondary | ICD-10-CM

## 2013-03-12 DIAGNOSIS — F1110 Opioid abuse, uncomplicated: Secondary | ICD-10-CM

## 2013-03-12 DIAGNOSIS — Z96619 Presence of unspecified artificial shoulder joint: Secondary | ICD-10-CM

## 2013-03-12 DIAGNOSIS — R197 Diarrhea, unspecified: Secondary | ICD-10-CM

## 2013-03-12 DIAGNOSIS — F3132 Bipolar disorder, current episode depressed, moderate: Secondary | ICD-10-CM

## 2013-03-12 DIAGNOSIS — G259 Extrapyramidal and movement disorder, unspecified: Secondary | ICD-10-CM

## 2013-03-12 HISTORY — DX: Other diseases of pharynx: J39.2

## 2013-03-12 LAB — URINALYSIS, ROUTINE W REFLEX MICROSCOPIC
Glucose, UA: NEGATIVE mg/dL
Hgb urine dipstick: NEGATIVE
Ketones, ur: NEGATIVE mg/dL
Protein, ur: NEGATIVE mg/dL

## 2013-03-12 LAB — DIFFERENTIAL
Basophils Absolute: 0 10*3/uL (ref 0.0–0.1)
Basophils Relative: 0 % (ref 0–1)
Eosinophils Absolute: 0 10*3/uL (ref 0.0–0.7)
Monocytes Relative: 6 % (ref 3–12)
Neutrophils Relative %: 83 % — ABNORMAL HIGH (ref 43–77)

## 2013-03-12 LAB — TROPONIN I: Troponin I: <0.3 ng/mL

## 2013-03-12 LAB — COMPREHENSIVE METABOLIC PANEL
AST: 33 U/L (ref 0–37)
Albumin: 3.5 g/dL (ref 3.5–5.2)
Alkaline Phosphatase: 89 U/L (ref 39–117)
BUN: 7 mg/dL (ref 6–23)
Potassium: 4 mEq/L (ref 3.5–5.1)
Total Protein: 7 g/dL (ref 6.0–8.3)

## 2013-03-12 LAB — GLUCOSE, CAPILLARY
Glucose-Capillary: 137 mg/dL — ABNORMAL HIGH (ref 70–99)
Glucose-Capillary: 152 mg/dL — ABNORMAL HIGH (ref 70–99)

## 2013-03-12 LAB — POCT I-STAT, CHEM 8
Glucose, Bld: 154 mg/dL — ABNORMAL HIGH (ref 70–99)
HCT: 37 % — ABNORMAL LOW (ref 39.0–52.0)
Hemoglobin: 12.6 g/dL — ABNORMAL LOW (ref 13.0–17.0)
Potassium: 3.9 mEq/L (ref 3.5–5.1)
Sodium: 140 mEq/L (ref 135–145)
TCO2: 28 mmol/L (ref 0–100)

## 2013-03-12 LAB — RAPID URINE DRUG SCREEN, HOSP PERFORMED
Amphetamines: NOT DETECTED
Benzodiazepines: NOT DETECTED
Opiates: POSITIVE — AB

## 2013-03-12 LAB — CBC
Hemoglobin: 11.7 g/dL — ABNORMAL LOW (ref 13.0–17.0)
MCH: 34.8 pg — ABNORMAL HIGH (ref 26.0–34.0)
MCHC: 33.2 g/dL (ref 30.0–36.0)
Platelets: 62 10*3/uL — ABNORMAL LOW (ref 150–400)
RDW: 12.7 % (ref 11.5–15.5)

## 2013-03-12 LAB — ETHANOL: Alcohol, Ethyl (B): <11 mg/dL (ref 0–11)

## 2013-03-12 LAB — APTT: aPTT: 26 s (ref 24–37)

## 2013-03-12 LAB — PROTIME-INR
INR: 1.06 (ref 0.00–1.49)
Prothrombin Time: 13.6 seconds (ref 11.6–15.2)

## 2013-03-12 MED ORDER — RACEPINEPHRINE HCL 2.25 % IN NEBU
0.5000 mL | INHALATION_SOLUTION | Freq: Once | RESPIRATORY_TRACT | Status: AC
  Administered 2013-03-12: 0.5 mL via RESPIRATORY_TRACT
  Filled 2013-03-12: qty 0.5

## 2013-03-12 MED ORDER — GABAPENTIN 400 MG PO CAPS
800.0000 mg | ORAL_CAPSULE | Freq: Three times a day (TID) | ORAL | Status: DC
  Administered 2013-03-12 – 2013-03-15 (×12): 800 mg via ORAL
  Filled 2013-03-12 (×15): qty 2

## 2013-03-12 MED ORDER — SODIUM CHLORIDE 0.9 % IV SOLN: 250.0000 mL | INTRAVENOUS | Status: DC | PRN

## 2013-03-12 MED ORDER — AMLODIPINE BESYLATE 10 MG PO TABS
10.0000 mg | ORAL_TABLET | Freq: Every day | ORAL | Status: DC
  Administered 2013-03-12 – 2013-03-15 (×4): 10 mg via ORAL
  Filled 2013-03-12 (×5): qty 1

## 2013-03-12 MED ORDER — RACEPINEPHRINE HCL 2.25 % IN NEBU
0.5000 mL | INHALATION_SOLUTION | Freq: Once | RESPIRATORY_TRACT | Status: AC
  Administered 2013-03-12: 0.5 mL via RESPIRATORY_TRACT

## 2013-03-12 MED ORDER — DEXAMETHASONE SODIUM PHOSPHATE 10 MG/ML IJ SOLN
10.0000 mg | Freq: Once | INTRAMUSCULAR | Status: AC
  Administered 2013-03-12: 10 mg via INTRAVENOUS
  Filled 2013-03-12: qty 1

## 2013-03-12 MED ORDER — DEXAMETHASONE SODIUM PHOSPHATE 10 MG/ML IJ SOLN
10.0000 mg | Freq: Three times a day (TID) | INTRAMUSCULAR | Status: DC
  Administered 2013-03-12 – 2013-03-13 (×3): 10 mg via INTRAVENOUS
  Filled 2013-03-12: qty 2.5
  Filled 2013-03-12 (×2): qty 1
  Filled 2013-03-12 (×2): qty 2.5
  Filled 2013-03-12: qty 1

## 2013-03-12 MED ORDER — DEXAMETHASONE SODIUM PHOSPHATE 4 MG/ML IJ SOLN
4.0000 mg | Freq: Four times a day (QID) | INTRAMUSCULAR | Status: DC
  Filled 2013-03-12 (×4): qty 1

## 2013-03-12 MED ORDER — ALBUTEROL SULFATE (5 MG/ML) 0.5% IN NEBU
5.0000 mg | INHALATION_SOLUTION | Freq: Once | RESPIRATORY_TRACT | Status: DC
  Administered 2013-03-12: 5 mg via RESPIRATORY_TRACT

## 2013-03-12 MED ORDER — QUETIAPINE FUMARATE 200 MG PO TABS
200.0000 mg | ORAL_TABLET | Freq: Every evening | ORAL | Status: DC | PRN
  Administered 2013-03-12 – 2013-03-16 (×4): 200 mg via ORAL
  Filled 2013-03-12 (×4): qty 1

## 2013-03-12 MED ORDER — RACEPINEPHRINE HCL 2.25 % IN NEBU
INHALATION_SOLUTION | RESPIRATORY_TRACT | Status: AC
  Administered 2013-03-12: 0.5 mL
  Filled 2013-03-12: qty 0.5

## 2013-03-12 MED ORDER — LITHIUM CARBONATE 300 MG PO CAPS
600.0000 mg | ORAL_CAPSULE | Freq: Every day | ORAL | Status: DC
  Administered 2013-03-12 – 2013-03-15 (×4): 600 mg via ORAL
  Filled 2013-03-12 (×5): qty 2

## 2013-03-12 MED ORDER — GABAPENTIN 800 MG PO TABS
800.0000 mg | ORAL_TABLET | Freq: Three times a day (TID) | ORAL | Status: DC
  Filled 2013-03-12 (×3): qty 1

## 2013-03-12 MED ORDER — LITHIUM CARBONATE 300 MG PO CAPS
300.0000 mg | ORAL_CAPSULE | Freq: Every day | ORAL | Status: DC
  Administered 2013-03-12 – 2013-03-15 (×4): 300 mg via ORAL
  Filled 2013-03-12 (×5): qty 1

## 2013-03-12 MED ORDER — ALBUTEROL SULFATE (5 MG/ML) 0.5% IN NEBU
INHALATION_SOLUTION | RESPIRATORY_TRACT | Status: AC
  Filled 2013-03-12: qty 1

## 2013-03-12 MED ORDER — METHYLPREDNISOLONE SODIUM SUCC 125 MG IJ SOLR
125.0000 mg | Freq: Once | INTRAMUSCULAR | Status: AC
  Administered 2013-03-12: 125 mg via INTRAVENOUS
  Filled 2013-03-12: qty 2

## 2013-03-12 MED ORDER — CLONIDINE HCL 0.3 MG PO TABS
0.3000 mg | ORAL_TABLET | Freq: Two times a day (BID) | ORAL | Status: DC
  Administered 2013-03-12 – 2013-03-15 (×8): 0.3 mg via ORAL
  Filled 2013-03-12 (×10): qty 1

## 2013-03-12 MED ORDER — SERTRALINE HCL 100 MG PO TABS
100.0000 mg | ORAL_TABLET | Freq: Every day | ORAL | Status: DC
  Administered 2013-03-12 – 2013-03-15 (×4): 100 mg via ORAL
  Filled 2013-03-12 (×5): qty 1

## 2013-03-12 MED ORDER — TRAZODONE HCL 100 MG PO TABS
200.0000 mg | ORAL_TABLET | Freq: Every day | ORAL | Status: DC
  Administered 2013-03-12 – 2013-03-15 (×4): 200 mg via ORAL
  Filled 2013-03-12 (×5): qty 2

## 2013-03-12 MED ORDER — NALOXONE HCL 0.4 MG/ML IJ SOLN
INTRAMUSCULAR | Status: AC
  Administered 2013-03-12: 0.4 mg
  Filled 2013-03-12: qty 1

## 2013-03-12 MED ORDER — SODIUM CHLORIDE 0.9 % IV SOLN
INTRAVENOUS | Status: DC
  Administered 2013-03-12: 10:00:00 via INTRAVENOUS

## 2013-03-12 NOTE — H&P (Addendum)
PULMONARY  / CRITICAL CARE MEDICINE  Name: [name] MRN: [id number] DOB: 19-Mar-1961    ADMISSION DATE:  03/12/2013 CONSULTATION DATE:  03/12/2013  REFERRING MD :  Dierdre Highman PRIMARY SERVICE: PCCM  CHIEF COMPLAINT:  Fall, shortness of breath  BRIEF PATIENT DESCRIPTION: 52 y/o male with Hep C, Bipolar disorder was admitted on 8/8 from the [facility] ED for a fall after taking too many opiates at home, but then he was noticed to have stridor in the ED so PCCM was consulted.  SIGNIFICANT EVENTS / STUDIES:  8/8 CT head > scalp contusion over frontal bone, no fracture 8/8 CT spine > No acute finding, s/p C2-6 ACDF, no complication  LINES / TUBES:   CULTURES:   ANTIBIOTICS:   HISTORY OF PRESENT ILLNESS:  52 y/o male with Hep C, Bipolar disorder was admitted on 8/8 from the [facility] ED for a fall after taking too many opiates at home, but then he was noticed to have stridor in the ED so PCCM was consulted. Apparently he took 10 percocet between 6 PM and 10 PM on 8/7.  He woke up early in the morning on 8/8 with slurred speech and he fell.  He was brought in by EMS and a code stroke was called (then called off).  He was noted to have some trouble speaking and difficulty breathing with stridor, so he was given racemic epi which helped.     PAST MEDICAL HISTORY :  Past Medical History  Diagnosis Date  . Tachycardia   . Peripheral neuropathy   . Alcohol abuse     hx of quit 4/04  . Bilateral external ear infections 2007  . Tobacco abuse   . Bipolar affective disorder     Followed by Dr. [name]  . Avascular necrosis     SP right and left hip replacements and L shoulder arthroplasty, secondary to steroid use.  Has been on embrel in past.  . Psoriasis     Previously on embrel, stopped because of price.   . Hypertension   . Hepatitis C   . ARF (acute renal failure) 2008    Multifactorial in setting of over use of ibuprofen and HCTZ./ACEI  . Cirrhosis   . Back pain   . Pneumonia     hx  .  Heart murmur   . Stroke 11    ? "mini stroke"  . GERD (gastroesophageal reflux disease)     occ  . Arthritis    Past Surgical History  Procedure Laterality Date  . Right total hip replacement  2004    Due to AVN  . Left total hip replacement  2001    Due to AVN  . Closed manipulation, pinning, application of a traction pin right  2002  . Right shoulder arthroplasty      Due to AVN  . Appendectomy  1989  . Joint replacement Left 02    shoulder  . Tonsillectomy    . Hernia repair     Prior to Admission medications   Medication Sig Start Date End Date Taking? Authorizing Provider  amLODipine (NORVASC) 5 MG tablet Take 10 mg by mouth daily.   Yes Historical Provider, MD  clobetasol ointment (TEMOVATE) 0.05 % Apply 1 application topically daily as needed. Apply affected areas for psoriasis   Yes Historical Provider, MD  cloNIDine (CATAPRES) 0.3 MG tablet Take 0.3 mg by mouth 2 (two) times daily.   Yes Historical Provider, MD  gabapentin (NEURONTIN) 800 MG tablet Take  800 mg by mouth 3 (three) times daily.   Yes Historical Provider, MD  lithium carbonate 300 MG capsule Take 300-600 mg by mouth 2 (two) times daily. Take 300 mg in the morning and 600mg  at night   Yes Historical Provider, MD  oxyCODONE-acetaminophen (PERCOCET/ROXICET) 5-325 MG per tablet Take 1-2 tablets by mouth every 4 (four) hours as needed for pain. 03/11/13  Yes Hewitt Shorts, MD  QUEtiapine (SEROQUEL) 100 MG tablet Take 200 mg by mouth at bedtime as needed (for sleep or aggitation).   Yes Historical Provider, MD  sertraline (ZOLOFT) 100 MG tablet Take 100 mg by mouth daily.   Yes Historical Provider, MD  traZODone (DESYREL) 100 MG tablet Take 200 mg by mouth at bedtime.   Yes Historical Provider, MD   Allergies  Allergen Reactions  . Erythromycin     REACTION: GI upset  . Hydromorphone Hcl     REACTION: itching  . Prochlorperazine Maleate     REACTION: jaw lock    FAMILY HISTORY:  Family History  Problem  Relation Age of Onset  . Hypertension Mother   . Bipolar disorder Father   . Obesity Father   . Obesity Brother    SOCIAL HISTORY:  reports that he has been smoking Cigarettes.  He has a 35 pack-year smoking history. He has never used smokeless tobacco. He reports that he does not drink alcohol or use illicit drugs.  REVIEW OF SYSTEMS:   Gen: Denies fever, chills, weight change, fatigue, night sweats HEENT: Denies blurred vision, double vision, hearing loss, tinnitus, sinus congestion, rhinorrhea, sore throat, + neck stiffness, dysphagia PULM: Notes some shortness of breath, congestion, hoarseness CV: Denies chest pain, edema, orthopnea, paroxysmal nocturnal dyspnea, palpitations GI: Denies abdominal pain, nausea, vomiting, diarrhea, hematochezia, melena, constipation, change in bowel habits GU: Denies dysuria, hematuria, polyuria, oliguria, urethral discharge Endocrine: Denies hot or cold intolerance, polyuria, polyphagia or appetite change Derm: Denies rash, dry skin, scaling or peeling skin change Heme: Denies easy bruising, bleeding, bleeding gums Neuro: Denies headache, numbness, weakness, slurred speech, loss of memory or consciousness   SUBJECTIVE:   VITAL SIGNS: Temp:  [98.1 F (36.7 C)-98.6 F (37 C)] 98.6 F (37 C) (08/08 0545) Pulse Rate:  [50-104] 104 (08/08 0545) Resp:  [14-28] 22 (08/08 0546) BP: (134-141)/(80-83) 141/80 mmHg (08/08 0545) SpO2:  [80 %-100 %] 93 % (08/08 0653) HEMODYNAMICS:   VENTILATOR SETTINGS:   INTAKE / OUTPUT: Intake/Output   None     PHYSICAL EXAMINATION:  Gen: anxious, tachypnic but speaking in full sentences HEENT: L scalp facial laceration, PERRL, EOMi, OP clear, Neck collar in place, surgical dressing anterior neck c/d/i PULM: Stridor noted, no lower airway wheezing; good air movement CV: Tachy, regular, no mgr, no JVD AB: BS+, soft, nontender, no hsm Ext: warm, no edema, no clubbing, no cyanosis Derm: erythematous, psoriatic  rash throughout abdomen Neuro: A&Ox4, CN II-XII intact, strength 5/5 in all 4 extremities   LABS:  CBC Recent Labs     03/12/13  0500  03/12/13  0541  WBC  6.2   --   HGB  11.7*  12.6*  HCT  35.2*  37.0*  PLT  62*   --    Coag's Recent Labs     03/12/13  0500  APTT  26  INR  1.06   BMET Recent Labs     03/12/13  0500  03/12/13  0541  NA  139  140  K  4.0  3.9  CL  104  103  CO2  29   --   BUN  7  6  CREATININE  0.69  0.80  GLUCOSE  152*  154*   Electrolytes Recent Labs     03/12/13  0500  CALCIUM  10.5   Sepsis Markers No results found for this basename: LACTICACIDVEN, PROCALCITON, O2SATVEN,  in the last 72 hours ABG No results found for this basename: PHART, PCO2ART, PO2ART,  in the last 72 hours Liver Enzymes Recent Labs     03/12/13  0500  AST  33  ALT  36  ALKPHOS  89  BILITOT  0.3  ALBUMIN  3.5   Cardiac Enzymes Recent Labs     03/12/13  0530  TROPONINI  <0.30   Glucose Recent Labs     03/12/13  0706  GLUCAP  137*    Imaging Dg Cervical Spine 1 View  03/10/2013   *RADIOLOGY REPORT*  Clinical Data: Degenerative disc disease, radiculopathy  DG C-ARM 1-60 MIN, DG CERVICAL SPINE - 1 VIEW  Technique: Intraoperative spot radiographs  Comparison:  Intraoperative radiographs obtained earlier today at 13 23.  Findings: Single cross-table lateral radiograph demonstrates surgical changes of anterior cervical discectomy and fusion extending from C2-C6 with intervertebral spacers at all levels.  No evidence of immediate hardware complication.  The patient remains intubated.  A thermistor projects over the hypopharynx.  IMPRESSION:  C2-C6 ACDF as above.   Original Report Authenticated By: Malachy Moan, M.D.   Dg Cervical Spine 1 View  03/10/2013   *RADIOLOGY REPORT*  Clinical Data: Opening film for C3-C6 ACDF  DG CERVICAL SPINE - 1 VIEW  Comparison: Preoperative MRI cervical spine 02/22/2013  Findings: Single cross-table lateral radiograph  demonstrates a metallic marker at the C2-C3 interspace.  Soft tissue spreaders noted over the anterior neck.  The patient is intubated. Multilevel cervical spondylosis.  IMPRESSION: Intraoperative localization radiograph as above.   Original Report Authenticated By: Malachy Moan, M.D.   Ct Head Wo Contrast  03/12/2013   *RADIOLOGY REPORT*  Clinical Data:  Status post cervical fusion 1 day ago.  Patient unable to raise left leg.  Difficulty breathing.  CT HEAD WITHOUT CONTRAST CT CERVICAL SPINE WITHOUT CONTRAST  Technique:  Multidetector CT imaging of the head and cervical spine was performed following the standard protocol without intravenous contrast.  Multiplanar CT image reconstructions of the cervical spine were also generated.  Comparison:  Head CT scan 06/23/2012.  Cervical spine MRI 02/22/2013.  Intraoperative view of the cervical spine 03/10/2013.  CT HEAD  Findings: Soft tissue contusion is seen over the frontal bone.  No fracture is identified.  No evidence of acute intracranial abnormality including infarction, hemorrhage, mass lesion, mass effect, midline shift or abnormal extra-axial fluid collection is identified.  There is no hydrocephalus or pneumocephalus.  Imaged paranasal sinuses and mastoid air cells are clear.  IMPRESSION: Scalp contusion over the frontal bone without underlying fracture or intracranial abnormality.  CT CERVICAL SPINE  Findings: The patient is status post C2-6 anterior discectomy and fusion.  Hardware is intact and appears well positioned.  Interbody spacers are also well-positioned.  There is no fracture or subluxation.  Gas in the soft tissues of the neck on the left is consistent with postoperative change.  Small amount of mediastinal gas is also identified. No focal fluid collection is identified. Prevertebral soft tissues appear mildly swollen.  The airway appears widely patent.  The lung apices are clear.  IMPRESSION: No acute finding.  Status post C2-6  ACDF without  evidence of complication.   Original Report Authenticated By: Holley Dexter, M.D.   Ct Cervical Spine Wo Contrast  03/12/2013   *RADIOLOGY REPORT*  Clinical Data:  Status post cervical fusion 1 day ago.  Patient unable to raise left leg.  Difficulty breathing.  CT HEAD WITHOUT CONTRAST CT CERVICAL SPINE WITHOUT CONTRAST  Technique:  Multidetector CT imaging of the head and cervical spine was performed following the standard protocol without intravenous contrast.  Multiplanar CT image reconstructions of the cervical spine were also generated.  Comparison:  Head CT scan 06/23/2012.  Cervical spine MRI 02/22/2013.  Intraoperative view of the cervical spine 03/10/2013.  CT HEAD  Findings: Soft tissue contusion is seen over the frontal bone.  No fracture is identified.  No evidence of acute intracranial abnormality including infarction, hemorrhage, mass lesion, mass effect, midline shift or abnormal extra-axial fluid collection is identified.  There is no hydrocephalus or pneumocephalus.  Imaged paranasal sinuses and mastoid air cells are clear.  IMPRESSION: Scalp contusion over the frontal bone without underlying fracture or intracranial abnormality.  CT CERVICAL SPINE  Findings: The patient is status post C2-6 anterior discectomy and fusion.  Hardware is intact and appears well positioned.  Interbody spacers are also well-positioned.  There is no fracture or subluxation.  Gas in the soft tissues of the neck on the left is consistent with postoperative change.  Small amount of mediastinal gas is also identified. No focal fluid collection is identified. Prevertebral soft tissues appear mildly swollen.  The airway appears widely patent.  The lung apices are clear.  IMPRESSION: No acute finding.  Status post C2-6 ACDF without evidence of complication.   Original Report Authenticated By: Holley Dexter, M.D.   Dg Chest Portable 1 View  03/12/2013   *RADIOLOGY REPORT*  Clinical Data: Status post fall.  PORTABLE  CHEST - 1 VIEW  Comparison: PA and lateral chest 03/05/2013.  Findings: Lung volumes are slightly lower on the comparison study with mild basilar atelectasis.  No pneumothorax or pleural fluid is identified.  Heart size is upper normal.  Bilateral shoulder replacements noted.  IMPRESSION: No acute disease.   Original Report Authenticated By: Holley Dexter, M.D.   Dg C-arm 1-60 Min  03/10/2013   *RADIOLOGY REPORT*  Clinical Data: Degenerative disc disease, radiculopathy  DG C-ARM 1-60 MIN, DG CERVICAL SPINE - 1 VIEW  Technique: Intraoperative spot radiographs  Comparison:  Intraoperative radiographs obtained earlier today at 13 23.  Findings: Single cross-table lateral radiograph demonstrates surgical changes of anterior cervical discectomy and fusion extending from C2-C6 with intervertebral spacers at all levels.  No evidence of immediate hardware complication.  The patient remains intubated.  A thermistor projects over the hypopharynx.  IMPRESSION:  C2-C6 ACDF as above.   Original Report Authenticated By: Malachy Moan, M.D.     ASSESSMENT / PLAN:  PULMONARY A: Stridor > CT neck reviewed, no clear sign of tracheal compression; unclear if epiglottis or laryngeal related?  Responding to racemic epinephrine P:   -admit to ICU for observation -decadron 4mg  q6h -racemic epi again now -ENT to see and scope -discuss with neurosurgery  CARDIOVASCULAR A: Hypertension P:  -continue home medications  RENAL A:  No acute issues P:   -monitor UOP  GASTROINTESTINAL A:  Hep C, no acute issues P:   -NPO for now -advance diet as hoarseness, stridor improves  HEMATOLOGIC A:  Thrombocytopenia, no sign of bleeding P:  -monitor CBC  INFECTIOUS A:  No acute issues P:   -  monitor fever curve  ENDOCRINE A:  No acute issues P:   -monitor glucose  NEUROLOGIC A:  S/P C2-3,3-4,4-5,5-6 ACDF Fall > due to encephalopathy from narcotic overdose BIPOLAR disorder P:   -discussed with NSGY who  will see him now -restart pain meds later 8/8 -restart home meds when taking po  TODAY'S SUMMARY:   I have personally obtained a history, examined the patient, evaluated laboratory and imaging results, formulated the assessment and plan and placed orders. CRITICAL CARE: The patient is critically ill with multiple organ systems failure and requires high complexity decision making for assessment and support, frequent evaluation and titration of therapies, application of advanced monitoring technologies and extensive interpretation of multiple databases. Critical Care Time devoted to patient care services described in this note is 40 minutes.   Fonnie Jarvis Pulmonary and Critical Care Medicine [facility] Pager: [phone]  03/12/2013, 7:13 AM

## 2013-03-12 NOTE — ED Notes (Signed)
Elodia Florence, patient's wife, wanted to leave number in case we need to get in touch with her.  [phone].  She advised that she had to go to work.

## 2013-03-12 NOTE — ED Notes (Signed)
Per EMS, Pt LSN 2200 on 8/7. Pt went to bed and woke up sometime during the night with symptoms. EMS was unable to give a definite time of symptom onset. Sometimes after pt's symptoms began, pt fell and has bleeding from his head. EMS states they are unsure if pt lost consciousness prior to falling. Pt had recent neck surgery. Pt presents with left sided weakness, but no facial droop or aphasia.

## 2013-03-12 NOTE — Consult Note (Signed)
[name], [name] [id number] 28-Mar-1961 [name], MD  Reason for Consult: stridor, pharyngeal edema  HPI: 51yo WM who recently had ACDF who fell today, seen in ER and has been having intermittent stridor, ENt consulted for laryngoscopy.  Allergies:  Allergies  Allergen Reactions  . Erythromycin     REACTION: GI upset  . Hydromorphone Hcl     REACTION: itching  . Prochlorperazine Maleate     REACTION: jaw lock    ROS: neck pain, stridor, otherwise negative x 10 systems except per HPI  PMH:  Past Medical History  Diagnosis Date  . Tachycardia   . Peripheral neuropathy   . Alcohol abuse     hx of quit 4/04  . Bilateral external ear infections 2007  . Tobacco abuse   . Bipolar affective disorder     Followed by Dr. [name]  . Avascular necrosis     SP right and left hip replacements and L shoulder arthroplasty, secondary to steroid use.  Has been on embrel in past.  . Psoriasis     Previously on embrel, stopped because of price.   . Hypertension   . Hepatitis C   . ARF (acute renal failure) 2008    Multifactorial in setting of over use of ibuprofen and HCTZ./ACEI  . Cirrhosis   . Back pain   . Pneumonia     hx  . Heart murmur   . Stroke 11    ? "mini stroke"  . GERD (gastroesophageal reflux disease)     occ  . Arthritis     FH:  Family History  Problem Relation Age of Onset  . Hypertension Mother   . Bipolar disorder Father   . Obesity Father   . Obesity Brother     SH:  History   Social History  . Marital Status: Married    Spouse Name: N/A    Number of Children: N/A  . Years of Education: N/A   Occupational History  . Not on file.   Social History Main Topics  . Smoking status: Current Every Day Smoker -- 1.00 packs/day for 35 years    Types: Cigarettes  . Smokeless tobacco: Never Used     Comment: Not interested in stopping.  . Alcohol Use: No     Comment: Previously alcoholic, denies ETOH for "past few weeks"  . Drug Use: No  .  Sexually Active: No   Other Topics Concern  . Not on file   Social History Narrative   Patient has problems with his girlfriend who he refers to as an alcoholic.     PSH:  Past Surgical History  Procedure Laterality Date  . Right total hip replacement  2004    Due to AVN  . Left total hip replacement  2001    Due to AVN  . Closed manipulation, pinning, application of a traction pin right  2002  . Right shoulder arthroplasty      Due to AVN  . Appendectomy  1989  . Joint replacement Left 02    shoulder  . Tonsillectomy    . Hernia repair        Labs/Studies:Cspine Ct shows posterior pharyngeal edema anterior to the ACDF C-spine hardware with hooding of the posterior pharynx over the glottis. The piriform sinuses are symmetric and the true vocal folds are symmetric with no evidence of paralyzed vocal cord. The left neck shows subcutaneous edema and emphysema consistent with recent neck surgery.  Physical  Exam: CN 2-12  grossly intact and symmetric. No hoarseness when vocalizing and normal vocal volume but has intermittent mild/moderate  inspiratory stridor. EAC/TMs normal BL. Oral cavity, lips, gums, ororpharynx normal with no masses or lesions. Skin warm and dry. Nasal cavity without polyps or purulence. External nose and ears without masses or lesions. EOMI, PERRLA. Neck  With C-collar in place, no palpable hematoma but does have some mild.moderate left neck edema, likely post-operative.  Procedure Note: 31575 Informed verbal consent was obtained after explaining the risks (including bleeding and infection), benefits and alternatives of the procedure. Verbal timeout was performed prior to the procedure. The nose was topicalized with topical lidocaine. The 4mm flexible scope was advanced through the right  nasal cavity. The septum and turbinates appeared normal. The middle meatus was free of polyps or purulence. The eustachian tube, choana, and adenoids were normal in appearance. The  posterior pharyngeal wall superior to the larynx is edematous and full symmetrically and is causing hooding over the larynx and is nearly touching the epiglottis. Once the patient was asked to protrude the tongue, the glottis could be visualized. The epiglottis, tongue base, and vallecula appear normal with no edema. The larynx shows some mild edema of the left false vocal fold, but no edema or masses of the true vocal folds. The glottic aperture is patent although he does have significant inspissated secretions in the glottis. The true vocal folds abduct normally with inspiration and adduct normally with phonation. The subglottic larynx is patent. The patient tolerated the procedure with no immediate complications.  A/P: stridor with no evidence of vocal fold paralysis or paresis s/p recent ACDF. His stridor appears to be due to posterior pharyngeal edema/left neck edema and emphysema from his recent neck surgery that is pushing the posterior pharyngeal wall anterior. Would recommend IV steroids then transitioning to oral steroids, and racemic epi, etc. And monitoring for clinical improvement. His symptoms should improve as the posterior pharyngeal edema and emphysema resolve. Would also consider antibiotics and a mucolytic such as robinul or guaifenesin and aggressive pharyngeal/pulmonary toilet to help him clear his pharyngeal and laryngeal secretions, and would humidify all oxygen.   Melvenia Beam 03/12/2013 9:33 AM

## 2013-03-12 NOTE — ED Notes (Signed)
Await ENT consult.

## 2013-03-12 NOTE — ED Provider Notes (Signed)
CSN: [id number]     Arrival date & time 03/12/13  4098 History     First MD Initiated Contact with Patient 03/12/13 0515     Chief Complaint  Patient presents with  . Code Stroke   (Consider location/radiation/quality/duration/timing/severity/associated sxs/prior Treatment) HPI History provided by patient, the EMS and the spouse. Had cervical spine fusion performed 2 days ago and discharged yesterday around 6 PM. He went to bed around 10 PM. He woke up this morning with slurred speech, fell and hit his head and his wife called EMS. Code stroke was called in route for lasting normal round 10 PM. His wife called ahead and reports that patient took about 10 Percocet between 6 PM and 10 PM. Emergency CT scan on arrival of head and neck obtained. Patient having some difficulty breathing and stridor. racemic epi and Narcan ordered. Patient reports she is used to taking high doses of narcotics, it is unclear about what happened today. He denies any new pain, weakness or numbness.  Past Medical History  Diagnosis Date  . Tachycardia   . Peripheral neuropathy   . Alcohol abuse     hx of quit 4/04  . Bilateral external ear infections 2007  . Tobacco abuse   . Bipolar affective disorder     Followed by Dr. [name]  . Avascular necrosis     SP right and left hip replacements and L shoulder arthroplasty, secondary to steroid use.  Has been on embrel in past.  . Psoriasis     Previously on embrel, stopped because of price.   . Hypertension   . Hepatitis C   . ARF (acute renal failure) 2008    Multifactorial in setting of over use of ibuprofen and HCTZ./ACEI  . Cirrhosis   . Back pain   . Pneumonia     hx  . Heart murmur   . Stroke 11    ? "mini stroke"  . GERD (gastroesophageal reflux disease)     occ  . Arthritis    Past Surgical History  Procedure Laterality Date  . Right total hip replacement  2004    Due to AVN  . Left total hip replacement  2001    Due to AVN  . Closed  manipulation, pinning, application of a traction pin right  2002  . Right shoulder arthroplasty      Due to AVN  . Appendectomy  1989  . Joint replacement Left 02    shoulder  . Tonsillectomy    . Hernia repair     Family History  Problem Relation Age of Onset  . Hypertension Mother   . Bipolar disorder Father   . Obesity Father   . Obesity Brother    History  Substance Use Topics  . Smoking status: Current Every Day Smoker -- 1.00 packs/day for 35 years    Types: Cigarettes  . Smokeless tobacco: Never Used     Comment: Not interested in stopping.  . Alcohol Use: No     Comment: Previously alcoholic, denies ETOH for "past few weeks"    Review of Systems  Constitutional: Negative for fever and chills.  HENT: Positive for neck pain.   Eyes: Negative for visual disturbance.  Respiratory: Positive for stridor. Negative for wheezing.   Cardiovascular: Negative for chest pain.  Gastrointestinal: Negative for abdominal pain.  Genitourinary: Negative for dysuria.  Musculoskeletal: Negative for back pain.  Skin: Positive for wound. Negative for rash.  Neurological: Positive for dizziness and headaches.  All other systems reviewed and are negative.    Allergies  Erythromycin; Hydromorphone hcl; and Prochlorperazine maleate  Home Medications   Current Outpatient Rx  Name  Route  Sig  Dispense  Refill  . amLODipine (NORVASC) 5 MG tablet   Oral   Take 10 mg by mouth daily.         . clobetasol ointment (TEMOVATE) 0.05 %   Topical   Apply 1 application topically daily as needed. Apply affected areas for psoriasis         . cloNIDine (CATAPRES) 0.3 MG tablet   Oral   Take 0.3 mg by mouth 2 (two) times daily.         Marland Kitchen gabapentin (NEURONTIN) 800 MG tablet   Oral   Take 800 mg by mouth 3 (three) times daily.         Marland Kitchen lithium carbonate 300 MG capsule   Oral   Take 300-600 mg by mouth 2 (two) times daily. Take 300 mg in the morning and 600mg  at night          . oxyCODONE-acetaminophen (PERCOCET/ROXICET) 5-325 MG per tablet   Oral   Take 1-2 tablets by mouth every 4 (four) hours as needed for pain.   80 tablet   0   . QUEtiapine (SEROQUEL) 100 MG tablet   Oral   Take 200 mg by mouth at bedtime as needed (for sleep or aggitation).         . sertraline (ZOLOFT) 100 MG tablet   Oral   Take 100 mg by mouth daily.         . traZODone (DESYREL) 100 MG tablet   Oral   Take 200 mg by mouth at bedtime.          BP 141/80  Pulse 104  Temp(Src) 98.6 F (37 C) (Oral)  Resp 22  SpO2 100% Physical Exam  Constitutional: He is oriented to person, place, and time. He appears well-developed and well-nourished.  HENT:  Head: Normocephalic.  Mild forehead contusion with ecchymosis. Right maxillary laceration without active bleeding. Uvula midline, upper plate  Eyes: EOM are normal. Pupils are equal, round, and reactive to light.  Neck:  Cervical collar in place. Inspiratory stridor present. left anterior cervical region mild tenderness and postoperative changes  Cardiovascular: Regular rhythm and intact distal pulses.   Borderline tachycardia  Pulmonary/Chest: Effort normal. No respiratory distress. He has no wheezes.  Scratchy hoarse voice. Lungs clear  Abdominal: Soft. He exhibits no distension. There is no tenderness.  Musculoskeletal: Normal range of motion. He exhibits no edema.  Neurological: He is alert and oriented to person, place, and time. No cranial nerve deficit.  Skin: Skin is warm and dry.    ED Course   Procedures (including critical care time)  Results for orders placed during the hospital encounter of 03/12/13  ETHANOL      Result Value Range   Alcohol, Ethyl (B) <11  0 - 11 mg/dL  PROTIME-INR      Result Value Range   Prothrombin Time 13.6  11.6 - 15.2 seconds   INR 1.06  0.00 - 1.49  APTT      Result Value Range   aPTT 26  24 - 37 seconds  CBC      Result Value Range   WBC 6.2  4.0 - 10.5 K/uL   RBC  3.36 (*) 4.22 - 5.81 MIL/uL   Hemoglobin 11.7 (*) 13.0 - 17.0 g/dL   HCT 45.4 (*)  39.0 - 52.0 %   MCV 104.8 (*) 78.0 - 100.0 fL   MCH 34.8 (*) 26.0 - 34.0 pg   MCHC 33.2  30.0 - 36.0 g/dL   RDW 91.4  78.2 - 95.6 %   Platelets 62 (*) 150 - 400 K/uL  DIFFERENTIAL      Result Value Range   Neutrophils Relative % 83 (*) 43 - 77 %   Neutro Abs 5.1  1.7 - 7.7 K/uL   Lymphocytes Relative 11 (*) 12 - 46 %   Lymphs Abs 0.7  0.7 - 4.0 K/uL   Monocytes Relative 6  3 - 12 %   Monocytes Absolute 0.4  0.1 - 1.0 K/uL   Eosinophils Relative 0  0 - 5 %   Eosinophils Absolute 0.0  0.0 - 0.7 K/uL   Basophils Relative 0  0 - 1 %   Basophils Absolute 0.0  0.0 - 0.1 K/uL  COMPREHENSIVE METABOLIC PANEL      Result Value Range   Sodium 139  135 - 145 mEq/L   Potassium 4.0  3.5 - 5.1 mEq/L   Chloride 104  96 - 112 mEq/L   CO2 29  19 - 32 mEq/L   Glucose, Bld 152 (*) 70 - 99 mg/dL   BUN 7  6 - 23 mg/dL   Creatinine, Ser 2.13  0.50 - 1.35 mg/dL   Calcium 08.6  8.4 - 57.8 mg/dL   Total Protein 7.0  6.0 - 8.3 g/dL   Albumin 3.5  3.5 - 5.2 g/dL   AST 33  0 - 37 U/L   ALT 36  0 - 53 U/L   Alkaline Phosphatase 89  39 - 117 U/L   Total Bilirubin 0.3  0.3 - 1.2 mg/dL   GFR calc non Af Amer >90  >90 mL/min   GFR calc Af Amer >90  >90 mL/min  TROPONIN I      Result Value Range   Troponin I <0.30  <0.30 ng/mL  POCT I-STAT, CHEM 8      Result Value Range   Sodium 140  135 - 145 mEq/L   Potassium 3.9  3.5 - 5.1 mEq/L   Chloride 103  96 - 112 mEq/L   BUN 6  6 - 23 mg/dL   Creatinine, Ser 4.69  0.50 - 1.35 mg/dL   Glucose, Bld 629 (*) 70 - 99 mg/dL   Calcium, Ion 5.28 (*) 1.12 - 1.23 mmol/L   TCO2 28  0 - 100 mmol/L   Hemoglobin 12.6 (*) 13.0 - 17.0 g/dL   HCT 41.3 (*) 24.4 - 01.0 %  POCT I-STAT TROPONIN I      Result Value Range   Troponin i, poc 0.01  0.00 - 0.08 ng/mL   Comment 3            Dg Chest 2 View  03/05/2013   *RADIOLOGY REPORT*  Clinical Data: Preoperative for ACDF  CHEST - 2 VIEW   Comparison: Dec 14, 2011  Findings: There is no focal infiltrate, pulmonary edema, or pleural effusion.  The mediastinal contour and cardiac silhouette are normal.  The soft tissues and osseous structures are stable.  IMPRESSION: No acute cardiopulmonary disease identified.   Original Report Authenticated By: Sherian Rein, M.D.   Dg Cervical Spine 1 View  03/10/2013   *RADIOLOGY REPORT*  Clinical Data: Degenerative disc disease, radiculopathy  DG C-ARM 1-60 MIN, DG CERVICAL SPINE - 1 VIEW  Technique: Intraoperative spot radiographs  Comparison:  Intraoperative radiographs obtained earlier today at 13 23.  Findings: Single cross-table lateral radiograph demonstrates surgical changes of anterior cervical discectomy and fusion extending from C2-C6 with intervertebral spacers at all levels.  No evidence of immediate hardware complication.  The patient remains intubated.  A thermistor projects over the hypopharynx.  IMPRESSION:  C2-C6 ACDF as above.   Original Report Authenticated By: Malachy Moan, M.D.   Dg Cervical Spine 1 View  03/10/2013   *RADIOLOGY REPORT*  Clinical Data: Opening film for C3-C6 ACDF  DG CERVICAL SPINE - 1 VIEW  Comparison: Preoperative MRI cervical spine 02/22/2013  Findings: Single cross-table lateral radiograph demonstrates a metallic marker at the C2-C3 interspace.  Soft tissue spreaders noted over the anterior neck.  The patient is intubated. Multilevel cervical spondylosis.  IMPRESSION: Intraoperative localization radiograph as above.   Original Report Authenticated By: Malachy Moan, M.D.   Ct Head Wo Contrast  03/12/2013   *RADIOLOGY REPORT*  Clinical Data:  Status post cervical fusion 1 day ago.  Patient unable to raise left leg.  Difficulty breathing.  CT HEAD WITHOUT CONTRAST CT CERVICAL SPINE WITHOUT CONTRAST  Technique:  Multidetector CT imaging of the head and cervical spine was performed following the standard protocol without intravenous contrast.  Multiplanar CT image  reconstructions of the cervical spine were also generated.  Comparison:  Head CT scan 06/23/2012.  Cervical spine MRI 02/22/2013.  Intraoperative view of the cervical spine 03/10/2013.  CT HEAD  Findings: Soft tissue contusion is seen over the frontal bone.  No fracture is identified.  No evidence of acute intracranial abnormality including infarction, hemorrhage, mass lesion, mass effect, midline shift or abnormal extra-axial fluid collection is identified.  There is no hydrocephalus or pneumocephalus.  Imaged paranasal sinuses and mastoid air cells are clear.  IMPRESSION: Scalp contusion over the frontal bone without underlying fracture or intracranial abnormality.  CT CERVICAL SPINE  Findings: The patient is status post C2-6 anterior discectomy and fusion.  Hardware is intact and appears well positioned.  Interbody spacers are also well-positioned.  There is no fracture or subluxation.  Gas in the soft tissues of the neck on the left is consistent with postoperative change.  Small amount of mediastinal gas is also identified. No focal fluid collection is identified. Prevertebral soft tissues appear mildly swollen.  The airway appears widely patent.  The lung apices are clear.  IMPRESSION: No acute finding.  Status post C2-6 ACDF without evidence of complication.   Original Report Authenticated By: Holley Dexter, M.D.   Ct Cervical Spine Wo Contrast  03/12/2013   *RADIOLOGY REPORT*  Clinical Data:  Status post cervical fusion 1 day ago.  Patient unable to raise left leg.  Difficulty breathing.  CT HEAD WITHOUT CONTRAST CT CERVICAL SPINE WITHOUT CONTRAST  Technique:  Multidetector CT imaging of the head and cervical spine was performed following the standard protocol without intravenous contrast.  Multiplanar CT image reconstructions of the cervical spine were also generated.  Comparison:  Head CT scan 06/23/2012.  Cervical spine MRI 02/22/2013.  Intraoperative view of the cervical spine 03/10/2013.  CT HEAD   Findings: Soft tissue contusion is seen over the frontal bone.  No fracture is identified.  No evidence of acute intracranial abnormality including infarction, hemorrhage, mass lesion, mass effect, midline shift or abnormal extra-axial fluid collection is identified.  There is no hydrocephalus or pneumocephalus.  Imaged paranasal sinuses and mastoid air cells are clear.  IMPRESSION: Scalp contusion over the frontal bone without underlying fracture or intracranial abnormality.  CT CERVICAL SPINE  Findings: The patient is status post C2-6 anterior discectomy and fusion.  Hardware is intact and appears well positioned.  Interbody spacers are also well-positioned.  There is no fracture or subluxation.  Gas in the soft tissues of the neck on the left is consistent with postoperative change.  Small amount of mediastinal gas is also identified. No focal fluid collection is identified. Prevertebral soft tissues appear mildly swollen.  The airway appears widely patent.  The lung apices are clear.  IMPRESSION: No acute finding.  Status post C2-6 ACDF without evidence of complication.   Original Report Authenticated By: Holley Dexter, M.D.   Mr Cervical Spine Wo Contrast  02/23/2013   *RADIOLOGY REPORT*  Clinical Data: Neck pain  MRI CERVICAL SPINE WITHOUT CONTRAST  Technique:  Multiplanar and multiecho pulse sequences of the cervical spine, to include the craniocervical junction and cervicothoracic junction, were obtained according to standard protocol without intravenous contrast.  Comparison: Prior radiograph from 02/16/2013  Findings: There is reversal of the normal cervical lordosis with apex at the C4-5 level. Trace anterolisthesis of C3 and C4 is present.  Degenerative intervertebral disc space narrowing is seen at C4-5 and C5-6 with associated degenerative endplate signal changes.  Otherwise, signal intensity from the vertebral body bone marrow is normal.  Signal intensity from the spinal cord is normal. The  visualized posterior fossa is grossly unremarkable.  At C2-3, there is mild degenerative disc osteophyte complex without significant canal or neural foraminal stenosis.  At C3-4, there is broad-based diffuse degenerative disc osteophyte with bilateral facet arthrosis.  There is resultant moderate bilateral foraminal narrowing, left worse than right.  No central canal stenosis.  At C4-5, there is broad-based degenerative disc osteophyte with right greater than left facet arthrosis and uncovertebral osteophytosis.  There is severe right with mild left foraminal narrowing.  No significant central canal stenosis.  At C5-6, there is broad-based degenerative disc osteophyte with bilateral facet arthrosis and uncovertebral osteophytosis.  There is severe bilateral foraminal narrowing, right worse than left.  At C6-7, there is mild degenerative disc osteophyte without significant canal or neural foraminal stenosis.  At C7 - T1, there is left-sided uncovertebral osteophytosis and facet arthrosis resulting in mild to moderate left foraminal narrowing.  No central canal stenosis.  IMPRESSION: 1.  Degenerative disc osteophyte with facet arthropathy at C4-5 with resultant severe right neural foraminal stenosis. 2.  Degenerative disc osteophyte and bilateral facet arthrosis at C5-6 resulting in severe bilateral foraminal narrowing, right worse than left. 3.  Additional multilevel degenerative disc disease as above.  No significant central canal stenosis identified in the cervical spine.   Original Report Authenticated By: Rise Mu, M.D.   Dg Chest Portable 1 View  03/12/2013   *RADIOLOGY REPORT*  Clinical Data: Status post fall.  PORTABLE CHEST - 1 VIEW  Comparison: PA and lateral chest 03/05/2013.  Findings: Lung volumes are slightly lower on the comparison study with mild basilar atelectasis.  No pneumothorax or pleural fluid is identified.  Heart size is upper normal.  Bilateral shoulder replacements noted.   IMPRESSION: No acute disease.   Original Report Authenticated By: Holley Dexter, M.D.   Dg C-arm 1-60 Min  03/10/2013   *RADIOLOGY REPORT*  Clinical Data: Degenerative disc disease, radiculopathy  DG C-ARM 1-60 MIN, DG CERVICAL SPINE - 1 VIEW  Technique: Intraoperative spot radiographs  Comparison:  Intraoperative radiographs obtained earlier today at 13 23.  Findings: Single cross-table lateral radiograph demonstrates surgical changes of anterior cervical discectomy  and fusion extending from C2-C6 with intervertebral spacers at all levels.  No evidence of immediate hardware complication.  The patient remains intubated.  A thermistor projects over the hypopharynx.  IMPRESSION:  C2-C6 ACDF as above.   Original Report Authenticated By: Malachy Moan, M.D.     Date: 03/12/2013  Rate: 109  Rhythm: sinus tachycardia  QRS Axis: left  Intervals: normal  ST/T Wave abnormalities: nonspecific ST changes  Conduction Disutrbances:none  Narrative Interpretation:   Old EKG Reviewed: unchanged  CRITICAL CARE Performed by: Sunnie Nielsen Total critical care time: 30 Critical care time was exclusive of separately billable procedures and treating other patients. Critical care was necessary to treat or prevent imminent or life-threatening deterioration. Critical care was time spent personally by me on the following activities: development of treatment plan with patient and/or surrogate as well as nursing, discussions with consultants, evaluation of patient's response to treatment, examination of patient, obtaining history from patient or surrogate, ordering and performing treatments and interventions, ordering and review of laboratory studies, ordering and review of radiographic studies, pulse oximetry and re-evaluation of patient's condition.  Code Stroke in route - canceled by DR Roseanne Reno after evaluated in ED  Racemic epi treatment / 125 Solumedrol and on recheck breathing much improved, speech clear -  feels like he needs to cough up phlegm but unable, albuterol ordered. Lungs remain clear.  5:48 AM NSG consulted, notified DR Phoebe Perch NSG post op PT with stridor, improving with racemic epi. He reviewed CT cervical spine, airway appears patent. No NSG emergency identified on imaging.  6:46 AM recurrent stridor, repeated racemic epi. PCCM consult and plan ICU admit  MDM  Altered mental status/slurred speech/ fall with contusion  Presented with stridorous respirations, briefly improved with racemic epi, required repeat treatment an ICU admission  IV steroids  EKG. Labs. Imaging - chest x-ray, CT head, CT cervical spine     Sunnie Nielsen, MD 03/13/13 1610

## 2013-03-12 NOTE — Code Documentation (Addendum)
Per [facility], patient LSN at 2200, patient awoke at some point during the night and fell. Left sided weakness started prior to fall according to patient. Patient discharged from neck surgery 8/7. Code stroke called at 0508, patient arrived at 0513, LKW 2200, EDP exam at 0513, stroke team arrived at 0513, neurologist arrived at 0520, patient arrived in CT at 83, phlebotomist arrived at 0520, CT read at 11 by Dr. Roseanne Reno. Initial NIH 02 for left sided weakness. Patient has edema around neck incision and stridor heard. Dr. Theodoro Kalata and RT notified. Will continue to monitor, code stroke cancelled at 0531 by Dr. Roseanne Reno.

## 2013-03-12 NOTE — Progress Notes (Signed)
Reason for Consult:  Followup of recent 4 level anterior cervical decompression and arthrodesis Referring Physician:  Dr. [name]   [name] is an 52 y.o. male.  HPI: Patient is a 52 year old male who is 2 days status post a 4 level C2-3, C3-4, C4-5, and C5-6 ACDF, who did quite well following surgery, and he was discharged to home yesterday, who was brought to the [facility] emergency room after having fallen at home early this morning. Patient was evaluated in the emergency room for a "code stroke" which was ruled out. However he is due to have some stridor, and critical care medicine was consulted and has admitted the patient to the medical intensive care unit for close observation. He was treated with racemic epinephrine and also started on Decadron. He was also noted that the patient's platelet count which was already low at 99,000 prior to surgery dropped down to 62,000 today in the emergency room.  The patient, the nursing staff, and Dr. Levy Pupa from [facility] all feel that his stridor has improved. The patient was seen in ENT consultation by Dr. Melvenia Beam who endoscopically examined the pharynx and larynx. He found no evidence of vocal cord dysfunction, but did find edema the posterior pharyngeal wall consistent with his recent surgery. Currently the patient is without complaints. He is n.p.o. and is being supported with IVF.  Past Medical History:  Past Medical History  Diagnosis Date  . Tachycardia   . Peripheral neuropathy   . Alcohol abuse     hx of quit 4/04  . Bilateral external ear infections 2007  . Tobacco abuse   . Bipolar affective disorder     Followed by Dr. [name]  . Avascular necrosis     SP right and left hip replacements and L shoulder arthroplasty, secondary to steroid use.  Has been on embrel in past.  . Psoriasis     Previously on embrel, stopped because of price.   . Hypertension   . Hepatitis C   . ARF (acute renal failure) 2008     Multifactorial in setting of over use of ibuprofen and HCTZ./ACEI  . Cirrhosis   . Back pain   . Pneumonia     hx  . Heart murmur   . Stroke 11    ? "mini stroke"  . GERD (gastroesophageal reflux disease)     occ  . Arthritis   . Pharyngeal edema 03/12/2013    Past Surgical History:  Past Surgical History  Procedure Laterality Date  . Right total hip replacement  2004    Due to AVN  . Left total hip replacement  2001    Due to AVN  . Closed manipulation, pinning, application of a traction pin right  2002  . Right shoulder arthroplasty      Due to AVN  . Appendectomy  1989  . Joint replacement Left 02    shoulder  . Tonsillectomy    . Hernia repair    . Anterior fusion cervical spine  03/10/2013    Family History:  Family History  Problem Relation Age of Onset  . Hypertension Mother   . Bipolar disorder Father   . Obesity Father   . Obesity Brother     Social History:  reports that he has been smoking Cigarettes.  He has a 35 pack-year smoking history. He has never used smokeless tobacco. He reports that he does not drink alcohol or use illicit drugs.  Allergies:  Allergies  Allergen Reactions  . Erythromycin     REACTION: GI upset  . Hydromorphone Hcl     REACTION: itching  . Prochlorperazine Maleate     REACTION: jaw lock    Medications: I have reviewed the patient's current medications.  Review of systems: Notable for those difficulties described in his history of present past medical history, but is otherwise unremarkable.  Physical Examination: Well-developed well-nourished white male in no acute distress. He is immobilized in a Aspen cervical collar. Blood pressure 144/80, pulse 61, temperature 98 F (36.7 C), temperature source Oral, resp. rate 19, height 5\' 11"  (1.803 m), weight 75.9 kg (167 lb 5.3 oz), SpO2 98.00%. External examination: Mild swelling in the neck, incision is healing well, but there is mild bruising. There is still small amount  of dark bloody drainage from the drain site, and a fresh dressing was applied.  Neurological Examination: Mental Status Examination:  Awake alert, oriented, following commands, speech fluent. Motor Examination:  5/5 strength in the upper and lower extremities. Sensory Examination:  Good sensation to pinprick. Gait and Stance Examination:  Not tested due to the nature of his condition.   Results for orders placed during the hospital encounter of 03/12/13 (from the past 48 hour(s))  ETHANOL     Status: None   Collection Time    03/12/13  5:00 AM      Result Value Range   Alcohol, Ethyl (B) <11  0 - 11 mg/dL   Comment:            LOWEST DETECTABLE LIMIT FOR     SERUM ALCOHOL IS 11 mg/dL     FOR MEDICAL PURPOSES ONLY  PROTIME-INR     Status: None   Collection Time    03/12/13  5:00 AM      Result Value Range   Prothrombin Time 13.6  11.6 - 15.2 seconds   INR 1.06  0.00 - 1.49  APTT     Status: None   Collection Time    03/12/13  5:00 AM      Result Value Range   aPTT 26  24 - 37 seconds  CBC     Status: Abnormal   Collection Time    03/12/13  5:00 AM      Result Value Range   WBC 6.2  4.0 - 10.5 K/uL   RBC 3.36 (*) 4.22 - 5.81 MIL/uL   Hemoglobin 11.7 (*) 13.0 - 17.0 g/dL   HCT 16.1 (*) 09.6 - 04.5 %   MCV 104.8 (*) 78.0 - 100.0 fL   MCH 34.8 (*) 26.0 - 34.0 pg   MCHC 33.2  30.0 - 36.0 g/dL   RDW 40.9  81.1 - 91.4 %   Platelets 62 (*) 150 - 400 K/uL   Comment: CONSISTENT WITH PREVIOUS RESULT  DIFFERENTIAL     Status: Abnormal   Collection Time    03/12/13  5:00 AM      Result Value Range   Neutrophils Relative % 83 (*) 43 - 77 %   Neutro Abs 5.1  1.7 - 7.7 K/uL   Lymphocytes Relative 11 (*) 12 - 46 %   Lymphs Abs 0.7  0.7 - 4.0 K/uL   Monocytes Relative 6  3 - 12 %   Monocytes Absolute 0.4  0.1 - 1.0 K/uL   Eosinophils Relative 0  0 - 5 %   Eosinophils Absolute 0.0  0.0 - 0.7 K/uL   Basophils Relative 0  0 -  1 %   Basophils Absolute 0.0  0.0 - 0.1 K/uL   COMPREHENSIVE METABOLIC PANEL     Status: Abnormal   Collection Time    03/12/13  5:00 AM      Result Value Range   Sodium 139  135 - 145 mEq/L   Potassium 4.0  3.5 - 5.1 mEq/L   Chloride 104  96 - 112 mEq/L   CO2 29  19 - 32 mEq/L   Glucose, Bld 152 (*) 70 - 99 mg/dL   BUN 7  6 - 23 mg/dL   Creatinine, Ser 4.09  0.50 - 1.35 mg/dL   Calcium 81.1  8.4 - 91.4 mg/dL   Total Protein 7.0  6.0 - 8.3 g/dL   Albumin 3.5  3.5 - 5.2 g/dL   AST 33  0 - 37 U/L   ALT 36  0 - 53 U/L   Alkaline Phosphatase 89  39 - 117 U/L   Total Bilirubin 0.3  0.3 - 1.2 mg/dL   GFR calc non Af Amer >90  >90 mL/min   GFR calc Af Amer >90  >90 mL/min   Comment:            The eGFR has been calculated     using the CKD EPI equation.     This calculation has not been     validated in all clinical     situations.     eGFR's persistently     <90 mL/min signify     possible Chronic Kidney Disease.  TROPONIN I     Status: None   Collection Time    03/12/13  5:30 AM      Result Value Range   Troponin I <0.30  <0.30 ng/mL   Comment:            Due to the release kinetics of cTnI,     a negative result within the first hours     of the onset of symptoms does not rule out     myocardial infarction with certainty.     If myocardial infarction is still suspected,     repeat the test at appropriate intervals.  POCT I-STAT TROPONIN I     Status: None   Collection Time    03/12/13  5:38 AM      Result Value Range   Troponin i, poc 0.01  0.00 - 0.08 ng/mL   Comment 3            Comment: Due to the release kinetics of cTnI,     a negative result within the first hours     of the onset of symptoms does not rule out     myocardial infarction with certainty.     If myocardial infarction is still suspected,     repeat the test at appropriate intervals.  POCT I-STAT, CHEM 8     Status: Abnormal   Collection Time    03/12/13  5:41 AM      Result Value Range   Sodium 140  135 - 145 mEq/L   Potassium 3.9  3.5 -  5.1 mEq/L   Chloride 103  96 - 112 mEq/L   BUN 6  6 - 23 mg/dL   Creatinine, Ser 7.82  0.50 - 1.35 mg/dL   Glucose, Bld 956 (*) 70 - 99 mg/dL   Calcium, Ion 2.13 (*) 1.12 - 1.23 mmol/L   TCO2 28  0 - 100 mmol/L   Hemoglobin 12.6 (*)  13.0 - 17.0 g/dL   HCT 40.9 (*) 81.1 - 91.4 %  URINE RAPID DRUG SCREEN (HOSP PERFORMED)     Status: Abnormal   Collection Time    03/12/13  6:51 AM      Result Value Range   Opiates POSITIVE (*) NONE DETECTED   Cocaine NONE DETECTED  NONE DETECTED   Benzodiazepines NONE DETECTED  NONE DETECTED   Amphetamines NONE DETECTED  NONE DETECTED   Tetrahydrocannabinol NONE DETECTED  NONE DETECTED   Barbiturates NONE DETECTED  NONE DETECTED   Comment:            DRUG SCREEN FOR MEDICAL PURPOSES     ONLY.  IF CONFIRMATION IS NEEDED     FOR ANY PURPOSE, NOTIFY LAB     WITHIN 5 DAYS.                LOWEST DETECTABLE LIMITS     FOR URINE DRUG SCREEN     Drug Class       Cutoff (ng/mL)     Amphetamine      1000     Barbiturate      200     Benzodiazepine   200     Tricyclics       300     Opiates          300     Cocaine          300     THC              50  URINALYSIS, ROUTINE W REFLEX MICROSCOPIC     Status: None   Collection Time    03/12/13  6:51 AM      Result Value Range   Color, Urine YELLOW  YELLOW   APPearance CLEAR  CLEAR   Specific Gravity, Urine 1.018  1.005 - 1.030   pH 6.0  5.0 - 8.0   Glucose, UA NEGATIVE  NEGATIVE mg/dL   Hgb urine dipstick NEGATIVE  NEGATIVE   Bilirubin Urine NEGATIVE  NEGATIVE   Ketones, ur NEGATIVE  NEGATIVE mg/dL   Protein, ur NEGATIVE  NEGATIVE mg/dL   Urobilinogen, UA 0.2  0.0 - 1.0 mg/dL   Nitrite NEGATIVE  NEGATIVE   Leukocytes, UA NEGATIVE  NEGATIVE   Comment: MICROSCOPIC NOT DONE ON URINES WITH NEGATIVE PROTEIN, BLOOD, LEUKOCYTES, NITRITE, OR GLUCOSE <1000 mg/dL.  GLUCOSE, CAPILLARY     Status: Abnormal   Collection Time    03/12/13  7:06 AM      Result Value Range   Glucose-Capillary 137 (*) 70 - 99  mg/dL  MRSA PCR SCREENING     Status: None   Collection Time    03/12/13  9:23 AM      Result Value Range   MRSA by PCR NEGATIVE  NEGATIVE   Comment:            The GeneXpert MRSA Assay (FDA     approved for NASAL specimens     only), is one component of a     comprehensive MRSA colonization     surveillance program. It is not     intended to diagnose MRSA     infection nor to guide or     monitor treatment for     MRSA infections.  GLUCOSE, CAPILLARY     Status: Abnormal   Collection Time    03/12/13  9:45 AM      Result Value Range   Glucose-Capillary 152 (*)  70 - 99 mg/dL    Dg Cervical Spine 1 View  03/10/2013   *RADIOLOGY REPORT*  Clinical Data: Degenerative disc disease, radiculopathy  DG C-ARM 1-60 MIN, DG CERVICAL SPINE - 1 VIEW  Technique: Intraoperative spot radiographs  Comparison:  Intraoperative radiographs obtained earlier today at 13 23.  Findings: Single cross-table lateral radiograph demonstrates surgical changes of anterior cervical discectomy and fusion extending from C2-C6 with intervertebral spacers at all levels.  No evidence of immediate hardware complication.  The patient remains intubated.  A thermistor projects over the hypopharynx.  IMPRESSION:  C2-C6 ACDF as above.   Original Report Authenticated By: Malachy Moan, M.D.   Ct Head Wo Contrast  03/12/2013   *RADIOLOGY REPORT*  Clinical Data:  Status post cervical fusion 1 day ago.  Patient unable to raise left leg.  Difficulty breathing.  CT HEAD WITHOUT CONTRAST CT CERVICAL SPINE WITHOUT CONTRAST  Technique:  Multidetector CT imaging of the head and cervical spine was performed following the standard protocol without intravenous contrast.  Multiplanar CT image reconstructions of the cervical spine were also generated.  Comparison:  Head CT scan 06/23/2012.  Cervical spine MRI 02/22/2013.  Intraoperative view of the cervical spine 03/10/2013.  CT HEAD  Findings: Soft tissue contusion is seen over the frontal  bone.  No fracture is identified.  No evidence of acute intracranial abnormality including infarction, hemorrhage, mass lesion, mass effect, midline shift or abnormal extra-axial fluid collection is identified.  There is no hydrocephalus or pneumocephalus.  Imaged paranasal sinuses and mastoid air cells are clear.  IMPRESSION: Scalp contusion over the frontal bone without underlying fracture or intracranial abnormality.  CT CERVICAL SPINE  Findings: The patient is status post C2-6 anterior discectomy and fusion.  Hardware is intact and appears well positioned.  Interbody spacers are also well-positioned.  There is no fracture or subluxation.  Gas in the soft tissues of the neck on the left is consistent with postoperative change.  Small amount of mediastinal gas is also identified. No focal fluid collection is identified. Prevertebral soft tissues appear mildly swollen.  The airway appears widely patent.  The lung apices are clear.  IMPRESSION: No acute finding.  Status post C2-6 ACDF without evidence of complication.   Original Report Authenticated By: Holley Dexter, M.D.   Ct Cervical Spine Wo Contrast  03/12/2013   *RADIOLOGY REPORT*  Clinical Data:  Status post cervical fusion 1 day ago.  Patient unable to raise left leg.  Difficulty breathing.  CT HEAD WITHOUT CONTRAST CT CERVICAL SPINE WITHOUT CONTRAST  Technique:  Multidetector CT imaging of the head and cervical spine was performed following the standard protocol without intravenous contrast.  Multiplanar CT image reconstructions of the cervical spine were also generated.  Comparison:  Head CT scan 06/23/2012.  Cervical spine MRI 02/22/2013.  Intraoperative view of the cervical spine 03/10/2013.  CT HEAD  Findings: Soft tissue contusion is seen over the frontal bone.  No fracture is identified.  No evidence of acute intracranial abnormality including infarction, hemorrhage, mass lesion, mass effect, midline shift or abnormal extra-axial fluid collection  is identified.  There is no hydrocephalus or pneumocephalus.  Imaged paranasal sinuses and mastoid air cells are clear.  IMPRESSION: Scalp contusion over the frontal bone without underlying fracture or intracranial abnormality.  CT CERVICAL SPINE  Findings: The patient is status post C2-6 anterior discectomy and fusion.  Hardware is intact and appears well positioned.  Interbody spacers are also well-positioned.  There is no fracture or subluxation.  Gas in the soft tissues of the neck on the left is consistent with postoperative change.  Small amount of mediastinal gas is also identified. No focal fluid collection is identified. Prevertebral soft tissues appear mildly swollen.  The airway appears widely patent.  The lung apices are clear.  IMPRESSION: No acute finding.  Status post C2-6 ACDF without evidence of complication.   Original Report Authenticated By: Holley Dexter, M.D.   Dg Chest Portable 1 View  03/12/2013   *RADIOLOGY REPORT*  Clinical Data: Status post fall.  PORTABLE CHEST - 1 VIEW  Comparison: PA and lateral chest 03/05/2013.  Findings: Lung volumes are slightly lower on the comparison study with mild basilar atelectasis.  No pneumothorax or pleural fluid is identified.  Heart size is upper normal.  Bilateral shoulder replacements noted.  IMPRESSION: No acute disease.   Original Report Authenticated By: Holley Dexter, M.D.   Dg C-arm 1-60 Min  03/10/2013   *RADIOLOGY REPORT*  Clinical Data: Degenerative disc disease, radiculopathy  DG C-ARM 1-60 MIN, DG CERVICAL SPINE - 1 VIEW  Technique: Intraoperative spot radiographs  Comparison:  Intraoperative radiographs obtained earlier today at 13 23.  Findings: Single cross-table lateral radiograph demonstrates surgical changes of anterior cervical discectomy and fusion extending from C2-C6 with intervertebral spacers at all levels.  No evidence of immediate hardware complication.  The patient remains intubated.  A thermistor projects over the  hypopharynx.  IMPRESSION:  C2-C6 ACDF as above.   Original Report Authenticated By: Malachy Moan, M.D.     Assessment/Plan: Patient today status post 4 level ACDF, who did well initially following surgery, and was discharged home. However early this morning he fell at home, and was evaluated in the emergency room. He was found to be stridorous, and was treated with racemic epinephrine and socially started on Decadron. The stridor has apparently substantially improved.  I've discussed the case with Dr. Levy Pupa from [facility]. I favor continuing the Decadron and IV fluid support. I also think that he can be out of bed to chair today if he does well with that progress to ambulation tomorrow. [facility] will continue to monitor his thrombocytopenia.  We will continue to follow along with [facility].  Hewitt Shorts, MD 03/12/2013, 3:19 PM

## 2013-03-12 NOTE — ED Notes (Signed)
Placed pt. on high humidity cool aerosol set up with "scoop"- face tent mask to help decrease Stridor/swelling in upper airway along with humidity btl. added to 2 lpm n/c under mask to assure humidified oxygenation if mask removed,  additional Racemic Epinephrine given as ordered prior, Dr. Soundra Pilon in to see pt.-agreed with therapy, ENT to see pt., airway cart remains outside room and wife remains @ bedside,  RT to monitor.

## 2013-03-13 LAB — CBC
HCT: 37.4 % — ABNORMAL LOW (ref 39.0–52.0)
Platelets: 66 10*3/uL — ABNORMAL LOW (ref 150–400)
RDW: 12.7 % (ref 11.5–15.5)
WBC: 5.4 10*3/uL (ref 4.0–10.5)

## 2013-03-13 LAB — BASIC METABOLIC PANEL
BUN: 8 mg/dL (ref 6–23)
Chloride: 107 mEq/L (ref 96–112)
GFR calc Af Amer: >90 mL/min (ref >90)
Potassium: 4.9 mEq/L (ref 3.5–5.1)

## 2013-03-13 MED ORDER — ACETAMINOPHEN 325 MG PO TABS: 650.0000 mg | ORAL_TABLET | ORAL | Status: DC | PRN

## 2013-03-13 MED ORDER — DEXAMETHASONE SODIUM PHOSPHATE 10 MG/ML IJ SOLN
4.0000 mg | Freq: Four times a day (QID) | INTRAMUSCULAR | Status: DC
  Administered 2013-03-13 – 2013-03-14 (×4): 4 mg via INTRAVENOUS
  Filled 2013-03-13 (×7): qty 0.4

## 2013-03-13 MED ORDER — KETOROLAC TROMETHAMINE 10 MG PO TABS
10.0000 mg | ORAL_TABLET | Freq: Three times a day (TID) | ORAL | Status: DC | PRN
  Administered 2013-03-13 – 2013-03-14 (×2): 10 mg via ORAL
  Filled 2013-03-13 (×2): qty 1

## 2013-03-13 NOTE — Progress Notes (Signed)
Subjective: Patient resting comfortably in bed. Has been kept n.p.o. and on bed rest by [facility].  Objective: Vital signs in last 24 hours: Filed Vitals:   03/13/13 0500 03/13/13 0600 03/13/13 0700 03/13/13 0800  BP: 134/72  156/91   Pulse: 51 59 54   Temp:    98 F (36.7 C)  TempSrc:    Oral  Resp: 17 17 17    Height:      Weight: 73.9 kg (162 lb 14.7 oz)     SpO2: 97% 97% 99%     Intake/Output from previous day: 08/08 0701 - 08/09 0700 In: 1608.8 [I.V.:1608.8] Out: 2075 [Urine:2075] Intake/Output this shift:    Physical Exam:  Moving all 4 extremities well. Wound healing well, clean and dry, no erythema or drainage. There is mild swelling in the anterior neck, unchanged from yesterday.  CBC  Recent Labs  03/12/13 0500 03/12/13 0541 03/13/13 0500  WBC 6.2  --  5.4  HGB 11.7* 12.6* 12.2*  HCT 35.2* 37.0* 37.4*  PLT 62*  --  66*   BMET  Recent Labs  03/12/13 0500 03/12/13 0541 03/13/13 0500  NA 139 140 140  K 4.0 3.9 4.9  CL 104 103 107  CO2 29  --  28  GLUCOSE 152* 154* 143*  BUN 7 6 8   CREATININE 0.69 0.80 0.58  CALCIUM 10.5  --  11.6*    Studies/Results: Ct Head Wo Contrast  03/12/2013   *RADIOLOGY REPORT*  Clinical Data:  Status post cervical fusion 1 day ago.  Patient unable to raise left leg.  Difficulty breathing.  CT HEAD WITHOUT CONTRAST CT CERVICAL SPINE WITHOUT CONTRAST  Technique:  Multidetector CT imaging of the head and cervical spine was performed following the standard protocol without intravenous contrast.  Multiplanar CT image reconstructions of the cervical spine were also generated.  Comparison:  Head CT scan 06/23/2012.  Cervical spine MRI 02/22/2013.  Intraoperative view of the cervical spine 03/10/2013.  CT HEAD  Findings: Soft tissue contusion is seen over the frontal bone.  No fracture is identified.  No evidence of acute intracranial abnormality including infarction, hemorrhage, mass lesion, mass effect, midline shift or abnormal extra-axial  fluid collection is identified.  There is no hydrocephalus or pneumocephalus.  Imaged paranasal sinuses and mastoid air cells are clear.  IMPRESSION: Scalp contusion over the frontal bone without underlying fracture or intracranial abnormality.  CT CERVICAL SPINE  Findings: The patient is status post C2-6 anterior discectomy and fusion.  Hardware is intact and appears well positioned.  Interbody spacers are also well-positioned.  There is no fracture or subluxation.  Gas in the soft tissues of the neck on the left is consistent with postoperative change.  Small amount of mediastinal gas is also identified. No focal fluid collection is identified. Prevertebral soft tissues appear mildly swollen.  The airway appears widely patent.  The lung apices are clear.  IMPRESSION: No acute finding.  Status post C2-6 ACDF without evidence of complication.   Original Report Authenticated By: Holley Dexter, M.D.   Ct Cervical Spine Wo Contrast  03/12/2013   *RADIOLOGY REPORT*  Clinical Data:  Status post cervical fusion 1 day ago.  Patient unable to raise left leg.  Difficulty breathing.  CT HEAD WITHOUT CONTRAST CT CERVICAL SPINE WITHOUT CONTRAST  Technique:  Multidetector CT imaging of the head and cervical spine was performed following the standard protocol without intravenous contrast.  Multiplanar CT image reconstructions of the cervical spine were also generated.  Comparison:  Head CT scan 06/23/2012.  Cervical spine MRI 02/22/2013.  Intraoperative view of the cervical spine 03/10/2013.  CT HEAD  Findings: Soft tissue contusion is seen over the frontal bone.  No fracture is identified.  No evidence of acute intracranial abnormality including infarction, hemorrhage, mass lesion, mass effect, midline shift or abnormal extra-axial fluid collection is identified.  There is no hydrocephalus or pneumocephalus.  Imaged paranasal sinuses and mastoid air cells are clear.  IMPRESSION: Scalp contusion over the frontal bone without  underlying fracture or intracranial abnormality.  CT CERVICAL SPINE  Findings: The patient is status post C2-6 anterior discectomy and fusion.  Hardware is intact and appears well positioned.  Interbody spacers are also well-positioned.  There is no fracture or subluxation.  Gas in the soft tissues of the neck on the left is consistent with postoperative change.  Small amount of mediastinal gas is also identified. No focal fluid collection is identified. Prevertebral soft tissues appear mildly swollen.  The airway appears widely patent.  The lung apices are clear.  IMPRESSION: No acute finding.  Status post C2-6 ACDF without evidence of complication.   Original Report Authenticated By: Holley Dexter, M.D.   Dg Chest Portable 1 View  03/12/2013   *RADIOLOGY REPORT*  Clinical Data: Status post fall.  PORTABLE CHEST - 1 VIEW  Comparison: PA and lateral chest 03/05/2013.  Findings: Lung volumes are slightly lower on the comparison study with mild basilar atelectasis.  No pneumothorax or pleural fluid is identified.  Heart size is upper normal.  Bilateral shoulder replacements noted.  IMPRESSION: No acute disease.   Original Report Authenticated By: Holley Dexter, M.D.    Assessment/Plan: Case discussed with nursing staff and Dr. Levy Pupa ([facility]). Dr. Delton Coombes to start the patient on clear liquids and we'll advance as tolerated. Also to begin out of bed to chair, and hopefully progress to ambulation. Stable from a neurosurgical perspective.   Hewitt Shorts, MD 03/13/2013, 8:32 AM

## 2013-03-13 NOTE — Progress Notes (Signed)
eLink Physician-Brief Progress Note Patient Name: [name] DOB: [birth year] MRN: [id number]  Date of Service  03/13/2013   HPI/Events of Note  Pain - prefers non opiates   eICU Interventions  Tylenol prn Toradol prn   Intervention Category Minor Interventions: Routine modifications to care plan (e.g. PRN medications for pain, fever)  ALVA,RAKESH V. 03/13/2013, 4:45 PM
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
## 2013-03-13 NOTE — Progress Notes (Signed)
PULMONARY  / CRITICAL CARE MEDICINE  Name: [name] MRN: [id number] DOB: Oct 30, 1960    ADMISSION DATE:  03/12/2013 CONSULTATION DATE:  03/12/2013  REFERRING MD :  Dierdre Highman PRIMARY SERVICE: PCCM  CHIEF COMPLAINT:  Fall, shortness of breath  BRIEF PATIENT DESCRIPTION: 52 y/o male with Hep C, Bipolar disorder was admitted on 8/8 from the [facility] ED for a fall after taking too many opiates at home, but then he was noticed to have stridor in the ED so PCCM was consulted.  SIGNIFICANT EVENTS / STUDIES:  8/8 CT head > scalp contusion over frontal bone, no fracture 8/8 CT spine > No acute finding, s/p C2-6 ACDF, no complication  LINES / TUBES:  CULTURES:  ANTIBIOTICS:  HISTORY OF PRESENT ILLNESS:  52 y/o male with Hep C, Bipolar disorder was admitted on 8/8 from the [facility] ED for a fall after taking too many opiates at home, but then he was noticed to have stridor in the ED so PCCM was consulted. Apparently he took 10 percocet between 6 PM and 10 PM on 8/7.  He woke up early in the morning on 8/8 with slurred speech and he fell.  He was brought in by EMS and a code stroke was called (then called off).  He was noted to have some trouble speaking and difficulty breathing with stridor, so he was given racemic epi which helped.    SUBJECTIVE:  Still with a hoarse voice.  No dyspnea  VITAL SIGNS: Temp:  [97.7 F (36.5 C)-98.8 F (37.1 C)] 98 F (36.7 C) (08/09 0800) Pulse Rate:  [48-70] 48 (08/09 1000) Resp:  [9-21] 13 (08/09 1000) BP: (123-164)/(72-93) 153/81 mmHg (08/09 1000) SpO2:  [95 %-99 %] 99 % (08/09 1000) Weight:  [73.9 kg (162 lb 14.7 oz)] 73.9 kg (162 lb 14.7 oz) (08/09 0500) HEMODYNAMICS:   VENTILATOR SETTINGS:   INTAKE / OUTPUT: Intake/Output     08/08 0701 - 08/09 0700 08/09 0701 - 08/10 0700   I.V. (mL/kg) 1608.8 (21.8) 225 (3)   Total Intake(mL/kg) 1608.8 (21.8) 225 (3)   Urine (mL/kg/hr) 2075 (1.2)    Total Output 2075     Net -466.3 +225          PHYSICAL  EXAMINATION: Gen: comfortable HEENT: L scalp facial laceration, PERRL, EOMi, OP clear, Neck collar in place, surgical dressing anterior neck c/d/i PULM: Stridor improved, no lower airway wheezing; good air movement CV: Tachy, regular, no mgr, no JVD AB: BS+, soft, nontender, no hsm Ext: warm, no edema, no clubbing, no cyanosis Derm: erythematous, psoriatic rash throughout abdomen Neuro: A&Ox4, CN II-XII intact, strength 5/5 in all 4 extremities   LABS:  CBC Recent Labs     03/12/13  0500  03/12/13  0541  03/13/13  0500  WBC  6.2   --   5.4  HGB  11.7*  12.6*  12.2*  HCT  35.2*  37.0*  37.4*  PLT  62*   --   66*   Coag's Recent Labs     03/12/13  0500  APTT  26  INR  1.06   BMET Recent Labs     03/12/13  0500  03/12/13  0541  03/13/13  0500  NA  139  140  140  K  4.0  3.9  4.9  CL  104  103  107  CO2  29   --   28  BUN  7  6  8   CREATININE  0.69  0.80  0.58  GLUCOSE  152*  154*  143*   Electrolytes Recent Labs     03/12/13  0500  03/13/13  0500  CALCIUM  10.5  11.6*   Sepsis Markers No results found for this basename: LACTICACIDVEN, PROCALCITON, O2SATVEN,  in the last 72 hours ABG No results found for this basename: PHART, PCO2ART, PO2ART,  in the last 72 hours Liver Enzymes Recent Labs     03/12/13  0500  AST  33  ALT  36  ALKPHOS  89  BILITOT  0.3  ALBUMIN  3.5   Cardiac Enzymes Recent Labs     03/12/13  0530  TROPONINI  <0.30   Glucose Recent Labs     03/12/13  0706  03/12/13  0945  GLUCAP  137*  152*    Imaging Ct Head Wo Contrast  03/12/2013   *RADIOLOGY REPORT*  Clinical Data:  Status post cervical fusion 1 day ago.  Patient unable to raise left leg.  Difficulty breathing.  CT HEAD WITHOUT CONTRAST CT CERVICAL SPINE WITHOUT CONTRAST  Technique:  Multidetector CT imaging of the head and cervical spine was performed following the standard protocol without intravenous contrast.  Multiplanar CT image reconstructions of the cervical  spine were also generated.  Comparison:  Head CT scan 06/23/2012.  Cervical spine MRI 02/22/2013.  Intraoperative view of the cervical spine 03/10/2013.  CT HEAD  Findings: Soft tissue contusion is seen over the frontal bone.  No fracture is identified.  No evidence of acute intracranial abnormality including infarction, hemorrhage, mass lesion, mass effect, midline shift or abnormal extra-axial fluid collection is identified.  There is no hydrocephalus or pneumocephalus.  Imaged paranasal sinuses and mastoid air cells are clear.  IMPRESSION: Scalp contusion over the frontal bone without underlying fracture or intracranial abnormality.  CT CERVICAL SPINE  Findings: The patient is status post C2-6 anterior discectomy and fusion.  Hardware is intact and appears well positioned.  Interbody spacers are also well-positioned.  There is no fracture or subluxation.  Gas in the soft tissues of the neck on the left is consistent with postoperative change.  Small amount of mediastinal gas is also identified. No focal fluid collection is identified. Prevertebral soft tissues appear mildly swollen.  The airway appears widely patent.  The lung apices are clear.  IMPRESSION: No acute finding.  Status post C2-6 ACDF without evidence of complication.   Original Report Authenticated By: Holley Dexter, M.D.   Ct Cervical Spine Wo Contrast  03/12/2013   *RADIOLOGY REPORT*  Clinical Data:  Status post cervical fusion 1 day ago.  Patient unable to raise left leg.  Difficulty breathing.  CT HEAD WITHOUT CONTRAST CT CERVICAL SPINE WITHOUT CONTRAST  Technique:  Multidetector CT imaging of the head and cervical spine was performed following the standard protocol without intravenous contrast.  Multiplanar CT image reconstructions of the cervical spine were also generated.  Comparison:  Head CT scan 06/23/2012.  Cervical spine MRI 02/22/2013.  Intraoperative view of the cervical spine 03/10/2013.  CT HEAD  Findings: Soft tissue contusion  is seen over the frontal bone.  No fracture is identified.  No evidence of acute intracranial abnormality including infarction, hemorrhage, mass lesion, mass effect, midline shift or abnormal extra-axial fluid collection is identified.  There is no hydrocephalus or pneumocephalus.  Imaged paranasal sinuses and mastoid air cells are clear.  IMPRESSION: Scalp contusion over the frontal bone without underlying fracture or intracranial abnormality.  CT CERVICAL SPINE  Findings: The patient is status post  C2-6 anterior discectomy and fusion.  Hardware is intact and appears well positioned.  Interbody spacers are also well-positioned.  There is no fracture or subluxation.  Gas in the soft tissues of the neck on the left is consistent with postoperative change.  Small amount of mediastinal gas is also identified. No focal fluid collection is identified. Prevertebral soft tissues appear mildly swollen.  The airway appears widely patent.  The lung apices are clear.  IMPRESSION: No acute finding.  Status post C2-6 ACDF without evidence of complication.   Original Report Authenticated By: Holley Dexter, M.D.   Dg Chest Portable 1 View  03/12/2013   *RADIOLOGY REPORT*  Clinical Data: Status post fall.  PORTABLE CHEST - 1 VIEW  Comparison: PA and lateral chest 03/05/2013.  Findings: Lung volumes are slightly lower on the comparison study with mild basilar atelectasis.  No pneumothorax or pleural fluid is identified.  Heart size is upper normal.  Bilateral shoulder replacements noted.  IMPRESSION: No acute disease.   Original Report Authenticated By: Holley Dexter, M.D.     ASSESSMENT / PLAN:  PULMONARY A: Stridor > CT neck reviewed, no clear sign of tracheal compression; ENT eval > posterior pharyngeal edema but no cord injury P:   -continue ICU for observation 1 more day, to SDU on 8/10 -decadron 4mg  q6h -OOB and ambulate > OK w Dr Newell Coral  CARDIOVASCULAR A: Hypertension P:  -continue home  medications  RENAL A:  No acute issues P:   -monitor UOP  GASTROINTESTINAL A:  Hep C, no acute issues P:   -advance diet   HEMATOLOGIC A:  Thrombocytopenia, no sign of bleeding P:  -monitor CBC  INFECTIOUS A:  No acute issues P:   -monitor fever curve  ENDOCRINE A:  No acute issues P:   -monitor glucose  NEUROLOGIC A:  S/P C2-3,3-4,4-5,5-6 ACDF Fall > due to encephalopathy from narcotic overdose BIPOLAR disorder P:   -stable NSGY eval, appreciate Dr Earl Gala help -restart home seroquel, zoloft, Lithium, trazadone  TODAY'S SUMMARY:   I have personally obtained a history, examined the patient, evaluated laboratory and imaging results, formulated the assessment and plan and placed orders.   Levy Pupa, MD, PhD 03/13/2013, 10:41 AM [facility] [phone] or if no answer [phone]

## 2013-03-14 DIAGNOSIS — F319 Bipolar disorder, unspecified: Secondary | ICD-10-CM

## 2013-03-14 LAB — BASIC METABOLIC PANEL
CO2: 27 mEq/L (ref 19–32)
Chloride: 103 mEq/L (ref 96–112)
Potassium: 4.6 mEq/L (ref 3.5–5.1)
Sodium: 137 mEq/L (ref 135–145)

## 2013-03-14 MED ORDER — DEXAMETHASONE SODIUM PHOSPHATE 4 MG/ML IJ SOLN
4.0000 mg | Freq: Four times a day (QID) | INTRAMUSCULAR | Status: DC
  Administered 2013-03-14 – 2013-03-16 (×7): 4 mg via INTRAVENOUS
  Filled 2013-03-14 (×11): qty 1

## 2013-03-14 NOTE — Progress Notes (Signed)
PULMONARY  / CRITICAL CARE MEDICINE  Name: [name] MRN: [id number] DOB: January 15, 1961    ADMISSION DATE:  03/12/2013 CONSULTATION DATE:  03/12/2013  REFERRING MD :  Dierdre Highman PRIMARY SERVICE: PCCM  CHIEF COMPLAINT:  Fall, shortness of breath  BRIEF PATIENT DESCRIPTION: 52 y/o male with Hep C, Bipolar disorder was admitted on 8/8 from the [facility] ED for a fall after taking too many opiates at home, but then he was noticed to have stridor in the ED so PCCM was consulted.  SIGNIFICANT EVENTS / STUDIES:  8/8 CT head > scalp contusion over frontal bone, no fracture 8/8 CT spine > No acute finding, s/p C2-6 ACDF, no complication  LINES / TUBES:  CULTURES:  ANTIBIOTICS:  HISTORY OF PRESENT ILLNESS:  52 y/o male with Hep C, Bipolar disorder was admitted on 8/8 from the [facility] ED for a fall after taking too many opiates at home, but then he was noticed to have stridor in the ED so PCCM was consulted. Apparently he took 10 percocet between 6 PM and 10 PM on 8/7.  He woke up early in the morning on 8/8 with slurred speech and he fell.  He was brought in by EMS and a code stroke was called (then called off).  He was noted to have some trouble speaking and difficulty breathing with stridor, so he was given racemic epi which helped.    SUBJECTIVE:  Tolerated PO diet and mobilization  VITAL SIGNS: Temp:  [97.5 F (36.4 C)-98.7 F (37.1 C)] 97.5 F (36.4 C) (08/10 0738) Pulse Rate:  [47-127] 55 (08/10 0900) Resp:  [9-22] 13 (08/10 0900) BP: (119-164)/(45-99) 147/80 mmHg (08/10 0900) SpO2:  [87 %-100 %] 96 % (08/10 0900) HEMODYNAMICS:   VENTILATOR SETTINGS:   INTAKE / OUTPUT: Intake/Output     08/09 0701 - 08/10 0700 08/10 0701 - 08/11 0700   P.O. 240    I.V. (mL/kg) 1575 (21.3) 150 (2)   Total Intake(mL/kg) 1815 (24.6) 150 (2)   Urine (mL/kg/hr) 2050 (1.2)    Total Output 2050     Net -235 +150          PHYSICAL EXAMINATION: Gen: comfortable HEENT: L scalp facial laceration, PERRL,  EOMi, OP clear, Neck collar in place, surgical dressing anterior neck c/d/i PULM: Stridor improved, no lower airway wheezing; good air movement CV: Tachy, regular, no mgr, no JVD AB: BS+, soft, nontender, no hsm Ext: warm, no edema, no clubbing, no cyanosis Derm: erythematous, psoriatic rash throughout abdomen Neuro: A&Ox4, CN II-XII intact, strength 5/5 in all 4 extremities   LABS:  CBC Recent Labs     03/12/13  0500  03/12/13  0541  03/13/13  0500  WBC  6.2   --   5.4  HGB  11.7*  12.6*  12.2*  HCT  35.2*  37.0*  37.4*  PLT  62*   --   66*   Coag's Recent Labs     03/12/13  0500  APTT  26  INR  1.06   BMET Recent Labs     03/12/13  0500  03/12/13  0541  03/13/13  0500  03/14/13  0435  NA  139  140  140  137  K  4.0  3.9  4.9  4.6  CL  104  103  107  103  CO2  29   --   28  27  BUN  7  6  8  17   CREATININE  0.69  0.80  0.58  0.63  GLUCOSE  152*  154*  143*  128*   Electrolytes Recent Labs     03/12/13  0500  03/13/13  0500  03/14/13  0435  CALCIUM  10.5  11.6*  11.5*   Sepsis Markers No results found for this basename: LACTICACIDVEN, PROCALCITON, O2SATVEN,  in the last 72 hours ABG No results found for this basename: PHART, PCO2ART, PO2ART,  in the last 72 hours Liver Enzymes Recent Labs     03/12/13  0500  AST  33  ALT  36  ALKPHOS  89  BILITOT  0.3  ALBUMIN  3.5   Cardiac Enzymes Recent Labs     03/12/13  0530  TROPONINI  <0.30   Glucose Recent Labs     03/12/13  0706  03/12/13  0945  GLUCAP  137*  152*    Imaging No results found.   ASSESSMENT / PLAN:  PULMONARY A: Stridor > CT neck reviewed, no clear sign of tracheal compression; ENT eval > posterior pharyngeal edema but no cord injury P:   -will move to SDU 8/10 -decadron 4mg  q6h x 1 more day, then taper quickly -OOB and ambulate > OK w Dr Newell Coral  CARDIOVASCULAR A: Hypertension Bradycardia P:  -continue home medications -ECG to check QTc, especially since on  seroquel  RENAL A:  No acute issues P:   -monitor UOP  GASTROINTESTINAL A:  Hep C, no acute issues P:   -advance diet   HEMATOLOGIC A:  Thrombocytopenia, no sign of bleeding P:  -monitor CBC  INFECTIOUS A:  No acute issues P:   -monitor fever curve  ENDOCRINE A:  No acute issues P:   -monitor glucose  NEUROLOGIC A:  S/P C2-3,3-4,4-5,5-6 ACDF Fall > due to encephalopathy from narcotic overdose BIPOLAR disorder P:   -stable NSGY eval, appreciate Dr Earl Gala help -restarted home seroquel, zoloft, Lithium, trazadone  TODAY'S SUMMARY: To SDU, wean steroids tomorrow and hopefully home soon.   I have personally obtained a history, examined the patient, evaluated laboratory and imaging results, formulated the assessment and plan and placed orders.   Levy Pupa, MD, PhD 03/14/2013, 10:07 AM [facility] [phone] or if no answer [phone]

## 2013-03-14 NOTE — Progress Notes (Signed)
Subjective: Patient without complaints. Reports that he did well with initial by mouth and beginning out of bed.  Objective: Vital signs in last 24 hours: Filed Vitals:   03/14/13 0400 03/14/13 0453 03/14/13 0500 03/14/13 0600  BP: 146/79  159/97 133/74  Pulse: 57  50 47  Temp:  98.7 F (37.1 C)    TempSrc:  Oral    Resp: 17  15 10   Height:      Weight:      SpO2: 99%  100% 99%    Intake/Output from previous day: 08/09 0701 - 08/10 0700 In: 1740 [P.O.:240; I.V.:1500] Out: 2050 [Urine:2050] Intake/Output this shift:    Physical Exam:  Wound healing well. Still some minimal drainage from drain site, dressing changes. Moving all short as well.  CBC  Recent Labs  03/12/13 0500 03/12/13 0541 03/13/13 0500  WBC 6.2  --  5.4  HGB 11.7* 12.6* 12.2*  HCT 35.2* 37.0* 37.4*  PLT 62*  --  66*   BMET  Recent Labs  03/13/13 0500 03/14/13 0435  NA 140 137  K 4.9 4.6  CL 107 103  CO2 28 27  GLUCOSE 143* 128*  BUN 8 17  CREATININE 0.58 0.63  CALCIUM 11.6* 11.5*    Assessment/Plan: Diet and activities can be advanced from a neurosurgical perspective. Probably can begin to wean Decadron.   Hewitt Shorts, MD 03/14/2013, 7:32 AM

## 2013-03-15 ENCOUNTER — Encounter (HOSPITAL_COMMUNITY): Payer: Self-pay | Admitting: Neurosurgery

## 2013-03-15 DIAGNOSIS — F17200 Nicotine dependence, unspecified, uncomplicated: Secondary | ICD-10-CM

## 2013-03-15 MED ORDER — OXYCODONE-ACETAMINOPHEN 5-325 MG PO TABS
1.0000 | ORAL_TABLET | Freq: Four times a day (QID) | ORAL | Status: DC | PRN
  Administered 2013-03-15 – 2013-03-16 (×4): 1 via ORAL
  Filled 2013-03-15 (×4): qty 1

## 2013-03-15 NOTE — Care Management Note (Unsigned)
    Page 1 of 1   03/15/2013     5:45:37 PM   CARE MANAGEMENT NOTE 03/15/2013  Patient:  [name],[name]   Account Number:  [id number]  Date Initiated:  03/15/2013  Documentation initiated by:  Letha Cape  Subjective/Objective Assessment:   dx stridor, Od  admit-lives with spouse.     Action/Plan:   Anticipated DC Date:  03/17/2013   Anticipated DC Plan:  HOME W HOME HEALTH SERVICES      DC Planning Services  CM consult      Choice offered to / List presented to:             Status of service:  In process, will continue to follow Medicare Important Message given?   (If response is "NO", the following Medicare IM given date fields will be blank) Date Medicare IM given:   Date Additional Medicare IM given:    Discharge Disposition:    Per UR Regulation:    If discussed at Long Length of Stay Meetings, dates discussed:    Comments:

## 2013-03-15 NOTE — Progress Notes (Signed)
[facility] PROGRESS NOTE  [name] ZOX:[id number] DOB: November 14, 1960 DOA: 03/12/2013 PCP: Arlan Organ., MD  HPI/Subjective: Has some neck pain, doesn't feel comfortable.  Assessment/Plan:  Drug overdose -Patient was taking too many Percocet tablets for neck pain after next urge. -Status post ACDF on 8/6. -Restart Percocet to lower dose in the hospital. No prescription will be given. -Patient denies any intention to harm himself, he said he was hurting but when he took extra pain medications.  Stridor -ENT was consulted and direct laryngoscopy showed posterior pharyngeal edema/left neck edema. -Decadron is recommended, also recommended quick taper. -This is improved today.  Status post ACDF -Done by Dr. Newell Coral, he evaluated the patient and recommended ambulation. -Pain to be controlled by Percocet.  Hep C -Stable and chronic condition.  Bipolar affective disorder -Continue preadmission oral medications.  Code Status: Full code Family Communication: Plan discussed with patient Disposition Plan: Remain inpatient   Consultants:  [facility] assumed care on 03/15/13  Procedures:  None  Antibiotics:  None    Objective: Filed Vitals:   03/15/13 0508  BP: 148/95  Pulse: 52  Temp: 98.4 F (36.9 C)  Resp: 18    Intake/Output Summary (Last 24 hours) at 03/15/13 1517 Last data filed at 03/15/13 0917  Gross per 24 hour  Intake    420 ml  Output    850 ml  Net   -430 ml   Filed Weights   03/13/13 0500 03/14/13 1613 03/15/13 0508  Weight: 73.9 kg (162 lb 14.7 oz) 71.5 kg (157 lb 10.1 oz) 69.9 kg (154 lb 1.6 oz)    Exam: General: Alert and awake, oriented x3, not in any acute distress. HEENT: anicteric sclera, pupils reactive to light and accommodation, EOMI CVS: S1-S2 clear, no murmur rubs or gallops Chest: clear to auscultation bilaterally, no wheezing, rales or rhonchi Abdomen: soft nontender, nondistended, normal bowel sounds, no  organomegaly Extremities: no cyanosis, clubbing or edema noted bilaterally Neuro: Cranial nerves II-XII intact, no focal neurological deficits  Data Reviewed: Basic Metabolic Panel:  Recent Labs Lab 03/12/13 0500 03/12/13 0541 03/13/13 0500 03/14/13 0435  NA 139 140 140 137  K 4.0 3.9 4.9 4.6  CL 104 103 107 103  CO2 29  --  28 27  GLUCOSE 152* 154* 143* 128*  BUN 7 6 8 17   CREATININE 0.69 0.80 0.58 0.63  CALCIUM 10.5  --  11.6* 11.5*   Liver Function Tests:  Recent Labs Lab 03/12/13 0500  AST 33  ALT 36  ALKPHOS 89  BILITOT 0.3  PROT 7.0  ALBUMIN 3.5   No results found for this basename: LIPASE, AMYLASE,  in the last 168 hours No results found for this basename: AMMONIA,  in the last 168 hours CBC:  Recent Labs Lab 03/12/13 0500 03/12/13 0541 03/13/13 0500  WBC 6.2  --  5.4  NEUTROABS 5.1  --   --   HGB 11.7* 12.6* 12.2*  HCT 35.2* 37.0* 37.4*  MCV 104.8*  --  105.1*  PLT 62*  --  66*   Cardiac Enzymes:  Recent Labs Lab 03/12/13 0530  TROPONINI <0.30   BNP (last 3 results) No results found for this basename: PROBNP,  in the last 8760 hours CBG:  Recent Labs Lab 03/12/13 0706 03/12/13 0945  GLUCAP 137* 152*    Recent Results (from the past 240 hour(s))  SURGICAL PCR SCREEN     Status: None   Collection Time    03/05/13  3:57 PM  Result Value Range Status   MRSA, PCR NEGATIVE  NEGATIVE Final   Staphylococcus aureus NEGATIVE  NEGATIVE Final   Comment:            The Xpert SA Assay (FDA     approved for NASAL specimens     in patients over 9 years of age),     is one component of     a comprehensive surveillance     program.  Test performance has     been validated by [employer] for patients greater     than or equal to 18 year old.     It is not intended     to diagnose infection nor to     guide or monitor treatment.  MRSA PCR SCREENING     Status: None   Collection Time    03/12/13  9:23 AM      Result Value Range  Status   MRSA by PCR NEGATIVE  NEGATIVE Final   Comment:            The GeneXpert MRSA Assay (FDA     approved for NASAL specimens     only), is one component of a     comprehensive MRSA colonization     surveillance program. It is not     intended to diagnose MRSA     infection nor to guide or     monitor treatment for     MRSA infections.     Studies: No results found.  Scheduled Meds: . amLODipine  10 mg Oral Daily  . cloNIDine  0.3 mg Oral BID  . dexamethasone  4 mg Intravenous Q6H  . gabapentin  800 mg Oral TID  . lithium carbonate  300 mg Oral Daily  . lithium carbonate  600 mg Oral QHS  . sertraline  100 mg Oral Daily  . traZODone  200 mg Oral QHS   Continuous Infusions:   Active Problems:   HEPATITIS C   BIPOLAR AFFECTIVE DISORDER   HYPERTENSION   Stridor   Narcotic overdose    Time spent: 35 minutes    [facility] A  [facility] Pager [phone]. If 7PM-7AM, please contact night-coverage at [email or web address], password [facility] 03/15/2013, 3:17 PM  LOS: 3 days

## 2013-03-15 NOTE — Progress Notes (Signed)
Physical Therapy Treatment Patient Details Name: [name] MRN: [id number] DOB: [birth year] Today's Date: 03/15/2013 Time: 4098-1191 PT Time Calculation (min): 28 min  PT Assessment / Plan / Recommendation  History of Present Illness Pt underwent ACDF, was home 3 days and fell. Apparently narcotic induced but pt with respiratory issues so readmitted.    PT Comments   Pt progressing towards physical therapy goals. Pt was able to perform functional mobility and ambulation with decreased assist than previous sessions. Pt required increased cueing for posture, as pt demonstrated cervical flexion and rounded shoulders frequently when OOB. He also required pt education regarding fit of cervical collar, and therapist adjusted side straps so collar would fit snugly. Pt reports he wants it to be loose around his neck. He continues to be distracted and somewhat impulsive during session, and requires increased cueing for sequencing and timing for safety.  Follow Up Recommendations  Home health PT (Pt has refused ALF per care management)     Does the patient have the potential to tolerate intense rehabilitation     Barriers to Discharge        Equipment Recommendations  None recommended by PT    Recommendations for Other Services    Frequency Min 3X/week   Progress towards PT Goals Progress towards PT goals: Progressing toward goals  Plan Current plan remains appropriate    Precautions / Restrictions Precautions Precautions: Fall Precaution Comments: pt is bipolar and reports that he "stumbles a lot" and is "always confused" Required Braces or Orthoses: Cervical Brace Cervical Brace: Hard collar Restrictions Weight Bearing Restrictions: No   Pertinent Vitals/Pain Pt reports unrated cervical pain throughout session, stating the doctor is to put an order in for pain medication. Per nursing, no new order had been placed at that time, and offered pt another pain reliever that was available,  however pt declined and stated he wanted to "wait for the percocet".     Mobility  Bed Mobility Bed Mobility: Sit to Supine Sit to Supine: 6: Modified independent (Device/Increase time);HOB elevated Details for Bed Mobility Assistance: Pt was able to perform bed mobility and sit EOB with use of bedrails and cueing for cervical posture Transfers Transfers: Sit to Stand;Stand to Sit Sit to Stand: 5: Supervision;From bed Stand to Sit: 4: Min guard;To chair/3-in-1 Details for Transfer Assistance: Pt was cued for controlled descent into bedside chair Ambulation/Gait Ambulation/Gait Assistance: 4: Min guard Ambulation Distance (Feet): 200 Feet Assistive device: Rolling walker Ambulation/Gait Assistance Details: Pt was cued for walker placement close to body during ambulation, and required frequent cues for posture and cervical position as pt wanted to look down and flex cervical spine often. Gait Pattern: Step-through pattern Gait velocity: WFL Stairs: No    Exercises     PT Diagnosis:    PT Problem List:   PT Treatment Interventions:     PT Goals (current goals can now be found in the care plan section) Acute Rehab PT Goals Patient Stated Goal: return home PT Goal Formulation: With patient Time For Goal Achievement: 03/27/13 Potential to Achieve Goals: Good  Visit Information  Last PT Received On: 03/15/13 Assistance Needed: +1 History of Present Illness: Pt underwent ACDF, was home 3 days and fell. Apparently narcotic induced but pt with respiratory issues so readmitted.     Subjective Data  Subjective: "I've been waiting to get up and walk around today." Patient Stated Goal: return home   Cognition  Cognition Arousal/Alertness: Awake/alert Behavior During Therapy: Anxious Overall Cognitive Status:  Impaired/Different from baseline ((not sure of pt's baseline)) Area of Impairment: Safety/judgement Memory: Decreased short-term memory Safety/Judgement: Decreased awareness  of safety General Comments: Pt continues to be easily distracted and impulsive throughout session    Balance  Balance Balance Assessed: Yes Static Sitting Balance Static Sitting - Balance Support: No upper extremity supported;Feet supported Static Sitting - Level of Assistance: 7: Independent Static Sitting - Comment/# of Minutes: 2 Static Standing Balance Static Standing - Balance Support: Bilateral upper extremity supported Static Standing - Level of Assistance: 6: Modified independent (Device/Increase time) Static Standing - Comment/# of Minutes: 1  End of Session PT - End of Session Equipment Utilized During Treatment: Gait belt;Cervical collar Activity Tolerance: Patient tolerated treatment well Patient left: in chair;with call bell/phone within reach   GP     Ruthann Cancer 03/15/2013, 12:28 PM  Ruthann Cancer, PT Acute Rehabilitation Services
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
## 2013-03-16 MED ORDER — ACETAMINOPHEN 325 MG PO TABS: 650.0000 mg | ORAL_TABLET | ORAL | Status: AC | PRN

## 2013-03-16 MED ORDER — OXYCODONE-ACETAMINOPHEN 5-325 MG PO TABS: 1.0000 | ORAL_TABLET | Freq: Three times a day (TID) | ORAL | Status: DC | PRN

## 2013-03-16 MED ORDER — DEXAMETHASONE 1 MG PO TABS: 2.0000 mg | ORAL_TABLET | Freq: Two times a day (BID) | ORAL | Status: DC

## 2013-03-16 MED ORDER — AMLODIPINE BESYLATE 5 MG PO TABS: 10.0000 mg | ORAL_TABLET | Freq: Every day | ORAL | Status: DC

## 2013-03-16 NOTE — Discharge Summary (Signed)
Physician Discharge Summary  BINGHAM MILLETTE MVH:[id number] DOB: 1961-02-25 DOA: 03/12/2013  PCP: Arlan Organ., MD  Admit date: 03/12/2013 Discharge date: 03/16/2013  Time spent: 45 minutes  Recommendations for Outpatient Follow-up:  1. Home health PT and RN (med management) 2. Follow up with Aleatha Borer, MD in 2 weeks 3. Follow up with Shirlean Kelly, MD in 2 weeks 4. Have ordered rolling walker for use at home.  Discharge Diagnoses:  Active Problems:   HEPATITIS C   BIPOLAR AFFECTIVE DISORDER   HYPERTENSION   Stridor   Narcotic overdose   Discharge Condition: Stable  Diet recommendation: Regular Diet  Filed Weights   03/14/13 1613 03/15/13 0508 03/16/13 0545  Weight: 71.5 kg (157 lb 10.1 oz) 69.9 kg (154 lb 1.6 oz) 70.6 kg (155 lb 10.3 oz)   Day of discharge:   Neck pain 6 days post anterior cervical spine fusion.  He rates the pain at a 7.5 this AM.  He has no SOB, abdominal pain, N/V, or edema.  He says he feels well, other than the neck pain, and is ready to go home.  HPI / Hospital Course:  Stridor Stridor was found when the patient presented to the ED after a fall 2/2 narcotic OD.  PCCM was consulted and he was given racemic epi and his stridor improved.   ENT was consulted and direct laryngoscopy showed posterior pharyngeal edema/left neck edema.  He was given Decedron and his stridor has improved markedly.  He will go home on a quick 6 day Decadron taper.  Drug Overdose Patient took 10 Percocet over 4 hours on 8/7 (post ACDF on 8/6).  He woke up early on 8/8 with slurred speech and fell and hit his head.  His wife called EMS and he was brought to the ED. He denies any intention of harming himself- he says he was in pain so he took the extra medications.  His Percocet was stopped until 8/11 when it was restarted at a lower dose. No narcotic prescription will be given at the time of discharge.  It is recommended that his pain be controlled with ibuprofen and  tylenol.  Hepatitis C This is a stable and chronic condition, with no acute issues during his hospital stay.  Hypertension The patient continued on his home medications (Amlodipine, clonidine) and his BP remained stable but elevated.  Amlodipine dosage was increased from 5 mg to 10 mg daily.  Bipolar Affective Disorder The patient continued his preadmission PO medications (lithium, seroquel, zoloft, trazodone) and has remained stable.   Procedures:  None  Consultations:  Otolaryngology  Discharge Exam: Filed Vitals:   03/16/13 0545  BP: 147/96  Pulse: 53  Temp: 98.2 F (36.8 C)  Resp: 18    General: Alert and awake, oriented x 3, NAD.  In hard cervical collar. Cardiovascular: RRR no MGR Respiratory: CTAB, no wheezing/rales/rhonchi Abdomen: soft, nt, nd, normal bs, no organomegaly Extremities: no cyanosis, clubbing, or edema Neuro: CN 2-12 grossly in tact, non-focal Skin: psoriatic rash and scale on face, abdomen and LE's  Discharge Instructions      Discharge Orders   Future Orders Complete By Expires     Diet - low sodium heart healthy  As directed     Increase activity slowly  As directed         Medication List         acetaminophen 325 MG tablet  Commonly known as:  TYLENOL  Take 2 tablets (650 mg total) by  mouth every 4 (four) hours as needed.     amLODipine 5 MG tablet  Commonly known as:  NORVASC  Take 2 tablets (10 mg total) by mouth daily.     clobetasol ointment 0.05 %  Commonly known as:  TEMOVATE  Apply 1 application topically daily as needed. Apply affected areas for psoriasis     cloNIDine 0.3 MG tablet  Commonly known as:  CATAPRES  Take 0.3 mg by mouth 2 (two) times daily.     dexamethasone 1 MG tablet  Commonly known as:  DECADRON  - Take 2 tablets (2 mg total) by mouth 2 (two) times daily with a meal. Take 2 tabs twice daily with meals for two days  - Then take 1 tab twice daily with meals for two days  - Then take 1 tab  daily with breakfast for two days and stop.     gabapentin 800 MG tablet  Commonly known as:  NEURONTIN  Take 800 mg by mouth 3 (three) times daily.     lithium carbonate 300 MG capsule  Take 300-600 mg by mouth 2 (two) times daily. Take 300 mg in the morning and 600mg  at night     oxyCODONE-acetaminophen 5-325 MG per tablet  Commonly known as:  PERCOCET/ROXICET  Take 1 tablet by mouth every 8 (eight) hours as needed for pain.     QUEtiapine 100 MG tablet  Commonly known as:  SEROQUEL  Take 200 mg by mouth at bedtime as needed (for sleep or aggitation).     sertraline 100 MG tablet  Commonly known as:  ZOLOFT  Take 100 mg by mouth daily.     traZODone 100 MG tablet  Commonly known as:  DESYREL  Take 200 mg by mouth at bedtime.       Allergies  Allergen Reactions  . Erythromycin     REACTION: GI upset  . Hydromorphone Hcl     REACTION: itching  . Prochlorperazine Maleate     REACTION: jaw lock   Follow-up Information   Follow up with MANNING, Adelene Amas., MD. Schedule an appointment as soon as possible for a visit in 2 weeks.   Contact information:   [address] [place] [address] [phone]       Follow up with Hewitt Shorts, MD. Schedule an appointment as soon as possible for a visit in 2 weeks.   Contact information:   [address] [place] [address] [phone]        The results of significant diagnostics from this hospitalization (including imaging, microbiology, ancillary and laboratory) are listed below for reference.    Significant Diagnostic Studies: Dg Chest 2 View  03/05/2013   *RADIOLOGY REPORT*  Clinical Data: Preoperative for ACDF  CHEST - 2 VIEW  Comparison: Dec 14, 2011  Findings: There is no focal infiltrate, pulmonary edema, or pleural effusion.  The mediastinal contour and cardiac silhouette are normal.  The soft tissues and osseous structures are stable.  IMPRESSION: No  acute cardiopulmonary disease identified.   Original Report Authenticated By: Sherian Rein, M.D.   Dg Cervical Spine 1 View  03/10/2013   *RADIOLOGY REPORT*  Clinical Data: Degenerative disc disease, radiculopathy  DG C-ARM 1-60 MIN, DG CERVICAL SPINE - 1 VIEW  Technique: Intraoperative spot radiographs  Comparison:  Intraoperative radiographs obtained earlier today at 13 23.  Findings: Single cross-table lateral radiograph demonstrates surgical changes of anterior cervical discectomy and fusion extending  from C2-C6 with intervertebral spacers at all levels.  No evidence of immediate hardware complication.  The patient remains intubated.  A thermistor projects over the hypopharynx.  IMPRESSION:  C2-C6 ACDF as above.   Original Report Authenticated By: Malachy Moan, M.D.   Dg Cervical Spine 1 View  03/10/2013   *RADIOLOGY REPORT*  Clinical Data: Opening film for C3-C6 ACDF  DG CERVICAL SPINE - 1 VIEW  Comparison: Preoperative MRI cervical spine 02/22/2013  Findings: Single cross-table lateral radiograph demonstrates a metallic marker at the C2-C3 interspace.  Soft tissue spreaders noted over the anterior neck.  The patient is intubated. Multilevel cervical spondylosis.  IMPRESSION: Intraoperative localization radiograph as above.   Original Report Authenticated By: Malachy Moan, M.D.   Ct Head Wo Contrast  03/12/2013   *RADIOLOGY REPORT*  Clinical Data:  Status post cervical fusion 1 day ago.  Patient unable to raise left leg.  Difficulty breathing.  CT HEAD WITHOUT CONTRAST CT CERVICAL SPINE WITHOUT CONTRAST  Technique:  Multidetector CT imaging of the head and cervical spine was performed following the standard protocol without intravenous contrast.  Multiplanar CT image reconstructions of the cervical spine were also generated.  Comparison:  Head CT scan 06/23/2012.  Cervical spine MRI 02/22/2013.  Intraoperative view of the cervical spine 03/10/2013.  CT HEAD  Findings: Soft tissue contusion is seen  over the frontal bone.  No fracture is identified.  No evidence of acute intracranial abnormality including infarction, hemorrhage, mass lesion, mass effect, midline shift or abnormal extra-axial fluid collection is identified.  There is no hydrocephalus or pneumocephalus.  Imaged paranasal sinuses and mastoid air cells are clear.  IMPRESSION: Scalp contusion over the frontal bone without underlying fracture or intracranial abnormality.  CT CERVICAL SPINE  Findings: The patient is status post C2-6 anterior discectomy and fusion.  Hardware is intact and appears well positioned.  Interbody spacers are also well-positioned.  There is no fracture or subluxation.  Gas in the soft tissues of the neck on the left is consistent with postoperative change.  Small amount of mediastinal gas is also identified. No focal fluid collection is identified. Prevertebral soft tissues appear mildly swollen.  The airway appears widely patent.  The lung apices are clear.  IMPRESSION: No acute finding.  Status post C2-6 ACDF without evidence of complication.   Original Report Authenticated By: Holley Dexter, M.D.   Ct Cervical Spine Wo Contrast  03/12/2013   *RADIOLOGY REPORT*  Clinical Data:  Status post cervical fusion 1 day ago.  Patient unable to raise left leg.  Difficulty breathing.  CT HEAD WITHOUT CONTRAST CT CERVICAL SPINE WITHOUT CONTRAST  Technique:  Multidetector CT imaging of the head and cervical spine was performed following the standard protocol without intravenous contrast.  Multiplanar CT image reconstructions of the cervical spine were also generated.  Comparison:  Head CT scan 06/23/2012.  Cervical spine MRI 02/22/2013.  Intraoperative view of the cervical spine 03/10/2013.  CT HEAD  Findings: Soft tissue contusion is seen over the frontal bone.  No fracture is identified.  No evidence of acute intracranial abnormality including infarction, hemorrhage, mass lesion, mass effect, midline shift or abnormal extra-axial  fluid collection is identified.  There is no hydrocephalus or pneumocephalus.  Imaged paranasal sinuses and mastoid air cells are clear.  IMPRESSION: Scalp contusion over the frontal bone without underlying fracture or intracranial abnormality.  CT CERVICAL SPINE  Findings: The patient is status post C2-6 anterior discectomy and fusion.  Hardware is intact and appears well positioned.  Interbody spacers are also well-positioned.  There is no fracture or subluxation.  Gas in the soft tissues of the neck on the left is consistent with postoperative change.  Small amount of mediastinal gas is also identified. No focal fluid collection is identified. Prevertebral soft tissues appear mildly swollen.  The airway appears widely patent.  The lung apices are clear.  IMPRESSION: No acute finding.  Status post C2-6 ACDF without evidence of complication.   Original Report Authenticated By: Holley Dexter, M.D.   Mr Cervical Spine Wo Contrast  02/23/2013   *RADIOLOGY REPORT*  Clinical Data: Neck pain  MRI CERVICAL SPINE WITHOUT CONTRAST  Technique:  Multiplanar and multiecho pulse sequences of the cervical spine, to include the craniocervical junction and cervicothoracic junction, were obtained according to standard protocol without intravenous contrast.  Comparison: Prior radiograph from 02/16/2013  Findings: There is reversal of the normal cervical lordosis with apex at the C4-5 level. Trace anterolisthesis of C3 and C4 is present.  Degenerative intervertebral disc space narrowing is seen at C4-5 and C5-6 with associated degenerative endplate signal changes.  Otherwise, signal intensity from the vertebral body bone marrow is normal.  Signal intensity from the spinal cord is normal. The visualized posterior fossa is grossly unremarkable.  At C2-3, there is mild degenerative disc osteophyte complex without significant canal or neural foraminal stenosis.  At C3-4, there is broad-based diffuse degenerative disc osteophyte  with bilateral facet arthrosis.  There is resultant moderate bilateral foraminal narrowing, left worse than right.  No central canal stenosis.  At C4-5, there is broad-based degenerative disc osteophyte with right greater than left facet arthrosis and uncovertebral osteophytosis.  There is severe right with mild left foraminal narrowing.  No significant central canal stenosis.  At C5-6, there is broad-based degenerative disc osteophyte with bilateral facet arthrosis and uncovertebral osteophytosis.  There is severe bilateral foraminal narrowing, right worse than left.  At C6-7, there is mild degenerative disc osteophyte without significant canal or neural foraminal stenosis.  At C7 - T1, there is left-sided uncovertebral osteophytosis and facet arthrosis resulting in mild to moderate left foraminal narrowing.  No central canal stenosis.  IMPRESSION: 1.  Degenerative disc osteophyte with facet arthropathy at C4-5 with resultant severe right neural foraminal stenosis. 2.  Degenerative disc osteophyte and bilateral facet arthrosis at C5-6 resulting in severe bilateral foraminal narrowing, right worse than left. 3.  Additional multilevel degenerative disc disease as above.  No significant central canal stenosis identified in the cervical spine.   Original Report Authenticated By: Rise Mu, M.D.   Dg Chest Portable 1 View  03/12/2013   *RADIOLOGY REPORT*  Clinical Data: Status post fall.  PORTABLE CHEST - 1 VIEW  Comparison: PA and lateral chest 03/05/2013.  Findings: Lung volumes are slightly lower on the comparison study with mild basilar atelectasis.  No pneumothorax or pleural fluid is identified.  Heart size is upper normal.  Bilateral shoulder replacements noted.  IMPRESSION: No acute disease.   Original Report Authenticated By: Holley Dexter, M.D.   Dg C-arm 1-60 Min  03/10/2013   *RADIOLOGY REPORT*  Clinical Data: Degenerative disc disease, radiculopathy  DG C-ARM 1-60 MIN, DG CERVICAL SPINE - 1  VIEW  Technique: Intraoperative spot radiographs  Comparison:  Intraoperative radiographs obtained earlier today at 13 23.  Findings: Single cross-table lateral radiograph demonstrates surgical changes of anterior cervical discectomy and fusion extending from C2-C6 with intervertebral spacers at all levels.  No evidence of immediate hardware complication.  The patient remains intubated.  A thermistor projects over the hypopharynx.  IMPRESSION:  C2-C6 ACDF as above.   Original Report Authenticated By: Malachy Moan, M.D.    Microbiology: Recent Results (from the past 240 hour(s))  MRSA PCR SCREENING     Status: None   Collection Time    03/12/13  9:23 AM      Result Value Range Status   MRSA by PCR NEGATIVE  NEGATIVE Final   Comment:            The GeneXpert MRSA Assay (FDA     approved for NASAL specimens     only), is one component of a     comprehensive MRSA colonization     surveillance program. It is not     intended to diagnose MRSA     infection nor to guide or     monitor treatment for     MRSA infections.     Labs: Basic Metabolic Panel:  Recent Labs Lab 03/12/13 0500 03/12/13 0541 03/13/13 0500 03/14/13 0435  NA 139 140 140 137  K 4.0 3.9 4.9 4.6  CL 104 103 107 103  CO2 29  --  28 27  GLUCOSE 152* 154* 143* 128*  BUN 7 6 8 17   CREATININE 0.69 0.80 0.58 0.63  CALCIUM 10.5  --  11.6* 11.5*   Liver Function Tests:  Recent Labs Lab 03/12/13 0500  AST 33  ALT 36  ALKPHOS 89  BILITOT 0.3  PROT 7.0  ALBUMIN 3.5   CBC:  Recent Labs Lab 03/12/13 0500 03/12/13 0541 03/13/13 0500  WBC 6.2  --  5.4  NEUTROABS 5.1  --   --   HGB 11.7* 12.6* 12.2*  HCT 35.2* 37.0* 37.4*  MCV 104.8*  --  105.1*  PLT 62*  --  66*   Cardiac Enzymes:  Recent Labs Lab 03/12/13 0530  TROPONINI <0.30   CBG:  Recent Labs Lab 03/12/13 0706 03/12/13 0945  GLUCAP 137* 152*       Signed:  Gerrit Friends, PA-S  Algis Downs, PA-C [facility] 03/16/2013, 11:36 AM

## 2013-03-16 NOTE — Progress Notes (Signed)
Physical Therapy Treatment Patient Details Name: [name] MRN: [id number] DOB: March 16, 1961 Today's Date: 03/16/2013 Time: 0912-0928 PT Time Calculation (min): 16 min  PT Assessment / Plan / Recommendation  History of Present Illness Pt underwent ACDF, was home 3 days and fell. Apparently narcotic induced but pt with respiratory issues so readmitted.    PT Comments   Pt making good progress in physical therapy, and met all goals except stair negotiation. Pt declined physically negotiating steps today, however pt was reminded of safety concerns when going up/down steps especially with head/neck positioning. Pt reports he feels comfortable with the stairs and will have family members present to help him enter his home safely. Pt and wife are concerned about receiving a RW prior to d/c, and therapist spoke with wife on the phone regarding this. Pt continues to require reminders/assist to adjust brace to a snug fit, and frequent cueing for improved posture, and to avoid quick movements of the head/neck from side to side as well as down.  Follow Up Recommendations  Home health PT     Does the patient have the potential to tolerate intense rehabilitation     Barriers to Discharge        Equipment Recommendations  Rolling walker with 5" wheels    Recommendations for Other Services    Frequency Min 3X/week   Progress towards PT Goals Progress towards PT goals: Progressing toward goals  Plan Current plan remains appropriate    Precautions / Restrictions Precautions Precautions: Fall Required Braces or Orthoses: Cervical Brace Cervical Brace: Hard collar Restrictions Weight Bearing Restrictions: No   Pertinent Vitals/Pain Pt reports no pain throughout session, stating he had a pain pill prior to session beginning.    Mobility  Bed Mobility Bed Mobility: Supine to Sit;Sitting - Scoot to Edge of Bed Supine to Sit: 6: Modified independent (Device/Increase time) Sitting - Scoot to  Edge of Bed: 6: Modified independent (Device/Increase time) Transfers Transfers: Sit to Stand;Stand to Sit Sit to Stand: 6: Modified independent (Device/Increase time);From bed;With upper extremity assist Stand to Sit: 6: Modified independent (Device/Increase time);To chair/3-in-1;With armrests Details for Transfer Assistance: Pt demonstrated proper hand placement and safety awareness during transfers Ambulation/Gait Ambulation/Gait Assistance: 6: Modified independent (Device/Increase time) Ambulation Distance (Feet): 200 Feet Assistive device: Rolling walker Ambulation/Gait Assistance Details: Pt mainly cued for improved posture and head/neck positioning. Pt continues to turn head quickly from side to side, or quickly look down. Pt frequently reminded at beginning of ambulation to avoid those movements, and by end of ambulation cues were significantly decreased Gait velocity: WFL Stairs: No      PT Goals (current goals can now be found in the care plan section) Acute Rehab PT Goals Patient Stated Goal: return home PT Goal Formulation: With patient Time For Goal Achievement: 03/27/13 Potential to Achieve Goals: Good  Visit Information  Last PT Received On: 03/16/13 Assistance Needed: +1 History of Present Illness: Pt underwent ACDF, was home 3 days and fell. Apparently narcotic induced but pt with respiratory issues so readmitted.     Subjective Data  Subjective: "I'm being discharged today. Do you know if I'm getting a walker?" Patient Stated Goal: return home   Cognition  Cognition Arousal/Alertness: Awake/alert Behavior During Therapy: WFL for tasks assessed/performed Overall Cognitive Status: Within Functional Limits for tasks assessed General Comments: Pt overall more aware of safety and precautions with neck movements especially while ambulating    Balance     End of Session PT - End of  Session Equipment Utilized During Treatment: Gait belt;Cervical collar Activity  Tolerance: Patient tolerated treatment well Patient left: in chair;with call bell/phone within reach Nurse Communication: Mobility status   GP     Ruthann Cancer 03/16/2013, 9:52 AM  Ruthann Cancer, PT Acute Rehabilitation Services

## 2013-03-16 NOTE — Progress Notes (Signed)
Filed Vitals:   03/15/13 0508 03/15/13 1320 03/15/13 2138 03/16/13 0545  BP: 148/95 151/93 161/95 147/96  Pulse: 52 62 58 53  Temp: 98.4 F (36.9 C) 98.3 F (36.8 C) 98.7 F (37.1 C) 98.2 F (36.8 C)  TempSrc: Oral Oral Oral Oral  Resp: 18 20 16 18   Height:      Weight: 69.9 kg (154 lb 1.6 oz)   70.6 kg (155 lb 10.3 oz)  SpO2: 98% 94% 96% 96%    BMET  Recent Labs  03/14/13 0435  NA 137  K 4.6  CL 103  CO2 27  GLUCOSE 128*  BUN 17  CREATININE 0.63  CALCIUM 11.5*    Patient continued to do well. Has been up and ambulating. Wound healing nicely. In [place] cervical collar. Swelling in neck has steadily diminished.  Plan: Doing well from a neurosurgical perspective. Can be discharged to home from a neurosurgical respective, with followup with me in the office in 2-3 weeks.  Hewitt Shorts, MD 03/16/2013, 8:18 AM

## 2013-03-16 NOTE — Progress Notes (Signed)
[name] to be D/C'd Home per MD order.  Discussed with the patient and all questions fully answered.    Medication List         acetaminophen 325 MG tablet  Commonly known as:  TYLENOL  Take 2 tablets (650 mg total) by mouth every 4 (four) hours as needed.     amLODipine 5 MG tablet  Commonly known as:  NORVASC  Take 2 tablets (10 mg total) by mouth daily.     clobetasol ointment 0.05 %  Commonly known as:  TEMOVATE  Apply 1 application topically daily as needed. Apply affected areas for psoriasis     cloNIDine 0.3 MG tablet  Commonly known as:  CATAPRES  Take 0.3 mg by mouth 2 (two) times daily.     dexamethasone 1 MG tablet  Commonly known as:  DECADRON  - Take 2 tablets (2 mg total) by mouth 2 (two) times daily with a meal. Take 2 tabs twice daily with meals for two days  - Then take 1 tab twice daily with meals for two days  - Then take 1 tab daily with breakfast for two days and stop.     gabapentin 800 MG tablet  Commonly known as:  NEURONTIN  Take 800 mg by mouth 3 (three) times daily.     lithium carbonate 300 MG capsule  Take 300-600 mg by mouth 2 (two) times daily. Take 300 mg in the morning and 600mg  at night     oxyCODONE-acetaminophen 5-325 MG per tablet  Commonly known as:  PERCOCET/ROXICET  Take 1 tablet by mouth every 8 (eight) hours as needed for pain.     QUEtiapine 100 MG tablet  Commonly known as:  SEROQUEL  Take 200 mg by mouth at bedtime as needed (for sleep or aggitation).     sertraline 100 MG tablet  Commonly known as:  ZOLOFT  Take 100 mg by mouth daily.     traZODone 100 MG tablet  Commonly known as:  DESYREL  Take 200 mg by mouth at bedtime.       Vital signs stable. IV's removed, skin, dry and intact but flaky with scattered abrasions.  Abrasion to left elbow from previous fall. Cervical collar on, bilateral neck incisions, intact and clean. Percocet given an hour prior to discharge. Pt asked if he could wait till he got  home to take his daily medications, this was documented in the chart. Tele discontinued and called to central monitoring. No complaints at this time. Education completed in epic along with print offs given to patient.   An After Visit Summary was printed and given to the patient. Patient escorted via WC, and D/C home via private auto.  Driggers, Rae Roam 03/16/2013 9:14 AM

## 2013-03-27 NOTE — Discharge Summary (Signed)
Addendum  Patient seen and examined, chart and data base reviewed.  I agree with the above assessment and discharge plan.  For full details please see Mrs. Algis Downs PA note.   Clint Lipps, MD [facility] Pager: [phone] 03/27/2013, 12:23 PM

## 2013-06-10 ENCOUNTER — Other Ambulatory Visit: Payer: Self-pay

## 2014-04-11 ENCOUNTER — Emergency Department (HOSPITAL_COMMUNITY)
Admission: EM | Admit: 2014-04-11 | Discharge: 2014-04-11 | Disposition: A | Discharge status: Home / Self Care | Payer: Medicare HMO | Attending: Emergency Medicine | Admitting: Emergency Medicine

## 2014-04-11 ENCOUNTER — Encounter (HOSPITAL_COMMUNITY)
Admission: EM | Disposition: A | Discharge status: Home / Self Care | Payer: Self-pay | Source: Home / Self Care | Attending: Emergency Medicine

## 2014-04-11 ENCOUNTER — Encounter (HOSPITAL_COMMUNITY): Payer: Self-pay | Admitting: Emergency Medicine

## 2014-04-11 ENCOUNTER — Encounter (HOSPITAL_COMMUNITY)
Admission: EM | Disposition: A | Discharge status: Home / Self Care | Payer: Medicare PPO | Source: Home / Self Care | Attending: Emergency Medicine

## 2014-04-11 DIAGNOSIS — K219 Gastro-esophageal reflux disease without esophagitis: Secondary | ICD-10-CM | POA: Insufficient documentation

## 2014-04-11 DIAGNOSIS — Z79899 Other long term (current) drug therapy: Secondary | ICD-10-CM | POA: Diagnosis not present

## 2014-04-11 DIAGNOSIS — F319 Bipolar disorder, unspecified: Secondary | ICD-10-CM | POA: Diagnosis not present

## 2014-04-11 DIAGNOSIS — R011 Cardiac murmur, unspecified: Secondary | ICD-10-CM | POA: Diagnosis not present

## 2014-04-11 DIAGNOSIS — Z8669 Personal history of other diseases of the nervous system and sense organs: Secondary | ICD-10-CM | POA: Diagnosis not present

## 2014-04-11 DIAGNOSIS — T18108A Unspecified foreign body in esophagus causing other injury, initial encounter: Secondary | ICD-10-CM | POA: Diagnosis present

## 2014-04-11 DIAGNOSIS — F17200 Nicotine dependence, unspecified, uncomplicated: Secondary | ICD-10-CM | POA: Diagnosis not present

## 2014-04-11 DIAGNOSIS — T18128A Food in esophagus causing other injury, initial encounter: Secondary | ICD-10-CM

## 2014-04-11 DIAGNOSIS — Y9389 Activity, other specified: Secondary | ICD-10-CM | POA: Diagnosis not present

## 2014-04-11 DIAGNOSIS — Z8673 Personal history of transient ischemic attack (TIA), and cerebral infarction without residual deficits: Secondary | ICD-10-CM | POA: Diagnosis not present

## 2014-04-11 DIAGNOSIS — Z8619 Personal history of other infectious and parasitic diseases: Secondary | ICD-10-CM | POA: Diagnosis not present

## 2014-04-11 DIAGNOSIS — M129 Arthropathy, unspecified: Secondary | ICD-10-CM | POA: Diagnosis not present

## 2014-04-11 DIAGNOSIS — I10 Essential (primary) hypertension: Secondary | ICD-10-CM | POA: Diagnosis not present

## 2014-04-11 DIAGNOSIS — Z87448 Personal history of other diseases of urinary system: Secondary | ICD-10-CM | POA: Diagnosis not present

## 2014-04-11 DIAGNOSIS — Z8701 Personal history of pneumonia (recurrent): Secondary | ICD-10-CM | POA: Diagnosis not present

## 2014-04-11 DIAGNOSIS — IMO0002 Reserved for concepts with insufficient information to code with codable children: Secondary | ICD-10-CM | POA: Diagnosis not present

## 2014-04-11 DIAGNOSIS — R000 Tachycardia, unspecified: Secondary | ICD-10-CM | POA: Diagnosis not present

## 2014-04-11 DIAGNOSIS — Z872 Personal history of diseases of the skin and subcutaneous tissue: Secondary | ICD-10-CM | POA: Diagnosis not present

## 2014-04-11 DIAGNOSIS — Y9289 Other specified places as the place of occurrence of the external cause: Secondary | ICD-10-CM | POA: Diagnosis not present

## 2014-04-11 HISTORY — PX: ESOPHAGOGASTRODUODENOSCOPY: SHX5428

## 2014-04-11 SURGERY — EGD (ESOPHAGOGASTRODUODENOSCOPY)
Anesthesia: Moderate Sedation

## 2014-04-11 MED ORDER — GLUCAGON HCL RDNA (DIAGNOSTIC) 1 MG IJ SOLR
1.0000 mg | Freq: Once | INTRAMUSCULAR | Status: AC
  Administered 2014-04-11: 1 mg via INTRAVENOUS
  Filled 2014-04-11: qty 1

## 2014-04-11 MED ORDER — SODIUM CHLORIDE 0.9 % IV SOLN: INTRAVENOUS | Status: DC

## 2014-04-11 MED ORDER — FENTANYL CITRATE 0.05 MG/ML IJ SOLN
INTRAMUSCULAR | Status: DC | PRN
  Administered 2014-04-11 (×3): 25 ug via INTRAVENOUS

## 2014-04-11 MED ORDER — FENTANYL CITRATE 0.05 MG/ML IJ SOLN
INTRAMUSCULAR | Status: AC
  Filled 2014-04-11: qty 2

## 2014-04-11 MED ORDER — BUTAMBEN-TETRACAINE-BENZOCAINE 2-2-14 % EX AERO
INHALATION_SPRAY | CUTANEOUS | Status: DC | PRN
  Administered 2014-04-11: 2 via TOPICAL

## 2014-04-11 MED ORDER — MIDAZOLAM HCL 10 MG/2ML IJ SOLN
INTRAMUSCULAR | Status: DC | PRN
  Administered 2014-04-11 (×3): 2 mg via INTRAVENOUS

## 2014-04-11 MED ORDER — OMEPRAZOLE 20 MG PO CPDR: 20.0000 mg | DELAYED_RELEASE_CAPSULE | Freq: Two times a day (BID) | ORAL | Status: DC

## 2014-04-11 MED ORDER — MIDAZOLAM HCL 10 MG/2ML IJ SOLN
INTRAMUSCULAR | Status: AC
  Filled 2014-04-11: qty 2

## 2014-04-11 NOTE — ED Notes (Signed)
Bed: WU98 Expected date:  Expected time:  Means of arrival:  Comments: Hold Pt in Endo

## 2014-04-11 NOTE — Op Note (Signed)
[facility] [address] [place], [address]   ENDOSCOPY PROCEDURE REPORT  PATIENT: Damon, Melendez.  MR#: [id number] BIRTHDATE: 1961-03-16 , 53  yrs. old GENDER: Male ENDOSCOPIST: Wandalee Ferdinand, MD REFERRED BY: emergency room PROCEDURE DATE:  04/11/2014 PROCEDURE:   EGD with foreign body removal ASA CLASS: 2 INDICATIONS: meat impaction MEDICATIONS: fentanyl 75 mcg IV, Versed 6 mg IV TOPICAL ANESTHETIC: Cetacaine spray  The patient came to the emergency department after complaining of a meat impaction in his esophagus after eating chicken. The emergency department called for endoscopy after medical management did not help.  DESCRIPTION OF PROCEDURE:   After the risks benefits and alternatives of the procedure were thoroughly explained, informed consent was obtained.  The Pentax       endoscope was introduced through the mouth and advanced to the second portion of the duodenum      , limited by Without limitations.   The instrument was slowly withdrawn as the mucosa was fully examined.      FINDINGS:  Esophagus: When I passed the scope into the esophagus there was a meat impaction in the upper to mid esophagus. After insufflation of air and gentle passage of the scope the need to impaction was dislodged and passed distally into the stomach. There was a little irritation at where the impaction was in the upper and midesophagus but otherwise no evidence of stricture.  Stomach: There was retained food present in the stomach  Duodenum: Erythema in the bulb  COMPLICATIONS:none  ENDOSCOPIC IMPRESSION:meat impaction in esophagus removed with air insufflation and gentle passage with the scope   RECOMMENDATIONS:patient will be discharged home      _______________________________ eSignedWandalee Ferdinand, MD 04/11/2014 9:58 AM

## 2014-04-11 NOTE — ED Notes (Signed)
Pt returned from endoscopy, per RN pt was given 6 mg versed and 75 mcg fentanyl for procedure. Pt is still drowsy upon arrival to room, however easily arousable to speech, is alert and oriented. VSS

## 2014-04-11 NOTE — ED Notes (Signed)
Spoke with pt's sister who states pt's wife is on her way to ED.

## 2014-04-11 NOTE — ED Provider Notes (Signed)
CSN: [id number]     Arrival date & time 04/11/14  0458 History   First MD Initiated Contact with Patient 04/11/14 [phone]     Chief Complaint  Patient presents with  . Swallowed Foreign Body     (Consider location/radiation/quality/duration/timing/severity/associated sxs/prior Treatment) HPI Pt presents with c/o having a piece of chicken lodged in his esophagus.  Pt states he was eating chicken and watching TV, he felt a piece of chicken get stuck and he was able to cough it up.  However, then he began to eat chicken again and another piece of chicken got lodged.  This occurred at 1am and since that time he has not been able to drink liquids.  He states when he takes a sip of liquids it comes back up.  No choking or difficulty breathing.  No vomiting.  Pt feels pain in his throat where the food is lodged.  Denies having this happen to him in the past.  There are no other associated systemic symptoms, there are no other alleviating or modifying factors.   Past Medical History  Diagnosis Date  . Tachycardia   . Peripheral neuropathy   . Alcohol abuse     hx of quit 4/04  . Bilateral external ear infections 2007  . Tobacco abuse   . Bipolar affective disorder     Followed by Dr. [name]  . Avascular necrosis     SP right and left hip replacements and L shoulder arthroplasty, secondary to steroid use.  Has been on embrel in past.  . Psoriasis     Previously on embrel, stopped because of price.   . Hypertension   . Hepatitis C   . ARF (acute renal failure) 2008    Multifactorial in setting of over use of ibuprofen and HCTZ./ACEI  . Cirrhosis   . Back pain   . Pneumonia     hx  . Heart murmur   . Stroke 11    ? "mini stroke"  . GERD (gastroesophageal reflux disease)     occ  . Arthritis   . Pharyngeal edema 03/12/2013   Past Surgical History  Procedure Laterality Date  . Right total hip replacement  2004    Due to AVN  . Left total hip replacement  2001    Due to AVN  . Closed  manipulation, pinning, application of a traction pin right  2002  . Right shoulder arthroplasty      Due to AVN  . Appendectomy  1989  . Joint replacement Left 02    shoulder  . Tonsillectomy    . Hernia repair    . Anterior fusion cervical spine  03/10/2013  . Anterior cervical decomp/discectomy fusion N/A 03/10/2013    Procedure: Cervical Two-Three, Three to Four, Cervical Four to Five, Cervical Five to Six anterior cervical decompression with fusion plating and bonegraft;  Surgeon: Hewitt Shorts, MD;  Location: [facility];  Service: Neurosurgery;  Laterality: N/A;  ANTERIOR CERVICAL DECOMPRESSION/DISCECTOMY FUSION 4 LEVELS   Family History  Problem Relation Age of Onset  . Hypertension Mother   . Bipolar disorder Father   . Obesity Father   . Obesity Brother    History  Substance Use Topics  . Smoking status: Current Every Day Smoker -- 1.00 packs/day for 35 years    Types: Cigarettes  . Smokeless tobacco: Never Used     Comment: Not interested in stopping.      i want to quit togeather with my wife "  .  Alcohol Use: No     Comment: Previously alcoholic, denies ETOH for "past few weeks"    Review of Systems ROS reviewed and all otherwise negative except for mentioned in HPI    Allergies  Erythromycin; Hydromorphone hcl; and Prochlorperazine maleate  Home Medications   Prior to Admission medications   Medication Sig Start Date End Date Taking? Authorizing Provider  acetaminophen (TYLENOL) 325 MG tablet Take 2 tablets (650 mg total) by mouth every 4 (four) hours as needed. 03/16/13  Yes Marianne L [place], PA-C  amLODipine (NORVASC) 5 MG tablet Take 2 tablets (10 mg total) by mouth daily. 03/16/13  Yes Tora Kindred [place], PA-C  cloNIDine (CATAPRES) 0.3 MG tablet Take 0.3 mg by mouth 2 (two) times daily.   Yes Historical Provider, MD  lithium carbonate 300 MG capsule Take 300-600 mg by mouth 2 (two) times daily. Take 300 mg in the morning and  at night   Yes Historical  Provider, MD  sertraline (ZOLOFT) 100 MG tablet Take 100 mg by mouth daily.   Yes Historical Provider, MD  traZODone (DESYREL) 100 MG tablet Take 200 mg by mouth at bedtime.   Yes Historical Provider, MD  clobetasol ointment (TEMOVATE) 0.05 % Apply 1 application topically daily as needed. Apply affected areas for psoriasis    Historical Provider, MD  omeprazole (PRILOSEC) 20 MG capsule Take 1 capsule (20 mg total) by mouth 2 (two) times daily. 04/11/14   Rolland Porter, MD   BP 113/67  Pulse 61  Temp(Src) 98.4 F (36.9 C) (Oral)  Resp 16  SpO2 100% Vitals reviewed Physical Exam Physical Examination: General appearance - alert, well appearing, and in no distress, spitting into a bag Mental status - alert, oriented to person, place, and time Eyes - no conjunctival injection, no scleral icterus Mouth - mucous membranes moist, pharynx normal without lesions Chest - clear to auscultation, no wheezes, rales or rhonchi, symmetric air entry, no stridor or difficulty breathing Heart - normal rate, regular rhythm, normal S1, S2, no murmurs, rubs, clicks or gallops Abdomen - soft, nontender, nondistended, no masses or organomegaly Extremities - peripheral pulses normal, no pedal edema, no clubbing or cyanosis Skin - normal coloration and turgor, no rashes  ED Course  Procedures (including critical care time)  6:19 AM pt has had glucagon. On recheck he is looking more comfortable and states his throat is feeling some improvement.  Will have him try to sip some water.   7:01 AM after glucagon, pt initially felt improved, then had some gagging and continues to feel that the food is lodged in his esophagus.  I have called GI, Dr. Evette Cristal he will see patient for endoscopy.  I have discussed endoscopy with the patient and he is agreeable with this plan.  Labs Review Labs Reviewed - No data to display  Imaging Review No results found.   EKG Interpretation None      MDM   Final diagnoses:  Food  impaction of esophagus, initial encounter    Pt presenting with report of food impaction/chicken- after glucagon he felt some mild improvement but upon trying to sip water he began regurgitating the water.  Continues to feel food impaction.  D/w GI as noted above and patient to have endoscopy.     Ethelda Chick, MD 04/11/14 [phone]

## 2014-04-11 NOTE — Discharge Instructions (Signed)
Call Dr. Evette Cristal for a follow up appointment.  Esophagogastroduodenoscopy Care After Refer to this sheet in the next few weeks. These instructions provide you with information on caring for yourself after your procedure. Your caregiver may also give you more specific instructions. Your treatment has been planned according to current medical practices, but problems sometimes occur. Call your caregiver if you have any problems or questions after your procedure.  HOME CARE INSTRUCTIONS  Do not eat or drink anything until the numbing medicine (local anesthetic) has worn off and your gag reflex has returned. You will know that the local anesthetic has worn off when you can swallow comfortably.  Do not drive for 12 hours after the procedure or as directed by your caregiver.  Only take medicines as directed by your caregiver. SEEK MEDICAL CARE IF:   You cannot stop coughing.  You are not urinating at all or less than usual. SEEK IMMEDIATE MEDICAL CARE IF:  You have difficulty swallowing.  You cannot eat or drink.  You have worsening throat or chest pain.  You have dizziness, lightheadedness, or you faint.  You have nausea or vomiting.  You have chills.  You have a fever.  You have severe abdominal pain.  You have black, tarry, or bloody stools. Document Released: 07/08/2012 Document Reviewed: 07/08/2012 [facility] Patient Information 2015 [place], [place]. This information is not intended to replace advice given to you by your health care provider. Make sure you discuss any questions you have with your health care provider.  Swallowed Foreign Body, Adult You have swallowed an object (foreign body). Once the foreign body has passed through the food tube (esophagus), which leads from the mouth to the stomach, it will usually continue through the body without problems. This is because the point where the esophagus enters into the stomach is the narrowest place through which the foreign body  must pass. Sometimes the foreign body gets stuck. The most common type of foreign body obstruction in adults is food impaction. Many times, bones from fish or meat products may become lodged in the esophagus or injure the throat on the way down. When there is an object that obstructs the esophagus, the most obvious symptoms are pain and the inability to swallow normally. In some cases, foreign bodies that can be life threatening are swallowed. Examples of these are certain medications and illicit drugs. Often in these instances, patients are afraid of telling what they swallowed. However, it is extremely important to tell the emergency caregiver what was swallowed because life-saving treatment may be needed.  X-ray exams may be taken to find the location of the foreign body. However, some objects do not show up well or may be too small to be seen on an X-ray image. If the foreign body is too large or too sharp, it may be too dangerous to allow it to pass on its own. You may need to see a caregiver who specializes in the digestive system (gastroenterologist). In a few cases, a specialist may need to remove the object using a method called "endoscopy". This involves passing a thin, soft, flexible tube into the food pipe to locate and remove the object. Follow up with your primary doctor or the referral you were given by the emergency caregiver. HOME CARE INSTRUCTIONS   If your caregiver says it is safe for you to eat, then only have liquids and soft foods until your symptoms improve.  Once you are eating normally:  Cut food into small pieces.  Remove  small bones from food.  Remove large seeds and pits from fruit.  Chew your food well.  Do not talk, laugh, or engage in physical activity while eating or swallowing. SEEK MEDICAL CARE IF:  You develop worsening shortness of breath, uncontrollable coughing, chest pains or high fever, greater than 102 F (38.9 C).  You are unable to eat or drink or  you feel that food is getting stuck in your throat.  You have choking symptoms or cannot stop drooling.  You develop abdominal pain, vomiting (especially of blood), or rectal bleeding. MAKE SURE YOU:   Understand these instructions.  Will watch your condition.  Will get help right away if you are not doing well or get worse. Document Released: 01/09/2010 Document Revised: 10/14/2011 Document Reviewed: 01/09/2010 [facility] Patient Information 2015 [place], [place]. This information is not intended to replace advice given to you by your health care provider. Make sure you discuss any questions you have with your health care provider.

## 2014-04-11 NOTE — H&P (Signed)
The patient is a 53 year old male who presented to the emergency room with complaints of dysphagia. He was eating chicken last night and a piece of chicken got stuck in the esophagus. When he came to the emergency department he was given Glucagon but it did not help. I was called for endoscopy.  Patient has history of hypertension    Physical  He does not appear in any acute distress  Heart regular rhythm grade 2/6 systolic murmur  Lungs clear  Abdomen: Soft nontender  Impression: Meat impaction in esophagus  Plan: EGD with foreign body removal

## 2014-04-11 NOTE — ED Provider Notes (Signed)
Pt returns to ER s/p endoscopy and FB removal.  Awake, drinking without difficulty.  DC.  F/U c Dr. Debbe Odea, MD 04/11/14 [phone]

## 2014-04-11 NOTE — ED Notes (Signed)
Per EMS pt states he was eating to fast possibly swallowed chicken  ago. Pt is unable to drink or swallow. Pt denies vomiting. Pt is able to speak and is ambulatory. Pt is alert and oriented.

## 2014-04-12 ENCOUNTER — Encounter (HOSPITAL_COMMUNITY): Payer: Self-pay | Admitting: Gastroenterology

## 2014-08-11 ENCOUNTER — Other Ambulatory Visit: Payer: Self-pay | Admitting: Family Medicine

## 2014-08-11 DIAGNOSIS — B1920 Unspecified viral hepatitis C without hepatic coma: Secondary | ICD-10-CM

## 2014-08-15 ENCOUNTER — Ambulatory Visit
Admission: RE | Admit: 2014-08-15 | Discharge: 2014-08-15 | Disposition: A | Discharge status: Home / Self Care | Payer: Medicare Other | Source: Ambulatory Visit | Attending: Family Medicine | Admitting: Family Medicine

## 2014-08-15 ENCOUNTER — Other Ambulatory Visit: Payer: Self-pay | Admitting: Family Medicine

## 2014-08-15 DIAGNOSIS — B1920 Unspecified viral hepatitis C without hepatic coma: Secondary | ICD-10-CM

## 2014-12-05 ENCOUNTER — Ambulatory Visit (HOSPITAL_COMMUNITY)
Admission: AD | Admit: 2014-12-05 | Discharge: 2014-12-05 | Disposition: A | Discharge status: Home / Self Care | Payer: 59 | Source: Intra-hospital | Attending: Psychiatry | Admitting: Psychiatry

## 2015-03-29 ENCOUNTER — Emergency Department (HOSPITAL_COMMUNITY)
Admission: EM | Admit: 2015-03-29 | Discharge: 2015-03-29 | Disposition: A | Discharge status: Home / Self Care | Payer: Medicare Other | Attending: Emergency Medicine | Admitting: Emergency Medicine

## 2015-03-29 ENCOUNTER — Emergency Department (HOSPITAL_COMMUNITY): Payer: Medicare Other

## 2015-03-29 ENCOUNTER — Encounter (HOSPITAL_COMMUNITY): Payer: Self-pay

## 2015-03-29 DIAGNOSIS — Z720 Tobacco use: Secondary | ICD-10-CM | POA: Insufficient documentation

## 2015-03-29 DIAGNOSIS — Z872 Personal history of diseases of the skin and subcutaneous tissue: Secondary | ICD-10-CM | POA: Diagnosis not present

## 2015-03-29 DIAGNOSIS — M1990 Unspecified osteoarthritis, unspecified site: Secondary | ICD-10-CM | POA: Insufficient documentation

## 2015-03-29 DIAGNOSIS — K219 Gastro-esophageal reflux disease without esophagitis: Secondary | ICD-10-CM | POA: Insufficient documentation

## 2015-03-29 DIAGNOSIS — Z79899 Other long term (current) drug therapy: Secondary | ICD-10-CM | POA: Diagnosis not present

## 2015-03-29 DIAGNOSIS — Z8619 Personal history of other infectious and parasitic diseases: Secondary | ICD-10-CM | POA: Insufficient documentation

## 2015-03-29 DIAGNOSIS — I10 Essential (primary) hypertension: Secondary | ICD-10-CM | POA: Insufficient documentation

## 2015-03-29 DIAGNOSIS — Z8669 Personal history of other diseases of the nervous system and sense organs: Secondary | ICD-10-CM | POA: Insufficient documentation

## 2015-03-29 DIAGNOSIS — M545 Low back pain, unspecified: Secondary | ICD-10-CM

## 2015-03-29 DIAGNOSIS — Z8673 Personal history of transient ischemic attack (TIA), and cerebral infarction without residual deficits: Secondary | ICD-10-CM | POA: Diagnosis not present

## 2015-03-29 DIAGNOSIS — N179 Acute kidney failure, unspecified: Secondary | ICD-10-CM | POA: Diagnosis not present

## 2015-03-29 DIAGNOSIS — R011 Cardiac murmur, unspecified: Secondary | ICD-10-CM | POA: Insufficient documentation

## 2015-03-29 DIAGNOSIS — F319 Bipolar disorder, unspecified: Secondary | ICD-10-CM | POA: Diagnosis not present

## 2015-03-29 DIAGNOSIS — M79604 Pain in right leg: Secondary | ICD-10-CM | POA: Diagnosis present

## 2015-03-29 MED ORDER — PREDNISONE 20 MG PO TABS
ORAL_TABLET | ORAL | Status: AC
  Administered 2015-03-29: 17:00:00
  Filled 2015-03-29: qty 3

## 2015-03-29 MED ORDER — MORPHINE SULFATE (PF) 2 MG/ML IV SOLN
INTRAVENOUS | Status: AC
  Administered 2015-03-29: 2 mg via INTRAMUSCULAR
  Filled 2015-03-29: qty 1

## 2015-03-29 MED ORDER — MORPHINE SULFATE (PF) 4 MG/ML IV SOLN
INTRAVENOUS | Status: AC
  Administered 2015-03-29: 4 mg via INTRAMUSCULAR
  Filled 2015-03-29: qty 1

## 2015-03-29 MED ORDER — KETOROLAC TROMETHAMINE 15 MG/ML IJ SOLN
INTRAMUSCULAR | Status: AC
  Administered 2015-03-29: 17:00:00
  Filled 2015-03-29: qty 1

## 2015-03-29 MED ORDER — DIAZEPAM 5 MG/ML IJ SOLN
INTRAMUSCULAR | Status: AC
  Administered 2015-03-29: 17:00:00
  Filled 2015-03-29: qty 2

## 2015-03-29 NOTE — ED Notes (Signed)
Patient transported to MRI 

## 2015-03-29 NOTE — ED Notes (Signed)
Pt comes in today with EMS with a c/o bilat leg pain. Pt states it feel like sciatic pain. Pt also c/o his legs burning as well.

## 2015-03-29 NOTE — ED Notes (Signed)
He was able to stand from EMS stretcher to pivot and get on E. D. Stretcher without incident.

## 2015-03-29 NOTE — ED Notes (Signed)
He is soundly sleeping; and is breathing normally.  I will assess him at a more opportune time.

## 2015-03-29 NOTE — ED Notes (Signed)
Bed: IO96 Expected date:  Expected time:  Means of arrival:  Comments: EMS- 53yo M, back pain x 1 month, Hx of sciatica

## 2015-03-29 NOTE — ED Notes (Signed)
He c/o bilat. Leg pain (chronic) with recent bilat. Leg weakness and "they give out and I fall a lot".  He states the pain and weakness of his right leg is somewhat more vs. Left.  He is alert and oriented x 4 with clear speech.  He has widely disseminated psoriasis lesions, including of scalp.  Pectus excavatum observed.

## 2015-03-29 NOTE — ED Provider Notes (Addendum)
Imaging as below. Symptomatic tx. Steroids. PRN muscle relaxant, pain meds. 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 Return precautions discussed. Discharged during "downtime." Provided with written scripts for PRN valium, percocet and 5 days of prednisone. Provided with copy of MRI report.    Dg Lumbar Spine Complete  03/29/2015   CLINICAL DATA:  Bilateral leg pain.  Recent bilateral leg weakness.  EXAM: LUMBAR SPINE - COMPLETE 4+ VIEW  COMPARISON:  None.  FINDINGS: Posterior fusion changes at L4-5. Solid bony fusion across the disc space. No hardware complicating feature. No malalignment or fracture.  IMPRESSION: Postoperative changes in the lower lumbar spine. No acute bony abnormality.   Electronically Signed   By: Charlett Nose M.D.   On: 03/29/2015 10:09   Mr Lumbar Spine Wo Contrast  03/29/2015   CLINICAL DATA:  Low back pain and bilateral leg pain and burning, month duration. Previous fusion surgery.  EXAM: MRI LUMBAR SPINE WITHOUT CONTRAST  TECHNIQUE: Multiplanar, multisequence MR imaging of the lumbar spine was performed. No intravenous contrast was administered.  COMPARISON:  Radiography same day.  FINDINGS: T11-12: Normal.  T12-L1: Inferior endplate Schmorl's node at T12 with regional edema. This could be associated with back pain. Minimal bulging of the disc. No apparent neural compression.  L1-2:  Mild desiccation of the disc.  No stenosis.  L2-3:  Normal interspace.  L3-4: Disc degeneration with circumferential protrusion of disc material. Facet degeneration with facet and ligamentous hypertrophy. Severe spinal stenosis at this level that could be symptomatic.  L4-5: Distant decompression and fusion. Wide patency of the canal and foramina.  L5-S1:  No disc pathology.  Mild facet degeneration.  No stenosis.  IMPRESSION: Previous decompression and fusion at L4-5.  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  Inferior endplate Schmorl's node at T12  with associated marrow edema. This could be a cause of back pain.   Electronically Signed   By: Paulina Fusi M.D.   On: 03/29/2015 15:15     Raeford Razor, MD 03/29/15 [phone]

## 2015-04-03 ENCOUNTER — Encounter (HOSPITAL_COMMUNITY): Payer: Self-pay

## 2015-04-03 ENCOUNTER — Emergency Department (HOSPITAL_COMMUNITY): Payer: Medicare Other

## 2015-04-03 ENCOUNTER — Inpatient Hospital Stay (HOSPITAL_COMMUNITY)
Admission: EM | Admit: 2015-04-03 | Discharge: 2015-04-11 | DRG: 460 | Disposition: A | Discharge status: Skilled Nursing Facility | Payer: Medicare Other | Attending: Internal Medicine | Admitting: Internal Medicine

## 2015-04-03 DIAGNOSIS — Z96643 Presence of artificial hip joint, bilateral: Secondary | ICD-10-CM | POA: Diagnosis present

## 2015-04-03 DIAGNOSIS — F3132 Bipolar disorder, current episode depressed, moderate: Secondary | ICD-10-CM | POA: Diagnosis present

## 2015-04-03 DIAGNOSIS — K219 Gastro-esophageal reflux disease without esophagitis: Secondary | ICD-10-CM | POA: Diagnosis present

## 2015-04-03 DIAGNOSIS — D61818 Other pancytopenia: Secondary | ICD-10-CM | POA: Diagnosis present

## 2015-04-03 DIAGNOSIS — M5116 Intervertebral disc disorders with radiculopathy, lumbar region: Secondary | ICD-10-CM | POA: Diagnosis present

## 2015-04-03 DIAGNOSIS — F17210 Nicotine dependence, cigarettes, uncomplicated: Secondary | ICD-10-CM | POA: Diagnosis present

## 2015-04-03 DIAGNOSIS — D7589 Other specified diseases of blood and blood-forming organs: Secondary | ICD-10-CM | POA: Diagnosis present

## 2015-04-03 DIAGNOSIS — M5144 Schmorl's nodes, thoracic region: Secondary | ICD-10-CM | POA: Diagnosis present

## 2015-04-03 DIAGNOSIS — G629 Polyneuropathy, unspecified: Secondary | ICD-10-CM | POA: Diagnosis present

## 2015-04-03 DIAGNOSIS — M545 Low back pain, unspecified: Secondary | ICD-10-CM | POA: Diagnosis present

## 2015-04-03 DIAGNOSIS — Z885 Allergy status to narcotic agent status: Secondary | ICD-10-CM

## 2015-04-03 DIAGNOSIS — F319 Bipolar disorder, unspecified: Secondary | ICD-10-CM | POA: Diagnosis present

## 2015-04-03 DIAGNOSIS — Z981 Arthrodesis status: Secondary | ICD-10-CM

## 2015-04-03 DIAGNOSIS — Z7951 Long term (current) use of inhaled steroids: Secondary | ICD-10-CM

## 2015-04-03 DIAGNOSIS — M4326 Fusion of spine, lumbar region: Secondary | ICD-10-CM

## 2015-04-03 DIAGNOSIS — Z888 Allergy status to other drugs, medicaments and biological substances status: Secondary | ICD-10-CM

## 2015-04-03 DIAGNOSIS — M549 Dorsalgia, unspecified: Secondary | ICD-10-CM | POA: Diagnosis present

## 2015-04-03 DIAGNOSIS — M4806 Spinal stenosis, lumbar region: Principal | ICD-10-CM | POA: Diagnosis present

## 2015-04-03 DIAGNOSIS — Z720 Tobacco use: Secondary | ICD-10-CM | POA: Diagnosis present

## 2015-04-03 DIAGNOSIS — F1020 Alcohol dependence, uncomplicated: Secondary | ICD-10-CM | POA: Diagnosis present

## 2015-04-03 DIAGNOSIS — Z881 Allergy status to other antibiotic agents status: Secondary | ICD-10-CM

## 2015-04-03 DIAGNOSIS — M48061 Spinal stenosis, lumbar region without neurogenic claudication: Secondary | ICD-10-CM

## 2015-04-03 DIAGNOSIS — G4733 Obstructive sleep apnea (adult) (pediatric): Secondary | ICD-10-CM | POA: Diagnosis present

## 2015-04-03 DIAGNOSIS — B1920 Unspecified viral hepatitis C without hepatic coma: Secondary | ICD-10-CM | POA: Diagnosis present

## 2015-04-03 DIAGNOSIS — M5489 Other dorsalgia: Secondary | ICD-10-CM | POA: Diagnosis not present

## 2015-04-03 DIAGNOSIS — Z7982 Long term (current) use of aspirin: Secondary | ICD-10-CM

## 2015-04-03 DIAGNOSIS — R509 Fever, unspecified: Secondary | ICD-10-CM | POA: Diagnosis not present

## 2015-04-03 DIAGNOSIS — R1110 Vomiting, unspecified: Secondary | ICD-10-CM | POA: Diagnosis not present

## 2015-04-03 DIAGNOSIS — M48062 Spinal stenosis, lumbar region with neurogenic claudication: Secondary | ICD-10-CM | POA: Diagnosis present

## 2015-04-03 DIAGNOSIS — Z8249 Family history of ischemic heart disease and other diseases of the circulatory system: Secondary | ICD-10-CM

## 2015-04-03 DIAGNOSIS — K7460 Unspecified cirrhosis of liver: Secondary | ICD-10-CM | POA: Diagnosis present

## 2015-04-03 DIAGNOSIS — R410 Disorientation, unspecified: Secondary | ICD-10-CM | POA: Diagnosis not present

## 2015-04-03 DIAGNOSIS — Z818 Family history of other mental and behavioral disorders: Secondary | ICD-10-CM

## 2015-04-03 DIAGNOSIS — Z79899 Other long term (current) drug therapy: Secondary | ICD-10-CM

## 2015-04-03 DIAGNOSIS — L409 Psoriasis, unspecified: Secondary | ICD-10-CM | POA: Diagnosis present

## 2015-04-03 DIAGNOSIS — I10 Essential (primary) hypertension: Secondary | ICD-10-CM | POA: Diagnosis present

## 2015-04-03 LAB — ETHANOL

## 2015-04-03 MED ORDER — MORPHINE SULFATE (PF) 4 MG/ML IV SOLN
6.0000 mg | Freq: Once | INTRAVENOUS | Status: AC
  Administered 2015-04-04: 6 mg via INTRAVENOUS
  Filled 2015-04-03: qty 2

## 2015-04-03 MED ORDER — SODIUM CHLORIDE 0.9 % IV BOLUS (SEPSIS)
1500.0000 mL | Freq: Once | INTRAVENOUS | Status: AC
  Administered 2015-04-04: 1500 mL via INTRAVENOUS

## 2015-04-03 MED ORDER — KETOROLAC TROMETHAMINE 60 MG/2ML IM SOLN
60.0000 mg | Freq: Once | INTRAMUSCULAR | Status: AC
  Administered 2015-04-03: 60 mg via INTRAMUSCULAR
  Filled 2015-04-03: qty 2

## 2015-04-03 MED ORDER — DEXAMETHASONE SODIUM PHOSPHATE 10 MG/ML IJ SOLN
10.0000 mg | Freq: Once | INTRAMUSCULAR | Status: AC
  Administered 2015-04-04: 10 mg via INTRAVENOUS
  Filled 2015-04-03: qty 1

## 2015-04-03 NOTE — ED Provider Notes (Signed)
CSN: [id number]     Arrival date & time 04/03/15  2028 History   First MD Initiated Contact with Patient 04/03/15 2108     Chief Complaint  Patient presents with  . Fall  . Leg Pain    HPI Patient presents emergency department complaining of ongoing severe low back pain.  He was recently seen in the emergency department and diagnosed with spinal stenosis.  He denies new weakness in his legs but reports difficulty ambulating secondary to the pain.  I spoke with the patient's wife who reports that over the past several days he just been lying in bed and becoming weaker.  Said decreased oral intake.  He states he had to crawl to the restroom earlier today because of such severe low back pain.  He is more difficulty ambulating secondary to the pain and therefore he tried to obtain a walker to assist him.  Both he and his wife state that he is struggling at home.  No fevers or chills.  No history of cancer.   Past Medical History  Diagnosis Date  . Tachycardia   . Peripheral neuropathy   . Alcohol abuse     hx of quit 4/04  . Bilateral external ear infections 2007  . Tobacco abuse   . Bipolar affective disorder     Followed by Dr. [name]  . Avascular necrosis     SP right and left hip replacements and L shoulder arthroplasty, secondary to steroid use.  Has been on embrel in past.  . Psoriasis     Previously on embrel, stopped because of price.   . Hypertension   . Hepatitis C   . ARF (acute renal failure) 2008    Multifactorial in setting of over use of ibuprofen and HCTZ./ACEI  . Cirrhosis   . Back pain   . Pneumonia     hx  . Heart murmur   . Stroke 11    ? "mini stroke"  . GERD (gastroesophageal reflux disease)     occ  . Arthritis   . Pharyngeal edema 03/12/2013   Past Surgical History  Procedure Laterality Date  . Right total hip replacement  2004    Due to AVN  . Left total hip replacement  2001    Due to AVN  . Closed manipulation, pinning, application of a  traction pin right  2002  . Right shoulder arthroplasty      Due to AVN  . Appendectomy  1989  . Joint replacement Left 02    shoulder  . Tonsillectomy    . Hernia repair    . Anterior fusion cervical spine  03/10/2013  . Anterior cervical decomp/discectomy fusion N/A 03/10/2013    Procedure: Cervical Two-Three, Three to Four, Cervical Four to Five, Cervical Five to Six anterior cervical decompression with fusion plating and bonegraft;  Surgeon: Hewitt Shorts, MD;  Location: [facility];  Service: Neurosurgery;  Laterality: N/A;  ANTERIOR CERVICAL DECOMPRESSION/DISCECTOMY FUSION 4 LEVELS  . Esophagogastroduodenoscopy N/A 04/11/2014    Procedure: ESOPHAGOGASTRODUODENOSCOPY (EGD);  Surgeon: Graylin Shiver, MD;  Location: [facility];  Service: Endoscopy;  Laterality: N/A;  . Esophagogastroduodenoscopy N/A 04/11/2014    Procedure: ESOPHAGOGASTRODUODENOSCOPY (EGD);  Surgeon: Graylin Shiver, MD;  Location: [facility];  Service: Endoscopy;  Laterality: N/A;   Family History  Problem Relation Age of Onset  . Hypertension Mother   . Bipolar disorder Father   . Obesity Father   . Obesity Brother    Social  History  Substance Use Topics  . Smoking status: Current Every Day Smoker -- 1.00 packs/day for 35 years    Types: Cigarettes  . Smokeless tobacco: Never Used     Comment: Not interested in stopping.      i want to quit togeather with my wife "  . Alcohol Use: No     Comment: Previously alcoholic, denies ETOH for "past few weeks"    Review of Systems  All other systems reviewed and are negative.     Allergies  Erythromycin; Hydromorphone hcl; and Prochlorperazine maleate  Home Medications   Prior to Admission medications   Medication Sig Start Date End Date Taking? Authorizing Provider  aspirin 81 MG tablet Take 81 mg by mouth daily.   Yes Historical Provider, MD  augmented betamethasone dipropionate (DIPROLENE-AF) 0.05 % ointment Apply 1 application topically 2 (two) times  daily. 03/06/15  Yes Historical Provider, MD  clobetasol (TEMOVATE) 0.05 % external solution Apply 1 application topically every 7 (seven) days. 12/13/14  Yes Historical Provider, MD  clobetasol ointment (TEMOVATE) 0.05 % Apply 1 application topically daily as needed. Apply affected areas for psoriasis   Yes Historical Provider, MD  cloNIDine (CATAPRES) 0.3 MG tablet Take 0.3 mg by mouth 2 (two) times daily.   Yes Historical Provider, MD  fluticasone (CUTIVATE) 0.005 % ointment Apply 1 application topically 2 (two) times daily. 03/06/15  Yes Historical Provider, MD  lithium carbonate 300 MG capsule Take 900 mg by mouth at bedtime.    Yes Historical Provider, MD  oxyCODONE-acetaminophen (PERCOCET/ROXICET) 5-325 MG per tablet Take 1 tablet by mouth every 6 (six) hours as needed for moderate pain.  03/18/15  Yes Historical Provider, MD  traZODone (DESYREL) 100 MG tablet Take 200 mg by mouth at bedtime.   Yes Historical Provider, MD  acetaminophen (TYLENOL) 325 MG tablet Take 2 tablets (650 mg total) by mouth every 4 (four) hours as needed. Patient not taking: Reported on 03/29/2015 03/16/13   Stephani Police, PA-C  amLODipine (NORVASC) 5 MG tablet Take 2 tablets (10 mg total) by mouth daily. Patient not taking: Reported on 03/29/2015 03/16/13   Stephani Police, PA-C  omeprazole (PRILOSEC) 20 MG capsule Take 1 capsule (20 mg total) by mouth 2 (two) times daily. Patient not taking: Reported on 03/29/2015 04/11/14   Rolland Porter, MD   BP 108/75 mmHg  Pulse 90  Temp(Src) 98.3 F (36.8 C) (Oral)  Resp 26  SpO2 96% Physical Exam  Constitutional: He is oriented to person, place, and time. He appears well-developed and well-nourished.  HENT:  Head: Normocephalic and atraumatic.  Eyes: EOM are normal.  Neck: Normal range of motion.  Cardiovascular: Normal rate, regular rhythm, normal heart sounds and intact distal pulses.   Pulmonary/Chest: Effort normal and breath sounds normal. No respiratory distress.   Abdominal: Soft. He exhibits no distension. There is no tenderness.  Musculoskeletal: Normal range of motion.  5 out of 5 strength in lower extremity major muscle groups and isolation.  Unable ambulate secondary to severe pain.   Neurological: He is alert and oriented to person, place, and time.  Skin: Skin is warm and dry.  Psychiatric: He has a normal mood and affect. Judgment normal.  Nursing note and vitals reviewed.   ED Course  Procedures (including critical care time) Labs Review Labs Reviewed  CBC WITH DIFFERENTIAL/PLATELET - Abnormal; Notable for the following:    RBC 3.91 (*)    MCV 105.1 (*)    Platelets 106 (*)  All other components within normal limits  COMPREHENSIVE METABOLIC PANEL - Abnormal; Notable for the following:    Chloride 100 (*)    Calcium 11.9 (*)    AST 112 (*)    ALT 121 (*)    All other components within normal limits  AMMONIA - Abnormal; Notable for the following:    Ammonia 49 (*)    All other components within normal limits  CK - Abnormal; Notable for the following:    Total CK 29 (*)    All other components within normal limits  ETHANOL  LITHIUM LEVEL  URINE RAPID DRUG SCREEN, HOSP PERFORMED    Imaging Review Dg Chest 2 View  04/04/2015   CLINICAL DATA:  Status post fall from chair, with weakness and shortness of breath. Initial encounter.  EXAM: CHEST  2 VIEW  COMPARISON:  Chest radiograph performed 03/12/2013  FINDINGS: The lungs are well-aerated and clear. There is no evidence of focal opacification, pleural effusion or pneumothorax.  The heart is borderline normal in size. No acute osseous abnormalities are seen. Bilateral shoulder arthroplasties are noted. Cervical spinal fusion hardware is partially imaged.  IMPRESSION: No acute cardiopulmonary process seen. No displaced rib fractures identified.   Electronically Signed   By: Roanna Raider M.D.   On: 04/04/2015 00:08   I have personally reviewed and evaluated these images and lab  results as part of my medical decision-making.   EKG Interpretation None      MDM   Final diagnoses:  None    PET images and the patient twice and he is still struggling to ambulate secondary to pain.  I don't think he needs emergent decompression at this time as he still has strength of his lower extremities and isolation.  I honestly believe the patient has become extremely debilitated over the past week secondary to lying in bed and not moving much.  I think the patient benefit from pain control and symptomatic management while in the hospital as well as evaluation and treatment by physical therapy to strengthen the patient's legs.  He will receive a dose of drawn at this time for anti-inflammatory properties as well in attempt to better control his pain so that he can involve himself with physical therapy.  I don't think the patient can be discharged home safely at this time.    Azalia Bilis, MD 04/04/15 [phone]

## 2015-04-03 NOTE — ED Notes (Signed)
Pt fell from his chair this evening, he was dx with spinal stenosis two months ago and his wife says that he's not done well since then, he only complains of left thigh pain

## 2015-04-03 NOTE — ED Notes (Signed)
Spoke with Elodia Florence, patients spouse. Her contact number is [phone]. Pt's spouse reports he can not be discharged because he can not function at home.

## 2015-04-03 NOTE — ED Notes (Signed)
Bed: WA21 Expected date:  Expected time:  Means of arrival:  Comments: Back pain, leg pain

## 2015-04-04 ENCOUNTER — Other Ambulatory Visit: Payer: Self-pay | Admitting: Neurosurgery

## 2015-04-04 ENCOUNTER — Observation Stay (HOSPITAL_COMMUNITY): Payer: Medicare Other

## 2015-04-04 DIAGNOSIS — F3132 Bipolar disorder, current episode depressed, moderate: Secondary | ICD-10-CM | POA: Diagnosis not present

## 2015-04-04 DIAGNOSIS — M545 Low back pain, unspecified: Secondary | ICD-10-CM | POA: Diagnosis present

## 2015-04-04 DIAGNOSIS — F1020 Alcohol dependence, uncomplicated: Secondary | ICD-10-CM | POA: Diagnosis not present

## 2015-04-04 DIAGNOSIS — M549 Dorsalgia, unspecified: Secondary | ICD-10-CM | POA: Diagnosis present

## 2015-04-04 DIAGNOSIS — G4733 Obstructive sleep apnea (adult) (pediatric): Secondary | ICD-10-CM

## 2015-04-04 DIAGNOSIS — D61818 Other pancytopenia: Secondary | ICD-10-CM | POA: Diagnosis not present

## 2015-04-04 DIAGNOSIS — M5489 Other dorsalgia: Secondary | ICD-10-CM | POA: Diagnosis not present

## 2015-04-04 DIAGNOSIS — Z720 Tobacco use: Secondary | ICD-10-CM

## 2015-04-04 LAB — COMPREHENSIVE METABOLIC PANEL
ALBUMIN: 3.6 g/dL (ref 3.5–5.0)
ALK PHOS: 106 U/L (ref 38–126)
ALT: 121 U/L — ABNORMAL HIGH (ref 17–63)
ANION GAP: 9 (ref 5–15)
AST: 112 U/L — AB (ref 15–41)
BILIRUBIN TOTAL: 1 mg/dL (ref 0.3–1.2)
BUN: 12 mg/dL (ref 6–20)
CALCIUM: 11.9 mg/dL — AB (ref 8.9–10.3)
CO2: 28 mmol/L (ref 22–32)
Chloride: 100 mmol/L — ABNORMAL LOW (ref 101–111)
Creatinine, Ser: 1.01 mg/dL (ref 0.61–1.24)
GFR calc Af Amer: >60 mL/min (ref >60)
GFR calc non Af Amer: >60 mL/min (ref >60)
GLUCOSE: 99 mg/dL (ref 65–99)
POTASSIUM: 3.8 mmol/L (ref 3.5–5.1)
SODIUM: 137 mmol/L (ref 135–145)
TOTAL PROTEIN: 6.5 g/dL (ref 6.5–8.1)

## 2015-04-04 LAB — CBC WITH DIFFERENTIAL/PLATELET
BASOS ABS: 0.1 10*3/uL (ref 0.0–0.1)
Basophils Relative: 1 % (ref 0–1)
EOS ABS: 0.1 10*3/uL (ref 0.0–0.7)
Eosinophils Relative: 2 % (ref 0–5)
HEMATOCRIT: 41.1 % (ref 39.0–52.0)
HEMOGLOBIN: 13.3 g/dL (ref 13.0–17.0)
LYMPHS PCT: 28 % (ref 12–46)
Lymphs Abs: 1.5 10*3/uL (ref 0.7–4.0)
MCH: 34 pg (ref 26.0–34.0)
MCHC: 32.4 g/dL (ref 30.0–36.0)
MCV: 105.1 fL — ABNORMAL HIGH (ref 78.0–100.0)
MONOS PCT: 10 % (ref 3–12)
Monocytes Absolute: 0.5 10*3/uL (ref 0.1–1.0)
NEUTROS PCT: 59 % (ref 43–77)
Neutro Abs: 3.2 10*3/uL (ref 1.7–7.7)
Platelets: 106 10*3/uL — ABNORMAL LOW (ref 150–400)
RBC: 3.91 MIL/uL — AB (ref 4.22–5.81)
RDW: 13 % (ref 11.5–15.5)
WBC: 5.4 10*3/uL (ref 4.0–10.5)

## 2015-04-04 LAB — CK: Total CK: 29 U/L — ABNORMAL LOW (ref 49–397)

## 2015-04-04 LAB — BASIC METABOLIC PANEL
Anion gap: 8 (ref 5–15)
BUN: 17 mg/dL (ref 6–20)
CALCIUM: 11.2 mg/dL — AB (ref 8.9–10.3)
CO2: 28 mmol/L (ref 22–32)
Chloride: 100 mmol/L — ABNORMAL LOW (ref 101–111)
Creatinine, Ser: 1.05 mg/dL (ref 0.61–1.24)
GFR calc Af Amer: >60 mL/min (ref >60)
GLUCOSE: 186 mg/dL — AB (ref 65–99)
Potassium: 4.1 mmol/L (ref 3.5–5.1)
SODIUM: 136 mmol/L (ref 135–145)

## 2015-04-04 LAB — RAPID URINE DRUG SCREEN, HOSP PERFORMED
Amphetamines: NOT DETECTED
BARBITURATES: NOT DETECTED
Benzodiazepines: POSITIVE — AB
Cocaine: NOT DETECTED
Opiates: POSITIVE — AB
TETRAHYDROCANNABINOL: NOT DETECTED

## 2015-04-04 LAB — PROTIME-INR
INR: 1.16 (ref 0.00–1.49)
Prothrombin Time: 15 seconds (ref 11.6–15.2)

## 2015-04-04 LAB — CBC
HCT: 37.6 % — ABNORMAL LOW (ref 39.0–52.0)
Hemoglobin: 12.1 g/dL — ABNORMAL LOW (ref 13.0–17.0)
MCH: 34.3 pg — AB (ref 26.0–34.0)
MCHC: 32.2 g/dL (ref 30.0–36.0)
MCV: 106.5 fL — AB (ref 78.0–100.0)
PLATELETS: 95 10*3/uL — AB (ref 150–400)
RBC: 3.53 MIL/uL — ABNORMAL LOW (ref 4.22–5.81)
RDW: 13.1 % (ref 11.5–15.5)
WBC: 3.6 10*3/uL — AB (ref 4.0–10.5)

## 2015-04-04 LAB — LITHIUM LEVEL: LITHIUM LVL: 0.61 mmol/L (ref 0.60–1.20)

## 2015-04-04 LAB — TSH: TSH: 0.232 u[IU]/mL — AB (ref 0.350–4.500)

## 2015-04-04 LAB — AMMONIA: Ammonia: 49 umol/L — ABNORMAL HIGH (ref 9–35)

## 2015-04-04 MED ORDER — MORPHINE SULFATE (PF) 4 MG/ML IV SOLN
6.0000 mg | Freq: Once | INTRAVENOUS | Status: DC
  Filled 2015-04-04: qty 2

## 2015-04-04 MED ORDER — SODIUM CHLORIDE 0.9 % IV SOLN: INTRAVENOUS | Status: AC

## 2015-04-04 MED ORDER — CLOBETASOL PROPIONATE 0.05 % EX SOLN: 1.0000 "application " | CUTANEOUS | Status: DC

## 2015-04-04 MED ORDER — OXYCODONE HCL 5 MG PO TABS
5.0000 mg | ORAL_TABLET | ORAL | Status: DC | PRN
  Administered 2015-04-04 – 2015-04-08 (×13): 10 mg via ORAL
  Administered 2015-04-09 (×2): 5 mg via ORAL
  Administered 2015-04-10 – 2015-04-11 (×2): 10 mg via ORAL
  Filled 2015-04-04 (×3): qty 2
  Filled 2015-04-04: qty 1
  Filled 2015-04-04 (×11): qty 2
  Filled 2015-04-04: qty 1
  Filled 2015-04-04: qty 2

## 2015-04-04 MED ORDER — LITHIUM CARBONATE 300 MG PO CAPS
900.0000 mg | ORAL_CAPSULE | Freq: Every day | ORAL | Status: DC
  Administered 2015-04-04 – 2015-04-10 (×6): 900 mg via ORAL
  Filled 2015-04-04 (×11): qty 3

## 2015-04-04 MED ORDER — HEPARIN SODIUM (PORCINE) 5000 UNIT/ML IJ SOLN
5000.0000 [IU] | Freq: Three times a day (TID) | INTRAMUSCULAR | Status: DC
  Filled 2015-04-04 (×4): qty 1

## 2015-04-04 MED ORDER — PANTOPRAZOLE SODIUM 40 MG PO TBEC
40.0000 mg | DELAYED_RELEASE_TABLET | Freq: Every day | ORAL | Status: DC
  Administered 2015-04-04 – 2015-04-09 (×5): 40 mg via ORAL
  Filled 2015-04-04 (×6): qty 1

## 2015-04-04 MED ORDER — MORPHINE SULFATE (PF) 2 MG/ML IV SOLN
1.0000 mg | INTRAVENOUS | Status: DC | PRN
  Administered 2015-04-04: 1 mg via INTRAVENOUS
  Filled 2015-04-04: qty 1

## 2015-04-04 MED ORDER — OXYCODONE-ACETAMINOPHEN 5-325 MG PO TABS: 1.0000 | ORAL_TABLET | Freq: Four times a day (QID) | ORAL | Status: DC | PRN

## 2015-04-04 MED ORDER — ASPIRIN 81 MG PO CHEW
81.0000 mg | CHEWABLE_TABLET | Freq: Every day | ORAL | Status: DC
  Administered 2015-04-04 – 2015-04-11 (×7): 81 mg via ORAL
  Filled 2015-04-04 (×7): qty 1

## 2015-04-04 MED ORDER — DEXAMETHASONE SODIUM PHOSPHATE 10 MG/ML IJ SOLN: 10.0000 mg | Freq: Once | INTRAMUSCULAR | Status: DC

## 2015-04-04 MED ORDER — NICOTINE 21 MG/24HR TD PT24
21.0000 mg | MEDICATED_PATCH | Freq: Every day | TRANSDERMAL | Status: DC
  Administered 2015-04-04 – 2015-04-11 (×8): 21 mg via TRANSDERMAL
  Filled 2015-04-04 (×9): qty 1

## 2015-04-04 MED ORDER — TRAZODONE HCL 100 MG PO TABS
200.0000 mg | ORAL_TABLET | Freq: Every day | ORAL | Status: DC
  Administered 2015-04-04 – 2015-04-10 (×6): 200 mg via ORAL
  Filled 2015-04-04: qty 4
  Filled 2015-04-04 (×11): qty 2

## 2015-04-04 NOTE — Progress Notes (Signed)
Pt refused nocturnal CPAP.  Pt states that he has never worn CPAP at night and has never even had a sleep study done.  Pt states that he does not wish to try wearing CPAP while here in the hospital.  RT will continue to monitor.

## 2015-04-04 NOTE — Clinical Social Work Placement (Signed)
   CLINICAL SOCIAL WORK PLACEMENT  NOTE  Date:  04/04/2015  Patient Details  Name: DALE RIBEIRO MRN: [id number] Date of Birth: 11/20/1960  Clinical Social Work is seeking post-discharge placement for this patient at the Skilled  Nursing Facility level of care (*CSW will initial, date and re-position this form in  chart as items are completed):  Yes   Patient/family provided with [facility] Clinical Social Work Department's list of facilities offering this level of care within the geographic area requested by the patient (or if unable, by the patient's family).  Yes   Patient/family informed of their freedom to choose among providers that offer the needed level of care, that participate in Medicare, Medicaid or managed care program needed by the patient, have an available bed and are willing to accept the patient.      Patient/family informed of [facility]'s ownership interest in [facility] and [facility], as well as of the fact that they are under no obligation to receive care at these facilities.  PASRR submitted to EDS on 04/04/15     PASRR number received on       Existing PASRR number confirmed on       FL2 transmitted to all facilities in geographic area requested by pt/family on       FL2 transmitted to all facilities within larger geographic area on       Patient informed that his/her managed care company has contracts with or will negotiate with certain facilities, including the following:            Patient/family informed of bed offers received.  Patient chooses bed at       Physician recommends and patient chooses bed at      Patient to be transferred to   on  .  Patient to be transferred to facility by       Patient family notified on   of transfer.  Name of family member notified:        PHYSICIAN       Additional Comment:    _______________________________________________ Royetta Asal, LCSW  [phone] 04/04/2015, 4:37 PM

## 2015-04-04 NOTE — Clinical Social Work Note (Signed)
Clinical Social Work Assessment  Patient Details  Name: [name] MRN: [id number] Date of Birth: November 27, 1960  Date of referral:  04/04/15               Reason for consult:  Discharge Planning, Facility Placement                Permission sought to share information with:  Chartered certified accountant granted to share information::  Yes, Verbal Permission Granted  Name::        Agency::     Relationship::     Contact Information:     Housing/Transportation Living arrangements for the past 2 months:  Single Family Home Source of Information:  Patient, Spouse Patient Interpreter Needed:  None Criminal Activity/Legal Involvement Pertinent to Current Situation/Hospitalization:  No - Comment as needed Significant Relationships:  Spouse Lives with:  Spouse Do you feel safe going back to the place where you live?   (ST Rehab recommended.) Need for family participation in patient care:  Yes (Comment)  Care giving concerns:  Pt is home alone during the day.    Social Worker assessment / plan:  Pt hospitalized on 04/03/15 under Observation Status for back pain. CSW spoke with MD treatment plan. Pt will be seen today for consult to determine if surgery is needed. PT has recommended ST Rehab at d/c. CSW spoke with spouse and met with pt to assist with d/c planning. Pt has agreed to consider SNF option. Spouse reports that she works during the day and agrees [facility] re[facility] is needed. Spouse found pt on the floor when she returned from work on 8/29. CSW has initiated SNF search and bed offers are pending. CSW will meet in the am with pt / spouse to provide bed offers.  Employment status:  Disabled (Comment on whether or not currently receiving Disability) Insurance information:  Managed Medicare PT Recommendations:  [place] / Referral to community resources:  [place]  Patient/Family's Response to care:  Pt is willing to consider ST Rehab.  Spouse feels rehab is needed.  Patient/Family's Understanding of and Emotional Response to Diagnosis, Current Treatment, and Prognosis:  Spouse spoke with MD for medical update. Pt is aware of his medical status. Spouse voiced concern regarding pt's overall medical status. Emotional Assessment Appearance:  Appears older than stated age Attitude/Demeanor/Rapport:  Other (cooperative) Affect (typically observed):  Calm Orientation:  Oriented to Self, Oriented to Place, Oriented to Situation Alcohol / Substance use:  Alcohol Use Psych involvement (Current and /or in the community):  No (Comment)  Discharge Needs  Concerns to be addressed:  Discharge Planning Concerns Readmission within the last 30 days:  No Current discharge risk:  None Barriers to Discharge:  No Barriers Identified   Luretha Rued, [place] 04/04/2015, 4:23 PM

## 2015-04-04 NOTE — Evaluation (Signed)
Physical Therapy Evaluation Patient Details Name: [name] MRN: [id number] DOB: Jun 30, 1961 Today's Date: 04/04/2015   History of Present Illness  54 yo male admitted with back pain. Hx of peripheral neuropathy, tachycardia, bipolar, avascular necrosis, Hep C, L4-L5 fusion, L3-L4 spinal stenosis  Clinical Impression  On eval, pt was Mod assist for mobility-walked ~75 feet with RW. Mobility limited by pain. High risk for falls. Max cues for safety throughout session. Pt followed commands, during mobility, inconsistently. At this time, recommend [facility] at [facility].     Follow Up Recommendations SNF;Supervision/Assistance - 24 hour    Equipment Recommendations  None recommended by PT    Recommendations for Other Services       Precautions / Restrictions Precautions Precautions: Fall Restrictions Weight Bearing Restrictions: No      Mobility  Bed Mobility Overal bed mobility: Modified Independent             General bed mobility comments: Increased time. VCs safety  Transfers Overall transfer level: Needs assistance Equipment used: Rolling walker (2 wheeled) Transfers: Sit to/from Stand Sit to Stand: Min assist         General transfer comment: assist to rise, stabilize, control descent. VCs safety, hand placement  Ambulation/Gait Ambulation/Gait assistance: Mod assist Ambulation Distance (Feet): 75 Feet Assistive device: Rolling walker (2 wheeled) Gait Pattern/deviations: Trunk flexed;Decreased dorsiflexion - right;Decreased dorsiflexion - left;Decreased step length - right;Decreased step length - left;Narrow base of support     General Gait Details: Max vcs for posture, distance from RW, step length. Pt mainted flexed trunk posture with walker too far ahead depsite cueing and external assist from therapist to correct. Decreased step length bilaterally-so much so that legs tended to lag behind. High risk for falls!  Stairs            Wheelchair  Mobility    Modified Rankin (Stroke Patients Only)       Balance Overall balance assessment: Needs assistance;History of Falls         Standing balance support: Bilateral upper extremity supported;During functional activity Standing balance-Leahy Scale: Poor                               Pertinent Vitals/Pain Pain Assessment: 0-10 Pain Score: 8  Pain Location: back and bil legs-posterior    Home Living Family/patient expects to be discharged to:: Private residence Living Arrangements: Spouse/significant other Available Help at Discharge: Family Type of Home: Mobile home Home Access: Stairs to enter   Entrance Stairs-Number of Steps: 1 Home Layout: One level Home Equipment: Environmental consultant - 2 wheels;Cane - single point      Prior Function Level of Independence: Independent with assistive device(s)         Comments: using cane PTA     Hand Dominance        Extremity/Trunk Assessment   Upper Extremity Assessment: Overall WFL for tasks assessed           Lower Extremity Assessment: Difficult to assess due to impaired cognition;Generalized weakness      Cervical / Trunk Assessment: Kyphotic  Communication   Communication: No difficulties  Cognition Arousal/Alertness: Awake/alert Behavior During Therapy: WFL for tasks assessed/performed Overall Cognitive Status: No family/caregiver present to determine baseline cognitive functioning Area of Impairment: Following commands;Safety/judgement       Following Commands: Follows one step commands inconsistently Safety/Judgement: Decreased awareness of safety;Decreased awareness of deficits  General Comments      Exercises        Assessment/Plan    PT Assessment Patient needs continued PT services  PT Diagnosis Difficulty walking;Abnormality of gait;Generalized weakness;Acute pain;Altered mental status   PT Problem List Decreased strength;Decreased range of motion;Decreased  activity tolerance;Decreased balance;Decreased mobility;Pain;Decreased cognition;Decreased knowledge of use of DME;Decreased safety awareness  PT Treatment Interventions DME instruction;Gait training;Functional mobility training;Therapeutic activities;Patient/family education;Neuromuscular re-education;Therapeutic exercise;Balance training   PT Goals (Current goals can be found in the Care Plan section) Acute Rehab PT Goals Patient Stated Goal: less pain PT Goal Formulation: With patient Time For Goal Achievement: 04/18/15 Potential to Achieve Goals: Fair    Frequency Min 3X/week   Barriers to discharge        Co-evaluation               End of Session Equipment Utilized During Treatment: Gait belt Activity Tolerance: Patient limited by pain;Patient limited by fatigue Patient left: in bed;with call bell/phone within reach;with bed alarm set;with nursing/sitter in room           Time: 1355-1410 PT Time Calculation (min) (ACUTE ONLY): 15 min   Charges:   PT Evaluation $Initial PT Evaluation Tier I: 1 Procedure     PT G Codes:        Rebeca Alert, MPT Pager: [phone]

## 2015-04-04 NOTE — Progress Notes (Signed)
   04/04/15 1459  PT Time Calculation  PT Start Time (ACUTE ONLY) 1355  PT Stop Time (ACUTE ONLY) 1410  PT Time Calculation (min) (ACUTE ONLY) 15 min  PT G-Codes **NOT FOR INPATIENT CLASS**  Functional Assessment Tool Used (clinical judgement)  Functional Limitation Mobility: Walking and moving around  Mobility: Walking and Moving Around Current Status (R6045) CJ  Mobility: Walking and Moving Around Goal Status (W0981) CI  PT General Charges  $$ ACUTE PT VISIT 1 Procedure  PT Evaluation  $Initial PT Evaluation Tier I 1 Procedure   Rebeca Alert, MPT [phone]

## 2015-04-04 NOTE — H&P (Signed)
[facility] History and Physical  [name] ZOX:[id number] DOB: 1960/12/04 DOA: 04/03/2015  Referring physician: Dr. Patria Mane PCP: Arlan Organ., MD   Chief Complaint: lower back discomfort  HPI: [name] is a 54 y.o. male  With history of multiple back surgeries who presents today complaining of lower back discomfort. He is taking pain medication at home but he reports it has not been helping and as a result he has been less active due to the discomfort. He denies any decrease in strength or bowel or bladder incontinence. Mainly states his problems are related to his pain. As a result he presented to the hospital for further evaluation recommendations. While here there is concerns that his wife is unable to care for him at home.  We were subsequently consulted for further medical evaluation recommendations   Review of Systems:  Constitutional:  No weight loss, night sweats, Fevers, chills, fatigue.  HEENT:  No headaches, Difficulty swallowing,Tooth/dental problems,Sore throat,  No sneezing, itching, ear ache, nasal congestion, post nasal drip,  Cardio-vascular:  No chest pain, Orthopnea, PND, swelling in lower extremities, anasarca, dizziness, palpitations  GI:  No heartburn, indigestion, abdominal pain, nausea, vomiting, diarrhea, change in bowel habits, loss of appetite  Resp:  No shortness of breath with exertion or at rest. No excess mucus, no productive cough, No non-productive cough, No coughing up of blood.No change in color of mucus.No wheezing.No chest wall deformity  Skin:  no rash or lesions.  GU:  no dysuria, change in color of urine, no urgency or frequency. No flank pain.  Musculoskeletal:  No joint pain or swelling reported. + back pain.  Psych:  No change in mood or affect. No depression or anxiety. No memory loss.   Past Medical History  Diagnosis Date  . Tachycardia   . Peripheral neuropathy   . Alcohol abuse     hx of quit 4/04  .  Bilateral external ear infections 2007  . Tobacco abuse   . Bipolar affective disorder     Followed by Dr. [name]  . Avascular necrosis     SP right and left hip replacements and L shoulder arthroplasty, secondary to steroid use.  Has been on embrel in past.  . Psoriasis     Previously on embrel, stopped because of price.   . Hypertension   . Hepatitis C   . ARF (acute renal failure) 2008    Multifactorial in setting of over use of ibuprofen and HCTZ./ACEI  . Cirrhosis   . Back pain   . Pneumonia     hx  . Heart murmur   . Stroke 11    ? "mini stroke"  . GERD (gastroesophageal reflux disease)     occ  . Arthritis   . Pharyngeal edema 03/12/2013   Past Surgical History  Procedure Laterality Date  . Right total hip replacement  2004    Due to AVN  . Left total hip replacement  2001    Due to AVN  . Closed manipulation, pinning, application of a traction pin right  2002  . Right shoulder arthroplasty      Due to AVN  . Appendectomy  1989  . Joint replacement Left 02    shoulder  . Tonsillectomy    . Hernia repair    . Anterior fusion cervical spine  03/10/2013  . Anterior cervical decomp/discectomy fusion N/A 03/10/2013    Procedure: Cervical Two-Three, Three to Four, Cervical Four to Five, Cervical Five to Six  anterior cervical decompression with fusion plating and bonegraft;  Surgeon: Hewitt Shorts, MD;  Location: [facility];  Service: Neurosurgery;  Laterality: N/A;  ANTERIOR CERVICAL DECOMPRESSION/DISCECTOMY FUSION 4 LEVELS  . Esophagogastroduodenoscopy N/A 04/11/2014    Procedure: ESOPHAGOGASTRODUODENOSCOPY (EGD);  Surgeon: Graylin Shiver, MD;  Location: [facility];  Service: Endoscopy;  Laterality: N/A;  . Esophagogastroduodenoscopy N/A 04/11/2014    Procedure: ESOPHAGOGASTRODUODENOSCOPY (EGD);  Surgeon: Graylin Shiver, MD;  Location: [facility];  Service: Endoscopy;  Laterality: N/A;   Social History:  reports that he has been smoking Cigarettes.  He has a 35  pack-year smoking history. He has never used smokeless tobacco. He reports that he does not drink alcohol or use illicit drugs.  Allergies  Allergen Reactions  . Erythromycin     REACTION: GI upset  . Hydromorphone Hcl     REACTION: itching  . Prochlorperazine Maleate     REACTION: jaw lock    Family History  Problem Relation Age of Onset  . Hypertension Mother   . Bipolar disorder Father   . Obesity Father   . Obesity Brother     Prior to Admission medications   Medication Sig Start Date End Date Taking? Authorizing Provider  aspirin 81 MG tablet Take 81 mg by mouth daily.   Yes Historical Provider, MD  augmented betamethasone dipropionate (DIPROLENE-AF) 0.05 % ointment Apply 1 application topically 2 (two) times daily. 03/06/15  Yes Historical Provider, MD  clobetasol (TEMOVATE) 0.05 % external solution Apply 1 application topically every 7 (seven) days. 12/13/14  Yes Historical Provider, MD  clobetasol ointment (TEMOVATE) 0.05 % Apply 1 application topically daily as needed. Apply affected areas for psoriasis   Yes Historical Provider, MD  cloNIDine (CATAPRES) 0.3 MG tablet Take 0.3 mg by mouth 2 (two) times daily.   Yes Historical Provider, MD  fluticasone (CUTIVATE) 0.005 % ointment Apply 1 application topically 2 (two) times daily. 03/06/15  Yes Historical Provider, MD  lithium carbonate 300 MG capsule Take 900 mg by mouth at bedtime.    Yes Historical Provider, MD  oxyCODONE-acetaminophen (PERCOCET/ROXICET) 5-325 MG per tablet Take 1 tablet by mouth every 6 (six) hours as needed for moderate pain.  03/18/15  Yes Historical Provider, MD  traZODone (DESYREL) 100 MG tablet Take 200 mg by mouth at bedtime.   Yes Historical Provider, MD  acetaminophen (TYLENOL) 325 MG tablet Take 2 tablets (650 mg total) by mouth every 4 (four) hours as needed. Patient not taking: Reported on 03/29/2015 03/16/13   Stephani Police, PA-C  amLODipine (NORVASC) 5 MG tablet Take 2 tablets (10 mg total) by mouth  daily. Patient not taking: Reported on 03/29/2015 03/16/13   Stephani Police, PA-C  omeprazole (PRILOSEC) 20 MG capsule Take 1 capsule (20 mg total) by mouth 2 (two) times daily. Patient not taking: Reported on 03/29/2015 04/11/14   Rolland Porter, MD   Physical Exam: Filed Vitals:   04/03/15 2200 04/03/15 2230 04/03/15 2300 04/04/15 0337  BP: 110/77 108/75 97/65 112/85  Pulse:   83 70  Temp:    98.3 F (36.8 C)  TempSrc:      Resp: SpO2:   94% 96%    Wt Readings from Last 3 Encounters:  03/29/15 72.576 kg (160 lb)  03/16/13 70.6 kg (155 lb 10.3 oz)  03/05/13 73.738 kg (162 lb 9 oz)    General:  Appears calm and comfortable Eyes: PERRL, normal lids, irises & conjunctiva ENT: grossly normal  hearing, lips & tongue Neck: no LAD, masses or thyromegaly Cardiovascular: RRR, no m/r/g. No LE edema. Respiratory: CTA bilaterally, no w/r/r. Normal respiratory effort. Abdomen: soft, nt, nd Skin: no rash or induration seen on limited exam Musculoskeletal: grossly normal tone BUE/BLE Psychiatric: grossly normal mood and affect, speech fluent and appropriate Neurologic: Strength bilaterally equal at lower extremities, sensation to light touch intact           Labs on Admission:  Basic Metabolic Panel:  Recent Labs Lab 04/03/15 2355  NA 137  K 3.8  CL 100*  CO2 28  GLUCOSE 99  BUN 12  CREATININE 1.01  CALCIUM 11.9*   Liver Function Tests:  Recent Labs Lab 04/03/15 2355  AST 112*  ALT 121*  ALKPHOS 106  BILITOT 1.0  PROT 6.5  ALBUMIN 3.6   No results for input(s): LIPASE, AMYLASE in the last 168 hours.  Recent Labs Lab 04/03/15 2355  AMMONIA 49*   CBC:  Recent Labs Lab 04/03/15 2355  WBC 5.4  NEUTROABS 3.2  HGB 13.3  HCT 41.1  MCV 105.1*  PLT 106*   Cardiac Enzymes:  Recent Labs Lab 04/03/15 2355  CKTOTAL 29*    BNP (last 3 results) No results for input(s): BNP in the last 8760 hours.  ProBNP (last 3 results) No results for input(s):  PROBNP in the last 8760 hours.  CBG: No results for input(s): GLUCAP in the last 168 hours.  Radiological Exams on Admission: Dg Chest 2 View  04/04/2015   CLINICAL DATA:  Status post fall from chair, with weakness and shortness of breath. Initial encounter.  EXAM: CHEST  2 VIEW  COMPARISON:  Chest radiograph performed 03/12/2013  FINDINGS: The lungs are well-aerated and clear. There is no evidence of focal opacification, pleural effusion or pneumothorax.  The heart is borderline normal in size. No acute osseous abnormalities are seen. Bilateral shoulder arthroplasties are noted. Cervical spinal fusion hardware is partially imaged.  IMPRESSION: No acute cardiopulmonary process seen. No displaced rib fractures identified.   Electronically Signed   By: Roanna Raider M.D.   On: 04/04/2015 00:08    Assessment/Plan Active Problems:   Back pain - In the context of patient with double to triple back operations. Most recently had MRI of lumbar spine which reported decompression and fusion of L4-5 with L3-L4 severe spinal stenosis likely to be symptomatic and and inferior endplate Schmorl's node at T12. These are the most likely causes of patient's back pain. Will increase pain medication regimen and obtain physical therapy. - No new weakness reported or bowel or bladder incontinence  Bipolar disorder -Continue home medication regimen  OSA -Continue home C Pap regimen  Code Status:  Full code DVT Prophylaxis: Heparin Family Communication:  Disposition Plan:   Time spent: > 45 minutes  Penny Pia [facility] Pager [phone]

## 2015-04-04 NOTE — Progress Notes (Signed)
PROGRESS NOTE    [name]:[id number] DOB: September 20, 1960 DOA: 04/03/2015 PCP: Arlan Organ., MD  HPI/Brief narrative 54 year old male with history of multiple back surgeries, ongoing alcohol abuse, tobacco abuse, bipolar disorder on lithium, psoriasis, HTN, hepatitis C, cirrhosis, GERD and chronic back pain states that he's been having worsening low back pain since July 4 but for the last 1 week has noted pain in the back of both his legs extending from the hips down to his feet, difficulty to stand up or ambulate for long due to pain and apparently his wife found him on the floor due to severe pain. He denies difficulty with bowel or bladder. He was seen in the ED on 8/24, MRI L-spine was performed and patient was discharged home on when necessary Valium, Percocet, and 5 days of prednisone and advised to follow-up with neurosurgery. Now admitted for severe low back pain. Neurosurgery consulted-input pending.   Assessment/Plan:  Acute on chronic low back pain with sciatica - Reviewed MRI L-spine report from 8/24-results as below. 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 - Consulted patient's primary neurosurgeon Dr. Newell Coral who will see him later this evening and make recommendations. Patient had lumbar surgery 19 years ago and C-spine surgery 2014 but did not return for follow-up with Dr. Newell Coral.  Alcohol abuse/hepatitis C/cirrhosis - Patient continues to drink up to 4 beers daily and vodka occasionally. - CIWA protocol - check INR.  Hypercalcemia - May be related to dehydration. Brief IV normal saline hydration and follow. If has persistent hypercalcemia, will need further workup.  Pancytopenia/macrocytosis - Likely related to ongoing alcohol abuse related bone marrow toxicity and cirrhosis. - Follow CBCs. Alcohol abstinence.  Tobacco abuse - Cessation counseled.  Nicotine patch.  Essential hypertension - Controlled off medications.  Bipolar disorder - Patient on lithium at home-continue. Lithium levels low limits of normal.  Psoriasis - Patient has extensive skin psoriatic lesions on his back, limbs. Outpatient follow-up.  Mildly elevated ammonia - Patient appears coherent. No clear encephalopathy.    DVT prophylaxis: DC subcutaneous heparin and place on SCDs. Code Status: Full. Family Communication: Discussed with patient's spouse on 8/30. Disposition Plan: To be determined pending neurosurgery evaluation   Consultants:  Neurosurgery/Dr. Newell Coral  Procedures:  None  Antibiotics:  None   Subjective: Back and bilateral lower extremity pain better since hospitalization.  Objective: Filed Vitals:   04/04/15 0337 04/04/15 0400 04/04/15 0521 04/04/15 0800  BP: 112/85 110/72 122/79   Pulse: 70 66 71   Temp: 98.3 F (36.8 C)     TempSrc:      Resp: 18  9   Height:    5\' 11"  (1.803 m)  Weight:    70 kg (154 lb 5.2 oz)  SpO2: 96% 95% 99%     Intake/Output Summary (Last 24 hours) at 04/04/15 1459 Last data filed at 04/04/15 1250  Gross per 24 hour  Intake    480 ml  Output    600 ml  Net   -120 ml   Filed Weights   04/04/15 0800  Weight: 70 kg (154 lb 5.2 oz)     Exam:  General exam: Moderately built and thinly nourished pleasant young male sitting up comfortably in bed. Respiratory system: Clear. No increased work of breathing. Cardiovascular system: S1 & S2 heard, RRR. No JVD, murmurs, gallops, clicks or pedal edema. Gastrointestinal  system: Abdomen is nondistended, soft and nontender. Normal bowel sounds heard. Central nervous system: Alert and oriented. No focal neurological deficits. Extremities: Symmetric 5 x 5 power in upper extremities and grade 4 x 5 power in lower extremities. Skin: Extensive psoriatic lesions on back and all limbs.   Data Reviewed: Basic Metabolic Panel:  Recent Labs Lab  04/03/15 2355 04/04/15 0657  NA 137 136  K 3.8 4.1  CL 100* 100*  CO2 28 28  GLUCOSE 99 186*  BUN 12 17  CREATININE 1.01 1.05  CALCIUM 11.9* 11.2*   Liver Function Tests:  Recent Labs Lab 04/03/15 2355  AST 112*  ALT 121*  ALKPHOS 106  BILITOT 1.0  PROT 6.5  ALBUMIN 3.6   No results for input(s): LIPASE, AMYLASE in the last 168 hours.  Recent Labs Lab 04/03/15 2355  AMMONIA 49*   CBC:  Recent Labs Lab 04/03/15 2355 04/04/15 0657  WBC 5.4 3.6*  NEUTROABS 3.2  --   HGB 13.3 12.1*  HCT 41.1 37.6*  MCV 105.1* 106.5*  PLT 106* 95*   Cardiac Enzymes:  Recent Labs Lab 04/03/15 2355  CKTOTAL 29*   BNP (last 3 results) No results for input(s): PROBNP in the last 8760 hours. CBG: No results for input(s): GLUCAP in the last 168 hours.  No results found for this or any previous visit (from the past 240 hour(s)).      Studies: Dg Chest 2 View  04/04/2015   CLINICAL DATA:  Status post fall from chair, with weakness and shortness of breath. Initial encounter.  EXAM: CHEST  2 VIEW  COMPARISON:  Chest radiograph performed 03/12/2013  FINDINGS: The lungs are well-aerated and clear. There is no evidence of focal opacification, pleural effusion or pneumothorax.  The heart is borderline normal in size. No acute osseous abnormalities are seen. Bilateral shoulder arthroplasties are noted. Cervical spinal fusion hardware is partially imaged.  IMPRESSION: No acute cardiopulmonary process seen. No displaced rib fractures identified.   Electronically Signed   By: Roanna Raider M.D.   On: 04/04/2015 00:08   Dg Cervical Spine With Flex & Extend  04/04/2015   CLINICAL DATA:  Multiple back surgeries with recent fall 04/03/2015. Patient now complains of neck and shoulder pain as well as low back pain.  EXAM: CERVICAL SPINE COMPLETE WITH FLEXION AND EXTENSION VIEWS  COMPARISON:  02/16/2013  FINDINGS: Exam demonstrates mild spondylosis throughout the cervical spine. Anterior fusion  hardware is present and intact from C2-C6. Facet arthropathy and uncovertebral spurring are present. Prevertebral soft tissues are within normal. Flexion and extension views are within normal without evidence of instability. Atlantoaxial articulation is within normal. Moderate left-sided neural foraminal narrowing at the C5-6 and C6-7 levels with mild right-sided neural foraminal narrowing at the C4-5 and C5-6 levels.  IMPRESSION: Mild spondylosis of the cervical spine with bilateral neural foraminal narrowing as described.  Anterior fusion hardware from C2-C6 intact. No instability on flexion and extension.   Electronically Signed   By: Elberta Fortis M.D.   On: 04/04/2015 11:53        Scheduled Meds: . aspirin  81 mg Oral Daily  . [START ON 04/05/2015] clobetasol  1 application Topical Q7 days  . heparin  5,000 Units Subcutaneous 3 times per day  . lithium carbonate  900 mg Oral QHS  .  morphine injection  6 mg Intravenous Once  . nicotine  21 mg Transdermal Daily  . pantoprazole  40 mg Oral Daily  . traZODone  200 mg Oral QHS   Continuous Infusions:   Principal Problem:   Back pain Active Problems:   Bipolar disorder   Bipolar 1 disorder, depressed, moderate    Time spent: 45 minutes.    Marcellus Scott, MD, FACP, FHM. [facility] Pager [phone]  If 7PM-7AM, please contact night-coverage [email or web address] Password [email or web address] 04/04/2015, 2:59 PM

## 2015-04-04 NOTE — Consult Note (Signed)
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 Vernell Leep  [name] is an 54 y.o. left-handed white male.  HPI: Patient presented to the [facility] emergency room last night, and was admitted this morning to the triad hospitalist service because of increasing difficulties with low back pain radiating into the lower extremities bilaterally with associated spasms in the lower extremities.  Patient has numerous significant medical comorbidities including hepatitis C, history of alcoholism, cirrhosis, psoriasis, bipolar disorder, hypertension, and chronic tobacco use.  History is notable for a number of spinal surgeries. He underwent an L5-6 lumbar discectomy in [place] in June 1999, a right L5-6 lumbar discectomy for recurrent disc herniation in September 1995 with Dr. Emelia Loron, and I performed a L5-6 lumbar decompression, PLIF, and PLA with ISOLA instrumentation and allograft in April 1997. He did well from that surgery. More recently we performed a 4 level C2-3, C3-4, C4-5, and C5-6 anterior cervical decompression arthrodesis with structural allograft and tether cervical plating in August 2014.  Despite extensive efforts by my staff at the office, the patient never returned for follow-up following the most recent surgery.  Patient presented to the [facility] emergency room on August 24 complaining of low back and bilateral lower extremity pain. MRI of the lumbar spine was performed and revealed evidence of well-healed fusion at the L5-6 level, but advanced degeneration at the L4-5 level with hypertrophic facet arthropathy, circumferential degenerative disc bulging, and resulting multifactorial severe spinal stenosis. Patient return to the [facility] emergency room last night with continued complaints, and was admitted to the triad hospitalist service. Dr. Algis Liming requested neurosurgical consultation this morning.  Past  Medical History:  Past Medical History  Diagnosis Date  . Tachycardia   . Peripheral neuropathy   . Alcohol abuse     hx of quit 4/04  . Bilateral external ear infections 2007  . Tobacco abuse   . Bipolar affective disorder     Followed by Dr. Wallace Going  . Avascular necrosis     SP right and left hip replacements and L shoulder arthroplasty, secondary to steroid use.  Has been on embrel in past.  . Psoriasis     Previously on embrel, stopped because of price.   . Hypertension   . Hepatitis C   . ARF (acute renal failure) 2008    Multifactorial in setting of over use of ibuprofen and HCTZ./ACEI  . Cirrhosis   . Back pain   . Pneumonia     hx  . Heart murmur   . Stroke 11    ? "mini stroke"  . GERD (gastroesophageal reflux disease)     occ  . Arthritis   . Pharyngeal edema 03/12/2013    Past Surgical History:  Past Surgical History  Procedure Laterality Date  . Right total hip replacement  2004    Due to AVN  . Left total hip replacement  2001    Due to AVN  . Closed manipulation, pinning, application of a traction pin right  2002  . Right shoulder arthroplasty      Due to AVN  . Appendectomy  1989  . Joint replacement Left 02    shoulder  . Tonsillectomy    . Hernia repair    . Anterior fusion cervical spine  03/10/2013  . Anterior cervical decomp/discectomy fusion N/A 03/10/2013    Procedure: Cervical Two-Three, Three to Four, Cervical Four to Five, Cervical Five to Six anterior  cervical decompression with fusion plating and bonegraft;  Surgeon: Hosie Spangle, MD;  Location: [place] [facility];  Service: Neurosurgery;  Laterality: N/A;  ANTERIOR CERVICAL DECOMPRESSION/DISCECTOMY FUSION 4 LEVELS  . Esophagogastroduodenoscopy N/A 04/11/2014    Procedure: ESOPHAGOGASTRODUODENOSCOPY (EGD);  Surgeon: Wonda Horner, MD;  Location: [facility];  Service: Endoscopy;  Laterality: N/A;  . Esophagogastroduodenoscopy N/A 04/11/2014    Procedure: ESOPHAGOGASTRODUODENOSCOPY (EGD);   Surgeon: Wonda Horner, MD;  Location: [facility];  Service: Endoscopy;  Laterality: N/A;    Family History:  Family History  Problem Relation Age of Onset  . Hypertension Mother   . Bipolar disorder Father   . Obesity Father   . Obesity Brother     Social History:  reports that he has been smoking Cigarettes.  He has a 35 pack-year smoking history. He has never used smokeless tobacco. He reports that he does not drink alcohol or use illicit drugs.  Allergies:  Allergies  Allergen Reactions  . Erythromycin     REACTION: GI upset  . Hydromorphone Hcl     REACTION: itching  . Prochlorperazine Maleate     REACTION: jaw lock    Medications: I have reviewed the patient's current medications.  ROS:  Notable for those difficulties described in his history present illness and past month history, but is otherwise unremarkable  Physical Examination: Patient is a well-developed well-nourished white male in no acute distress. Blood pressure 149/84, pulse 92, temperature 98.5 F (36.9 C), temperature source Oral, resp. rate 18, height $RemoveBe'5\' 11"'[id number]$  (1.803 m), weight 70 kg (154 lb 5.2 oz), SpO2 100 %.  External:  Extensive psoriatic involvement in the scalp, extremities, and torso. Lungs:  Clear to auscultation, symmetrical respiratory excursion. Heart:  Regular rate and rhythm, normal S1 and S2, no murmur. Abdomen:  Soft, nondistended, bowel sounds present. Extremity:  Moderate clubbing; no cyanosis or edema. Musculoskeletal:  No tenderness over the lumbar spinous process or paralumbar musculature. Negative straight leg raising bilaterally.  Neurological Examination: Motor Examination:  Iliopsoas and quadriceps are 5/5 bilaterally. Left dorsiflexor, EHL, and plantar flexor are 5/5. Right dorsiflexor is 4 minus/5, right EHL is 0-1/5, right plantar flexors 5/5. Sensory Examination:  Decreased diffusely to pinprick without dermatomal pattern to decreased pinprick (patient does have a history of  peripheral neuropathy). Reflex Examination:   Quadriceps are 2+, gastrocnemius are minimal, they're symmetrical bilaterally. Toes are downgoing bilaterally. Gait and Stance Examination:  Not tested due to the nature the patient's condition.   Results for orders placed or performed during the hospital encounter of 04/03/15 (from the past 48 hour(s))  Ethanol     Status: None   Collection Time: 04/03/15  9:37 PM  Result Value Ref Range   Alcohol, Ethyl (B) <5 <5 mg/dL    Comment:        LOWEST DETECTABLE LIMIT FOR SERUM ALCOHOL IS 5 mg/dL FOR MEDICAL PURPOSES ONLY   CBC with Differential/Platelet     Status: Abnormal   Collection Time: 04/03/15 11:55 PM  Result Value Ref Range   WBC 5.4 4.0 - 10.5 K/uL   RBC 3.91 (L) 4.22 - 5.81 MIL/uL   Hemoglobin 13.3 13.0 - 17.0 g/dL   HCT 41.1 39.0 - 52.0 %   MCV 105.1 (H) 78.0 - 100.0 fL   MCH 34.0 26.0 - 34.0 pg   MCHC 32.4 30.0 - 36.0 g/dL   RDW 13.0 11.5 - 15.5 %   Platelets 106 (L) 150 - 400 K/uL    Comment: SPECIMEN  CHECKED FOR CLOTS REPEATED TO VERIFY PLATELET COUNT CONFIRMED BY SMEAR    Neutrophils Relative % 59 43 - 77 %   Lymphocytes Relative 28 12 - 46 %   Monocytes Relative 10 3 - 12 %   Eosinophils Relative 2 0 - 5 %   Basophils Relative 1 0 - 1 %   Neutro Abs 3.2 1.7 - 7.7 K/uL   Lymphs Abs 1.5 0.7 - 4.0 K/uL   Monocytes Absolute 0.5 0.1 - 1.0 K/uL   Eosinophils Absolute 0.1 0.0 - 0.7 K/uL   Basophils Absolute 0.1 0.0 - 0.1 K/uL   Smear Review MORPHOLOGY UNREMARKABLE   Comprehensive metabolic panel     Status: Abnormal   Collection Time: 04/03/15 11:55 PM  Result Value Ref Range   Sodium 137 135 - 145 mmol/L   Potassium 3.8 3.5 - 5.1 mmol/L   Chloride 100 (L) 101 - 111 mmol/L   CO2 28 22 - 32 mmol/L   Glucose, Bld 99 65 - 99 mg/dL   BUN 12 6 - 20 mg/dL   Creatinine, Ser 1.01 0.61 - 1.24 mg/dL   Calcium 11.9 (H) 8.9 - 10.3 mg/dL   Total Protein 6.5 6.5 - 8.1 g/dL   Albumin 3.6 3.5 - 5.0 g/dL   AST 112 (H) 15 - 41  U/L   ALT 121 (H) 17 - 63 U/L   Alkaline Phosphatase 106 38 - 126 U/L   Total Bilirubin 1.0 0.3 - 1.2 mg/dL   GFR calc non Af Amer >60 >60 mL/min   GFR calc Af Amer >60 >60 mL/min    Comment: (NOTE) The eGFR has been calculated using the CKD EPI equation. This calculation has not been validated in all clinical situations. eGFR's persistently <60 mL/min signify possible Chronic Kidney Disease.    Anion gap 9 5 - 15  Ammonia     Status: Abnormal   Collection Time: 04/03/15 11:55 PM  Result Value Ref Range   Ammonia 49 (H) 9 - 35 umol/L  CK     Status: Abnormal   Collection Time: 04/03/15 11:55 PM  Result Value Ref Range   Total CK 29 (L) 49 - 397 U/L  Lithium level     Status: None   Collection Time: 04/03/15 11:55 PM  Result Value Ref Range   Lithium Lvl 0.61 0.60 - 1.20 mmol/L  Urine rapid drug screen (hosp performed)     Status: Abnormal   Collection Time: 04/04/15  6:30 AM  Result Value Ref Range   Opiates POSITIVE (A) NONE DETECTED   Cocaine NONE DETECTED NONE DETECTED   Benzodiazepines POSITIVE (A) NONE DETECTED   Amphetamines NONE DETECTED NONE DETECTED   Tetrahydrocannabinol NONE DETECTED NONE DETECTED   Barbiturates NONE DETECTED NONE DETECTED    Comment:        DRUG SCREEN FOR MEDICAL PURPOSES ONLY.  IF CONFIRMATION IS NEEDED FOR ANY PURPOSE, NOTIFY LAB WITHIN 5 DAYS.        LOWEST DETECTABLE LIMITS FOR URINE DRUG SCREEN Drug Class       Cutoff (ng/mL) Amphetamine      1000 Barbiturate      200 Benzodiazepine   765 Tricyclics       465 Opiates          300 Cocaine          300 THC              50   CBC     Status: Abnormal  Collection Time: 04/04/15  6:57 AM  Result Value Ref Range   WBC 3.6 (L) 4.0 - 10.5 K/uL   RBC 3.53 (L) 4.22 - 5.81 MIL/uL   Hemoglobin 12.1 (L) 13.0 - 17.0 g/dL   HCT 37.6 (L) 39.0 - 52.0 %   MCV 106.5 (H) 78.0 - 100.0 fL   MCH 34.3 (H) 26.0 - 34.0 pg   MCHC 32.2 30.0 - 36.0 g/dL   RDW 13.1 11.5 - 15.5 %   Platelets 95 (L)  150 - 400 K/uL    Comment: CONSISTENT WITH PREVIOUS RESULT  Basic metabolic panel     Status: Abnormal   Collection Time: 04/04/15  6:57 AM  Result Value Ref Range   Sodium 136 135 - 145 mmol/L   Potassium 4.1 3.5 - 5.1 mmol/L   Chloride 100 (L) 101 - 111 mmol/L   CO2 28 22 - 32 mmol/L   Glucose, Bld 186 (H) 65 - 99 mg/dL   BUN 17 6 - 20 mg/dL   Creatinine, Ser 1.05 0.61 - 1.24 mg/dL   Calcium 11.2 (H) 8.9 - 10.3 mg/dL   GFR calc non Af Amer >60 >60 mL/min   GFR calc Af Amer >60 >60 mL/min    Comment: (NOTE) The eGFR has been calculated using the CKD EPI equation. This calculation has not been validated in all clinical situations. eGFR's persistently <60 mL/min signify possible Chronic Kidney Disease.    Anion gap 8 5 - 15  TSH     Status: Abnormal   Collection Time: 04/04/15  4:25 PM  Result Value Ref Range   TSH 0.232 (L) 0.350 - 4.500 uIU/mL    Dg Chest 2 View  04/04/2015   CLINICAL DATA:  Status post fall from chair, with weakness and shortness of breath. Initial encounter.  EXAM: CHEST  2 VIEW  COMPARISON:  Chest radiograph performed 03/12/2013  FINDINGS: The lungs are well-aerated and clear. There is no evidence of focal opacification, pleural effusion or pneumothorax.  The heart is borderline normal in size. No acute osseous abnormalities are seen. Bilateral shoulder arthroplasties are noted. Cervical spinal fusion hardware is partially imaged.  IMPRESSION: No acute cardiopulmonary process seen. No displaced rib fractures identified.   Electronically Signed   By: Garald Balding M.D.   On: 04/04/2015 00:08   Dg Cervical Spine With Flex & Extend  04/04/2015   CLINICAL DATA:  Multiple back surgeries with recent fall 04/03/2015. Patient now complains of neck and shoulder pain as well as low back pain.  EXAM: CERVICAL SPINE COMPLETE WITH FLEXION AND EXTENSION VIEWS  COMPARISON:  02/16/2013  FINDINGS: Exam demonstrates mild spondylosis throughout the cervical spine. Anterior fusion  hardware is present and intact from C2-C6. Facet arthropathy and uncovertebral spurring are present. Prevertebral soft tissues are within normal. Flexion and extension views are within normal without evidence of instability. Atlantoaxial articulation is within normal. Moderate left-sided neural foraminal narrowing at the C5-6 and C6-7 levels with mild right-sided neural foraminal narrowing at the C4-5 and C5-6 levels.  IMPRESSION: Mild spondylosis of the cervical spine with bilateral neural foraminal narrowing as described.  Anterior fusion hardware from C2-C6 intact. No instability on flexion and extension.   Electronically Signed   By: Marin Olp M.D.   On: 04/04/2015 11:53   MRI LUMBAR SPINE WITHOUT CONTRAST  TECHNIQUE: Multiplanar, multisequence MR imaging of the lumbar spine was performed. No intravenous contrast was administered.  COMPARISON: Radiography same day.  FINDINGS: T11-12: Normal.  T12-L1: Inferior endplate  Schmorl's node at T12 with regional edema. This could be associated with back pain. Minimal bulging of the disc. No apparent neural compression.  L1-2: Mild desiccation of the disc. No stenosis.  L2-3: Normal interspace.  L3-4: Disc degeneration with circumferential protrusion of disc material. Facet degeneration with facet and ligamentous hypertrophy. Severe spinal stenosis at this level that could be symptomatic.  L4-5: Distant decompression and fusion. Wide patency of the canal and foramina.  L5-S1: No disc pathology. Mild facet degeneration. No stenosis.  IMPRESSION: Previous decompression and fusion at L4-5.  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  Inferior endplate Schmorl's node at T12 with associated marrow edema. This could be a cause of back pain.   Electronically Signed  By: Nelson Chimes M.D.  On: 03/29/2015 15:15  Assessment/Plan: Patient presenting  with low back pain and pain radiating through the lower extremities associated with muscular spasms consistent with neurogenic claudication.  He is status post a previous L5-6 lumbar decompression, PLIF, and PLA nearly 19-1/2 years ago, from which he did well.  MRI last week revealed advanced degeneration at the L4-5 level with resulting severe multifactorial lumbar stenosis corresponding to his symptoms. On exam he does have some weakness of the right dorsiflexor and EHL.  From a solely surgical perspective decompression and stabilization at the L4-5 level has a good chance of relieving his back pain and neurogenic claudication. However he has significant comorbidities including thrombocytopenia, hepatic dysfunction, and uncertain coagulation function. I've spoken with Dr. Algis Liming both this morning and again this evening regarding the patient's medical suitability for major lumbar spinal surgery. He explained this evening that he is requesting an INR to assess his coagulation function. I've asked that the triad hospitalist who takes over in the morning contact me so that we can discuss the results and have another opinion as well regarding the patient's medical suitability for major lumbar spinal surgery.  I've also spoke with the patient at length his bedside (for about 45 minutes) regarding the nature of his condition and its relationship to his symptoms and difficulties, the consideration for surgical intervention, but also the concerns regarding his comorbidities.   We discussed the nature surgery specifically an L4 and L5 decompressive lumbar laminectomy, a bilateral L4-5 lumbar facetectomy and foraminotomies, removal of existing posterior instrumentation at the L5-6 level, bilateral L4-5 posterior lumbar interbody arthrodesis with interbody implants and bone graft, and a bilateral L4-5 posterior lateral arthrodesis with posterior instrumentation and bone graft.   We discussed risks of surgery, and  I've explained to him that many of these are increased due to his medical comorbidities including risks of infection, bleeding, possible need for transfusion of red blood cells, platelets, and fresh frozen plasma, the risks of neurologic dysfunction with increased pain, weakness, numbness, or paresthesias, the risks of dural tear and CSF leakage and possible for further surgery, the risk of failure of the arthrodesis and possible need for further surgery, and anesthetic risks of myocardial infarction, stroke, pneumonia, and death.  The patient is certainly anxious to get relief of his back pain and neurogenic claudication, however I've emphasized to him surgery certain entails greater risks because of his medical comorbidities.  Most likely a decision will be made tomorrow whether to have the patient transferred from the [facility] hospitalist service at [facility] to the triad hospitalist service at [facility], with the anticipation of proceeding with surgery on September 1, if cleared to  do so.  Hosie Spangle, MD 04/04/2015, 7:01 PM

## 2015-04-05 DIAGNOSIS — M5489 Other dorsalgia: Secondary | ICD-10-CM | POA: Diagnosis not present

## 2015-04-05 DIAGNOSIS — F3132 Bipolar disorder, current episode depressed, moderate: Secondary | ICD-10-CM | POA: Diagnosis not present

## 2015-04-05 LAB — ABO/RH: ABO/RH(D): B POS

## 2015-04-05 LAB — COMPREHENSIVE METABOLIC PANEL
ALBUMIN: 3.2 g/dL — AB (ref 3.5–5.0)
ALK PHOS: 82 U/L (ref 38–126)
ALT: 83 U/L — ABNORMAL HIGH (ref 17–63)
ANION GAP: 4 — AB (ref 5–15)
AST: 68 U/L — AB (ref 15–41)
BILIRUBIN TOTAL: 1.2 mg/dL (ref 0.3–1.2)
BUN: 14 mg/dL (ref 6–20)
CO2: 31 mmol/L (ref 22–32)
Calcium: 10.6 mg/dL — ABNORMAL HIGH (ref 8.9–10.3)
Chloride: 103 mmol/L (ref 101–111)
Creatinine, Ser: 0.76 mg/dL (ref 0.61–1.24)
GFR calc Af Amer: >60 mL/min (ref >60)
GFR calc non Af Amer: >60 mL/min (ref >60)
GLUCOSE: 90 mg/dL (ref 65–99)
POTASSIUM: 3.6 mmol/L (ref 3.5–5.1)
SODIUM: 138 mmol/L (ref 135–145)
Total Protein: 5.8 g/dL — ABNORMAL LOW (ref 6.5–8.1)

## 2015-04-05 LAB — URINALYSIS, ROUTINE W REFLEX MICROSCOPIC
Glucose, UA: NEGATIVE mg/dL
Hgb urine dipstick: NEGATIVE
KETONES UR: NEGATIVE mg/dL
LEUKOCYTES UA: NEGATIVE
NITRITE: NEGATIVE
PROTEIN: NEGATIVE mg/dL
Specific Gravity, Urine: 1.02 (ref 1.005–1.030)
UROBILINOGEN UA: 4 mg/dL — AB (ref 0.0–1.0)
pH: 7 (ref 5.0–8.0)

## 2015-04-05 LAB — CBC
HEMATOCRIT: 33.9 % — AB (ref 39.0–52.0)
HEMOGLOBIN: 11.2 g/dL — AB (ref 13.0–17.0)
MCH: 34.6 pg — AB (ref 26.0–34.0)
MCHC: 33 g/dL (ref 30.0–36.0)
MCV: 104.6 fL — ABNORMAL HIGH (ref 78.0–100.0)
Platelets: 87 10*3/uL — ABNORMAL LOW (ref 150–400)
RBC: 3.24 MIL/uL — ABNORMAL LOW (ref 4.22–5.81)
RDW: 12.8 % (ref 11.5–15.5)
WBC: 4 10*3/uL (ref 4.0–10.5)

## 2015-04-05 LAB — TYPE AND SCREEN
ABO/RH(D): B POS
Antibody Screen: NEGATIVE

## 2015-04-05 LAB — HEMOGLOBIN A1C
HEMOGLOBIN A1C: 5.1 % (ref 4.8–5.6)
Mean Plasma Glucose: 100 mg/dL

## 2015-04-05 LAB — T4, FREE: Free T4: 0.82 ng/dL (ref 0.61–1.12)

## 2015-04-05 MED ORDER — CEFAZOLIN SODIUM-DEXTROSE 2-3 GM-% IV SOLR
2.0000 g | INTRAVENOUS | Status: AC
  Administered 2015-04-06 (×2): 2 g via INTRAVENOUS
  Filled 2015-04-05: qty 50

## 2015-04-05 MED ORDER — CLONIDINE HCL 0.1 MG PO TABS
0.3000 mg | ORAL_TABLET | Freq: Two times a day (BID) | ORAL | Status: DC
  Administered 2015-04-05 – 2015-04-06 (×3): 0.3 mg via ORAL
  Filled 2015-04-05 (×4): qty 3

## 2015-04-05 MED ORDER — HYDRALAZINE HCL 20 MG/ML IJ SOLN: 10.0000 mg | Freq: Four times a day (QID) | INTRAMUSCULAR | Status: DC | PRN

## 2015-04-05 NOTE — Care Management Note (Signed)
Case Management Note  Patient Details  Name: [name] MRN: [id number] Date of Birth: 03-11-1961  Subjective/Objective:                   lower back discomfort Action/Plan:  Discharge planning Expected Discharge Date:  04/06/15               Expected Discharge Plan:  Skilled Nursing Facility  In-House Referral:     Discharge planning Services  CM Consult  Post Acute Care Choice:    Choice offered to:     DME Arranged:    DME Agency:     HH Arranged:    HH Agency:     Status of Service:  Completed, signed off  Medicare Important Message Given:    Date Medicare IM Given:    Medicare IM give by:    Date Additional Medicare IM Given:    Additional Medicare Important Message give by:     If discussed at Long Length of Stay Meetings, dates discussed:    Additional Comments: CM notes pt to go to SNF; CSW arranging.  No other CM needs were communicated. Yves Dill, RN 04/05/2015, 10:11 AM

## 2015-04-05 NOTE — Progress Notes (Signed)
PROGRESS NOTE    KANDEN CAREY MVH:[id number] DOB: 1961-06-02 DOA: 04/03/2015 PCP: Arlan Organ., MD  HPI/Brief narrative 54 year old male with history of multiple back surgeries, ongoing alcohol abuse, tobacco abuse, bipolar disorder on lithium, psoriasis, HTN, hepatitis C, cirrhosis, GERD and chronic back pain states that he's been having worsening low back pain since July 4 but for the last 1 week has noted pain in the back of both his legs extending from the hips down to his feet, difficulty to stand up or ambulate for long due to pain and apparently his wife found him on the floor due to severe pain. He denies difficulty with bowel or bladder. He was seen in the ED on 8/24, MRI L-spine was performed and patient was discharged home on when necessary Valium, Percocet, and 5 days of prednisone and advised to follow-up with neurosurgery. Now admitted for severe low back pain. Neurosurgery consulted - recommended surgical intervention in am. [facility].    Assessment/Plan:  Acute on chronic low back pain with sciatica - Reviewed MRI L-spine report from 8/24-results as below. 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 - Consulted patient's primary neurosurgeon Dr. Newell Coral  Recommended surgical repair with decompression in am. He is low to intermediate risk for cardiovascular complications because of his various medical issues.  Alcohol abuse/hepatitis C/cirrhosis - Patient continues to drink up to 4 beers daily and vodka occasionally. - CIWA protocol - INR is 1.1  Hypercalcemia - May be related to dehydration. Brief IV normal saline hydration and follow. Calcium level improved to 10.6, but still slightly high.   Pancytopenia/macrocytosis - Likely related to ongoing alcohol abuse related bone marrow toxicity and cirrhosis. - Follow CBCs. Alcohol  abstinence. - platelet transfusion as needed to keep ti greater than 1,00,000.   Tobacco abuse - Cessation counseled. Nicotine patch.  Essential hypertension - Controlled off medications.  Bipolar disorder - Patient on lithium at home-continue. Lithium levels low limits of normal.  Psoriasis - Patient has extensive skin psoriatic lesions on his back, limbs. Outpatient follow-up.  Mildly elevated ammonia - Patient appears coherent. No clear encephalopathy.    DVT prophylaxis: DC subcutaneous heparin and place on SCDs. Code Status: Full. Family Communication: non at bedside. . Disposition Plan: Transferred to [facility].    Consultants:  Neurosurgery/Dr. Newell Coral  Procedures:  None  Antibiotics:  None   Subjective: Back and bilateral lower extremity pain better since hospitalization. Pain not well controlled.   Objective: Filed Vitals:   04/05/15 0608 04/05/15 1236 04/05/15 1329 04/05/15 1503  BP: 138/87 154/106 150/99 111/74  Pulse: 108 102 107 93  Temp: 99 F (37.2 C) 99.1 F (37.3 C)  98.6 F (37 C)  TempSrc: Oral   Oral  Resp: Height:      Weight:      SpO2: 94% 99% 100% 100%    Intake/Output Summary (Last 24 hours) at 04/05/15 1717 Last data filed at 04/05/15 0900  Gross per 24 hour  Intake 1472.92 ml  Output    890 ml  Net 582.92 ml   Filed Weights   04/04/15 0800  Weight: 70 kg (154 lb 5.2 oz)     Exam:  General exam: Moderately built and thinly nourished pleasant young male sitting up comfortably in bed. Respiratory system: Clear. No increased work of breathing. Cardiovascular  system: S1 & S2 heard, RRR. No JVD, murmurs, gallops, clicks or pedal edema. Gastrointestinal system: Abdomen is nondistended, soft and nontender. Normal bowel sounds heard. Central nervous system: Alert and oriented. No focal neurological deficits. Extremities: Symmetric 5 x 5 power in upper extremities and grade 4 x 5 power in lower extremities. Skin:  Extensive psoriatic lesions on back and all limbs.   Data Reviewed: Basic Metabolic Panel:  Recent Labs Lab 04/03/15 2355 04/04/15 0657 04/05/15 0440  NA 137 136 138  K 3.8 4.1 3.6  CL 100* 100* 103  CO2 28 28 31   GLUCOSE 99 186* 90  BUN 12 17 14   CREATININE 1.01 1.05 0.76  CALCIUM 11.9* 11.2* 10.6*   Liver Function Tests:  Recent Labs Lab 04/03/15 2355 04/05/15 0440  AST 112* 68*  ALT 121* 83*  ALKPHOS 106 82  BILITOT 1.0 1.2  PROT 6.5 5.8*  ALBUMIN 3.6 3.2*   No results for input(s): LIPASE, AMYLASE in the last 168 hours.  Recent Labs Lab 04/03/15 2355  AMMONIA 49*   CBC:  Recent Labs Lab 04/03/15 2355 04/04/15 0657 04/05/15 0440  WBC 5.4 3.6* 4.0  NEUTROABS 3.2  --   --   HGB 13.3 12.1* 11.2*  HCT 41.1 37.6* 33.9*  MCV 105.1* 106.5* 104.6*  PLT 106* 95* 87*   Cardiac Enzymes:  Recent Labs Lab 04/03/15 2355  CKTOTAL 29*   BNP (last 3 results) No results for input(s): PROBNP in the last 8760 hours. CBG: No results for input(s): GLUCAP in the last 168 hours.  No results found for this or any previous visit (from the past 240 hour(s)).      Studies: Dg Chest 2 View  04/04/2015   CLINICAL DATA:  Status post fall from chair, with weakness and shortness of breath. Initial encounter.  EXAM: CHEST  2 VIEW  COMPARISON:  Chest radiograph performed 03/12/2013  FINDINGS: The lungs are well-aerated and clear. There is no evidence of focal opacification, pleural effusion or pneumothorax.  The heart is borderline normal in size. No acute osseous abnormalities are seen. Bilateral shoulder arthroplasties are noted. Cervical spinal fusion hardware is partially imaged.  IMPRESSION: No acute cardiopulmonary process seen. No displaced rib fractures identified.   Electronically Signed   By: Roanna Raider M.D.   On: 04/04/2015 00:08   Dg Cervical Spine With Flex & Extend  04/04/2015   CLINICAL DATA:  Multiple back surgeries with recent fall 04/03/2015. Patient  now complains of neck and shoulder pain as well as low back pain.  EXAM: CERVICAL SPINE COMPLETE WITH FLEXION AND EXTENSION VIEWS  COMPARISON:  02/16/2013  FINDINGS: Exam demonstrates mild spondylosis throughout the cervical spine. Anterior fusion hardware is present and intact from C2-C6. Facet arthropathy and uncovertebral spurring are present. Prevertebral soft tissues are within normal. Flexion and extension views are within normal without evidence of instability. Atlantoaxial articulation is within normal. Moderate left-sided neural foraminal narrowing at the C5-6 and C6-7 levels with mild right-sided neural foraminal narrowing at the C4-5 and C5-6 levels.  IMPRESSION: Mild spondylosis of the cervical spine with bilateral neural foraminal narrowing as described.  Anterior fusion hardware from C2-C6 intact. No instability on flexion and extension.   Electronically Signed   By: Elberta Fortis M.D.   On: 04/04/2015 11:53        Scheduled Meds: . aspirin  81 mg Oral Daily  . [START ON 04/06/2015]  ceFAZolin (ANCEF) IV  2 g Intravenous To SS-Surg  . clobetasol  1 application Topical Q7 days  . cloNIDine  0.3 mg Oral BID  . lithium carbonate  900 mg Oral QHS  . nicotine  21 mg Transdermal Daily  . pantoprazole  40 mg Oral Daily  . traZODone  200 mg Oral QHS   Continuous Infusions:   Principal Problem:   Back pain Active Problems:   Bipolar disorder   Bipolar 1 disorder, depressed, moderate   Lumbago   Tobacco abuse   Uncomplicated alcohol dependence   Other pancytopenia    Time spent: 25 minutes.    Kathlen Mody, MD,[facility] Pager [phone]  If 7PM-7AM, please contact night-coverage [email or web address] Password [facility] 04/05/2015, 5:17 PM

## 2015-04-05 NOTE — Progress Notes (Addendum)
Met with pt this am to assist with d/c planning. Pt reports he will have back surgery at [facility] prior to d/c. Pt reports that  he will consider ST Rehab, if this is recommended, following his surgery. CSW spoke with Dr. Karleen Hampshire to confirm plan.  Werner Lean LCSW [phone]  12:32 Pt will be transferred to [facility] this afternoon for back surgery. Pt has SNF bed offers, if needed. Pt has a 30 day PASRR, as well, if needed.  Werner Lean LCSW [phone]

## 2015-04-05 NOTE — Clinical Social Work Note (Signed)
CSW received a handoff from CSW [place]. This CSW will continue to follow and assist with discharge.   Paeton Latouche, MSW, LCSWA [phone]

## 2015-04-05 NOTE — Progress Notes (Signed)
Pt refusing cpap at this time, RT informed pt to call for RT if he changes his mind during the night 

## 2015-04-05 NOTE — Progress Notes (Signed)
Subjective: Patient transferred from [facility] today to [facility]. Comfortable and resting in bed. Patient found to have a normal prothrombin time and INR.  Objective: Vital signs in last 24 hours: Filed Vitals:   04/05/15 0608 04/05/15 1236 04/05/15 1329 04/05/15 1503  BP: 138/87 154/106 150/99 111/74  Pulse: 108 102 107 93  Temp: 99 F (37.2 C) 99.1 F (37.3 C)  98.6 F (37 C)  TempSrc: Oral   Oral  Resp: Height:      Weight:      SpO2: 94% 99% 100% 100%    Intake/Output from previous day: 08/30 0701 - 08/31 0700 In: 1472.9 [P.O.:600; I.V.:872.9] Out: 1340 [Urine:1340] Intake/Output this shift: Total I/O In: 240 [P.O.:240] Out: -   Physical Exam:  No change in distal right lower extremity weakness.  CBC  Recent Labs  04/04/15 0657 04/05/15 0440  WBC 3.6* 4.0  HGB 12.1* 11.2*  HCT 37.6* 33.9*  PLT 95* 87*   BMET  Recent Labs  04/04/15 0657 04/05/15 0440  NA 136 138  K 4.1 3.6  CL 100* 103  CO2 28 31  GLUCOSE 186* 90  BUN 17 14  CREATININE 1.05 0.76  CALCIUM 11.2* 10.6*    Studies/Results: Dg Chest 2 View  04/04/2015   CLINICAL DATA:  Status post fall from chair, with weakness and shortness of breath. Initial encounter.  EXAM: CHEST  2 VIEW  COMPARISON:  Chest radiograph performed 03/12/2013  FINDINGS: The lungs are well-aerated and clear. There is no evidence of focal opacification, pleural effusion or pneumothorax.  The heart is borderline normal in size. No acute osseous abnormalities are seen. Bilateral shoulder arthroplasties are noted. Cervical spinal fusion hardware is partially imaged.  IMPRESSION: No acute cardiopulmonary process seen. No displaced rib fractures identified.   Electronically Signed   By: Roanna Raider M.D.   On: 04/04/2015 00:08   Dg Cervical Spine With Flex & Extend  04/04/2015   CLINICAL DATA:  Multiple back surgeries with recent fall 04/03/2015. Patient now complains of  neck and shoulder pain as well as low back pain.  EXAM: CERVICAL SPINE COMPLETE WITH FLEXION AND EXTENSION VIEWS  COMPARISON:  02/16/2013  FINDINGS: Exam demonstrates mild spondylosis throughout the cervical spine. Anterior fusion hardware is present and intact from C2-C6. Facet arthropathy and uncovertebral spurring are present. Prevertebral soft tissues are within normal. Flexion and extension views are within normal without evidence of instability. Atlantoaxial articulation is within normal. Moderate left-sided neural foraminal narrowing at the C5-6 and C6-7 levels with mild right-sided neural foraminal narrowing at the C4-5 and C5-6 levels.  IMPRESSION: Mild spondylosis of the cervical spine with bilateral neural foraminal narrowing as described.  Anterior fusion hardware from C2-C6 intact. No instability on flexion and extension.   Electronically Signed   By: Elberta Fortis M.D.   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 He has weakness in the right dorsiflexor and EHL. He has moderate thrombocytopenia, but normal coagulation profile. He does wish to proceed with surgery, and accepts the inherent risks as well as the increased risks associated with his medical comorbidities. Orders written, and patient's questions answered for him.   Hewitt Shorts, MD 04/05/2015, 5:35 PM

## 2015-04-05 NOTE — Progress Notes (Signed)
Patient  Admitted from [facility]. Patient alert and oriented x 4. Patient oriented to room. Will continue to monitor.

## 2015-04-06 ENCOUNTER — Encounter (HOSPITAL_COMMUNITY)
Admission: EM | Disposition: A | Discharge status: Skilled Nursing Facility | Payer: Medicare Other | Source: Home / Self Care | Attending: Internal Medicine

## 2015-04-06 ENCOUNTER — Encounter (HOSPITAL_COMMUNITY): Payer: Self-pay | Admitting: Certified Registered Nurse Anesthetist

## 2015-04-06 ENCOUNTER — Observation Stay (HOSPITAL_COMMUNITY): Payer: Medicare Other

## 2015-04-06 ENCOUNTER — Observation Stay (HOSPITAL_COMMUNITY): Payer: Medicare Other | Admitting: Certified Registered Nurse Anesthetist

## 2015-04-06 DIAGNOSIS — M5489 Other dorsalgia: Secondary | ICD-10-CM | POA: Diagnosis not present

## 2015-04-06 DIAGNOSIS — M4806 Spinal stenosis, lumbar region: Secondary | ICD-10-CM | POA: Diagnosis not present

## 2015-04-06 DIAGNOSIS — D61818 Other pancytopenia: Secondary | ICD-10-CM | POA: Diagnosis not present

## 2015-04-06 DIAGNOSIS — F3132 Bipolar disorder, current episode depressed, moderate: Secondary | ICD-10-CM | POA: Diagnosis not present

## 2015-04-06 DIAGNOSIS — Z720 Tobacco use: Secondary | ICD-10-CM | POA: Diagnosis not present

## 2015-04-06 DIAGNOSIS — F1020 Alcohol dependence, uncomplicated: Secondary | ICD-10-CM | POA: Diagnosis not present

## 2015-04-06 LAB — SURGICAL PCR SCREEN
MRSA, PCR: NEGATIVE
Staphylococcus aureus: POSITIVE — AB

## 2015-04-06 LAB — T3, FREE: T3 FREE: 2 pg/mL (ref 2.0–4.4)

## 2015-04-06 SURGERY — POSTERIOR LUMBAR FUSION 1 WITH HARDWARE REMOVAL
Anesthesia: General | Site: Back

## 2015-04-06 MED ORDER — PHENYLEPHRINE HCL 10 MG/ML IJ SOLN
INTRAMUSCULAR | Status: DC | PRN
  Administered 2015-04-06: 40 ug via INTRAVENOUS
  Administered 2015-04-06: 80 ug via INTRAVENOUS
  Administered 2015-04-06: 40 ug via INTRAVENOUS
  Administered 2015-04-06: 80 ug via INTRAVENOUS

## 2015-04-06 MED ORDER — SODIUM CHLORIDE 0.9 % IR SOLN
Status: DC | PRN
  Administered 2015-04-06: 1000 mL

## 2015-04-06 MED ORDER — VITAMIN B-1 100 MG PO TABS
100.0000 mg | ORAL_TABLET | Freq: Every day | ORAL | Status: DC
  Administered 2015-04-06 – 2015-04-11 (×6): 100 mg via ORAL
  Filled 2015-04-06 (×6): qty 1

## 2015-04-06 MED ORDER — DEXAMETHASONE SODIUM PHOSPHATE 4 MG/ML IJ SOLN
INTRAMUSCULAR | Status: DC | PRN
  Administered 2015-04-06: 4 mg via INTRAVENOUS

## 2015-04-06 MED ORDER — THROMBIN 20000 UNITS EX SOLR
CUTANEOUS | Status: DC | PRN
  Administered 2015-04-06: 20 mL via TOPICAL

## 2015-04-06 MED ORDER — MORPHINE SULFATE (PF) 2 MG/ML IV SOLN: 1.0000 mg | INTRAVENOUS | Status: DC | PRN

## 2015-04-06 MED ORDER — LIDOCAINE-EPINEPHRINE 1 %-1:100000 IJ SOLN
INTRAMUSCULAR | Status: DC | PRN
  Administered 2015-04-06: 10 mL

## 2015-04-06 MED ORDER — PROMETHAZINE HCL 25 MG/ML IJ SOLN: 6.2500 mg | INTRAMUSCULAR | Status: DC | PRN

## 2015-04-06 MED ORDER — VANCOMYCIN HCL 1000 MG IV SOLR
INTRAVENOUS | Status: AC
  Filled 2015-04-06: qty 1000

## 2015-04-06 MED ORDER — ESMOLOL HCL 10 MG/ML IV SOLN
INTRAVENOUS | Status: DC | PRN
  Administered 2015-04-06: 10 mg via INTRAVENOUS
  Administered 2015-04-06: 5 mg via INTRAVENOUS

## 2015-04-06 MED ORDER — LACTATED RINGERS IV SOLN
INTRAVENOUS | Status: DC | PRN
  Administered 2015-04-06 (×2): via INTRAVENOUS

## 2015-04-06 MED ORDER — THIAMINE HCL 100 MG/ML IJ SOLN
100.0000 mg | Freq: Every day | INTRAMUSCULAR | Status: DC
  Filled 2015-04-06: qty 2

## 2015-04-06 MED ORDER — PHENYLEPHRINE HCL 10 MG/ML IJ SOLN
10.0000 mg | INTRAVENOUS | Status: DC | PRN
  Administered 2015-04-06: 10 ug/min via INTRAVENOUS

## 2015-04-06 MED ORDER — ROCURONIUM BROMIDE 100 MG/10ML IV SOLN
INTRAVENOUS | Status: DC | PRN
  Administered 2015-04-06 (×2): 10 mg via INTRAVENOUS
  Administered 2015-04-06: 40 mg via INTRAVENOUS
  Administered 2015-04-06 (×2): 10 mg via INTRAVENOUS

## 2015-04-06 MED ORDER — GLYCOPYRROLATE 0.2 MG/ML IJ SOLN
INTRAMUSCULAR | Status: DC | PRN
  Administered 2015-04-06: .6 mg via INTRAVENOUS

## 2015-04-06 MED ORDER — PROPOFOL 10 MG/ML IV BOLUS
INTRAVENOUS | Status: DC | PRN
  Administered 2015-04-06: 150 mg via INTRAVENOUS

## 2015-04-06 MED ORDER — SODIUM CHLORIDE 0.9 % IR SOLN
Status: DC | PRN
  Administered 2015-04-06: 500 mL

## 2015-04-06 MED ORDER — KETOROLAC TROMETHAMINE 30 MG/ML IJ SOLN
INTRAMUSCULAR | Status: AC
Start: 2015-04-06 — End: 2015-04-07
  Filled 2015-04-06: qty 1

## 2015-04-06 MED ORDER — BUPIVACAINE LIPOSOME 1.3 % IJ SUSP
20.0000 mL | Freq: Once | INTRAMUSCULAR | Status: DC
  Filled 2015-04-06: qty 20

## 2015-04-06 MED ORDER — BUPIVACAINE LIPOSOME 1.3 % IJ SUSP
INTRAMUSCULAR | Status: DC | PRN
  Administered 2015-04-06: 20 mL

## 2015-04-06 MED ORDER — ONDANSETRON HCL 4 MG/2ML IJ SOLN
INTRAMUSCULAR | Status: DC | PRN
  Administered 2015-04-06 (×2): 4 mg via INTRAVENOUS

## 2015-04-06 MED ORDER — LIDOCAINE HCL 4 % MT SOLN
OROMUCOSAL | Status: DC | PRN
  Administered 2015-04-06: 4 mL via TOPICAL

## 2015-04-06 MED ORDER — MIDAZOLAM HCL 2 MG/2ML IJ SOLN
INTRAMUSCULAR | Status: AC
Start: 2015-04-06 — End: 2015-04-06
  Filled 2015-04-06: qty 4

## 2015-04-06 MED ORDER — FENTANYL CITRATE (PF) 250 MCG/5ML IJ SOLN
INTRAMUSCULAR | Status: AC
  Filled 2015-04-06: qty 5

## 2015-04-06 MED ORDER — KETOROLAC TROMETHAMINE 30 MG/ML IJ SOLN
30.0000 mg | Freq: Four times a day (QID) | INTRAMUSCULAR | Status: AC
  Administered 2015-04-06 – 2015-04-08 (×10): 30 mg via INTRAVENOUS
  Filled 2015-04-06 (×9): qty 1

## 2015-04-06 MED ORDER — LACTATED RINGERS IV SOLN
INTRAVENOUS | Status: DC | PRN
  Administered 2015-04-06 (×2): via INTRAVENOUS

## 2015-04-06 MED ORDER — PROPOFOL 10 MG/ML IV BOLUS
INTRAVENOUS | Status: AC
  Filled 2015-04-06: qty 20

## 2015-04-06 MED ORDER — FOLIC ACID 1 MG PO TABS
1.0000 mg | ORAL_TABLET | Freq: Every day | ORAL | Status: DC
  Administered 2015-04-06 – 2015-04-11 (×7): 1 mg via ORAL
  Filled 2015-04-06 (×7): qty 1

## 2015-04-06 MED ORDER — NEOSTIGMINE METHYLSULFATE 10 MG/10ML IV SOLN
INTRAVENOUS | Status: DC | PRN
  Administered 2015-04-06: 4 mg via INTRAVENOUS

## 2015-04-06 MED ORDER — ADULT MULTIVITAMIN W/MINERALS CH
1.0000 | ORAL_TABLET | Freq: Every day | ORAL | Status: DC
  Administered 2015-04-06 – 2015-04-11 (×6): 1 via ORAL
  Filled 2015-04-06 (×6): qty 1

## 2015-04-06 MED ORDER — LIDOCAINE HCL (CARDIAC) 20 MG/ML IV SOLN
INTRAVENOUS | Status: DC | PRN
  Administered 2015-04-06: 80 mg via INTRAVENOUS

## 2015-04-06 MED ORDER — MEPERIDINE HCL 25 MG/ML IJ SOLN: 6.2500 mg | INTRAMUSCULAR | Status: DC | PRN

## 2015-04-06 MED ORDER — BUPIVACAINE HCL (PF) 0.5 % IJ SOLN
INTRAMUSCULAR | Status: DC | PRN
  Administered 2015-04-06: 10 mL

## 2015-04-06 MED ORDER — LACTATED RINGERS IV SOLN: INTRAVENOUS | Status: DC

## 2015-04-06 MED ORDER — MORPHINE SULFATE (PF) 4 MG/ML IV SOLN
4.0000 mg | INTRAVENOUS | Status: DC | PRN
Start: 2015-04-06 — End: 2015-04-07

## 2015-04-06 MED ORDER — LORAZEPAM 2 MG/ML IJ SOLN
1.0000 mg | Freq: Four times a day (QID) | INTRAMUSCULAR | Status: AC | PRN
  Administered 2015-04-06 – 2015-04-08 (×6): 1 mg via INTRAVENOUS
  Filled 2015-04-06 (×6): qty 1

## 2015-04-06 MED ORDER — CHLORHEXIDINE GLUCONATE CLOTH 2 % EX PADS
6.0000 | MEDICATED_PAD | Freq: Every day | CUTANEOUS | Status: DC
  Administered 2015-04-07 – 2015-04-10 (×4): 6 via TOPICAL

## 2015-04-06 MED ORDER — MIDAZOLAM HCL 5 MG/5ML IJ SOLN
INTRAMUSCULAR | Status: DC | PRN
  Administered 2015-04-06: 2 mg via INTRAVENOUS

## 2015-04-06 MED ORDER — VANCOMYCIN HCL 1000 MG IV SOLR
INTRAVENOUS | Status: DC | PRN
  Administered 2015-04-06: 1000 mg

## 2015-04-06 MED ORDER — FENTANYL CITRATE (PF) 100 MCG/2ML IJ SOLN
INTRAMUSCULAR | Status: DC | PRN
  Administered 2015-04-06: 50 ug via INTRAVENOUS
  Administered 2015-04-06: 100 ug via INTRAVENOUS
  Administered 2015-04-06 (×2): 50 ug via INTRAVENOUS
  Administered 2015-04-06: 25 ug via INTRAVENOUS
  Administered 2015-04-06 (×2): 50 ug via INTRAVENOUS
  Administered 2015-04-06: 25 ug via INTRAVENOUS
  Administered 2015-04-06 (×2): 50 ug via INTRAVENOUS

## 2015-04-06 MED ORDER — MUPIROCIN 2 % EX OINT
1.0000 | TOPICAL_OINTMENT | Freq: Two times a day (BID) | CUTANEOUS | Status: DC
Start: 2015-04-06 — End: 2015-04-11
  Administered 2015-04-06 – 2015-04-10 (×8): 1 via NASAL
  Filled 2015-04-06: qty 22

## 2015-04-06 MED ORDER — LORAZEPAM 1 MG PO TABS
1.0000 mg | ORAL_TABLET | Freq: Four times a day (QID) | ORAL | Status: AC | PRN
  Administered 2015-04-06 – 2015-04-07 (×4): 1 mg via ORAL
  Filled 2015-04-06 (×4): qty 1

## 2015-04-06 SURGICAL SUPPLY — 75 items
ADH SKN CLS APL DERMABOND .7 (GAUZE/BANDAGES/DRESSINGS) ×2
ADH SKN CLS LQ APL DERMABOND (GAUZE/BANDAGES/DRESSINGS) ×1
BAG DECANTER FOR FLEXI CONT (MISCELLANEOUS) ×2 IMPLANT
BLADE CLIPPER SURG (BLADE) IMPLANT
BRUSH SCRUB EZ PLAIN DRY (MISCELLANEOUS) ×2 IMPLANT
BUR ACRON 5.0MM COATED (BURR) ×2 IMPLANT
BUR MATCHSTICK NEURO 3.0 LAGG (BURR) ×2 IMPLANT
CANISTER SUCT 3000ML PPV (MISCELLANEOUS) ×2 IMPLANT
CAP LCK SPNE (Orthopedic Implant) ×4 IMPLANT
CAP LOCK SPINE RADIUS (Orthopedic Implant) IMPLANT
CAP LOCKING (Orthopedic Implant) ×8 IMPLANT
CONT SPEC 4OZ CLIKSEAL STRL BL (MISCELLANEOUS) ×2 IMPLANT
COVER BACK TABLE 24X17X13 BIG (DRAPES) IMPLANT
COVER BACK TABLE 60X90IN (DRAPES) ×2 IMPLANT
DERMABOND ADHESIVE PROPEN (GAUZE/BANDAGES/DRESSINGS) ×1
DERMABOND ADVANCED (GAUZE/BANDAGES/DRESSINGS) ×2
DERMABOND ADVANCED .7 DNX12 (GAUZE/BANDAGES/DRESSINGS) ×2 IMPLANT
DERMABOND ADVANCED .7 DNX6 (GAUZE/BANDAGES/DRESSINGS) IMPLANT
DRAPE C-ARM 42X72 X-RAY (DRAPES) ×4 IMPLANT
DRAPE LAPAROTOMY 100X72X124 (DRAPES) ×2 IMPLANT
DRAPE POUCH INSTRU U-SHP 10X18 (DRAPES) ×2 IMPLANT
DRAPE PROXIMA HALF (DRAPES) IMPLANT
DRSG EMULSION OIL 3X3 NADH (GAUZE/BANDAGES/DRESSINGS) IMPLANT
ELECT REM PT RETURN 9FT ADLT (ELECTROSURGICAL) ×2
ELECTRODE REM PT RTRN 9FT ADLT (ELECTROSURGICAL) ×1 IMPLANT
GAUZE SPONGE 4X4 12PLY STRL (GAUZE/BANDAGES/DRESSINGS) ×2 IMPLANT
GAUZE SPONGE 4X4 16PLY XRAY LF (GAUZE/BANDAGES/DRESSINGS) IMPLANT
GLOVE BIOGEL PI IND STRL 8 (GLOVE) ×2 IMPLANT
GLOVE BIOGEL PI INDICATOR 8 (GLOVE) ×2
GLOVE ECLIPSE 7.5 STRL STRAW (GLOVE) ×4 IMPLANT
GLOVE EXAM NITRILE LRG STRL (GLOVE) IMPLANT
GLOVE EXAM NITRILE MD LF STRL (GLOVE) IMPLANT
GLOVE EXAM NITRILE XL STR (GLOVE) IMPLANT
GLOVE EXAM NITRILE XS STR PU (GLOVE) IMPLANT
GOWN STRL REUS W/ TWL LRG LVL3 (GOWN DISPOSABLE) ×1 IMPLANT
GOWN STRL REUS W/ TWL XL LVL3 (GOWN DISPOSABLE) ×1 IMPLANT
GOWN STRL REUS W/TWL 2XL LVL3 (GOWN DISPOSABLE) IMPLANT
GOWN STRL REUS W/TWL LRG LVL3 (GOWN DISPOSABLE) ×2
GOWN STRL REUS W/TWL XL LVL3 (GOWN DISPOSABLE) ×2
KIT BASIN OR (CUSTOM PROCEDURE TRAY) ×2 IMPLANT
KIT INFUSE SMALL (Orthopedic Implant) ×1 IMPLANT
KIT ROOM TURNOVER OR (KITS) ×2 IMPLANT
MILL MEDIUM DISP (BLADE) ×2 IMPLANT
NDL ASP BONE MRW 8GX15 (NEEDLE) IMPLANT
NDL HYPO 25X1 1.5 SAFETY (NEEDLE) ×1 IMPLANT
NDL SPNL 18GX3.5 QUINCKE PK (NEEDLE) ×1 IMPLANT
NDL SPNL 22GX3.5 QUINCKE BK (NEEDLE) ×1 IMPLANT
NEEDLE ASP BONE MRW 8GX15 (NEEDLE) ×2 IMPLANT
NEEDLE BONE MARROW 8GAX6 (NEEDLE) IMPLANT
NEEDLE HYPO 25X1 1.5 SAFETY (NEEDLE) ×2 IMPLANT
NEEDLE SPNL 18GX3.5 QUINCKE PK (NEEDLE) ×2 IMPLANT
NEEDLE SPNL 22GX3.5 QUINCKE BK (NEEDLE) ×2 IMPLANT
NS IRRIG 1000ML POUR BTL (IV SOLUTION) ×2 IMPLANT
PACK LAMINECTOMY NEURO (CUSTOM PROCEDURE TRAY) ×2 IMPLANT
PAD ARMBOARD 7.5X6 YLW CONV (MISCELLANEOUS) ×6 IMPLANT
PATTIES SURGICAL .5 X.5 (GAUZE/BANDAGES/DRESSINGS) IMPLANT
PATTIES SURGICAL .5 X1 (DISPOSABLE) IMPLANT
PATTIES SURGICAL 1X1 (DISPOSABLE) IMPLANT
PEEK PLIF AVS 13X25X4 (Peek) ×2 IMPLANT
ROD RADIUS 35MM (Rod) ×2 IMPLANT
SCREW 5.75X40M (Screw) ×2 IMPLANT
SCREW 6.75X45MM (Screw) ×2 IMPLANT
SPONGE LAP 4X18 X RAY DECT (DISPOSABLE) IMPLANT
SPONGE NEURO XRAY DETECT 1X3 (DISPOSABLE) IMPLANT
SPONGE SURGIFOAM ABS GEL 100 (HEMOSTASIS) ×2 IMPLANT
STRIP BIOACTIVE VITOSS 25X100X (Neuro Prosthesis/Implant) ×2 IMPLANT
SUT VIC AB 1 CT1 18XBRD ANBCTR (SUTURE) ×2 IMPLANT
SUT VIC AB 1 CT1 8-18 (SUTURE) ×4
SUT VIC AB 2-0 CP2 18 (SUTURE) ×4 IMPLANT
SYR 3ML LL SCALE MARK (SYRINGE) ×8 IMPLANT
SYR CONTROL 10ML LL (SYRINGE) ×2 IMPLANT
TOWEL OR 17X24 6PK STRL BLUE (TOWEL DISPOSABLE) ×2 IMPLANT
TOWEL OR 17X26 10 PK STRL BLUE (TOWEL DISPOSABLE) ×2 IMPLANT
TRAY FOLEY W/METER SILVER 14FR (SET/KITS/TRAYS/PACK) ×2 IMPLANT
WATER STERILE IRR 1000ML POUR (IV SOLUTION) ×2 IMPLANT

## 2015-04-06 NOTE — Progress Notes (Signed)
Foley catheter removed per MD orders.  Patient due to void by midnight.  Will continue to monitor. Sondra Come, RN

## 2015-04-06 NOTE — Anesthesia Preprocedure Evaluation (Addendum)
Anesthesia Evaluation  Patient identified by MRN, date of birth, ID band Patient awake    Reviewed: Allergy & Precautions, NPO status , Patient's Chart, lab work & pertinent test results  Airway Mallampati: II  TM Distance: >3 FB Neck ROM: Limited    Dental  (+) Upper Dentures, Lower Dentures, Dental Advisory Given, Edentulous Upper, Edentulous Lower   Pulmonary Current Smoker,  breath sounds clear to auscultation        Cardiovascular hypertension, Pt. on medications + dysrhythmias Supra Ventricular Tachycardia + Valvular Problems/Murmurs Rhythm:Regular Rate:Normal     Neuro/Psych PSYCHIATRIC DISORDERS Depression Bipolar Disorder  Neuromuscular disease CVA    GI/Hepatic GERD-  Medicated,(+) Hepatitis -, C  Endo/Other  negative endocrine ROS  Renal/GU ARFRenal disease  negative genitourinary   Musculoskeletal  (+) Arthritis -, Osteoarthritis,    Abdominal   Peds negative pediatric ROS (+)  Hematology negative hematology ROS (+)   Anesthesia Other Findings   Reproductive/Obstetrics negative OB ROS                          Lab Results  Component Value Date   WBC 4.0 04/05/2015   HGB 11.2* 04/05/2015   HCT 33.9* 04/05/2015   MCV 104.6* 04/05/2015   PLT 87* 04/05/2015   Lab Results  Component Value Date   CREATININE 0.76 04/05/2015   BUN 14 04/05/2015   NA 138 04/05/2015   K 3.6 04/05/2015   CL 103 04/05/2015   CO2 31 04/05/2015   Lab Results  Component Value Date   INR 1.16 04/04/2015   INR 1.06 03/12/2013   INR 0.97 04/30/2010   EKG: normal sinus rhythm, AV block.  Anesthesia Physical Anesthesia Plan  ASA: III  Anesthesia Plan: General   Post-op Pain Management:    Induction: Intravenous  Airway Management Planned: Oral ETT  Additional Equipment:   Intra-op Plan:   Post-operative Plan: Extubation in OR  Informed Consent: I have reviewed the patients History and  Physical, chart, labs and discussed the procedure including the risks, benefits and alternatives for the proposed anesthesia with the patient or authorized representative who has indicated his/her understanding and acceptance.   Dental advisory given  Plan Discussed with: CRNA  Anesthesia Plan Comments:        Anesthesia Quick Evaluation

## 2015-04-06 NOTE — Progress Notes (Addendum)
Pt arrived to 5C14 via bed from the PACU.  Pt alert and oriented to person, place and situation.  VSS.  Will continue to monitor. Sondra Come, RN

## 2015-04-06 NOTE — Progress Notes (Signed)
Filed Vitals:   04/06/15 1330 04/06/15 1331 04/06/15 1342 04/06/15 1405  BP:  127/77  132/78  Pulse: 100 96  96  Temp:   97.7 F (36.5 C) 100 F (37.8 C)  TempSrc:      Resp: Height:      Weight:      SpO2: 100% 100%  98%    CBC  Recent Labs  04/04/15 0657 04/05/15 0440  WBC 3.6* 4.0  HGB 12.1* 11.2*  HCT 37.6* 33.9*  PLT 95* 87*   BMET  Recent Labs  04/04/15 0657 04/05/15 0440  NA 136 138  K 4.1 3.6  CL 100* 103  CO2 28 31  GLUCOSE 186* 90  BUN 17 14  CREATININE 1.05 0.76  CALCIUM 11.2* 10.6*    Patient doing well following surgery. Fairly comfortable. Ambulated with a staff, but moderately unsteady. Dressing clean and dry. Moving all 4 extremities.  Plan: PT already working with patient. OT consultation requested. We'll continue to progress through postoperative recovery. We'll most likely need either inpatient rehabilitation or extended rehabilitation in a skilled nursing facility. Did speak with the patient's wife and sister-in-law following surgery, and discussed surgical procedure both with them as well as subsequently this evening with the patient.  Hewitt Shorts, MD 04/06/2015, 5:39 PM

## 2015-04-06 NOTE — Anesthesia Procedure Notes (Signed)
Procedure Name: Intubation Date/Time: 04/06/2015 10:31 AM Performed by: Merdis Delay Pre-anesthesia Checklist: Patient identified, Emergency Drugs available, Suction available, Patient being monitored and Timeout performed Patient Re-evaluated:Patient Re-evaluated prior to inductionOxygen Delivery Method: Circle system utilized Preoxygenation: Pre-oxygenation with 100% oxygen Intubation Type: IV induction Ventilation: Mask ventilation without difficulty and Oral airway inserted - appropriate to patient size Laryngoscope Size: Mac and 3 Grade View: Grade I Tube type: Oral Tube size: 7.5 mm Number of attempts: 1 Airway Equipment and Method: Stylet and LTA kit utilized Placement Confirmation: ETT inserted through vocal cords under direct vision,  positive ETCO2,  CO2 detector and breath sounds checked- equal and bilateral Secured at: 22 cm Tube secured with: Tape Dental Injury: Teeth and Oropharynx as per pre-operative assessment

## 2015-04-06 NOTE — Progress Notes (Signed)
PROGRESS NOTE    [name]:[id number] DOB: 1961-07-06 DOA: 04/03/2015 PCP: Arlan Organ., MD    Subjective: Seen with family at bedside, confused.  HPI/Brief narrative 54 year old male with history of multiple back surgeries, ongoing alcohol abuse, tobacco abuse, bipolar disorder on lithium, psoriasis, HTN, hepatitis C, cirrhosis, GERD and chronic back pain states that he's been having worsening low back pain since July 4 but for the last 1 week has noted pain in the back of both his legs extending from the hips down to his feet, difficulty to stand up or ambulate for long due to pain and apparently his wife found him on the floor due to severe pain. He denies difficulty with bowel or bladder. He was seen in the ED on 8/24, MRI L-spine was performed and patient was discharged home on when necessary Valium, Percocet, and 5 days of prednisone and advised to follow-up with neurosurgery. Now admitted for severe low back pain. Neurosurgery consulted - recommended surgical intervention in am. [facility].    Assessment/Plan:  Acute on chronic low back pain with sciatica - Reviewed MRI L-spine report from 8/24-results as below. 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 - Consulted patient's primary neurosurgeon Dr. Newell Coral  - Post surgical repair, control pain with narcotics  Alcohol abuse/hepatitis C/cirrhosis - Patient continues to drink up to 4 beers daily and vodka occasionally. - CIWA protocol - INR is 1.1  Hypercalcemia - May be related to dehydration. Brief IV normal saline hydration and follow. Calcium level improved to 10.6, but still slightly high.   Pancytopenia/macrocytosis - Likely related to ongoing alcohol abuse related bone marrow toxicity and cirrhosis. - Follow CBCs. Alcohol abstinence. - platelet transfusion as needed to keep ti  greater than 1,00,000.   Tobacco abuse - Cessation counseled. Nicotine patch.  Essential hypertension - Controlled off medications.  Bipolar disorder - Patient on lithium at home-continue. Lithium levels low limits of normal.  Psoriasis - Patient has extensive skin psoriatic lesions on his back, limbs. Outpatient follow-up.  Mildly elevated ammonia - Patient appears coherent. No clear encephalopathy.    DVT prophylaxis: DC subcutaneous heparin and place on SCDs. Code Status: Full. Family Communication: non at bedside. . Disposition Plan: Transferred to [facility].    Consultants:  Neurosurgery/Dr. Newell Coral  Procedures:  None  Antibiotics:  None    Objective: Filed Vitals:   04/06/15 1330 04/06/15 1331 04/06/15 1342 04/06/15 1405  BP:  127/77  132/78  Pulse: 100 96  96  Temp:   97.7 F (36.5 C) 100 F (37.8 C)  TempSrc:      Resp: Height:      Weight:      SpO2: 100% 100%  98%    Intake/Output Summary (Last 24 hours) at 04/06/15 1409 Last data filed at 04/06/15 1343  Gross per 24 hour  Intake   2250 ml  Output   1305 ml  Net    945 ml   Filed Weights   04/04/15 0800  Weight: 70 kg (154 lb 5.2 oz)     Exam:  General exam: Moderately built and thinly nourished pleasant young male sitting up comfortably in bed. Respiratory system: Clear. No increased work of breathing. Cardiovascular system: S1 & S2 heard, RRR. No JVD, murmurs, gallops, clicks or pedal edema. Gastrointestinal system: Abdomen is  nondistended, soft and nontender. Normal bowel sounds heard. Central nervous system: Alert and oriented. No focal neurological deficits. Extremities: Symmetric 5 x 5 power in upper extremities and grade 4 x 5 power in lower extremities. Skin: Extensive psoriatic lesions on back and all limbs.   Data Reviewed: Basic Metabolic Panel:  Recent Labs Lab 04/03/15 2355 04/04/15 0657 04/05/15 0440  NA 137 136 138  K 3.8 4.1 3.6  CL 100* 100* 103    CO2 28 28 31   GLUCOSE 99 186* 90  BUN 12 17 14   CREATININE 1.01 1.05 0.76  CALCIUM 11.9* 11.2* 10.6*   Liver Function Tests:  Recent Labs Lab 04/03/15 2355 04/05/15 0440  AST 112* 68*  ALT 121* 83*  ALKPHOS 106 82  BILITOT 1.0 1.2  PROT 6.5 5.8*  ALBUMIN 3.6 3.2*   No results for input(s): LIPASE, AMYLASE in the last 168 hours.  Recent Labs Lab 04/03/15 2355  AMMONIA 49*   CBC:  Recent Labs Lab 04/03/15 2355 04/04/15 0657 04/05/15 0440  WBC 5.4 3.6* 4.0  NEUTROABS 3.2  --   --   HGB 13.3 12.1* 11.2*  HCT 41.1 37.6* 33.9*  MCV 105.1* 106.5* 104.6*  PLT 106* 95* 87*   Cardiac Enzymes:  Recent Labs Lab 04/03/15 2355  CKTOTAL 29*   BNP (last 3 results) No results for input(s): PROBNP in the last 8760 hours. CBG: No results for input(s): GLUCAP in the last 168 hours.  Recent Results (from the past 240 hour(s))  Surgical pcr screen     Status: Abnormal   Collection Time: 04/05/15 11:19 PM  Result Value Ref Range Status   MRSA, PCR NEGATIVE NEGATIVE Final   Staphylococcus aureus POSITIVE (A) NEGATIVE Final    Comment:        The Xpert SA Assay (FDA approved for NASAL specimens in patients over 54 years of age), is one component of a comprehensive surveillance program.  Test performance has been validated by [facility] for patients greater than or equal to 24 year old. It is not intended to diagnose infection nor to guide or monitor treatment.         Studies: Dg Lumbar Spine 2-3 Views  04/06/2015   CLINICAL DATA:  Intraoperative fluoroscopy for posterior lumbar interbody fusion at L4-5 with hardware removal.  EXAM: LUMBAR SPINE - 2-3 VIEW; DG C-ARM 61-120 MIN  COMPARISON:  04/06/2015 portable cross-table intraoperative lumbar spine radiograph.  FINDINGS: Two nondiagnostic intraoperative spot fluoroscopic images of the lumbar spine were provided. Fluoroscopy time 27 seconds. Bone cage markers are present in the L4-5 disc space. Bilateral  posterior approach pedicle screws are noted at L4 and L5.  IMPRESSION: Intraoperative fluoroscopy for posterior lumbar interbody fusion at L4-5.   Electronically Signed   By: Delbert Phenix M.D.   On: 04/06/2015 12:53   Dg Lumbar Spine 1 View  04/06/2015   CLINICAL DATA:  Posterior fusion  EXAM: LUMBAR SPINE - 1 VIEW  COMPARISON:  MR lumbar spine of 03/29/2015  FINDINGS: A cross-table lateral portable view of the lumbar spine from the operating room shows hardware for fusion at L4-5. Alignment of the lumbar vertebrae is normal. A needle is positioned beneath the spinous process of L5 directed toward the L5-S1 interspace.  IMPRESSION: Needle directed to L5-S1 interspace. Hardware is present for posterior fusion at L4-5.   Electronically Signed   By: Dwyane Dee M.D.   On: 04/06/2015 12:18   Dg C-arm 1-60 Min  04/06/2015   CLINICAL  DATA:  Intraoperative fluoroscopy for posterior lumbar interbody fusion at L4-5 with hardware removal.  EXAM: LUMBAR SPINE - 2-3 VIEW; DG C-ARM 61-120 MIN  COMPARISON:  04/06/2015 portable cross-table intraoperative lumbar spine radiograph.  FINDINGS: Two nondiagnostic intraoperative spot fluoroscopic images of the lumbar spine were provided. Fluoroscopy time 27 seconds. Bone cage markers are present in the L4-5 disc space. Bilateral posterior approach pedicle screws are noted at L4 and L5.  IMPRESSION: Intraoperative fluoroscopy for posterior lumbar interbody fusion at L4-5.   Electronically Signed   By: Delbert Phenix M.D.   On: 04/06/2015 12:53        Scheduled Meds: . aspirin  81 mg Oral Daily  . Chlorhexidine Gluconate Cloth  6 each Topical Daily  . clobetasol  1 application Topical Q7 days  . cloNIDine  0.3 mg Oral BID  . ketorolac  30 mg Intravenous 4 times per day  . ketorolac      . lithium carbonate  900 mg Oral QHS  . mupirocin ointment  1 application Nasal BID  . nicotine  21 mg Transdermal Daily  . pantoprazole  40 mg Oral Daily  . traZODone  200 mg Oral QHS    . vancomycin       Continuous Infusions:   Principal Problem:   Back pain Active Problems:   Bipolar disorder   Bipolar 1 disorder, depressed, moderate   Lumbago   Tobacco abuse   Uncomplicated alcohol dependence   Other pancytopenia    Time spent: 25 minutes.    [facility] A, MD,[facility] Pager [phone]  If 7PM-7AM, please contact night-coverage [email or web address] Password [email or web address] 04/06/2015, 2:09 PM

## 2015-04-06 NOTE — Transfer of Care (Signed)
Immediate Anesthesia Transfer of Care Note  Patient: [name]  Procedure(s) Performed: Procedure(s) with comments: POSTERIOR LUMBAR FUSION 1 WITH HARDWARE REMOVAL (N/A) - L45 decompression with posterior lumbar interbody fusion interbody prosthesis posterior lateral arthrodesis and posterior nonsegmental instrumentation  Patient Location: PACU  Anesthesia Type:General  Level of Consciousness: awake and alert   Airway & Oxygen Therapy: Patient Spontanous Breathing and Patient connected to nasal cannula oxygen  Post-op Assessment: Report given to RN and Post -op Vital signs reviewed and stable  Post vital signs: Reviewed  Last Vitals:  Filed Vitals:   04/06/15 0642  BP: 132/80  Pulse: 94  Temp: 36.8 C  Resp: 20    Complications: No apparent anesthesia complications   Pt denies pain. Moving all extremities to command. Comfortable.

## 2015-04-06 NOTE — Anesthesia Postprocedure Evaluation (Signed)
  Anesthesia Post-op Note  Patient: [name]  Procedure(s) Performed: Procedure(s) with comments: POSTERIOR LUMBAR FUSION 1 WITH HARDWARE REMOVAL (N/A) - L45 decompression with posterior lumbar interbody fusion interbody prosthesis posterior lateral arthrodesis and posterior nonsegmental instrumentation  Patient Location: PACU  Anesthesia Type:General  Level of Consciousness: awake and alert   Airway and Oxygen Therapy: Patient Spontanous Breathing  Post-op Pain: mild  Post-op Assessment: Post-op Vital signs reviewed and Patient's Cardiovascular Status Stable LLE Motor Response: Purposeful movement LLE Sensation: Full sensation RLE Motor Response: Purposeful movement RLE Sensation: Full sensation, Pain      Post-op Vital Signs: Reviewed and stable  Last Vitals:  Filed Vitals:   04/06/15 1342  BP:   Pulse:   Temp: 36.5 C  Resp:     Complications: No apparent anesthesia complications

## 2015-04-06 NOTE — Op Note (Signed)
04/03/2015 - 04/06/2015  12:49 PM  PATIENT:  [name]  54 y.o. male  PRE-OPERATIVE DIAGNOSIS:  L4-5 lumbar stenosis with neurogenic claudication, lumbar degenerative disc disease, lumbar spondylosis  POST-OPERATIVE DIAGNOSIS:  L4-5 lumbar stenosis with neurogenic claudication, lumbar degenerative disc disease, lumbar spondylosis  PROCEDURE:  Procedure(s):  Explantation of ISOLA instrumentation at L5-6;  L4 and L5 decompressive lumbar laminectomy, bilateral L4-5 facetectomy, foraminotomies for decompression of stenotic compression of the exiting L4 and L5 nerve roots bilaterally, with decompression beyond that required for interbody arthrodesis; L4-5 posterior lumbar interbody arthrodesis with AVS peek interbody implants, Vitoss BA with bone marrow aspirate, and infuse; bilateral L4-5 posterior lateral arthrodesis with nonsegmental radius posterior instrumentation, locally harvested morcellized autograft, Vitoss BA with bone marrow aspirate, and infuse  SURGEON:  Surgeon(s): Shirlean Kelly, MD Maeola Harman, MD  ASSISTANTS: Maeola Harman, M.D.  ANESTHESIA:   general  EBL:  Total I/O In: 2150 [I.V.:2150] Out: 1075 [Urine:750; Blood:325]  BLOOD ADMINISTERED:none  CELL SAVER GIVEN: Cell Saver technician felt that there was insufficient blood loss or process to collect the blood.  COUNT: Correct per nursing staff  DICTATION: Patient is brought to the operating room placed under general endotracheal anesthesia. The patient was turned to prone position the lumbar region was prepped with Betadine soap and solution and draped in a sterile fashion. The midline was infiltrated with local anesthesia with epinephrine. A localizing x-ray was taken and then a midline incision was made carried down through the subcutaneous tissue, bipolar cautery and electrocautery were used to maintain hemostasis. Dissection was carried down to the lumbar fascia. The fascia was incised bilaterally and the  paraspinal muscles were dissected with a spinous process and lamina in a subperiosteal fashion.  Dissection was then carried out laterally and the existing posterior instrumentation at the L5-6 level was exposed. The locking screws were removed, and then the screw caps were slid off the screw heads and removed, the rods were then removed, and then finally the 4 screws were removed. Each screw hole was filled with Surgifoam and Gelfoam to establish hemostasis.  Dissection was then carried out laterally over the facet complex and the transverse processes of L4 and L5 were exposed and decorticated. Decompression was begun with a L4 and L5 laminectomy using the high-speed drill and Kerrison punches. Dissection was carried out laterally including facetectomy and foraminotomies with decompression of the stenotic compression of the exiting L4 and L5 nerve roots. Once the decompression stenotic compression of the thecal sac and exiting nerve roots was completed we proceeded with the posterior lumbar interbody arthrodesis. The annulus was incised bilaterally and the disc space entered. A thorough discectomy was performed using pituitary rongeurs and curettes. Once the discectomy was completed we began to prepare the endplate surfaces removing the cartilaginous endplates surface. We then measured the height of the intervertebral disc space. We selected 13 x 25 x 4 AVS peek interbody implants.  The C-arm fluoroscope was then draped and brought in the field and we identified the pedicle entry points bilaterally at the L4 level. Each of the pedicles was probed, we aspirated bone marrow aspirate from the vertebral bodies, this was injected over two 10 cc strips of Vitoss BA. Then each of the pedicles was examined with the ball probe good bony surfaces were found and no bony cuts were found. Each of the pedicles was then tapped with a 5.25 mm tap, again examined with the ball probe good threading was found and no bony cuts  were  found. We then placed 5.75 by 40 millimeter screws bilaterally at the L4 level.  We used the existing screw holes at L5, and placed 6.75 x 45 mm screws bilaterally.  We then packed the AVS peek interbody implants with Vitoss BA with bone marrow aspirate and infuse, and then placed the first implant and on the right side, carefully retracting the thecal sac and nerve root medially. We then went back to the left side and packed the midline with additional Vitoss BA with bone marrow aspirate and infuse, and then placed a second implant and on the left side again retracting the thecal sac and nerve root medially. Additional Vitoss BA with bone marrow aspirate and infuse was packed lateral to the implants.  We then packed the lateral gutter over the transverse processes and intertransverse space with locally harvested morcellized autograft, Vitoss BA with bone marrow aspirate, and infuse. We then selected a 35 mm pre-lordosed rods, they were placed within the screw heads and secured with locking caps once all 4 locking caps were placed final tightening was performed against a counter torque.  The wound had been irrigated multiple times during the procedure with saline solution and bacitracin solution, good hemostasis was established with a combination of bipolar cautery and Gelfoam with thrombin. Once good hemostasis was confirmed we proceeded with closure. Prior to closure vancomycin powder was applied to the tissue surfaces. The paraspinal muscles and deep fascia were closed in separate layers, with interrupted undyed 1 Vicryl sutures.  We then injected Exparel into this paraspinal muscles. We then closed the subcutaneous and subcuticular closed with interrupted inverted 2-0 undyed Vicryl sutures the skin edges were approximated with Dermabond.  The wound was dressed with sterile gauze and Hypafix.  Following surgery the patient was turned back to the supine position to be reversed and the anesthetic  extubated and transferred to the recovery room for further care.   PLAN OF CARE: Patient is to be managed in the recovery room, and then to return to the neurosurgical floor.  PATIENT DISPOSITION:  PACU - hemodynamically stable.   Delay start of Pharmacological VTE agent (>24hrs) due to surgical blood loss or risk of bleeding:  yes

## 2015-04-06 NOTE — Progress Notes (Signed)
Patient refused CPAP.

## 2015-04-06 NOTE — Progress Notes (Signed)
PT Cancellation Note  Patient Details Name: [name] MRN: [id number] DOB: 1960-09-11   Cancelled Treatment:    Reason Eval/Treat Not Completed: Medical issues which prohibited therapy; patient just back from surgery and awaiting brace.  Will attempt to see tomorrow.   Debbie Bellucci,CYNDI 04/06/2015, 3:43 PM  Sheran Lawless, PT [phone] 04/06/2015

## 2015-04-06 NOTE — Progress Notes (Signed)
Physical Therapy Treatment/Re-evaluation Patient Details Name: [name] MRN: [id number] DOB: [birth year] Today's Date: 04/06/2015    History of Present Illness 54 yo male admitted with back pain. Hx of peripheral neuropathy, tachycardia, bipolar, avascular necrosis, Hep C, L4-L5 fusion, L3-L4 spinal stenosis; now s/p L4-5 laminectomy and PLIF.    PT Comments    Patient presents s/p above procedure today and seen due to attempting OOB anyway on his own and brace delivered.  Still with significant limitations in independence and safety now with complicating factor of back precautions he is currently unable to adhere to due to confusion.  Feel continued skilled PT in the acute setting may assist with improving safety and independence with mobility prior to d/c to SNF level rehab for continued skilled care prior to d/c home.  Follow Up Recommendations  SNF;Supervision/Assistance - 24 hour     Equipment Recommendations  None recommended by PT    Recommendations for Other Services       Precautions / Restrictions Precautions Precautions: Fall;Back Required Braces or Orthoses: Spinal Brace Spinal Brace: Lumbar corset;Applied in sitting position    Mobility  Bed Mobility Overal bed mobility: Needs Assistance Bed Mobility: Rolling;Sidelying to Sit;Sit to Sidelying Rolling: Mod assist Sidelying to sit: Mod assist     Sit to sidelying: Max assist General bed mobility comments: max instructional cues repeated and assist for precautions/technique  Transfers Overall transfer level: Needs assistance Equipment used: Rolling walker (2 wheeled) Transfers: Sit to/from Stand Sit to Stand: Min assist         General transfer comment: increased time to come upright and pulls up on walker despite cues; stood couple of times for change of bed linen  Ambulation/Gait Ambulation/Gait assistance: Min assist Ambulation Distance (Feet): 18 Feet Assistive device: Rolling walker (2  wheeled) Gait Pattern/deviations: Step-to pattern;Decreased stride length;Trunk flexed;Wide base of support;Shuffle     General Gait Details: cues for proximity to walker, for slowing down due to IV, tight quarters in the room; made it to doorway then IV in right hand out and bleeding, backwards stepping with cues and assist with walker to bed and RN informed to come assist.     Stairs            Wheelchair Mobility    Modified Rankin (Stroke Patients Only)       Balance Overall balance assessment: Needs assistance         Standing balance support: Bilateral upper extremity supported Standing balance-Leahy Scale: Poor Standing balance comment: needs UE support for balance especially with disorientation and poor overall hygiene, IV right hand out and left arm leaking and pt attempting to get up to pee; RN assisted to settle patient and planning to call MD for foley out.                    Cognition Arousal/Alertness: Awake/alert Behavior During Therapy: Agitated Overall Cognitive Status: Impaired/Different from baseline Area of Impairment: Following commands;Safety/judgement;Problem solving Orientation Level: Place;Time;Situation;Disoriented to Current Attention Level: Focused   Following Commands: Follows one step commands inconsistently Safety/Judgement: Decreased awareness of safety;Decreased awareness of deficits   Problem Solving: Slow processing;Difficulty sequencing;Requires verbal cues;Requires tactile cues General Comments: Needing constant reminders not to bend over, that he has a catheter and doesn't need to get up to pee and that he has had back surgery    Exercises      General Comments General comments (skin integrity, edema, etc.): bruising and excoriation around medial thighs and patches similar  to psoriasis over extremities.      Pertinent Vitals/Pain Pain Assessment: Faces Faces Pain Scale: Hurts whole lot Pain Location: when bending  forward despie max cues for precautions Pain Intervention(s): Monitored during session;Repositioned    Home Living                      Prior Function            PT Goals (current goals can now be found in the care plan section) Progress towards PT goals: Not progressing toward goals - comment (limited progress due to confusion postop)    Frequency  Min 5X/week    PT Plan Current plan remains appropriate    Co-evaluation             End of Session Equipment Utilized During Treatment: Back brace Activity Tolerance: Treatment limited secondary to medical complications (Comment) Patient left: in bed;with call bell/phone within reach;with bed alarm set     Time: 1610-9604 PT Time Calculation (min) (ACUTE ONLY): 24 min  Charges:  $Gait Training: 8-22 mins                    G Codes:      WYNN,CYNDI 05-02-2015, 5:21 PM  Sheran Lawless, PT [phone] May 02, 2015

## 2015-04-06 NOTE — Progress Notes (Signed)
Orthopedic Tech Progress Note Patient Details:  [name] 11-04-1960 [id number] Called brace order in to [employer]. Patient ID: [name], male   DOB: [birth year], 54 y.o.   MRN: [id number]   Lesle Chris 04/06/2015, 2:39 PM

## 2015-04-07 ENCOUNTER — Observation Stay (HOSPITAL_COMMUNITY): Payer: Medicare Other

## 2015-04-07 ENCOUNTER — Encounter (HOSPITAL_COMMUNITY): Payer: Self-pay | Admitting: Neurosurgery

## 2015-04-07 DIAGNOSIS — Z720 Tobacco use: Secondary | ICD-10-CM | POA: Diagnosis not present

## 2015-04-07 DIAGNOSIS — F3132 Bipolar disorder, current episode depressed, moderate: Secondary | ICD-10-CM | POA: Diagnosis not present

## 2015-04-07 DIAGNOSIS — F1020 Alcohol dependence, uncomplicated: Secondary | ICD-10-CM | POA: Diagnosis not present

## 2015-04-07 DIAGNOSIS — M5489 Other dorsalgia: Secondary | ICD-10-CM | POA: Diagnosis not present

## 2015-04-07 LAB — URINALYSIS, ROUTINE W REFLEX MICROSCOPIC
Bilirubin Urine: NEGATIVE
GLUCOSE, UA: NEGATIVE mg/dL
Ketones, ur: NEGATIVE mg/dL
LEUKOCYTES UA: NEGATIVE
Nitrite: NEGATIVE
PROTEIN: NEGATIVE mg/dL
SPECIFIC GRAVITY, URINE: 1.016 (ref 1.005–1.030)
Urobilinogen, UA: 1 mg/dL (ref 0.0–1.0)
pH: 8 (ref 5.0–8.0)

## 2015-04-07 LAB — AMMONIA: AMMONIA: 28 umol/L (ref 9–35)

## 2015-04-07 LAB — COMPREHENSIVE METABOLIC PANEL
ALBUMIN: 2.6 g/dL — AB (ref 3.5–5.0)
ALK PHOS: 64 U/L (ref 38–126)
ALT: 59 U/L (ref 17–63)
ANION GAP: 3 — AB (ref 5–15)
AST: 60 U/L — ABNORMAL HIGH (ref 15–41)
BUN: 14 mg/dL (ref 6–20)
CHLORIDE: 102 mmol/L (ref 101–111)
CO2: 31 mmol/L (ref 22–32)
Calcium: 9.8 mg/dL (ref 8.9–10.3)
Creatinine, Ser: 0.94 mg/dL (ref 0.61–1.24)
GFR calc non Af Amer: >60 mL/min (ref >60)
GLUCOSE: 108 mg/dL — AB (ref 65–99)
POTASSIUM: 4.5 mmol/L (ref 3.5–5.1)
SODIUM: 136 mmol/L (ref 135–145)
Total Bilirubin: 1.1 mg/dL (ref 0.3–1.2)
Total Protein: 5.1 g/dL — ABNORMAL LOW (ref 6.5–8.1)

## 2015-04-07 LAB — CBC
HEMATOCRIT: 25.3 % — AB (ref 39.0–52.0)
HEMOGLOBIN: 8.2 g/dL — AB (ref 13.0–17.0)
MCH: 34 pg (ref 26.0–34.0)
MCHC: 32.4 g/dL (ref 30.0–36.0)
MCV: 105 fL — ABNORMAL HIGH (ref 78.0–100.0)
Platelets: 97 10*3/uL — ABNORMAL LOW (ref 150–400)
RBC: 2.41 MIL/uL — AB (ref 4.22–5.81)
RDW: 13 % (ref 11.5–15.5)
WBC: 5.2 10*3/uL (ref 4.0–10.5)

## 2015-04-07 LAB — URINE MICROSCOPIC-ADD ON

## 2015-04-07 LAB — FOLATE: Folate: 9.9 ng/mL (ref >5.9)

## 2015-04-07 LAB — VITAMIN B12: Vitamin B-12: 305 pg/mL (ref 180–914)

## 2015-04-07 MED ORDER — METOPROLOL TARTRATE 25 MG PO TABS
25.0000 mg | ORAL_TABLET | Freq: Two times a day (BID) | ORAL | Status: DC
  Administered 2015-04-07 – 2015-04-11 (×8): 25 mg via ORAL
  Filled 2015-04-07 (×10): qty 1

## 2015-04-07 MED ORDER — MORPHINE SULFATE (PF) 2 MG/ML IV SOLN: 2.0000 mg | INTRAVENOUS | Status: DC | PRN

## 2015-04-07 NOTE — Progress Notes (Signed)
Patient very restless and would not stay in bed. He gets very agitated . He will also attempt to get up without his brace as MD ordered confused at times but easily to calm down Prn's given as ordered dressing  on lower back intact, BED ALARM ON and bed in low postion

## 2015-04-07 NOTE — Clinical Social Work Note (Signed)
CSW spoke with the pt's wife [name]. [name] confirmed that she has accepted the bed offer at [facility]. CSW will assist the family with discharge plan.   Shelsie Tijerino, MSW, LCSWA [phone]

## 2015-04-07 NOTE — Progress Notes (Deleted)
Above note on wrong chart  thanks

## 2015-04-07 NOTE — Progress Notes (Signed)
Subjective: Patient resting in bed, comfortable. He denies any back pain, and feels the legs feel much better than prior to surgery. Nursing staff reports that he has had some confusion and restlessness.   Objective: Vital signs in last 24 hours: Filed Vitals:   04/06/15 1807 04/06/15 2100 04/07/15 0134 04/07/15 0509  BP: 146/89 136/78 119/69 139/86  Pulse: 116 95 118 119  Temp: 98.6 F (37 C) 98.6 F (37 C) 100.6 F (38.1 C) 100 F (37.8 C)  TempSrc: Oral Oral Oral Oral  Resp: 20 18 18 20   Height:      Weight:      SpO2: 99% 98% 95% 97%    Intake/Output from previous day: 09/01 0701 - 09/02 0700 In: 2250 [I.V.:2250] Out: 1105 [Urine:780; Blood:325] Intake/Output this shift:    Physical Exam:  Moving all 4 extremities well. Dressing clean and dry, but half peeled up, nursing staff to replace.  CBC  Recent Labs  04/05/15 0440 04/07/15 0359  WBC 4.0 5.2  HGB 11.2* 8.2*  HCT 33.9* 25.3*  PLT 87* 97*   BMET  Recent Labs  04/05/15 0440 04/07/15 0359  NA 138 136  K 3.6 4.5  CL 103 102  CO2 31 31  GLUCOSE 90 108*  BUN 14 14  CREATININE 0.76 0.94  CALCIUM 10.6* 9.8    Studies/Results: Dg Lumbar Spine 2-3 Views  04/06/2015   CLINICAL DATA:  Intraoperative fluoroscopy for posterior lumbar interbody fusion at L4-5 with hardware removal.  EXAM: LUMBAR SPINE - 2-3 VIEW; DG C-ARM 61-120 MIN  COMPARISON:  04/06/2015 portable cross-table intraoperative lumbar spine radiograph.  FINDINGS: Two nondiagnostic intraoperative spot fluoroscopic images of the lumbar spine were provided. Fluoroscopy time 27 seconds. Bone cage markers are present in the L4-5 disc space. Bilateral posterior approach pedicle screws are noted at L4 and L5.  IMPRESSION: Intraoperative fluoroscopy for posterior lumbar interbody fusion at L4-5.   Electronically Signed   By: Delbert Phenix M.D.   On: 04/06/2015 12:53   Dg Lumbar Spine 1 View  04/06/2015   CLINICAL DATA:  Posterior fusion  EXAM: LUMBAR SPINE  - 1 VIEW  COMPARISON:  MR lumbar spine of 03/29/2015  FINDINGS: A cross-table lateral portable view of the lumbar spine from the operating room shows hardware for fusion at L4-5. Alignment of the lumbar vertebrae is normal. A needle is positioned beneath the spinous process of L5 directed toward the L5-S1 interspace.  IMPRESSION: Needle directed to L5-S1 interspace. Hardware is present for posterior fusion at L4-5.   Electronically Signed   By: Dwyane Dee M.D.   On: 04/06/2015 12:18   Dg C-arm 1-60 Min  04/06/2015   CLINICAL DATA:  Intraoperative fluoroscopy for posterior lumbar interbody fusion at L4-5 with hardware removal.  EXAM: LUMBAR SPINE - 2-3 VIEW; DG C-ARM 61-120 MIN  COMPARISON:  04/06/2015 portable cross-table intraoperative lumbar spine radiograph.  FINDINGS: Two nondiagnostic intraoperative spot fluoroscopic images of the lumbar spine were provided. Fluoroscopy time 27 seconds. Bone cage markers are present in the L4-5 disc space. Bilateral posterior approach pedicle screws are noted at L4 and L5.  IMPRESSION: Intraoperative fluoroscopy for posterior lumbar interbody fusion at L4-5.   Electronically Signed   By: Delbert Phenix M.D.   On: 04/06/2015 12:53    Assessment/Plan: Continue PT, initiated OT.  Have explained to patient and nursing staff that he is to don and doff his bed standing at the bedside.  I would like him ambulating in the halls with  the staff at least 4 times a day, and sitting up in a chair for meals. He has significant deconditioning and will need rehabilitation, either inpatient here at [facility] or at a SNF.   Hewitt Shorts, MD 04/07/2015, 8:51 AM

## 2015-04-07 NOTE — Progress Notes (Signed)
Physical Therapy Treatment Patient Details Name: [name] MRN: [id number] DOB: March 28, 1961 Today's Date: 04/07/2015    History of Present Illness 54 yo male admitted with back pain. Hx of peripheral neuropathy, tachycardia, bipolar, avascular necrosis, Hep C, L4-L5 fusion, L3-L4 spinal stenosis; now s/p L4-5 laminectomy and PLIF.    PT Comments    Patient not progressing with ambulation this session due to severe confusion, limited participation and decreased safety despite two helpers.  Hopefully will clear cognitively and participate better next session.  Follow Up Recommendations  SNF;Supervision/Assistance - 24 hour     Equipment Recommendations  None recommended by PT    Recommendations for Other Services       Precautions / Restrictions Precautions Precautions: Fall;Back Required Braces or Orthoses: Spinal Brace Spinal Brace: Lumbar corset;Applied in sitting position Restrictions Weight Bearing Restrictions: No    Mobility  Bed Mobility Overal bed mobility: Needs Assistance Bed Mobility: Sit to Supine Rolling: Max assist Sidelying to sit: Mod assist     Sit to sidelying: Min assist General bed mobility comments: increased time, max cues and assist for rolling and side to sit; allowed pt to return to supine with self selected technique for safety due to difficulty following commands  Transfers Overall transfer level: Needs assistance Equipment used: Rolling walker (2 wheeled);2 person hand held assist Transfers: Sit to/from Stand Sit to Stand: Mod assist;+2 physical assistance         General transfer comment: pt resistant initially to standing, attempted x 2 to encourage participation in ambulation, but patient kept sitting back on bed; so allowed pt to return to supine  Ambulation/Gait                 Stairs            Wheelchair Mobility    Modified Rankin (Stroke Patients Only)       Balance Overall balance assessment: Needs  assistance         Standing balance support: Bilateral upper extremity supported Standing balance-Leahy Scale: Poor                      Cognition Arousal/Alertness: Awake/alert Behavior During Therapy: Agitated Overall Cognitive Status: Impaired/Different from baseline   Orientation Level: Place;Time;Situation;Disoriented to Current Attention Level: Focused   Following Commands: Follows one step commands inconsistently Safety/Judgement: Decreased awareness of safety;Decreased awareness of deficits   Problem Solving: Slow processing;Difficulty sequencing;Requires verbal cues;Requires tactile cues General Comments: patient mumbling mostly incoherently; occasional clear answer or response given.  Multiple cues for safety with not removing IV, clothing, socks and for not bending over    Exercises      General Comments        Pertinent Vitals/Pain Faces Pain Scale: Hurts little more Pain Location: back with mobilizing Pain Descriptors / Indicators: Grimacing;Discomfort Pain Intervention(s): Monitored during session;Relaxation;Repositioned    Home Living                      Prior Function            PT Goals (current goals can now be found in the care plan section) Progress towards PT goals: Not progressing toward goals - comment (due to continued confusion)    Frequency  Min 5X/week    PT Plan Current plan remains appropriate    Co-evaluation             End of Session Equipment Utilized During Treatment: Back brace Activity Tolerance:  Treatment limited secondary to agitation Patient left: in bed;with call bell/phone within reach;with bed alarm set     Time: 9604-5409 PT Time Calculation (min) (ACUTE ONLY): 25 min  Charges:  $Therapeutic Activity: 23-37 mins                    G Codes:      WYNN,CYNDI 05/02/2015, 11:05 AM  Sheran Lawless, PT [phone] May 02, 2015

## 2015-04-07 NOTE — Plan of Care (Signed)
Problem: Consults Goal: Diagnosis - Spinal Surgery Lumbar Laminectomy (Complex) L4-L5

## 2015-04-07 NOTE — Progress Notes (Signed)
Patients family has called frequently requiring about patients care and wife at bedside today

## 2015-04-07 NOTE — Progress Notes (Signed)
PROGRESS NOTE    [name] ZOX:[id number] DOB: [birth year] DOA: 04/03/2015 PCP: Arlan Organ., MD    Subjective: Patient is still confused, sees that he is going through alcohol withdrawal. He also has some tachycardia and fever could be explained by alcohol withdrawal versus infections, and cultures obtained.  HPI/Brief narrative 54 year old male with history of multiple back surgeries, ongoing alcohol abuse, tobacco abuse, bipolar disorder on lithium, psoriasis, HTN, hepatitis C, cirrhosis, GERD and chronic back pain states that he's been having worsening low back pain since July 4 but for the last 1 week has noted pain in the back of both his legs extending from the hips down to his feet, difficulty to stand up or ambulate for long due to pain and apparently his wife found him on the floor due to severe pain. He denies difficulty with bowel or bladder. He was seen in the ED on 8/24, MRI L-spine was performed and patient was discharged home on when necessary Valium, Percocet, and 5 days of prednisone and advised to follow-up with neurosurgery. Now admitted for severe low back pain. Neurosurgery consulted - recommended surgical intervention in am. [facility].    Assessment/Plan:  Acute on chronic low back pain with sciatica - Reviewed MRI L-spine report from 8/24-results as below. 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 - Seen by Dr. Newell Coral of neurosurgery, surgery is done 04/06/2015 - Continue to control pain with narcotics.  Alcohol abuse/hepatitis C/cirrhosis - Patient continues to drink up to 4 beers daily and vodka occasionally. - CIWA protocol - INR is 1.1  Hypercalcemia - May be related to dehydration. Brief IV normal saline hydration and follow. Calcium level improved to 10.6, but still slightly high.   Pancytopenia/macrocytosis -  Likely related to ongoing alcohol abuse related bone marrow toxicity and cirrhosis. - Follow CBCs. Alcohol abstinence. - platelet transfusion as needed to keep ti greater than 1,00,000.   Tobacco abuse - Cessation counseled. Nicotine patch.  Essential hypertension - Controlled off medications.  Bipolar disorder - Patient on lithium at home-continue. Lithium levels low limits of normal.  Psoriasis - Patient has extensive skin psoriatic lesions on his back, limbs. Outpatient follow-up.  Mildly elevated ammonia - Patient appears coherent. No clear encephalopathy.    DVT prophylaxis: DC subcutaneous heparin and place on SCDs. Code Status: Full. Family Communication: non at bedside. . Disposition Plan: SNF at some point after the confusion improves.   Consultants:  Neurosurgery/Dr. Newell Coral  Procedures:  None  Antibiotics:  None    Objective: Filed Vitals:   04/06/15 2100 04/07/15 0134 04/07/15 0509 04/07/15 0959  BP: 136/78 119/69 139/86 96/58  Pulse: 95 118 119 109  Temp: 98.6 F (37 C) 100.6 F (38.1 C) 100 F (37.8 C) 99.2 F (37.3 C)  TempSrc: Oral Oral Oral Oral  Resp: 18 18 20 18   Height:      Weight:      SpO2: 98% 95% 97% 97%    Intake/Output Summary (Last 24 hours) at 04/07/15 1122 Last data filed at 04/07/15 0946  Gross per 24 hour  Intake   1490 ml  Output    520 ml  Net    970 ml   Filed Weights   04/04/15 0800  Weight: 70 kg (154 lb 5.2 oz)     Exam:  General exam: Confused and did  not even answer simple questions like his name or his date of birth. Respiratory system: Clear. No increased work of breathing. Cardiovascular system: S1 & S2 heard, RRR. No JVD, murmurs, gallops, clicks or pedal edema. Gastrointestinal system: Abdomen is nondistended, soft and nontender. Normal bowel sounds heard. Central nervous system: Alert and oriented. No focal neurological deficits. Extremities: Symmetric 5 x 5 power in upper extremities and grade  4 x 5 power in lower extremities. Skin: Extensive psoriatic lesions on back and all limbs.   Data Reviewed: Basic Metabolic Panel:  Recent Labs Lab 04/03/15 2355 04/04/15 0657 04/05/15 0440 04/07/15 0359  NA 137 136 138 136  K 3.8 4.1 3.6 4.5  CL 100* 100* 103 102  CO2 28 28 31 31   GLUCOSE 99 186* 90 108*  BUN 12 17 14 14   CREATININE 1.01 1.05 0.76 0.94  CALCIUM 11.9* 11.2* 10.6* 9.8   Liver Function Tests:  Recent Labs Lab 04/03/15 2355 04/05/15 0440 04/07/15 0359  AST 112* 68* 60*  ALT 121* 83* 59  ALKPHOS 106 82 64  BILITOT 1.0 1.2 1.1  PROT 6.5 5.8* 5.1*  ALBUMIN 3.6 3.2* 2.6*   No results for input(s): LIPASE, AMYLASE in the last 168 hours.  Recent Labs Lab 04/03/15 2355  AMMONIA 49*   CBC:  Recent Labs Lab 04/03/15 2355 04/04/15 0657 04/05/15 0440 04/07/15 0359  WBC 5.4 3.6* 4.0 5.2  NEUTROABS 3.2  --   --   --   HGB 13.3 12.1* 11.2* 8.2*  HCT 41.1 37.6* 33.9* 25.3*  MCV 105.1* 106.5* 104.6* 105.0*  PLT 106* 95* 87* 97*   Cardiac Enzymes:  Recent Labs Lab 04/03/15 2355  CKTOTAL 29*   BNP (last 3 results) No results for input(s): PROBNP in the last 8760 hours. CBG: No results for input(s): GLUCAP in the last 168 hours.  Recent Results (from the past 240 hour(s))  Surgical pcr screen     Status: Abnormal   Collection Time: 04/05/15 11:19 PM  Result Value Ref Range Status   MRSA, PCR NEGATIVE NEGATIVE Final   Staphylococcus aureus POSITIVE (A) NEGATIVE Final    Comment:        The Xpert SA Assay (FDA approved for NASAL specimens in patients over 69 years of age), is one component of a comprehensive surveillance program.  Test performance has been validated by [facility] for patients greater than or equal to 57 year old. It is not intended to diagnose infection nor to guide or monitor treatment.   Culture, blood (routine x 2)     Status: None (Preliminary result)   Collection Time: 04/07/15  8:17 AM  Result Value Ref Range  Status   Specimen Description BLOOD LEFT ARM  Final   Special Requests BOTTLES DRAWN AEROBIC AND ANAEROBIC 10CC  Final   Culture PENDING  Incomplete   Report Status PENDING  Incomplete  Culture, blood (routine x 2)     Status: None (Preliminary result)   Collection Time: 04/07/15  8:25 AM  Result Value Ref Range Status   Specimen Description BLOOD LEFT ARM  Final   Special Requests BOTTLES DRAWN AEROBIC AND ANAEROBIC 10CC  Final   Culture PENDING  Incomplete   Report Status PENDING  Incomplete        Studies: Dg Lumbar Spine 2-3 Views  04/06/2015   CLINICAL DATA:  Intraoperative fluoroscopy for posterior lumbar interbody fusion at L4-5 with hardware removal.  EXAM: LUMBAR SPINE - 2-3 VIEW; DG C-ARM 61-120 MIN  COMPARISON:  04/06/2015 portable cross-table intraoperative lumbar spine radiograph.  FINDINGS: Two nondiagnostic intraoperative spot fluoroscopic images of the lumbar spine were provided. Fluoroscopy time 27 seconds. Bone cage markers are present in the L4-5 disc space. Bilateral posterior approach pedicle screws are noted at L4 and L5.  IMPRESSION: Intraoperative fluoroscopy for posterior lumbar interbody fusion at L4-5.   Electronically Signed   By: Delbert Phenix M.D.   On: 04/06/2015 12:53   Dg Lumbar Spine 1 View  04/06/2015   CLINICAL DATA:  Posterior fusion  EXAM: LUMBAR SPINE - 1 VIEW  COMPARISON:  MR lumbar spine of 03/29/2015  FINDINGS: A cross-table lateral portable view of the lumbar spine from the operating room shows hardware for fusion at L4-5. Alignment of the lumbar vertebrae is normal. A needle is positioned beneath the spinous process of L5 directed toward the L5-S1 interspace.  IMPRESSION: Needle directed to L5-S1 interspace. Hardware is present for posterior fusion at L4-5.   Electronically Signed   By: Dwyane Dee M.D.   On: 04/06/2015 12:18   Dg C-arm 1-60 Min  04/06/2015   CLINICAL DATA:  Intraoperative fluoroscopy for posterior lumbar interbody fusion at L4-5 with  hardware removal.  EXAM: LUMBAR SPINE - 2-3 VIEW; DG C-ARM 61-120 MIN  COMPARISON:  04/06/2015 portable cross-table intraoperative lumbar spine radiograph.  FINDINGS: Two nondiagnostic intraoperative spot fluoroscopic images of the lumbar spine were provided. Fluoroscopy time 27 seconds. Bone cage markers are present in the L4-5 disc space. Bilateral posterior approach pedicle screws are noted at L4 and L5.  IMPRESSION: Intraoperative fluoroscopy for posterior lumbar interbody fusion at L4-5.   Electronically Signed   By: Delbert Phenix M.D.   On: 04/06/2015 12:53        Scheduled Meds: . aspirin  81 mg Oral Daily  . Chlorhexidine Gluconate Cloth  6 each Topical Daily  . clobetasol  1 application Topical Q7 days  . cloNIDine  0.3 mg Oral BID  . folic acid  1 mg Oral Daily  . ketorolac  30 mg Intravenous 4 times per day  . lithium carbonate  900 mg Oral QHS  . multivitamin with minerals  1 tablet Oral Daily  . mupirocin ointment  1 application Nasal BID  . nicotine  21 mg Transdermal Daily  . pantoprazole  40 mg Oral Daily  . thiamine  100 mg Oral Daily   Or  . thiamine  100 mg Intravenous Daily  . traZODone  200 mg Oral QHS   Continuous Infusions:   Principal Problem:   Back pain Active Problems:   Bipolar disorder   Bipolar 1 disorder, depressed, moderate   Lumbago   Tobacco abuse   Uncomplicated alcohol dependence   Other pancytopenia    Time spent: 25 minutes.    [facility] A, MD,[facility] Pager [phone]  If 7PM-7AM, please contact night-coverage [email or web address] Password [email or web address] 04/07/2015, 11:22 AM

## 2015-04-07 NOTE — Evaluation (Signed)
Occupational Therapy Evaluation Patient Details Name: JABARIE POP MRN: [id number] DOB: 1961/05/30 Today's Date: 04/07/2015    History of Present Illness 54 yo male admitted with back pain. Hx of peripheral neuropathy, tachycardia, bipolar, avascular necrosis, Hep C, L4-L5 fusion, L3-L4 spinal stenosis; now s/p L4-5 laminectomy and PLIF.   Clinical Impression   Patient presenting with decreased cognition, decreased safety awareness, decreased balance, decreased functional mobility, and decreased I in self care.  Patient Mod I PTA. Patient currently functioning total A overall. Patient will benefit from acute OT to increase overall independence in the areas of ADLs, functional mobility, safety in order to safely discharge to SNF for continued OT intervention. Pt very lethargic and this session and required total A for supine >sit secondary to precautions. As soon as pt seated on EOB, pt begins to lay self back down before therapist is able to don brace.     Follow Up Recommendations  SNF    Equipment Recommendations  Other (comment) (defer to next venue of care)    Recommendations for Other Services       Precautions / Restrictions Precautions Precautions: Fall;Back Required Braces or Orthoses: Spinal Brace Spinal Brace: Lumbar corset;Applied in sitting position Restrictions Weight Bearing Restrictions: No      Mobility Bed Mobility Overal bed mobility: Needs Assistance Bed Mobility: Sit to Supine Rolling: Total assist Sidelying to sit: Total assist   Sit to supine: Min assist   General bed mobility comments: total A secondary to pt unable to follow simple 1 step commands     Balance Overall balance assessment: Needs assistance   Sitting balance-Leahy Scale: Poor Sitting balance - Comments: needs UE for support                                     ADL Overall ADL's : Needs assistance/impaired               General ADL Comments: Pt very  lethargic this session and requiring total A for all tasks.                Pertinent Vitals/Pain Pain Assessment: Faces Pain Location: back with mobility Pain Descriptors / Indicators: Grimacing Pain Intervention(s): Monitored during session;RN gave pain meds during session;Repositioned     Hand Dominance Right   Extremity/Trunk Assessment Upper Extremity Assessment Upper Extremity Assessment: Overall WFL for tasks assessed   Lower Extremity Assessment Lower Extremity Assessment: Defer to PT evaluation   Cervical / Trunk Assessment Cervical / Trunk Assessment: Kyphotic   Communication Communication Communication: No difficulties   Cognition Arousal/Alertness: Lethargic;Suspect due to medications Behavior During Therapy: Agitated;Impulsive Overall Cognitive Status: Impaired/Different from baseline Area of Impairment: Following commands;Safety/judgement;Problem solving Orientation Level: Place;Time;Situation;Disoriented to Current Attention Level: Focused   Following Commands: Follows one step commands inconsistently Safety/Judgement: Decreased awareness of safety;Decreased awareness of deficits   Problem Solving: Slow processing;Difficulty sequencing;Requires verbal cues;Requires tactile cues General Comments: Pt incoherent with speech.Max verbal and tactile cues given for safety this session.              Home Living Family/patient expects to be discharged to:: Private residence (Information obtained from PT evaluation as pt was unable to provide therapist with info) Living Arrangements: Spouse/significant other Available Help at Discharge: Family Type of Home: Mobile home Home Access: Stairs to enter Entrance Stairs-Number of Steps: 1   Home Layout: One level     Bathroom Shower/Tub:  Tub/shower unit;Curtain Shower/tub characteristics: Buyer, retail: Yes   Home Equipment: Environmental consultant - 2 wheels;Cane - single point           Prior Functioning/Environment Level of Independence: Independent with assistive device(s)        Comments: using cane PTA    OT Diagnosis: Generalized weakness;Cognitive deficits;Acute pain   OT Problem List: Decreased activity tolerance;Decreased cognition;Decreased knowledge of use of DME or AE;Pain;Decreased knowledge of precautions;Decreased safety awareness;Impaired balance (sitting and/or standing)   OT Treatment/Interventions: Self-care/ADL training;Energy conservation;Balance training;Therapeutic exercise;DME and/or AE instruction;Therapeutic activities;Patient/family education    OT Goals(Current goals can be found in the care plan section) Acute Rehab OT Goals Patient Stated Goal: pt unable to state Time For Goal Achievement: 04/21/15 ADL Goals Pt Will Perform Grooming: with supervision;sitting Pt Will Perform Upper Body Bathing: with set-up;sitting Pt Will Perform Lower Body Bathing: with min assist;sit to/from stand Pt Will Perform Upper Body Dressing: with set-up;sitting Pt Will Perform Lower Body Dressing: with min assist;with adaptive equipment;sit to/from stand Pt Will Transfer to Toilet: with supervision;bedside commode Pt Will Perform Toileting - Clothing Manipulation and hygiene: with min assist;with adaptive equipment;sit to/from stand  OT Frequency: Min 2X/week   Barriers to D/C:    none known at this time          End of Session Equipment Utilized During Treatment:  (pt returning to supine before therapist can assist pt into brace) Nurse Communication: Mobility status  Activity Tolerance: Patient limited by lethargy Patient left: in bed;with call bell/phone within reach;with bed alarm set   Time: 1420-1433 OT Time Calculation (min): 13 min Charges:  OT General Charges $OT Visit: 1 Procedure OT Evaluation $Initial OT Evaluation Tier I: 1 Procedure  Lowella Grip, MS, OTR/L 04/07/2015, 4:02 PM

## 2015-04-08 ENCOUNTER — Observation Stay (HOSPITAL_COMMUNITY): Payer: Medicare Other

## 2015-04-08 DIAGNOSIS — B1920 Unspecified viral hepatitis C without hepatic coma: Secondary | ICD-10-CM | POA: Diagnosis present

## 2015-04-08 DIAGNOSIS — S7292XA Unspecified fracture of left femur, initial encounter for closed fracture: Secondary | ICD-10-CM | POA: Diagnosis not present

## 2015-04-08 DIAGNOSIS — Z96643 Presence of artificial hip joint, bilateral: Secondary | ICD-10-CM | POA: Diagnosis present

## 2015-04-08 DIAGNOSIS — Z885 Allergy status to narcotic agent status: Secondary | ICD-10-CM | POA: Diagnosis not present

## 2015-04-08 DIAGNOSIS — F3110 Bipolar disorder, current episode manic without psychotic features, unspecified: Secondary | ICD-10-CM | POA: Diagnosis not present

## 2015-04-08 DIAGNOSIS — G629 Polyneuropathy, unspecified: Secondary | ICD-10-CM | POA: Diagnosis present

## 2015-04-08 DIAGNOSIS — M5489 Other dorsalgia: Secondary | ICD-10-CM | POA: Diagnosis present

## 2015-04-08 DIAGNOSIS — Z981 Arthrodesis status: Secondary | ICD-10-CM | POA: Diagnosis not present

## 2015-04-08 DIAGNOSIS — M4806 Spinal stenosis, lumbar region: Secondary | ICD-10-CM | POA: Diagnosis present

## 2015-04-08 DIAGNOSIS — M5144 Schmorl's nodes, thoracic region: Secondary | ICD-10-CM | POA: Diagnosis present

## 2015-04-08 DIAGNOSIS — F1020 Alcohol dependence, uncomplicated: Secondary | ICD-10-CM | POA: Diagnosis not present

## 2015-04-08 DIAGNOSIS — R509 Fever, unspecified: Secondary | ICD-10-CM | POA: Diagnosis not present

## 2015-04-08 DIAGNOSIS — F3132 Bipolar disorder, current episode depressed, moderate: Secondary | ICD-10-CM | POA: Diagnosis present

## 2015-04-08 DIAGNOSIS — Z881 Allergy status to other antibiotic agents status: Secondary | ICD-10-CM | POA: Diagnosis not present

## 2015-04-08 DIAGNOSIS — M5116 Intervertebral disc disorders with radiculopathy, lumbar region: Secondary | ICD-10-CM | POA: Diagnosis present

## 2015-04-08 DIAGNOSIS — D61818 Other pancytopenia: Secondary | ICD-10-CM | POA: Diagnosis present

## 2015-04-08 DIAGNOSIS — Z720 Tobacco use: Secondary | ICD-10-CM | POA: Diagnosis not present

## 2015-04-08 DIAGNOSIS — J690 Pneumonitis due to inhalation of food and vomit: Secondary | ICD-10-CM | POA: Diagnosis not present

## 2015-04-08 DIAGNOSIS — I10 Essential (primary) hypertension: Secondary | ICD-10-CM | POA: Diagnosis not present

## 2015-04-08 DIAGNOSIS — R1110 Vomiting, unspecified: Secondary | ICD-10-CM | POA: Diagnosis not present

## 2015-04-08 DIAGNOSIS — K219 Gastro-esophageal reflux disease without esophagitis: Secondary | ICD-10-CM | POA: Diagnosis present

## 2015-04-08 DIAGNOSIS — Z818 Family history of other mental and behavioral disorders: Secondary | ICD-10-CM | POA: Diagnosis not present

## 2015-04-08 DIAGNOSIS — D7589 Other specified diseases of blood and blood-forming organs: Secondary | ICD-10-CM | POA: Diagnosis present

## 2015-04-08 DIAGNOSIS — Z888 Allergy status to other drugs, medicaments and biological substances status: Secondary | ICD-10-CM | POA: Diagnosis not present

## 2015-04-08 DIAGNOSIS — Z7982 Long term (current) use of aspirin: Secondary | ICD-10-CM | POA: Diagnosis not present

## 2015-04-08 DIAGNOSIS — R52 Pain, unspecified: Secondary | ICD-10-CM | POA: Diagnosis not present

## 2015-04-08 DIAGNOSIS — F319 Bipolar disorder, unspecified: Secondary | ICD-10-CM | POA: Diagnosis not present

## 2015-04-08 DIAGNOSIS — L409 Psoriasis, unspecified: Secondary | ICD-10-CM | POA: Diagnosis present

## 2015-04-08 DIAGNOSIS — M48062 Spinal stenosis, lumbar region with neurogenic claudication: Secondary | ICD-10-CM | POA: Diagnosis present

## 2015-04-08 DIAGNOSIS — R410 Disorientation, unspecified: Secondary | ICD-10-CM | POA: Diagnosis not present

## 2015-04-08 DIAGNOSIS — K7460 Unspecified cirrhosis of liver: Secondary | ICD-10-CM | POA: Diagnosis present

## 2015-04-08 DIAGNOSIS — G4733 Obstructive sleep apnea (adult) (pediatric): Secondary | ICD-10-CM | POA: Diagnosis present

## 2015-04-08 DIAGNOSIS — S42252A Displaced fracture of greater tuberosity of left humerus, initial encounter for closed fracture: Secondary | ICD-10-CM | POA: Diagnosis not present

## 2015-04-08 DIAGNOSIS — Z79899 Other long term (current) drug therapy: Secondary | ICD-10-CM | POA: Diagnosis not present

## 2015-04-08 DIAGNOSIS — Z7951 Long term (current) use of inhaled steroids: Secondary | ICD-10-CM | POA: Diagnosis not present

## 2015-04-08 DIAGNOSIS — Z9114 Patient's other noncompliance with medication regimen: Secondary | ICD-10-CM | POA: Diagnosis not present

## 2015-04-08 DIAGNOSIS — F17210 Nicotine dependence, cigarettes, uncomplicated: Secondary | ICD-10-CM | POA: Diagnosis present

## 2015-04-08 DIAGNOSIS — Z8249 Family history of ischemic heart disease and other diseases of the circulatory system: Secondary | ICD-10-CM | POA: Diagnosis not present

## 2015-04-08 DIAGNOSIS — Z515 Encounter for palliative care: Secondary | ICD-10-CM | POA: Diagnosis not present

## 2015-04-08 LAB — CBC
HCT: 26.9 % — ABNORMAL LOW (ref 39.0–52.0)
Hemoglobin: 8.6 g/dL — ABNORMAL LOW (ref 13.0–17.0)
MCH: 34.1 pg — AB (ref 26.0–34.0)
MCHC: 32 g/dL (ref 30.0–36.0)
MCV: 106.7 fL — ABNORMAL HIGH (ref 78.0–100.0)
PLATELETS: 116 10*3/uL — AB (ref 150–400)
RBC: 2.52 MIL/uL — ABNORMAL LOW (ref 4.22–5.81)
RDW: 12.8 % (ref 11.5–15.5)
WBC: 7.3 10*3/uL (ref 4.0–10.5)

## 2015-04-08 LAB — COMPREHENSIVE METABOLIC PANEL
ALBUMIN: 2.7 g/dL — AB (ref 3.5–5.0)
ALT: 50 U/L (ref 17–63)
ANION GAP: 5 (ref 5–15)
AST: 54 U/L — ABNORMAL HIGH (ref 15–41)
Alkaline Phosphatase: 69 U/L (ref 38–126)
BUN: 16 mg/dL (ref 6–20)
CALCIUM: 10.6 mg/dL — AB (ref 8.9–10.3)
CHLORIDE: 102 mmol/L (ref 101–111)
CO2: 28 mmol/L (ref 22–32)
Creatinine, Ser: 0.95 mg/dL (ref 0.61–1.24)
GFR calc non Af Amer: >60 mL/min (ref >60)
Glucose, Bld: 107 mg/dL — ABNORMAL HIGH (ref 65–99)
POTASSIUM: 4.8 mmol/L (ref 3.5–5.1)
SODIUM: 135 mmol/L (ref 135–145)
Total Bilirubin: 1.3 mg/dL — ABNORMAL HIGH (ref 0.3–1.2)
Total Protein: 5.6 g/dL — ABNORMAL LOW (ref 6.5–8.1)

## 2015-04-08 LAB — URINE CULTURE: Culture: NO GROWTH

## 2015-04-08 MED ORDER — DEXTROSE-NACL 5-0.9 % IV SOLN
INTRAVENOUS | Status: DC
  Administered 2015-04-08 – 2015-04-09 (×3): via INTRAVENOUS

## 2015-04-08 NOTE — Progress Notes (Signed)
Physical Therapy Treatment Patient Details Name: [name] MRN: [id number] DOB: Jul 28, 1961 Today's Date: 04/08/2015    History of Present Illness 54 yo male admitted with back pain. Hx of peripheral neuropathy, tachycardia, bipolar, avascular necrosis, Hep C, L4-L5 fusion, L3-L4 spinal stenosis; now s/p L4-5 laminectomy and PLIF.    PT Comments    Patient confusion continues, inconsistent following of commands/requests.  Difficulty with balance and strength, falling to side in sitting, and not standing fully in standing.  Pain with transfers and mobility, eases again when position attained.  Patient will require skilled PT assistance with mobility going forward, significantly impaired by confusion and withdrawal.  Follow Up Recommendations  SNF;Supervision/Assistance - 24 hour     Equipment Recommendations  None recommended by PT    Recommendations for Other Services       Precautions / Restrictions Precautions Precautions: Fall;Back Required Braces or Orthoses: Spinal Brace Spinal Brace: Lumbar corset;Applied in sitting position Restrictions Weight Bearing Restrictions: No    Mobility  Bed Mobility Overal bed mobility: Needs Assistance Bed Mobility: Sit to Supine Rolling: Total assist Sidelying to sit: Total assist   Sit to supine: Min assist Sit to sidelying: Min assist General bed mobility comments: total A secondary to pt unable to follow simple 1 step commands  Transfers Overall transfer level: Needs assistance Equipment used: Rolling walker (2 wheeled);2 person hand held assist Transfers: Sit to/from Stand Sit to Stand: +2 physical assistance;Max assist         General transfer comment: Unable to stand with RW, standing only with MAX assist at edge of bed.  Ambulation/Gait                 Stairs            Wheelchair Mobility    Modified Rankin (Stroke Patients Only)       Balance Overall balance assessment: Needs assistance    Sitting balance-Leahy Scale: Poor       Standing balance-Leahy Scale: Poor                      Cognition Arousal/Alertness: Lethargic;Suspect due to medications Behavior During Therapy: Agitated;Impulsive Overall Cognitive Status: Impaired/Different from baseline Area of Impairment: Following commands;Safety/judgement;Problem solving Orientation Level: Disoriented to;Place;Time;Situation Current Attention Level: Focused   Following Commands: Follows one step commands inconsistently Safety/Judgement: Decreased awareness of safety;Decreased awareness of deficits   Problem Solving: Slow processing;Difficulty sequencing;Requires verbal cues;Requires tactile cues General Comments: Pt incoherent with speech.Max verbal and tactile cues given for safety this session.    Exercises      General Comments        Pertinent Vitals/Pain Pain Assessment: Faces Faces Pain Scale: Hurts little more Pain Location: back with movement/transfers Pain Descriptors / Indicators: Grimacing Pain Intervention(s): Monitored during session;Repositioned    Home Living                      Prior Function            PT Goals (current goals can now be found in the care plan section) Acute Rehab PT Goals Patient Stated Goal: pt unable to state PT Goal Formulation: With patient Time For Goal Achievement: 04/18/15 Potential to Achieve Goals: Fair Progress towards PT goals: Not progressing toward goals - comment (Confusion limiting, withdrawl symptoms)    Frequency  Min 5X/week    PT Plan Current plan remains appropriate    Co-evaluation  End of Session Equipment Utilized During Treatment: Back brace Activity Tolerance: Treatment limited secondary to agitation Patient left: in bed;with nursing/sitter in room;with restraints reapplied     Time: 1450-1510 PT Time Calculation (min) (ACUTE ONLY): 20 min  Charges:  $Therapeutic Activity: 8-22 mins                     G Codes:  Functional Assessment Tool Used:  (clinical judgement)   Freida Busman, Jabir Dahlem L 04/08/2015, 4:19 PM

## 2015-04-08 NOTE — Progress Notes (Signed)
Patient ID: [name], male   DOB: [birth year], 54 y.o.   MRN: [id number] Afeb, vss Markedly confused and mumbling mostly. Medicine service managing those issues. Is stable from surgical stand point. Will need to increase activity once more with it.

## 2015-04-08 NOTE — Progress Notes (Signed)
Patient refused CPAP.

## 2015-04-08 NOTE — Progress Notes (Addendum)
PROGRESS NOTE    [name] FAO:[id number] DOB: [birth year] DOA: 04/03/2015 PCP: Arlan Organ., MD    Subjective: Continues to be confused, on CIWA protocol, poor to no oral intake secondary to confusion. Mumbling, sitter at bedside for safety. Low-grade temperature, continue CIWA protocol, no changes clinically.  HPI/Brief narrative 54 year old male with history of multiple back surgeries, ongoing alcohol abuse, tobacco abuse, bipolar disorder on lithium, psoriasis, HTN, hepatitis C, cirrhosis, GERD and chronic back pain states that he's been having worsening low back pain since July 4 but for the last 1 week has noted pain in the back of both his legs extending from the hips down to his feet, difficulty to stand up or ambulate for long due to pain and apparently his wife found him on the floor due to severe pain. He denies difficulty with bowel or bladder. He was seen in the ED on 8/24, MRI L-spine was performed and patient was discharged home on when necessary Valium, Percocet, and 5 days of prednisone and advised to follow-up with neurosurgery. Now admitted for severe low back pain. Neurosurgery consulted - recommended surgical intervention in am. [facility].    Assessment/Plan:  Acute on chronic low back pain with sciatica - Reviewed MRI L-spine report from 8/24-results as below. 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 - Seen by Dr. Newell Coral of neurosurgery, surgery is done 04/06/2015 - Continue to control pain with narcotics.  Alcohol abuse/hepatitis C/cirrhosis - Patient continues to drink up to 4 beers daily and vodka occasionally. - CIWA protocol - INR is 1.1  Hypercalcemia - May be related to dehydration. Brief IV normal saline hydration and follow. Calcium level improved to 10.6, but still slightly high.   Pancytopenia/macrocytosis -  Likely related to ongoing alcohol abuse related bone marrow toxicity and cirrhosis. - Follow CBCs. Alcohol abstinence. - platelet transfusion as needed to keep ti greater than 1,00,000.   Tobacco abuse - Cessation counseled. Nicotine patch.  Essential hypertension - Controlled off medications.  Bipolar disorder - Patient on lithium at home-continue. Lithium levels low limits of normal.  Psoriasis - Patient has extensive skin psoriatic lesions on his back, limbs. Outpatient follow-up.  Mildly elevated ammonia - Patient appears coherent. No clear encephalopathy.    DVT prophylaxis: DC subcutaneous heparin and place on SCDs. Code Status: Full. Family Communication: non at bedside. . Disposition Plan: SNF at some point after the confusion improves.   Consultants:  Neurosurgery/Dr. Newell Coral  Procedures:  None  Antibiotics:  None    Objective: Filed Vitals:   04/07/15 2326 04/08/15 0148 04/08/15 0558 04/08/15 1001  BP: 136/73 125/65 141/79 127/81  Pulse: 101 88 97 91  Temp: 98.6 F (37 C) 100.2 F (37.9 C) 99.6 F (37.6 C) 99.1 F (37.3 C)  TempSrc: Axillary Oral Oral Oral  Resp: 20 18 18 18   Height:      Weight:      SpO2: 95% 97% 99% 98%    Intake/Output Summary (Last 24 hours) at 04/08/15 1016 Last data filed at 04/08/15 0600  Gross per 24 hour  Intake      0 ml  Output    600 ml  Net   -600 ml   Filed Weights   04/04/15 0800  Weight: 70 kg (154 lb 5.2 oz)     Exam:  General exam: Confused and  did not even answer simple questions like his name or his date of birth. Respiratory system: Clear. No increased work of breathing. Cardiovascular system: S1 & S2 heard, RRR. No JVD, murmurs, gallops, clicks or pedal edema. Gastrointestinal system: Abdomen is nondistended, soft and nontender. Normal bowel sounds heard. Central nervous system: Alert and oriented. No focal neurological deficits. Extremities: Symmetric 5 x 5 power in upper extremities and  grade 4 x 5 power in lower extremities. Skin: Extensive psoriatic lesions on back and all limbs.   Data Reviewed: Basic Metabolic Panel:  Recent Labs Lab 04/03/15 2355 04/04/15 0657 04/05/15 0440 04/07/15 0359 04/08/15 0421  NA 137 136 138 136 135  K 3.8 4.1 3.6 4.5 4.8  CL 100* 100* 103 102 102  CO2 28 28 31 31 28   GLUCOSE 99 186* 90 108* 107*  BUN 12 17 14 14 16   CREATININE 1.01 1.05 0.76 0.94 0.95  CALCIUM 11.9* 11.2* 10.6* 9.8 10.6*   Liver Function Tests:  Recent Labs Lab 04/03/15 2355 04/05/15 0440 04/07/15 0359 04/08/15 0421  AST 112* 68* 60* 54*  ALT 121* 83* 59 50  ALKPHOS 106 82 64 69  BILITOT 1.0 1.2 1.1 1.3*  PROT 6.5 5.8* 5.1* 5.6*  ALBUMIN 3.6 3.2* 2.6* 2.7*   No results for input(s): LIPASE, AMYLASE in the last 168 hours.  Recent Labs Lab 04/03/15 2355 04/07/15 1210  AMMONIA 49* 28   CBC:  Recent Labs Lab 04/03/15 2355 04/04/15 0657 04/05/15 0440 04/07/15 0359 04/08/15 0421  WBC 5.4 3.6* 4.0 5.2 7.3  NEUTROABS 3.2  --   --   --   --   HGB 13.3 12.1* 11.2* 8.2* 8.6*  HCT 41.1 37.6* 33.9* 25.3* 26.9*  MCV 105.1* 106.5* 104.6* 105.0* 106.7*  PLT 106* 95* 87* 97* 116*   Cardiac Enzymes:  Recent Labs Lab 04/03/15 2355  CKTOTAL 29*   BNP (last 3 results) No results for input(s): PROBNP in the last 8760 hours. CBG: No results for input(s): GLUCAP in the last 168 hours.  Recent Results (from the past 240 hour(s))  Surgical pcr screen     Status: Abnormal   Collection Time: 04/05/15 11:19 PM  Result Value Ref Range Status   MRSA, PCR NEGATIVE NEGATIVE Final   Staphylococcus aureus POSITIVE (A) NEGATIVE Final    Comment:        The Xpert SA Assay (FDA approved for NASAL specimens in patients over 66 years of age), is one component of a comprehensive surveillance program.  Test performance has been validated by [facility] for patients greater than or equal to 50 year old. It is not intended to diagnose infection nor  to guide or monitor treatment.   Culture, blood (routine x 2)     Status: None (Preliminary result)   Collection Time: 04/07/15  8:17 AM  Result Value Ref Range Status   Specimen Description BLOOD LEFT ARM  Final   Special Requests BOTTLES DRAWN AEROBIC AND ANAEROBIC 10CC  Final   Culture PENDING  Incomplete   Report Status PENDING  Incomplete  Culture, blood (routine x 2)     Status: None (Preliminary result)   Collection Time: 04/07/15  8:25 AM  Result Value Ref Range Status   Specimen Description BLOOD LEFT ARM  Final   Special Requests BOTTLES DRAWN AEROBIC AND ANAEROBIC 10CC  Final   Culture PENDING  Incomplete   Report Status PENDING  Incomplete        Studies: Dg Lumbar Spine 2-3  Views  04/06/2015   CLINICAL DATA:  Intraoperative fluoroscopy for posterior lumbar interbody fusion at L4-5 with hardware removal.  EXAM: LUMBAR SPINE - 2-3 VIEW; DG C-ARM 61-120 MIN  COMPARISON:  04/06/2015 portable cross-table intraoperative lumbar spine radiograph.  FINDINGS: Two nondiagnostic intraoperative spot fluoroscopic images of the lumbar spine were provided. Fluoroscopy time 27 seconds. Bone cage markers are present in the L4-5 disc space. Bilateral posterior approach pedicle screws are noted at L4 and L5.  IMPRESSION: Intraoperative fluoroscopy for posterior lumbar interbody fusion at L4-5.   Electronically Signed   By: Delbert Phenix M.D.   On: 04/06/2015 12:53   Dg C-arm 1-60 Min  04/06/2015   CLINICAL DATA:  Intraoperative fluoroscopy for posterior lumbar interbody fusion at L4-5 with hardware removal.  EXAM: LUMBAR SPINE - 2-3 VIEW; DG C-ARM 61-120 MIN  COMPARISON:  04/06/2015 portable cross-table intraoperative lumbar spine radiograph.  FINDINGS: Two nondiagnostic intraoperative spot fluoroscopic images of the lumbar spine were provided. Fluoroscopy time 27 seconds. Bone cage markers are present in the L4-5 disc space. Bilateral posterior approach pedicle screws are noted at L4 and L5.   IMPRESSION: Intraoperative fluoroscopy for posterior lumbar interbody fusion at L4-5.   Electronically Signed   By: Delbert Phenix M.D.   On: 04/06/2015 12:53        Scheduled Meds: . aspirin  81 mg Oral Daily  . Chlorhexidine Gluconate Cloth  6 each Topical Daily  . clobetasol  1 application Topical Q7 days  . folic acid  1 mg Oral Daily  . ketorolac  30 mg Intravenous 4 times per day  . lithium carbonate  900 mg Oral QHS  . metoprolol tartrate  25 mg Oral BID  . multivitamin with minerals  1 tablet Oral Daily  . mupirocin ointment  1 application Nasal BID  . nicotine  21 mg Transdermal Daily  . pantoprazole  40 mg Oral Daily  . thiamine  100 mg Oral Daily   Or  . thiamine  100 mg Intravenous Daily  . traZODone  200 mg Oral QHS   Continuous Infusions:   Principal Problem:   Back pain Active Problems:   Bipolar disorder   Bipolar 1 disorder, depressed, moderate   Lumbago   Tobacco abuse   Uncomplicated alcohol dependence   Other pancytopenia    Time spent: 25 minutes.    [facility] A, MD,[facility] Pager [phone]  If 7PM-7AM, please contact night-coverage [email or web address] Password [email or web address] 04/08/2015, 10:16 AM

## 2015-04-09 ENCOUNTER — Inpatient Hospital Stay (HOSPITAL_COMMUNITY): Payer: Medicare Other

## 2015-04-09 DIAGNOSIS — M4806 Spinal stenosis, lumbar region: Principal | ICD-10-CM

## 2015-04-09 LAB — URINALYSIS, ROUTINE W REFLEX MICROSCOPIC
Bilirubin Urine: NEGATIVE
GLUCOSE, UA: NEGATIVE mg/dL
Hgb urine dipstick: NEGATIVE
Ketones, ur: NEGATIVE mg/dL
LEUKOCYTES UA: NEGATIVE
Nitrite: NEGATIVE
PH: 8 (ref 5.0–8.0)
Protein, ur: NEGATIVE mg/dL
SPECIFIC GRAVITY, URINE: 1.011 (ref 1.005–1.030)
Urobilinogen, UA: 1 mg/dL (ref 0.0–1.0)

## 2015-04-09 LAB — CBC WITH DIFFERENTIAL/PLATELET
Basophils Absolute: 0 10*3/uL (ref 0.0–0.1)
Basophils Relative: 0 % (ref 0–1)
EOS PCT: 0 % (ref 0–5)
Eosinophils Absolute: 0 10*3/uL (ref 0.0–0.7)
HCT: 25.5 % — ABNORMAL LOW (ref 39.0–52.0)
HEMOGLOBIN: 8.2 g/dL — AB (ref 13.0–17.0)
LYMPHS ABS: 0.8 10*3/uL (ref 0.7–4.0)
LYMPHS PCT: 9 % — AB (ref 12–46)
MCH: 33.6 pg (ref 26.0–34.0)
MCHC: 32.2 g/dL (ref 30.0–36.0)
MCV: 104.5 fL — AB (ref 78.0–100.0)
Monocytes Absolute: 0.9 10*3/uL (ref 0.1–1.0)
Monocytes Relative: 10 % (ref 3–12)
NEUTROS PCT: 81 % — AB (ref 43–77)
Neutro Abs: 7.1 10*3/uL (ref 1.7–7.7)
Platelets: 157 10*3/uL (ref 150–400)
RBC: 2.44 MIL/uL — AB (ref 4.22–5.81)
RDW: 12.5 % (ref 11.5–15.5)
WBC: 8.8 10*3/uL (ref 4.0–10.5)

## 2015-04-09 LAB — BASIC METABOLIC PANEL
Anion gap: 3 — ABNORMAL LOW (ref 5–15)
BUN: 9 mg/dL (ref 6–20)
CHLORIDE: 109 mmol/L (ref 101–111)
CO2: 25 mmol/L (ref 22–32)
Calcium: 11.6 mg/dL — ABNORMAL HIGH (ref 8.9–10.3)
Creatinine, Ser: 0.85 mg/dL (ref 0.61–1.24)
GFR calc Af Amer: >60 mL/min (ref >60)
GFR calc non Af Amer: >60 mL/min (ref >60)
Glucose, Bld: 147 mg/dL — ABNORMAL HIGH (ref 65–99)
POTASSIUM: 4.4 mmol/L (ref 3.5–5.1)
SODIUM: 137 mmol/L (ref 135–145)

## 2015-04-09 MED ORDER — PANTOPRAZOLE SODIUM 40 MG IV SOLR
40.0000 mg | INTRAVENOUS | Status: DC
  Administered 2015-04-09 – 2015-04-10 (×2): 40 mg via INTRAVENOUS
  Filled 2015-04-09 (×2): qty 40

## 2015-04-09 MED ORDER — IBUPROFEN 200 MG PO TABS: 400.0000 mg | ORAL_TABLET | Freq: Once | ORAL | Status: DC

## 2015-04-09 MED ORDER — ONDANSETRON HCL 4 MG/2ML IJ SOLN
4.0000 mg | Freq: Four times a day (QID) | INTRAMUSCULAR | Status: DC | PRN
  Administered 2015-04-09 (×2): 4 mg via INTRAVENOUS
  Filled 2015-04-09 (×3): qty 2

## 2015-04-09 MED ORDER — ENOXAPARIN SODIUM 40 MG/0.4ML ~~LOC~~ SOLN
40.0000 mg | SUBCUTANEOUS | Status: DC
Start: 2015-04-09 — End: 2015-04-11
  Administered 2015-04-09 – 2015-04-11 (×3): 40 mg via SUBCUTANEOUS
  Filled 2015-04-09 (×3): qty 0.4

## 2015-04-09 NOTE — Progress Notes (Signed)
Pt combative and previously vomiting; not placed on cpap.

## 2015-04-09 NOTE — Progress Notes (Signed)
PROGRESS NOTE    SOTA HETZ WUJ:[id number] DOB: 14-Sep-1960 DOA: 04/03/2015 PCP: Arlan Organ., MD    Subjective: Continues to have low-grade temperature to be postsurgical atelectasis versus alcohol withdrawal, and cultures obtained. Still disoriented, likely alcohol withdrawal. Start to wean Ativan protocol.  HPI/Brief narrative 54 year old male with history of multiple back surgeries, ongoing alcohol abuse, tobacco abuse, bipolar disorder on lithium, psoriasis, HTN, hepatitis C, cirrhosis, GERD and chronic back pain states that he's been having worsening low back pain since July 4 but for the last 1 week has noted pain in the back of both his legs extending from the hips down to his feet, difficulty to stand up or ambulate for long due to pain and apparently his wife found him on the floor due to severe pain. He denies difficulty with bowel or bladder. He was seen in the ED on 8/24, MRI L-spine was performed and patient was discharged home on when necessary Valium, Percocet, and 5 days of prednisone and advised to follow-up with neurosurgery. Now admitted for severe low back pain. Neurosurgery consulted - recommended surgical intervention in am. [facility].    Assessment/Plan:  Acute on chronic low back pain with sciatica - Reviewed MRI L-spine report from 8/24-results as below. 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 - Seen by Dr. Newell Coral of neurosurgery, surgery is done 04/06/2015 - Seen again by Dr. Gerlene Fee, surgical incision looks clean without infection.  Alcohol abuse/hepatitis C/cirrhosis - Patient continues to drink up to 4 beers daily and vodka occasionally. - CIWA protocol - INR is 1.1  Hypercalcemia - May be related to dehydration. Brief IV normal saline hydration and follow. Check renal function panel in  a.m.  Pancytopenia/macrocytosis - Likely related to ongoing alcohol abuse related bone marrow toxicity and cirrhosis. - Follow CBCs. Alcohol abstinence. - Platelet transfusion as needed to keep ti greater than 1,00,000.   Tobacco abuse - Cessation counseled. Nicotine patch.  Essential hypertension - Controlled off medications.  Bipolar disorder - Patient on lithium at home-continue. Lithium levels low limits of normal.  Psoriasis - Patient has extensive skin psoriatic lesions on his back, limbs. Outpatient follow-up.  Mildly elevated ammonia - Patient appears coherent. No clear encephalopathy.    DVT prophylaxis: DC subcutaneous heparin and place on SCDs. Code Status: Full. Family Communication: non at bedside. . Disposition Plan: SNF at some point after the confusion improves.   Consultants:  Neurosurgery/Dr. Newell Coral  Procedures:  None  Antibiotics:  None    Objective: Filed Vitals:   04/08/15 2056 04/09/15 0032 04/09/15 0212 04/09/15 0552  BP: 147/79 139/79 153/88 163/87  Pulse: 97 96 103 106  Temp: 98.4 F (36.9 C)  98.6 F (37 C) 100.2 F (37.9 C)  TempSrc: Oral  Axillary Axillary  Resp: 18  20 18   Height:      Weight:      SpO2: 98%  97% 98%    Intake/Output Summary (Last 24 hours) at 04/09/15 1016 Last data filed at 04/09/15 0648  Gross per 24 hour  Intake    120 ml  Output   1500 ml  Net  -1380 ml   Filed Weights   04/04/15 0800  Weight: 70 kg (154 lb 5.2 oz)     Exam:  General exam: Confused and did not even answer simple questions like his name or  his date of birth. Respiratory system: Clear. No increased work of breathing. Cardiovascular system: S1 & S2 heard, RRR. No JVD, murmurs, gallops, clicks or pedal edema. Gastrointestinal system: Abdomen is nondistended, soft and nontender. Normal bowel sounds heard. Central nervous system: Alert and oriented. No focal neurological deficits. Extremities: Symmetric 5 x 5 power in upper  extremities and grade 4 x 5 power in lower extremities. Skin: Extensive psoriatic lesions on back and all limbs.   Data Reviewed: Basic Metabolic Panel:  Recent Labs Lab 04/03/15 2355 04/04/15 0657 04/05/15 0440 04/07/15 0359 04/08/15 0421  NA 137 136 138 136 135  K 3.8 4.1 3.6 4.5 4.8  CL 100* 100* 103 102 102  CO2 28 28 31 31 28   GLUCOSE 99 186* 90 108* 107*  BUN 12 17 14 14 16   CREATININE 1.01 1.05 0.76 0.94 0.95  CALCIUM 11.9* 11.2* 10.6* 9.8 10.6*   Liver Function Tests:  Recent Labs Lab 04/03/15 2355 04/05/15 0440 04/07/15 0359 04/08/15 0421  AST 112* 68* 60* 54*  ALT 121* 83* 59 50  ALKPHOS 106 82 64 69  BILITOT 1.0 1.2 1.1 1.3*  PROT 6.5 5.8* 5.1* 5.6*  ALBUMIN 3.6 3.2* 2.6* 2.7*   No results for input(s): LIPASE, AMYLASE in the last 168 hours.  Recent Labs Lab 04/03/15 2355 04/07/15 1210  AMMONIA 49* 28   CBC:  Recent Labs Lab 04/03/15 2355 04/04/15 0657 04/05/15 0440 04/07/15 0359 04/08/15 0421  WBC 5.4 3.6* 4.0 5.2 7.3  NEUTROABS 3.2  --   --   --   --   HGB 13.3 12.1* 11.2* 8.2* 8.6*  HCT 41.1 37.6* 33.9* 25.3* 26.9*  MCV 105.1* 106.5* 104.6* 105.0* 106.7*  PLT 106* 95* 87* 97* 116*   Cardiac Enzymes:  Recent Labs Lab 04/03/15 2355  CKTOTAL 29*   BNP (last 3 results) No results for input(s): PROBNP in the last 8760 hours. CBG: No results for input(s): GLUCAP in the last 168 hours.  Recent Results (from the past 240 hour(s))  Surgical pcr screen     Status: Abnormal   Collection Time: 04/05/15 11:19 PM  Result Value Ref Range Status   MRSA, PCR NEGATIVE NEGATIVE Final   Staphylococcus aureus POSITIVE (A) NEGATIVE Final    Comment:        The Xpert SA Assay (FDA approved for NASAL specimens in patients over 96 years of age), is one component of a comprehensive surveillance program.  Test performance has been validated by [facility] for patients greater than or equal to 14 year old. It is not intended to diagnose  infection nor to guide or monitor treatment.   Culture, blood (routine x 2)     Status: None (Preliminary result)   Collection Time: 04/07/15  8:17 AM  Result Value Ref Range Status   Specimen Description BLOOD LEFT ARM  Final   Special Requests BOTTLES DRAWN AEROBIC AND ANAEROBIC 10CC  Final   Culture NO GROWTH 1 DAY  Final   Report Status PENDING  Incomplete  Culture, blood (routine x 2)     Status: None (Preliminary result)   Collection Time: 04/07/15  8:25 AM  Result Value Ref Range Status   Specimen Description BLOOD LEFT ARM  Final   Special Requests BOTTLES DRAWN AEROBIC AND ANAEROBIC 10CC  Final   Culture NO GROWTH 1 DAY  Final   Report Status PENDING  Incomplete  Urine culture     Status: None   Collection Time: 04/07/15 12:10 PM  Result Value Ref Range Status   Specimen Description URINE, RANDOM  Final   Special Requests NONE  Final   Culture NO GROWTH 1 DAY  Final   Report Status 04/08/2015 FINAL  Final        Studies: Dg Chest 2 View  04/08/2015   CLINICAL DATA:  Fever.  EXAM: CHEST  2 VIEW  COMPARISON:  04/03/2015.  FINDINGS: The cardiac silhouette remains borderline enlarged. Clear lungs. Bilateral shoulder prostheses.  IMPRESSION: No acute abnormality.   Electronically Signed   By: Beckie Salts M.D.   On: 04/08/2015 15:44   Dg Chest Port 1 View  04/09/2015   CLINICAL DATA:  Fever. Status post back surgery 3 weeks ago. Cough. Vomited this morning.  EXAM: PORTABLE CHEST - 1 VIEW  COMPARISON:  Yesterday.  FINDINGS: The cardiac silhouette remains borderline enlarged. Clear lungs. Bilateral shoulder prostheses are again noted.  IMPRESSION: No acute abnormality.   Electronically Signed   By: Beckie Salts M.D.   On: 04/09/2015 09:55        Scheduled Meds: . aspirin  81 mg Oral Daily  . Chlorhexidine Gluconate Cloth  6 each Topical Daily  . clobetasol  1 application Topical Q7 days  . folic acid  1 mg Oral Daily  . ibuprofen  400 mg Oral Once  . lithium carbonate   900 mg Oral QHS  . metoprolol tartrate  25 mg Oral BID  . multivitamin with minerals  1 tablet Oral Daily  . mupirocin ointment  1 application Nasal BID  . nicotine  21 mg Transdermal Daily  . pantoprazole  40 mg Oral Daily  . thiamine  100 mg Oral Daily  . traZODone  200 mg Oral QHS   Continuous Infusions: . dextrose 5 % and 0.9% NaCl 75 mL/hr at 04/08/15 2200    Principal Problem:   Back pain Active Problems:   Bipolar disorder   Bipolar 1 disorder, depressed, moderate   Lumbago   Tobacco abuse   Uncomplicated alcohol dependence   Other pancytopenia   Lumbar stenosis with neurogenic claudication    Time spent: 25 minutes.    [facility] A, MD,[facility] Pager [phone]  If 7PM-7AM, please contact night-coverage [email or web address] Password [email or web address] 04/09/2015, 10:16 AM    LOS: 1 day

## 2015-04-09 NOTE — Progress Notes (Signed)
Patient ID: [name], male   DOB: 01/13/1961, 54 y.o.   MRN: [id number] No new neuro changes. Remains very disoriented. Dressing clean and dry. Continue present rx per medicine service.

## 2015-04-09 NOTE — Progress Notes (Addendum)
Patient vomited green emesis; abd. Soft; nondistended; bowel sounds present;without BM today; Dr. Arthor Captain notified and orders received; patient without distress at present time; no food intake today; just sips of liquids; patient won't stay alert enough to eat safely; will take sips from straw to command; alert to self only and calls out for others by name; won't keep his eyes open; follows simple commands;continue to monitor; orders followed.

## 2015-04-10 LAB — RENAL FUNCTION PANEL
ANION GAP: 8 (ref 5–15)
Albumin: 2.7 g/dL — ABNORMAL LOW (ref 3.5–5.0)
BUN: 11 mg/dL (ref 6–20)
CALCIUM: 11.9 mg/dL — AB (ref 8.9–10.3)
CHLORIDE: 108 mmol/L (ref 101–111)
CO2: 22 mmol/L (ref 22–32)
CREATININE: 1.12 mg/dL (ref 0.61–1.24)
Glucose, Bld: 108 mg/dL — ABNORMAL HIGH (ref 65–99)
Phosphorus: 1.2 mg/dL — ABNORMAL LOW (ref 2.5–4.6)
Potassium: 4 mmol/L (ref 3.5–5.1)
Sodium: 138 mmol/L (ref 135–145)

## 2015-04-10 MED ORDER — PIPERACILLIN-TAZOBACTAM 3.375 G IVPB
3.3750 g | Freq: Three times a day (TID) | INTRAVENOUS | Status: DC
  Administered 2015-04-10 – 2015-04-11 (×3): 3.375 g via INTRAVENOUS
  Filled 2015-04-10 (×7): qty 50

## 2015-04-10 NOTE — Progress Notes (Signed)
This RN resume care from previous RN, no sitter available at this time. Patient still appears confused/attempting to get out of bed. Frequent rounding with reorientation reminded. Fluid offered. Will continue to monitor.   Valerie Fredin. RN

## 2015-04-10 NOTE — Progress Notes (Signed)
Occupational Therapy Treatment Patient Details Name: [name] MRN: [id number] DOB: 1961/08/03 Today's Date: 04/10/2015    History of present illness 54 yo male admitted with back pain. Hx of peripheral neuropathy, tachycardia, bipolar, avascular necrosis, Hep C, L4-L5 fusion, L3-L4 spinal stenosis; now s/p L4-5 laminectomy and PLIF.   OT comments  Pt with very limited ability to participate in very simple grooming tasks due to severity of confusion/cognitive deficit.  Follows commands ~10% of time.  Recommend SNF.   Follow Up Recommendations  SNF    Equipment Recommendations  None recommended by OT    Recommendations for Other Services      Precautions / Restrictions Precautions Precautions: Fall;Back Required Braces or Orthoses: Spinal Brace Spinal Brace: Lumbar corset;Applied in sitting position       Mobility Bed Mobility Overal bed mobility: Needs Assistance Bed Mobility: Rolling;Sidelying to Sit;Sit to Sidelying Rolling: Supervision Sidelying to sit: Mod assist     Sit to sidelying: Min assist General bed mobility comments: Pt spontaneoulsy will attempt to sit straight up in bed.  He will roll to right and to his back spontaneously.  Required assist to move LEs off bed and to lift shoulders from bed to move sidelying to sitting.  He spontaneously returned to sidelying.  Required min A to left Lt LE onto bed as he left it hanging off the bed   Transfers                 General transfer comment: unable to attempt due to cognitive deficits     Balance Overall balance assessment: Needs assistance Sitting-balance support: Feet supported Sitting balance-Leahy Scale: Poor Sitting balance - Comments: Pt sat EOB x 30 seconds with max cues, bil. UE support and min A - min guard assist.  Pt restless                            ADL Overall ADL's : Needs assistance/impaired     Grooming: Brushing hair;Maximal assistance;Sitting;Bed level Grooming  Details (indicate cue type and reason): Pt able to brush hair minimally with max cues.  He will start activity, but becomes distracted and requires max cues to re-initiate/engage in activity.                                       Vision                     Perception     Praxis      Cognition   Behavior During Therapy: Restless Overall Cognitive Status: Impaired/Different from baseline Area of Impairment: Orientation;Attention;Following commands;Memory;Safety/judgement;Awareness;Problem solving Orientation Level: Disoriented to;Place;Time;Situation Current Attention Level: Focused Memory: Decreased short-term memory;Decreased recall of precautions  Following Commands: Follows one step commands inconsistently Safety/Judgement: Decreased awareness of safety;Decreased awareness of deficits   Problem Solving: Decreased initiation;Difficulty sequencing;Requires verbal cues;Requires tactile cues;Slow processing General Comments: Pt follows ~10% of one step commands.  He is unable to sustain attention to complete task.  Pt very internally distracted.  Perseverates on "I need to go to the bathroom".    Extremity/Trunk Assessment               Exercises     Shoulder Instructions       General Comments      Pertinent Vitals/ Pain       Pain Assessment:  Faces Faces Pain Scale: Hurts little more Pain Location: pt unable to state Pain Descriptors / Indicators: Grimacing Pain Intervention(s): Monitored during session;Limited activity within patient's tolerance  Home Living                                          Prior Functioning/Environment              Frequency Min 2X/week     Progress Toward Goals  OT Goals(current goals can now be found in the care plan section)  Progress towards OT goals: Not progressing toward goals - comment (due to confusion )     Plan Discharge plan remains appropriate    Co-evaluation                  End of Session     Activity Tolerance Other (comment) (cognitive deficits)   Patient Left in bed;with call bell/phone within reach;with nursing/sitter in room   Nurse Communication Mobility status        Time: 1610-9604 OT Time Calculation (min): 15 min  Charges: OT General Charges $OT Visit: 1 Procedure OT Treatments $Self Care/Home Management : 8-22 mins  Kourosh Jablonsky M 04/10/2015, 3:30 PM

## 2015-04-10 NOTE — Progress Notes (Signed)
While administering medicines, pt vomited green emesis. PRN standing order zofran administered with effective result. Will continue to monitor.

## 2015-04-10 NOTE — Progress Notes (Signed)
Patient has refused CPAP for tonight. RT stated if he changes his mind just call RT.

## 2015-04-10 NOTE — Progress Notes (Signed)
ANTIBIOTIC CONSULT NOTE - INITIAL  Pharmacy Consult for Zosyn Indication: empiric for fever and atelectasis  Allergies  Allergen Reactions  . Prochlorperazine Maleate Other (See Comments)    REACTION: jaw lock  . Erythromycin Other (See Comments)    REACTION: GI upset  . Hydromorphone Hcl Itching    REACTION: itching    Patient Measurements: Height: 5\' 11"  (180.3 cm) Weight: 154 lb 5.2 oz (70 kg) IBW/kg (Calculated) : 75.3  Vital Signs: Temp: 100.8 F (38.2 C) (09/05 0938) Temp Source: Oral (09/05 0938) BP: 175/86 mmHg (09/05 0938) Pulse Rate: 81 (09/05 0938)  Labs:  Recent Labs  04/08/15 0421 04/09/15 1220 04/10/15 0504  WBC 7.3 8.8  --   HGB 8.6* 8.2*  --   PLT 116* 157  --   CREATININE 0.95 0.85 1.12   Estimated Creatinine Clearance: 72.9 mL/min (by C-G formula based on Cr of 1.12).  Microbiology: Recent Results (from the past 720 hour(s))  Surgical pcr screen     Status: Abnormal   Collection Time: 04/05/15 11:19 PM  Result Value Ref Range Status   MRSA, PCR NEGATIVE NEGATIVE Final   Staphylococcus aureus POSITIVE (A) NEGATIVE Final    Comment:        The Xpert SA Assay (FDA approved for NASAL specimens in patients over 31 years of age), is one component of a comprehensive surveillance program.  Test performance has been validated by [facility] for patients greater than or equal to 68 year old. It is not intended to diagnose infection nor to guide or monitor treatment.   Culture, blood (routine x 2)     Status: None (Preliminary result)   Collection Time: 04/07/15  8:17 AM  Result Value Ref Range Status   Specimen Description BLOOD LEFT ARM  Final   Special Requests BOTTLES DRAWN AEROBIC AND ANAEROBIC 10CC  Final   Culture NO GROWTH 2 DAYS  Final   Report Status PENDING  Incomplete  Culture, blood (routine x 2)     Status: None (Preliminary result)   Collection Time: 04/07/15  8:25 AM  Result Value Ref Range Status   Specimen Description  BLOOD LEFT ARM  Final   Special Requests BOTTLES DRAWN AEROBIC AND ANAEROBIC 10CC  Final   Culture NO GROWTH 2 DAYS  Final   Report Status PENDING  Incomplete  Urine culture     Status: None   Collection Time: 04/07/15 12:10 PM  Result Value Ref Range Status   Specimen Description URINE, RANDOM  Final   Special Requests NONE  Final   Culture NO GROWTH 1 DAY  Final   Report Status 04/08/2015 FINAL  Final    Medical History: Past Medical History  Diagnosis Date  . Tachycardia   . Peripheral neuropathy   . Alcohol abuse     hx of quit 4/04  . Bilateral external ear infections 2007  . Tobacco abuse   . Bipolar affective disorder     Followed by Dr. [name]  . Avascular necrosis     SP right and left hip replacements and L shoulder arthroplasty, secondary to steroid use.  Has been on embrel in past.  . Psoriasis     Previously on embrel, stopped because of price.   . Hypertension   . Hepatitis C   . ARF (acute renal failure) 2008    Multifactorial in setting of over use of ibuprofen and HCTZ./ACEI  . Cirrhosis   . Back pain   . Pneumonia  hx  . Heart murmur   . Stroke 11    ? "mini stroke"  . GERD (gastroesophageal reflux disease)     occ  . Arthritis   . Pharyngeal edema 03/12/2013   Assessment:     54 yr old male to begin Zosyn for empiric coverage for temp to 100.8 and atelectasis. WBC 8.8.  POD #4 back surgery.  Back dressing noted clean and dry.  Recevied Ancef peri-operatively on 04/06/15.  Blood cultures negative to date. Urine culture negative.   Goal of Therapy:  appropriate Zosyn dose for renal function and infection  Plan:   Zosyn 3.375 gm IV q8hrs (each over 4 hours).  Will follow renal function, culture data, progress.  Dennie Fetters, [place] Pager: [phone] 04/10/2015,2:11 PM

## 2015-04-10 NOTE — Progress Notes (Signed)
No acute events Awake and alert but profoundly disoriented Moving all extremities well No neurological issues Continue medical management

## 2015-04-10 NOTE — Clinical Social Work Note (Signed)
CSW reviewed chart, once pt is medically stable he can transition to [place]. CSW will continue to provided support to the family and assist with discharge needs.  Laiken Nohr, MSW, LCSWA [phone]

## 2015-04-10 NOTE — Progress Notes (Signed)
PROGRESS NOTE    [name] ZOX:[id number] DOB: 1961-04-02 DOA: 04/03/2015 PCP: Arlan Organ., MD    Subjective: Developed low-grade temperature again, 100.8. Although this is could be secondary to atelectasis and/or withdrawal, patient keeps getting fever out start antibiotics.  HPI/Brief narrative 54 year old male with history of multiple back surgeries, ongoing alcohol abuse, tobacco abuse, bipolar disorder on lithium, psoriasis, HTN, hepatitis C, cirrhosis, GERD and chronic back pain states that he's been having worsening low back pain since July 4 but for the last 1 week has noted pain in the back of both his legs extending from the hips down to his feet, difficulty to stand up or ambulate for long due to pain and apparently his wife found him on the floor due to severe pain. He denies difficulty with bowel or bladder. He was seen in the ED on 8/24, MRI L-spine was performed and patient was discharged home on when necessary Valium, Percocet, and 5 days of prednisone and advised to follow-up with neurosurgery. Now admitted for severe low back pain. Neurosurgery consulted - recommended surgical intervention in am. Plan to [facility].    Assessment/Plan:  Acute on chronic low back pain with sciatica - Reviewed MRI L-spine report from 8/24-results as below. 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 - Seen by Dr. Newell Coral of neurosurgery, surgery is done 04/06/2015 - No evidence of surgical wound infection.  Alcohol abuse/hepatitis C/cirrhosis - Patient continues to drink up to 4 beers daily and vodka occasionally. - CIWA protocol - INR is 1.1  Hypercalcemia - May be related to dehydration. Brief IV normal saline hydration and follow. Check renal function panel in a.m.  Pancytopenia/macrocytosis - Likely related to ongoing alcohol abuse related bone  marrow toxicity and cirrhosis. - Follow CBCs. Alcohol abstinence.  Tobacco abuse - Cessation counseled. Nicotine patch.  Essential hypertension - Controlled off medications.  Bipolar disorder - Patient on lithium at home-continue. Lithium levels low limits of normal.  Psoriasis - Patient has extensive skin psoriatic lesions on his back, limbs. Outpatient follow-up.  Mildly elevated ammonia - Patient appears coherent. No clear encephalopathy.    DVT prophylaxis: DC subcutaneous heparin and place on SCDs. Code Status: Full. Family Communication: non at bedside. . Disposition Plan: SNF at some point after the confusion improves.   Consultants:  Neurosurgery/Dr. Newell Coral  Procedures:  None  Antibiotics:  None    Objective: Filed Vitals:   04/09/15 2110 04/10/15 0050 04/10/15 0435 04/10/15 0938  BP: 170/90 156/84 162/87 175/86  Pulse: 88 81 78 81  Temp: 98.8 F (37.1 C) 98.6 F (37 C) 98.7 F (37.1 C) 100.8 F (38.2 C)  TempSrc: Oral Oral Oral Oral  Resp: Height:      Weight:      SpO2: 100% 100% 100% 100%    Intake/Output Summary (Last 24 hours) at 04/10/15 1131 Last data filed at 04/09/15 1700  Gross per 24 hour  Intake      0 ml  Output      0 ml  Net      0 ml   Filed Weights   04/04/15 0800  Weight: 70 kg (154 lb 5.2 oz)     Exam:  General exam: Confused and did not even answer simple questions like his name or his date of birth. Respiratory system: Clear. No  increased work of breathing. Cardiovascular system: S1 & S2 heard, RRR. No JVD, murmurs, gallops, clicks or pedal edema. Gastrointestinal system: Abdomen is nondistended, soft and nontender. Normal bowel sounds heard. Central nervous system: Alert and oriented. No focal neurological deficits. Extremities: Symmetric 5 x 5 power in upper extremities and grade 4 x 5 power in lower extremities. Skin: Extensive psoriatic lesions on back and all limbs.   Data Reviewed: Basic  Metabolic Panel:  Recent Labs Lab 04/05/15 0440 04/07/15 0359 04/08/15 0421 04/09/15 1220 04/10/15 0504  NA 138 136 135 137 138  K 3.6 4.5 4.8 4.4 4.0  CL 103 102 102 109 108  CO2 31 31 28 25 22   GLUCOSE 90 108* 107* 147* 108*  BUN 14 14 16 9 11   CREATININE 0.76 0.94 0.95 0.85 1.12  CALCIUM 10.6* 9.8 10.6* 11.6* 11.9*  PHOS  --   --   --   --  1.2*   Liver Function Tests:  Recent Labs Lab 04/03/15 2355 04/05/15 0440 04/07/15 0359 04/08/15 0421 04/10/15 0504  AST 112* 68* 60* 54*  --   ALT 121* 83* 59 50  --   ALKPHOS 106 82 64 69  --   BILITOT 1.0 1.2 1.1 1.3*  --   PROT 6.5 5.8* 5.1* 5.6*  --   ALBUMIN 3.6 3.2* 2.6* 2.7* 2.7*   No results for input(s): LIPASE, AMYLASE in the last 168 hours.  Recent Labs Lab 04/03/15 2355 04/07/15 1210  AMMONIA 49* 28   CBC:  Recent Labs Lab 04/03/15 2355 04/04/15 0657 04/05/15 0440 04/07/15 0359 04/08/15 0421 04/09/15 1220  WBC 5.4 3.6* 4.0 5.2 7.3 8.8  NEUTROABS 3.2  --   --   --   --  7.1  HGB 13.3 12.1* 11.2* 8.2* 8.6* 8.2*  HCT 41.1 37.6* 33.9* 25.3* 26.9* 25.5*  MCV 105.1* 106.5* 104.6* 105.0* 106.7* 104.5*  PLT 106* 95* 87* 97* 116* 157   Cardiac Enzymes:  Recent Labs Lab 04/03/15 2355  CKTOTAL 29*   BNP (last 3 results) No results for input(s): PROBNP in the last 8760 hours. CBG: No results for input(s): GLUCAP in the last 168 hours.  Recent Results (from the past 240 hour(s))  Surgical pcr screen     Status: Abnormal   Collection Time: 04/05/15 11:19 PM  Result Value Ref Range Status   MRSA, PCR NEGATIVE NEGATIVE Final   Staphylococcus aureus POSITIVE (A) NEGATIVE Final    Comment:        The Xpert SA Assay (FDA approved for NASAL specimens in patients over 56 years of age), is one component of a comprehensive surveillance program.  Test performance has been validated by [facility] for patients greater than or equal to 59 year old. It is not intended to diagnose infection nor to guide  or monitor treatment.   Culture, blood (routine x 2)     Status: None (Preliminary result)   Collection Time: 04/07/15  8:17 AM  Result Value Ref Range Status   Specimen Description BLOOD LEFT ARM  Final   Special Requests BOTTLES DRAWN AEROBIC AND ANAEROBIC 10CC  Final   Culture NO GROWTH 2 DAYS  Final   Report Status PENDING  Incomplete  Culture, blood (routine x 2)     Status: None (Preliminary result)   Collection Time: 04/07/15  8:25 AM  Result Value Ref Range Status   Specimen Description BLOOD LEFT ARM  Final   Special Requests BOTTLES DRAWN AEROBIC AND ANAEROBIC 10CC  Final   Culture NO GROWTH 2 DAYS  Final   Report Status PENDING  Incomplete  Urine culture     Status: None   Collection Time: 04/07/15 12:10 PM  Result Value Ref Range Status   Specimen Description URINE, RANDOM  Final   Special Requests NONE  Final   Culture NO GROWTH 1 DAY  Final   Report Status 04/08/2015 FINAL  Final        Studies: Dg Chest 2 View  04/08/2015   CLINICAL DATA:  Fever.  EXAM: CHEST  2 VIEW  COMPARISON:  04/03/2015.  FINDINGS: The cardiac silhouette remains borderline enlarged. Clear lungs. Bilateral shoulder prostheses.  IMPRESSION: No acute abnormality.   Electronically Signed   By: Beckie Salts M.D.   On: 04/08/2015 15:44   Dg Chest Port 1 View  04/09/2015   CLINICAL DATA:  Fever. Status post back surgery 3 weeks ago. Cough. Vomited this morning.  EXAM: PORTABLE CHEST - 1 VIEW  COMPARISON:  Yesterday.  FINDINGS: The cardiac silhouette remains borderline enlarged. Clear lungs. Bilateral shoulder prostheses are again noted.  IMPRESSION: No acute abnormality.   Electronically Signed   By: Beckie Salts M.D.   On: 04/09/2015 09:55   Dg Abd Portable 1v  04/09/2015   CLINICAL DATA:  54 year old male with vomiting and without bowel movement.  EXAM: PORTABLE ABDOMEN - 1 VIEW  COMPARISON:  None.  FINDINGS: Evaluation of this exam is limited due to respiratory motion artifact. There is mild gaseous  distention of the stomach. Moderate stool and air are noted throughout the colon. There is no evidence of bowel dilatation. There is degenerative changes of the spine. L4-L5 fixation screws noted. No acute fracture.  IMPRESSION: No evidence of bowel obstruction.   Electronically Signed   By: Elgie Collard M.D.   On: 04/09/2015 19:58        Scheduled Meds: . aspirin  81 mg Oral Daily  . Chlorhexidine Gluconate Cloth  6 each Topical Daily  . clobetasol  1 application Topical Q7 days  . enoxaparin (LOVENOX) injection  40 mg Subcutaneous Q24H  . folic acid  1 mg Oral Daily  . ibuprofen  400 mg Oral Once  . lithium carbonate  900 mg Oral QHS  . metoprolol tartrate  25 mg Oral BID  . multivitamin with minerals  1 tablet Oral Daily  . mupirocin ointment  1 application Nasal BID  . nicotine  21 mg Transdermal Daily  . pantoprazole (PROTONIX) IV  40 mg Intravenous Q24H  . thiamine  100 mg Oral Daily  . traZODone  200 mg Oral QHS   Continuous Infusions: . dextrose 5 % and 0.9% NaCl 100 mL/hr at 04/09/15 1715    Principal Problem:   Back pain Active Problems:   Bipolar disorder   Bipolar 1 disorder, depressed, moderate   Lumbago   Tobacco abuse   Uncomplicated alcohol dependence   Other pancytopenia   Lumbar stenosis with neurogenic claudication    Time spent: 25 minutes.    [facility] A, MD,[facility] Pager [phone]  If 7PM-7AM, please contact night-coverage [email or web address] Password [email or web address] 04/10/2015, 11:31 AM    LOS: 2 days

## 2015-04-10 NOTE — Progress Notes (Signed)
Physical Therapy Treatment Note  Clinical Impression: Pt mobility greatly limited by confusion and restlessness in addition to pain and inability to follow commands and comprehend. Pt remains unable to recall back precautions and currently remains unable to care for self at home. Con't to recommend SNF due to both functional and cognitive deficits.    04/10/15 1600  PT Visit Information  Last PT Received On 04/10/15  Assistance Needed +2  History of Present Illness 54 yo male admitted with back pain. Hx of peripheral neuropathy, tachycardia, bipolar, avascular necrosis, Hep C, L4-L5 fusion, L3-L4 spinal stenosis; now s/p L4-5 laminectomy and PLIF.  PT Time Calculation  PT Start Time (ACUTE ONLY) 1403  PT Stop Time (ACUTE ONLY) 1421  PT Time Calculation (min) (ACUTE ONLY) 18 min  Subjective Data  Subjective confused, impulsive, bilat mits  Patient Stated Goal unable to state  Precautions  Precautions Fall;Back  Precaution Booklet Issued No  Precaution Comments pt unable to comprehend precautions. pt with significant noted bruising  Required Braces or Orthoses Spinal Brace  Spinal Brace Lumbar corset;Applied in sitting position  Restrictions  Weight Bearing Restrictions No  Pain Assessment  Pain Assessment Faces  Faces Pain Scale 4  Pain Location unable to state but grimaces with all movement  Pain Descriptors / Indicators Grimacing  Pain Intervention(s) Monitored during session  Cognition  Arousal/Alertness Awake/alert  Behavior During Therapy Restless  Overall Cognitive Status Impaired/Different from baseline  Area of Impairment Orientation;Attention;Following commands;Memory;Safety/judgement;Awareness;Problem solving  Orientation Level Disoriented to;Place;Time;Situation  Current Attention Level Focused  Memory Decreased short-term memory;Decreased recall of precautions  Following Commands Follows one step commands inconsistently  Safety/Judgement Decreased awareness of  safety;Decreased awareness of deficits  Awareness Emergent (pt reporting "i have to go to the bathroom")  Problem Solving Decreased initiation;Difficulty sequencing;Requires verbal cues;Requires tactile cues;Slow processing  General Comments pt following commands about 25% of time. greatly limited by psychosis/confusion and pain  Bed Mobility  Overal bed mobility Needs Assistance  Bed Mobility Rolling;Sidelying to Sit;Sit to Sidelying  Rolling Min assist  Sidelying to sit Mod assist  Sit to sidelying Mod assist;+2 for physical assistance;+2 for safety/equipment  General bed mobility comments pt spontaneous with movement but requires modA for safety and appropriate technique  Transfers  Overall transfer level Needs assistance  Equipment used 2 person hand held assist  Transfers Sit to/from Stand  Sit to Stand Max assist;+2 safety/equipment;+2 physical assistance  General transfer comment required multiple attempts due to pain and inability to follow commands  Ambulation/Gait  Ambulation/Gait assistance Max assist;+2 physical assistance  Ambulation Distance (Feet) 5 Feet (from bed to sink)  Assistive device 2 person hand held assist  Gait Pattern/deviations Step-to pattern;Decreased stride length;Shuffle (on toes)  General Gait Details max v/c's for safey and sequencing. pt complete dependent on PT and Tech  Gait velocity slow  Balance  Overall balance assessment Needs assistance  Sitting-balance support Feet supported  Sitting balance-Leahy Scale Poor  Sitting balance - Comments pt able to sit EOB however restless requiring minA for safety  Standing balance support Bilateral upper extremity supported  Standing balance-Leahy Scale Poor  Standing balance comment attempted to have pt wash hands at sink but then pt distracted by urge to pee and then there was an episode of urinary incontinence  General Comments  General comments (skin integrity, edema, etc.) bruising t/o back and L  LE/groin  PT - End of Session  Equipment Utilized During Treatment Gait belt  Activity Tolerance Patient limited by fatigue;Treatment limited secondary  to agitation  Patient left in bed;with call bell/phone within reach;with nursing/sitter in room  PT - Assessment/Plan  PT Plan Current plan remains appropriate  PT Frequency (ACUTE ONLY) Min 5X/week  Follow Up Recommendations SNF;Supervision/Assistance - 24 hour  PT equipment None recommended by PT  PT Goal Progression  Progress towards PT goals Not progressing toward goals - comment (due to impaired cognition)  PT General Charges  $$ ACUTE PT VISIT 1 Procedure  PT Treatments  $Gait Training 8-22 mins   Lewis Shock, PT, DPT Pager #: [phone] Office #: [phone]

## 2015-04-11 ENCOUNTER — Encounter (HOSPITAL_COMMUNITY): Payer: Self-pay | Admitting: Emergency Medicine

## 2015-04-11 ENCOUNTER — Inpatient Hospital Stay (HOSPITAL_COMMUNITY)
Admission: EM | Admit: 2015-04-11 | Discharge: 2015-04-18 | DRG: 535 | Disposition: A | Discharge status: Hospice | Payer: Medicare Other | Attending: Internal Medicine | Admitting: Internal Medicine

## 2015-04-11 DIAGNOSIS — D539 Nutritional anemia, unspecified: Secondary | ICD-10-CM | POA: Diagnosis present

## 2015-04-11 DIAGNOSIS — S72009A Fracture of unspecified part of neck of unspecified femur, initial encounter for closed fracture: Secondary | ICD-10-CM

## 2015-04-11 DIAGNOSIS — M1990 Unspecified osteoarthritis, unspecified site: Secondary | ICD-10-CM | POA: Diagnosis present

## 2015-04-11 DIAGNOSIS — F1020 Alcohol dependence, uncomplicated: Secondary | ICD-10-CM | POA: Diagnosis present

## 2015-04-11 DIAGNOSIS — R410 Disorientation, unspecified: Secondary | ICD-10-CM

## 2015-04-11 DIAGNOSIS — R52 Pain, unspecified: Secondary | ICD-10-CM

## 2015-04-11 DIAGNOSIS — Q676 Pectus excavatum: Secondary | ICD-10-CM

## 2015-04-11 DIAGNOSIS — I6529 Occlusion and stenosis of unspecified carotid artery: Secondary | ICD-10-CM | POA: Diagnosis present

## 2015-04-11 DIAGNOSIS — Z885 Allergy status to narcotic agent status: Secondary | ICD-10-CM

## 2015-04-11 DIAGNOSIS — G629 Polyneuropathy, unspecified: Secondary | ICD-10-CM | POA: Diagnosis present

## 2015-04-11 DIAGNOSIS — I808 Phlebitis and thrombophlebitis of other sites: Secondary | ICD-10-CM | POA: Diagnosis present

## 2015-04-11 DIAGNOSIS — E210 Primary hyperparathyroidism: Secondary | ICD-10-CM | POA: Diagnosis present

## 2015-04-11 DIAGNOSIS — K219 Gastro-esophageal reflux disease without esophagitis: Secondary | ICD-10-CM | POA: Diagnosis present

## 2015-04-11 DIAGNOSIS — Z96643 Presence of artificial hip joint, bilateral: Secondary | ICD-10-CM | POA: Diagnosis present

## 2015-04-11 DIAGNOSIS — Z9114 Patient's other noncompliance with medication regimen: Secondary | ICD-10-CM | POA: Diagnosis present

## 2015-04-11 DIAGNOSIS — Z96612 Presence of left artificial shoulder joint: Secondary | ICD-10-CM | POA: Diagnosis present

## 2015-04-11 DIAGNOSIS — K7030 Alcoholic cirrhosis of liver without ascites: Secondary | ICD-10-CM | POA: Diagnosis present

## 2015-04-11 DIAGNOSIS — M4804 Spinal stenosis, thoracic region: Secondary | ICD-10-CM | POA: Diagnosis present

## 2015-04-11 DIAGNOSIS — R296 Repeated falls: Secondary | ICD-10-CM | POA: Diagnosis present

## 2015-04-11 DIAGNOSIS — S42253A Displaced fracture of greater tuberosity of unspecified humerus, initial encounter for closed fracture: Secondary | ICD-10-CM | POA: Insufficient documentation

## 2015-04-11 DIAGNOSIS — Z96611 Presence of right artificial shoulder joint: Secondary | ICD-10-CM | POA: Diagnosis present

## 2015-04-11 DIAGNOSIS — M7989 Other specified soft tissue disorders: Secondary | ICD-10-CM

## 2015-04-11 DIAGNOSIS — E860 Dehydration: Secondary | ICD-10-CM | POA: Diagnosis present

## 2015-04-11 DIAGNOSIS — G8929 Other chronic pain: Secondary | ICD-10-CM | POA: Diagnosis present

## 2015-04-11 DIAGNOSIS — K7290 Hepatic failure, unspecified without coma: Secondary | ICD-10-CM | POA: Diagnosis present

## 2015-04-11 DIAGNOSIS — Y92122 Bedroom in nursing home as the place of occurrence of the external cause: Secondary | ICD-10-CM

## 2015-04-11 DIAGNOSIS — Z79899 Other long term (current) drug therapy: Secondary | ICD-10-CM

## 2015-04-11 DIAGNOSIS — S42252A Displaced fracture of greater tuberosity of left humerus, initial encounter for closed fracture: Secondary | ICD-10-CM

## 2015-04-11 DIAGNOSIS — F17210 Nicotine dependence, cigarettes, uncomplicated: Secondary | ICD-10-CM | POA: Diagnosis present

## 2015-04-11 DIAGNOSIS — B850 Pediculosis due to Pediculus humanus capitis: Secondary | ICD-10-CM | POA: Diagnosis present

## 2015-04-11 DIAGNOSIS — W06XXXA Fall from bed, initial encounter: Secondary | ICD-10-CM | POA: Diagnosis present

## 2015-04-11 DIAGNOSIS — W1830XA Fall on same level, unspecified, initial encounter: Secondary | ICD-10-CM | POA: Diagnosis not present

## 2015-04-11 DIAGNOSIS — F319 Bipolar disorder, unspecified: Secondary | ICD-10-CM | POA: Diagnosis present

## 2015-04-11 DIAGNOSIS — Z981 Arthrodesis status: Secondary | ICD-10-CM

## 2015-04-11 DIAGNOSIS — E0781 Sick-euthyroid syndrome: Secondary | ICD-10-CM | POA: Diagnosis present

## 2015-04-11 DIAGNOSIS — Z515 Encounter for palliative care: Secondary | ICD-10-CM

## 2015-04-11 DIAGNOSIS — T84041A Periprosthetic fracture around internal prosthetic left hip joint, initial encounter: Secondary | ICD-10-CM | POA: Diagnosis present

## 2015-04-11 DIAGNOSIS — F0390 Unspecified dementia without behavioral disturbance: Secondary | ICD-10-CM | POA: Diagnosis present

## 2015-04-11 DIAGNOSIS — F3132 Bipolar disorder, current episode depressed, moderate: Secondary | ICD-10-CM | POA: Diagnosis present

## 2015-04-11 DIAGNOSIS — Z881 Allergy status to other antibiotic agents status: Secondary | ICD-10-CM

## 2015-04-11 DIAGNOSIS — A419 Sepsis, unspecified organism: Secondary | ICD-10-CM

## 2015-04-11 DIAGNOSIS — E440 Moderate protein-calorie malnutrition: Secondary | ICD-10-CM | POA: Insufficient documentation

## 2015-04-11 DIAGNOSIS — I10 Essential (primary) hypertension: Secondary | ICD-10-CM | POA: Diagnosis present

## 2015-04-11 DIAGNOSIS — L409 Psoriasis, unspecified: Secondary | ICD-10-CM | POA: Diagnosis present

## 2015-04-11 DIAGNOSIS — S7292XA Unspecified fracture of left femur, initial encounter for closed fracture: Secondary | ICD-10-CM | POA: Diagnosis present

## 2015-04-11 DIAGNOSIS — S72115A Nondisplaced fracture of greater trochanter of left femur, initial encounter for closed fracture: Principal | ICD-10-CM | POA: Diagnosis present

## 2015-04-11 DIAGNOSIS — Z7982 Long term (current) use of aspirin: Secondary | ICD-10-CM

## 2015-04-11 DIAGNOSIS — J690 Pneumonitis due to inhalation of food and vomit: Secondary | ICD-10-CM | POA: Diagnosis present

## 2015-04-11 DIAGNOSIS — R509 Fever, unspecified: Secondary | ICD-10-CM

## 2015-04-11 DIAGNOSIS — Z66 Do not resuscitate: Secondary | ICD-10-CM | POA: Diagnosis present

## 2015-04-11 DIAGNOSIS — Z6822 Body mass index (BMI) 22.0-22.9, adult: Secondary | ICD-10-CM

## 2015-04-11 DIAGNOSIS — Y792 Prosthetic and other implants, materials and accessory orthopedic devices associated with adverse incidents: Secondary | ICD-10-CM | POA: Diagnosis present

## 2015-04-11 DIAGNOSIS — Z79891 Long term (current) use of opiate analgesic: Secondary | ICD-10-CM

## 2015-04-11 LAB — RENAL FUNCTION PANEL
ANION GAP: 8 (ref 5–15)
Albumin: 2.9 g/dL — ABNORMAL LOW (ref 3.5–5.0)
BUN: 12 mg/dL (ref 6–20)
CHLORIDE: 109 mmol/L (ref 101–111)
CO2: 23 mmol/L (ref 22–32)
Calcium: 11.5 mg/dL — ABNORMAL HIGH (ref 8.9–10.3)
Creatinine, Ser: 0.97 mg/dL (ref 0.61–1.24)
GFR calc Af Amer: >60 mL/min (ref >60)
GLUCOSE: 95 mg/dL (ref 65–99)
PHOSPHORUS: 2.8 mg/dL (ref 2.5–4.6)
POTASSIUM: 3.9 mmol/L (ref 3.5–5.1)
Sodium: 140 mmol/L (ref 135–145)

## 2015-04-11 LAB — CBC
HEMATOCRIT: 26.5 % — AB (ref 39.0–52.0)
HEMOGLOBIN: 8.6 g/dL — AB (ref 13.0–17.0)
MCH: 33.2 pg (ref 26.0–34.0)
MCHC: 32.5 g/dL (ref 30.0–36.0)
MCV: 102.3 fL — AB (ref 78.0–100.0)
Platelets: 193 10*3/uL (ref 150–400)
RBC: 2.59 MIL/uL — ABNORMAL LOW (ref 4.22–5.81)
RDW: 12.5 % (ref 11.5–15.5)
WBC: 8.2 10*3/uL (ref 4.0–10.5)

## 2015-04-11 MED ORDER — MORPHINE SULFATE (PF) 4 MG/ML IV SOLN
4.0000 mg | Freq: Once | INTRAVENOUS | Status: AC
  Administered 2015-04-11: 4 mg via INTRAVENOUS
  Filled 2015-04-11: qty 1

## 2015-04-11 MED ORDER — AMOXICILLIN-POT CLAVULANATE 875-125 MG PO TABS: 1.0000 | ORAL_TABLET | Freq: Two times a day (BID) | ORAL | Status: DC

## 2015-04-11 MED ORDER — PANTOPRAZOLE SODIUM 40 MG PO TBEC: 40.0000 mg | DELAYED_RELEASE_TABLET | ORAL | Status: DC

## 2015-04-11 MED ORDER — OXYCODONE HCL 5 MG PO TABS: 5.0000 mg | ORAL_TABLET | ORAL | Status: DC | PRN

## 2015-04-11 MED FILL — Sodium Chloride IV Soln 0.9%: INTRAVENOUS | Qty: 1000 | Status: AC

## 2015-04-11 MED FILL — Heparin Sodium (Porcine) Inj 1000 Unit/ML: INTRAMUSCULAR | Qty: 30 | Status: AC

## 2015-04-11 NOTE — Progress Notes (Signed)
Discharge orders received, Pt for discharge to SNF [facility]. IV d/c'd. D/c instructions and RX placed in packet with called report to nurse at [facility].Patient transferred [facility]. 04/11/15 1555

## 2015-04-11 NOTE — Discharge Summary (Signed)
Physician Discharge Summary  [name]:[id number] DOB: 07-18-1961 DOA: 04/03/2015  PCP: Arlan Organ., MD  Admit date: 04/03/2015 Discharge date: 04/11/2015  Time spent: 40 minutes  Recommendations for Outpatient Follow-up:  1. Follow up with nursing home M.D. 2. Augmentin for 3 more days.  Discharge Diagnoses:  Principal Problem:   Back pain Active Problems:   Bipolar disorder   Bipolar 1 disorder, depressed, moderate   Lumbago   Tobacco abuse   Uncomplicated alcohol dependence   Other pancytopenia   Lumbar stenosis with neurogenic claudication   Discharge Condition: Stable  Diet recommendation: Heart healthy  Filed Weights   04/04/15 0800  Weight: 70 kg (154 lb 5.2 oz)    History of present illness:  [name] is a 54 y.o. male  With history of multiple back surgeries who presents today complaining of lower back discomfort. He is taking pain medication at home but he reports it has not been helping and as a result he has been less active due to the discomfort. He denies any decrease in strength or bowel or bladder incontinence. Mainly states his problems are related to his pain. As a result he presented to the hospital for further evaluation recommendations. While here there is concerns that his wife is unable to care for him at home.  We were subsequently consulted for further medical evaluation recommendations  Hospital Course:   Acute on chronic low back pain with sciatica - Reviewed MRI L-spine report from 8/24-results as below. 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 - Seen by Dr. Newell Coral of neurosurgery, surgery is done 04/06/2015 [facility] with physical therapy, recommend to SNF. - Seen excessive pain.  Altered mental status -After the surgery patient developed lethargy for one day, after the lethargy improved patient developed  severe disorientation. -Patient disorientation looked similar to the alcohol withdrawal confabulation. Clonidine discontinued. -Patient improved today, maintained more sense, was able to carry a conversation and ate about 25-30% of his meal.  Alcohol abuse/hepatitis C/cirrhosis - Patient continues to drink up to 4 beers daily and vodka occasionally. - CIWA protocol, initiated after surgery.  Hypercalcemia - May be related to dehydration. Brief IV normal saline hydration and follow. Check renal function panel in a.m.  Pancytopenia/macrocytosis - Likely related to ongoing alcohol abuse related bone marrow toxicity and cirrhosis. - Follow CBCs. Alcohol abstinence.  Tobacco abuse - Cessation counseled. Nicotine patch.  Essential hypertension - Controlled off medications.  Bipolar disorder - Patient on lithium at home-continue. Lithium levels low limits of normal.  Psoriasis - Patient has extensive skin psoriatic lesions on his back, limbs. Outpatient follow-up.  Fever -Patient did have a low-grade fever, maximum temperature was 100.4, unclear if this is secondary to alcohol withdrawal. -Because patient vomited once while was in the hospital place and Zosyn. -On discharge Augmentin for 3 more days to complete 5 days of antibiotics.   Procedures: Procedure(s): Explantation of ISOLA instrumentation at L5-6; L4 and L5 decompressive lumbar laminectomy, bilateral L4-5 facetectomy, foraminotomies for decompression of stenotic compression of the exiting L4 and L5 nerve roots bilaterally, with decompression beyond that required for interbody arthrodesis; L4-5 posterior lumbar interbody arthrodesis with AVS peek interbody implants, Vitoss BA with bone marrow aspirate, and infuse; bilateral L4-5 posterior lateral arthrodesis with nonsegmental radius posterior instrumentation, locally harvested morcellized autograft, Vitoss BA with bone marrow aspirate, and  infuse  Consultations:  Surgery  Discharge Exam: Filed Vitals:   04/11/15 1337  BP: 159/98  Pulse: 91  Temp: 99.9 F (37.7 C)  Resp: 20   General: Alert and awake, oriented x3, not in any acute distress. HEENT: anicteric sclera, pupils reactive to light and accommodation, EOMI CVS: S1-S2 clear, no murmur rubs or gallops Chest: clear to auscultation bilaterally, no wheezing, rales or rhonchi Abdomen: soft nontender, nondistended, normal bowel sounds, no organomegaly Extremities: no cyanosis, clubbing or edema noted bilaterally Neuro: Cranial nerves II-XII intact, no focal neurological deficits  Discharge Instructions   Discharge Instructions    Diet - low sodium heart healthy    Complete by:  As directed      Increase activity slowly    Complete by:  As directed           Current Discharge Medication List    START taking these medications   Details  amoxicillin-clavulanate (AUGMENTIN) 875-125 MG per tablet Take 1 tablet by mouth 2 (two) times daily. Qty: 6 tablet, Refills: 0    oxyCODONE (OXY IR/ROXICODONE) 5 MG immediate release tablet Take 1-2 tablets (5-10 mg total) by mouth every 4 (four) hours as needed for moderate pain or breakthrough pain. Qty: 10 tablet, Refills: 0      CONTINUE these medications which have NOT CHANGED   Details  aspirin 81 MG tablet Take 81 mg by mouth daily.    augmented betamethasone dipropionate (DIPROLENE-AF) 0.05 % ointment Apply 1 application topically 2 (two) times daily. Refills: 5    clobetasol (TEMOVATE) 0.05 % external solution Apply 1 application topically every 7 (seven) days.    clobetasol ointment (TEMOVATE) 0.05 % Apply 1 application topically daily as needed. Apply affected areas for psoriasis    fluticasone (CUTIVATE) 0.005 % ointment Apply 1 application topically 2 (two) times daily. Refills: 5    lithium carbonate 300 MG capsule Take 900 mg by mouth at bedtime.     oxyCODONE-acetaminophen (PERCOCET/ROXICET)  5-325 MG per tablet Take 1 tablet by mouth every 6 (six) hours as needed for moderate pain.  Refills: 0    traZODone (DESYREL) 100 MG tablet Take 200 mg by mouth at bedtime.    acetaminophen (TYLENOL) 325 MG tablet Take 2 tablets (650 mg total) by mouth every 4 (four) hours as needed.    amLODipine (NORVASC) 5 MG tablet Take 2 tablets (10 mg total) by mouth daily. Qty: 60 tablet, Refills: 0    omeprazole (PRILOSEC) 20 MG capsule Take 1 capsule (20 mg total) by mouth 2 (two) times daily. Qty: 60 capsule, Refills: 1      STOP taking these medications     cloNIDine (CATAPRES) 0.3 MG tablet        Allergies  Allergen Reactions  . Prochlorperazine Maleate Other (See Comments)    REACTION: jaw lock  . Erythromycin Other (See Comments)    REACTION: GI upset  . Hydromorphone Hcl Itching    REACTION: itching   Follow-up Information    Follow up with MANNING, Adelene Amas., MD In 1 week.   Specialty:  Family Medicine   Contact information:   [address] [place] [address] [phone]        The results of significant diagnostics from this hospitalization (including imaging, microbiology, ancillary and laboratory) are listed below for reference.    Significant Diagnostic Studies: Dg Chest 2 View  04/08/2015   CLINICAL DATA:  Fever.  EXAM: CHEST  2 VIEW  COMPARISON:  04/03/2015.  FINDINGS: The cardiac  silhouette remains borderline enlarged. Clear lungs. Bilateral shoulder prostheses.  IMPRESSION: No acute abnormality.   Electronically Signed   By: Beckie Salts M.D.   On: 04/08/2015 15:44   Dg Chest 2 View  04/04/2015   CLINICAL DATA:  Status post fall from chair, with weakness and shortness of breath. Initial encounter.  EXAM: CHEST  2 VIEW  COMPARISON:  Chest radiograph performed 03/12/2013  FINDINGS: The lungs are well-aerated and clear. There is no evidence of focal opacification, pleural effusion or pneumothorax.  The heart is borderline normal in size. No acute osseous  abnormalities are seen. Bilateral shoulder arthroplasties are noted. Cervical spinal fusion hardware is partially imaged.  IMPRESSION: No acute cardiopulmonary process seen. No displaced rib fractures identified.   Electronically Signed   By: Roanna Raider M.D.   On: 04/04/2015 00:08   Dg Cervical Spine With Flex & Extend  04/04/2015   CLINICAL DATA:  Multiple back surgeries with recent fall 04/03/2015. Patient now complains of neck and shoulder pain as well as low back pain.  EXAM: CERVICAL SPINE COMPLETE WITH FLEXION AND EXTENSION VIEWS  COMPARISON:  02/16/2013  FINDINGS: Exam demonstrates mild spondylosis throughout the cervical spine. Anterior fusion hardware is present and intact from C2-C6. Facet arthropathy and uncovertebral spurring are present. Prevertebral soft tissues are within normal. Flexion and extension views are within normal without evidence of instability. Atlantoaxial articulation is within normal. Moderate left-sided neural foraminal narrowing at the C5-6 and C6-7 levels with mild right-sided neural foraminal narrowing at the C4-5 and C5-6 levels.  IMPRESSION: Mild spondylosis of the cervical spine with bilateral neural foraminal narrowing as described.  Anterior fusion hardware from C2-C6 intact. No instability on flexion and extension.   Electronically Signed   By: Elberta Fortis M.D.   On: 04/04/2015 11:53   Dg Lumbar Spine 2-3 Views  04/06/2015   CLINICAL DATA:  Intraoperative fluoroscopy for posterior lumbar interbody fusion at L4-5 with hardware removal.  EXAM: LUMBAR SPINE - 2-3 VIEW; DG C-ARM 61-120 MIN  COMPARISON:  04/06/2015 portable cross-table intraoperative lumbar spine radiograph.  FINDINGS: Two nondiagnostic intraoperative spot fluoroscopic images of the lumbar spine were provided. Fluoroscopy time 27 seconds. Bone cage markers are present in the L4-5 disc space. Bilateral posterior approach pedicle screws are noted at L4 and L5.  IMPRESSION: Intraoperative fluoroscopy for  posterior lumbar interbody fusion at L4-5.   Electronically Signed   By: Delbert Phenix M.D.   On: 04/06/2015 12:53   Dg Lumbar Spine Complete  03/29/2015   CLINICAL DATA:  Bilateral leg pain.  Recent bilateral leg weakness.  EXAM: LUMBAR SPINE - COMPLETE 4+ VIEW  COMPARISON:  None.  FINDINGS: Posterior fusion changes at L4-5. Solid bony fusion across the disc space. No hardware complicating feature. No malalignment or fracture.  IMPRESSION: Postoperative changes in the lower lumbar spine. No acute bony abnormality.   Electronically Signed   By: Charlett Nose M.D.   On: 03/29/2015 10:09   Mr Lumbar Spine Wo Contrast  03/29/2015   CLINICAL DATA:  Low back pain and bilateral leg pain and burning, month duration. Previous fusion surgery.  EXAM: MRI LUMBAR SPINE WITHOUT CONTRAST  TECHNIQUE: Multiplanar, multisequence MR imaging of the lumbar spine was performed. No intravenous contrast was administered.  COMPARISON:  Radiography same day.  FINDINGS: T11-12: Normal.  T12-L1: Inferior endplate Schmorl's node at T12 with regional edema. This could be associated with back pain. Minimal bulging of the disc. No apparent neural compression.  L1-2:  Mild  desiccation of the disc.  No stenosis.  L2-3:  Normal interspace.  L3-4: Disc degeneration with circumferential protrusion of disc material. Facet degeneration with facet and ligamentous hypertrophy. Severe spinal stenosis at this level that could be symptomatic.  L4-5: Distant decompression and fusion. Wide patency of the canal and foramina.  L5-S1:  No disc pathology.  Mild facet degeneration.  No stenosis.  IMPRESSION: Previous decompression and fusion at L4-5.  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  Inferior endplate Schmorl's node at T12 with associated marrow edema. This could be a cause of back pain.   Electronically Signed   By: Paulina Fusi M.D.   On: 03/29/2015 15:15   Dg Lumbar Spine 1  View  04/06/2015   CLINICAL DATA:  Posterior fusion  EXAM: LUMBAR SPINE - 1 VIEW  COMPARISON:  MR lumbar spine of 03/29/2015  FINDINGS: A cross-table lateral portable view of the lumbar spine from the operating room shows hardware for fusion at L4-5. Alignment of the lumbar vertebrae is normal. A needle is positioned beneath the spinous process of L5 directed toward the L5-S1 interspace.  IMPRESSION: Needle directed to L5-S1 interspace. Hardware is present for posterior fusion at L4-5.   Electronically Signed   By: Dwyane Dee M.D.   On: 04/06/2015 12:18   Dg Chest Port 1 View  04/09/2015   CLINICAL DATA:  Fever. Status post back surgery 3 weeks ago. Cough. Vomited this morning.  EXAM: PORTABLE CHEST - 1 VIEW  COMPARISON:  Yesterday.  FINDINGS: The cardiac silhouette remains borderline enlarged. Clear lungs. Bilateral shoulder prostheses are again noted.  IMPRESSION: No acute abnormality.   Electronically Signed   By: Beckie Salts M.D.   On: 04/09/2015 09:55   Dg Abd Portable 1v  04/09/2015   CLINICAL DATA:  54 year old male with vomiting and without bowel movement.  EXAM: PORTABLE ABDOMEN - 1 VIEW  COMPARISON:  None.  FINDINGS: Evaluation of this exam is limited due to respiratory motion artifact. There is mild gaseous distention of the stomach. Moderate stool and air are noted throughout the colon. There is no evidence of bowel dilatation. There is degenerative changes of the spine. L4-L5 fixation screws noted. No acute fracture.  IMPRESSION: No evidence of bowel obstruction.   Electronically Signed   By: Elgie Collard M.D.   On: 04/09/2015 19:58   Dg C-arm 1-60 Min  04/06/2015   CLINICAL DATA:  Intraoperative fluoroscopy for posterior lumbar interbody fusion at L4-5 with hardware removal.  EXAM: LUMBAR SPINE - 2-3 VIEW; DG C-ARM 61-120 MIN  COMPARISON:  04/06/2015 portable cross-table intraoperative lumbar spine radiograph.  FINDINGS: Two nondiagnostic intraoperative spot fluoroscopic images of the  lumbar spine were provided. Fluoroscopy time 27 seconds. Bone cage markers are present in the L4-5 disc space. Bilateral posterior approach pedicle screws are noted at L4 and L5.  IMPRESSION: Intraoperative fluoroscopy for posterior lumbar interbody fusion at L4-5.   Electronically Signed   By: Delbert Phenix M.D.   On: 04/06/2015 12:53    Microbiology: Recent Results (from the past 240 hour(s))  Surgical pcr screen     Status: Abnormal   Collection Time: 04/05/15 11:19 PM  Result Value Ref Range Status   MRSA, PCR NEGATIVE NEGATIVE Final   Staphylococcus aureus POSITIVE (A) NEGATIVE Final    Comment:        The Xpert SA Assay (FDA approved for NASAL specimens in patients over 14 years of age), is  one component of a comprehensive surveillance program.  Test performance has been validated by [facility] for patients greater than or equal to 49 year old. It is not intended to diagnose infection nor to guide or monitor treatment.   Culture, blood (routine x 2)     Status: None (Preliminary result)   Collection Time: 04/07/15  8:17 AM  Result Value Ref Range Status   Specimen Description BLOOD LEFT ARM  Final   Special Requests BOTTLES DRAWN AEROBIC AND ANAEROBIC 10CC  Final   Culture NO GROWTH 3 DAYS  Final   Report Status PENDING  Incomplete  Culture, blood (routine x 2)     Status: None (Preliminary result)   Collection Time: 04/07/15  8:25 AM  Result Value Ref Range Status   Specimen Description BLOOD LEFT ARM  Final   Special Requests BOTTLES DRAWN AEROBIC AND ANAEROBIC 10CC  Final   Culture NO GROWTH 3 DAYS  Final   Report Status PENDING  Incomplete  Urine culture     Status: None   Collection Time: 04/07/15 12:10 PM  Result Value Ref Range Status   Specimen Description URINE, RANDOM  Final   Special Requests NONE  Final   Culture NO GROWTH 1 DAY  Final   Report Status 04/08/2015 FINAL  Final     Labs: Basic Metabolic Panel:  Recent Labs Lab 04/07/15 0359  04/08/15 0421 04/09/15 1220 04/10/15 0504 04/11/15 0630  NA 136 135 137 138 140  K 4.5 4.8 4.4 4.0 3.9  CL 102 102 109 108 109  CO2 GLUCOSE 108* 107* 147* 108* 95  BUN CREATININE 0.94 0.95 0.85 1.12 0.97  CALCIUM 9.8 10.6* 11.6* 11.9* 11.5*  PHOS  --   --   --  1.2* 2.8   Liver Function Tests:  Recent Labs Lab 04/05/15 0440 04/07/15 0359 04/08/15 0421 04/10/15 0504 04/11/15 0630  AST 68* 60* 54*  --   --   ALT 83* 59 50  --   --   ALKPHOS 82 64 69  --   --   BILITOT 1.2 1.1 1.3*  --   --   PROT 5.8* 5.1* 5.6*  --   --   ALBUMIN 3.2* 2.6* 2.7* 2.7* 2.9*   No results for input(s): LIPASE, AMYLASE in the last 168 hours.  Recent Labs Lab 04/07/15 1210  AMMONIA 28   CBC:  Recent Labs Lab 04/05/15 0440 04/07/15 0359 04/08/15 0421 04/09/15 1220 04/11/15 0630  WBC 4.0 5.2 7.3 8.8 8.2  NEUTROABS  --   --   --  7.1  --   HGB 11.2* 8.2* 8.6* 8.2* 8.6*  HCT 33.9* 25.3* 26.9* 25.5* 26.5*  MCV 104.6* 105.0* 106.7* 104.5* 102.3*  PLT 87* 97* 116* 157 193   Cardiac Enzymes: No results for input(s): CKTOTAL, CKMB, CKMBINDEX, TROPONINI in the last 168 hours. BNP: BNP (last 3 results) No results for input(s): BNP in the last 8760 hours.  ProBNP (last 3 results) No results for input(s): PROBNP in the last 8760 hours.  CBG: No results for input(s): GLUCAP in the last 168 hours.     Signed:  Adalind Weitz A  [facility] 04/11/2015, 1:59 PM

## 2015-04-11 NOTE — ED Notes (Signed)
MD at bedside. 

## 2015-04-11 NOTE — Progress Notes (Signed)
Physical Therapy Treatment Patient Details Name: [name] MRN: [id number] DOB: May 28, 1961 Today's Date: 04/11/2015    History of Present Illness 54 yo male admitted with back pain. Hx of peripheral neuropathy, tachycardia, bipolar, avascular necrosis, Hep C, L4-L5 fusion, L3-L4 spinal stenosis; now s/p L4-5 laminectomy and PLIF.    PT Comments    Patient progressing very slowly towards PT goals. Continues to require assist of 2 for safe mobility due to confusion and cognitive deficits. Pt with poor safety awareness/judgment and difficulty sequencing/initiating movements. Improved ambulation distance today with max cues and assist. Pt very high falls risk. Will follow acutely per current POC to maximize independence and mobility.   Follow Up Recommendations  SNF;Supervision/Assistance - 24 hour     Equipment Recommendations  Rolling walker with 5" wheels    Recommendations for Other Services       Precautions / Restrictions Precautions Precautions: Fall;Back Precaution Booklet Issued: No Precaution Comments: pt unable to comprehend precautions. pt with significant noted bruising Required Braces or Orthoses: Spinal Brace Spinal Brace: Lumbar corset;Applied in sitting position Restrictions Weight Bearing Restrictions: No    Mobility  Bed Mobility Overal bed mobility: Needs Assistance Bed Mobility: Supine to Sit;Sit to Supine     Supine to sit: Min assist Sit to supine: Min assist   General bed mobility comments: Pt spontaneous with movement and requires Min A for safety and appropriate technique. Despite max cues for log roll technique, pt sitting straight up-difficulty following commands.  Assist to bring RLE into bed.   Transfers Overall transfer level: Needs assistance Equipment used: 2 person hand held assist;Rolling walker (2 wheeled) Transfers: Sit to/from Stand Sit to Stand: Mod assist;+2 physical assistance;+2 safety/equipment         General transfer  comment: Required multiple attempts due to pain and inability to follow commands. Pt reaching for IV pole to pull up to standing, max multimodal cues (mostly manual) for hand placement. Stood from [employer], from chair x1.   Ambulation/Gait Ambulation/Gait assistance: Mod assist;+2 physical assistance;+2 safety/equipment Ambulation Distance (Feet): 40 Feet (x2 bouts) Assistive device: Rolling walker (2 wheeled);2 person hand held assist Gait Pattern/deviations: Step-to pattern;Decreased stride length;Trunk flexed;Staggering left;Staggering right;Decreased stance time - right;Decreased step length - left Gait velocity: slow Gait velocity interpretation: <1.8 ft/sec, indicative of risk for recurrent falls General Gait Details: Knee instability noted bilaterally RLE>LLE. Max verbal cues for sequencing. Assist with RW management/navigation. Poor safety awareness. Therapist and tech having to manually sit pt down in chair for rest break - poor initiation/sequencing of movements.  Attempted ambulation with HHA x2 however very unsafe with pt continually trying to grab IV pole progressed to using RW.    Stairs            Wheelchair Mobility    Modified Rankin (Stroke Patients Only)       Balance Overall balance assessment: Needs assistance Sitting-balance support: Feet supported;No upper extremity supported Sitting balance-Leahy Scale: Fair Sitting balance - Comments: pt able to sit EOB unsupported however restless/impulsive requiring minA for safety.   Standing balance support: During functional activity;Bilateral upper extremity supported Standing balance-Leahy Scale: Poor                      Cognition Arousal/Alertness: Awake/alert Behavior During Therapy: Impulsive;Restless Overall Cognitive Status: Impaired/Different from baseline Area of Impairment: Orientation;Following commands;Memory;Safety/judgement;Awareness;Problem solving Orientation Level: Disoriented  to;Place;Time;Situation   Memory: Decreased short-term memory;Decreased recall of precautions Following Commands: Follows one step commands inconsistently Safety/Judgement: Decreased  awareness of safety;Decreased awareness of deficits Awareness: Emergent Problem Solving: Requires verbal cues;Slow processing;Decreased initiation General Comments: Pt following ~70% of commands with increased cues. Limited by confusion.    Exercises      General Comments General comments (skin integrity, edema, etc.): Bruising to back and LLE/groin region. Perseverating on trying to call his wife and trying to state phone number.      Pertinent Vitals/Pain Pain Assessment: Faces Faces Pain Scale: Hurts even more Pain Location: unable to state but grimaces with movement Pain Descriptors / Indicators: Grimacing Pain Intervention(s): Monitored during session    Home Living                      Prior Function            PT Goals (current goals can now be found in the care plan section) Progress towards PT goals: Progressing toward goals    Frequency  Min 5X/week    PT Plan Current plan remains appropriate    Co-evaluation             End of Session Equipment Utilized During Treatment: Gait belt Activity Tolerance: Patient tolerated treatment well;Patient limited by fatigue Patient left: in bed;with call bell/phone within reach;with bed alarm set;with nursing/sitter in room     Time: 1300-1323 PT Time Calculation (min) (ACUTE ONLY): 23 min  Charges:  $Gait Training: 8-22 mins $Therapeutic Activity: 8-22 mins                    G Codes:      Melinna Linarez A Rosalva Neary 04/11/2015, 2:04 PM  Mylo Red, PT, DPT [phone]

## 2015-04-11 NOTE — ED Notes (Signed)
Per EMS: pt comes from [place] following a fall while getting out of bed to ask for more pain meds.  Pt was admitted to [facility] last week for back surgery (slipped disc) and was discharged and transferred to [facility] today.  Pt states he fell to his knees because of the pain in his back and then buckled to the floor - denies hitting head, no LOC.  Pt has hx bipolar.  Pt A&O x 4 at this time, NAD.  EMS VS: 150/90, 100 ST, RR 16, CBG 131.  Pt states he was given  oxycodone earlier today but needed more for the back pain.

## 2015-04-11 NOTE — ED Provider Notes (Signed)
CSN: [id number]     Arrival date & time 04/11/15  2259 History  This chart was scribed for Tomasita Crumble, MD by Tanda Rockers, ED Scribe. This patient was seen in room A01C/A01C and the patient's care was started at 11:11 PM.  Chief Complaint  Patient presents with  . Fall   The history is provided by the patient. No language interpreter was used.     HPI Comments: [name] is a 54 y.o. male brought in by ambulance, who presents to the Emergency Department complaining of worsening back pain s/p unwitnessed ground level fall that occurred earlier tonight. Pt was at [facility] earlier today after having back surgery last week. He was getting out of bed to get more pain meidcation when his knees buckled, causing him to fall and worsen his back pain. He denies head injury or LOC. Denies hip pain, urinary or bowel incontinence, or any other associated symptoms. Pt is not currently on anti coagulants.    Past Medical History  Diagnosis Date  . Tachycardia   . Peripheral neuropathy   . Alcohol abuse     hx of quit 4/04  . Bilateral external ear infections 2007  . Tobacco abuse   . Bipolar affective disorder     Followed by Dr. [name]  . Avascular necrosis     SP right and left hip replacements and L shoulder arthroplasty, secondary to steroid use.  Has been on embrel in past.  . Psoriasis     Previously on embrel, stopped because of price.   . Hypertension   . Hepatitis C   . ARF (acute renal failure) 2008    Multifactorial in setting of over use of ibuprofen and HCTZ./ACEI  . Cirrhosis   . Back pain   . Pneumonia     hx  . Heart murmur   . Stroke 11    ? "mini stroke"  . GERD (gastroesophageal reflux disease)     occ  . Arthritis   . Pharyngeal edema 03/12/2013   Past Surgical History  Procedure Laterality Date  . Right total hip replacement  2004    Due to AVN  . Left total hip replacement  2001    Due to AVN  . Closed manipulation, pinning,  application of a traction pin right  2002  . Right shoulder arthroplasty      Due to AVN  . Appendectomy  1989  . Joint replacement Left 02    shoulder  . Tonsillectomy    . Hernia repair    . Anterior fusion cervical spine  03/10/2013  . Anterior cervical decomp/discectomy fusion N/A 03/10/2013    Procedure: Cervical Two-Three, Three to Four, Cervical Four to Five, Cervical Five to Six anterior cervical decompression with fusion plating and bonegraft;  Surgeon: Hewitt Shorts, MD;  Location: [facility];  Service: Neurosurgery;  Laterality: N/A;  ANTERIOR CERVICAL DECOMPRESSION/DISCECTOMY FUSION 4 LEVELS  . Esophagogastroduodenoscopy N/A 04/11/2014    Procedure: ESOPHAGOGASTRODUODENOSCOPY (EGD);  Surgeon: Graylin Shiver, MD;  Location: [facility];  Service: Endoscopy;  Laterality: N/A;  . Esophagogastroduodenoscopy N/A 04/11/2014    Procedure: ESOPHAGOGASTRODUODENOSCOPY (EGD);  Surgeon: Graylin Shiver, MD;  Location: [facility];  Service: Endoscopy;  Laterality: N/A;   Family History  Problem Relation Age of Onset  . Hypertension Mother   . Bipolar disorder Father   . Obesity Father   . Obesity Brother    Social History  Substance Use Topics  . Smoking  status: Current Every Day Smoker -- 1.00 packs/day for 35 years    Types: Cigarettes  . Smokeless tobacco: Never Used     Comment: Not interested in stopping.      i want to quit togeather with my wife "  . Alcohol Use: No     Comment: Previously alcoholic, denies ETOH for "past few weeks"    Review of Systems  10 Systems reviewed and all are negative for acute change except as noted in the HPI.   Allergies  Prochlorperazine maleate; Erythromycin; and Hydromorphone hcl  Home Medications   Prior to Admission medications   Medication Sig Start Date End Date Taking? Authorizing Provider  acetaminophen (TYLENOL) 325 MG tablet Take 2 tablets (650 mg total) by mouth every 4 (four) hours as needed. Patient taking differently:  Take 650 mg by mouth every 4 (four) hours as needed.  03/16/13  Yes Marianne L York, PA-C  amLODipine (NORVASC) 5 MG tablet Take 2 tablets (10 mg total) by mouth daily. 03/16/13  Yes Tora Kindred York, PA-C  aspirin 81 MG tablet Take 81 mg by mouth daily.   Yes Historical Provider, MD  augmented betamethasone dipropionate (DIPROLENE-AF) 0.05 % ointment Apply 1 application topically 2 (two) times daily. 03/06/15  Yes Historical Provider, MD  clobetasol ointment (TEMOVATE) 0.05 % Apply 1 application topically daily as needed. Apply affected areas for psoriasis   Yes Historical Provider, MD  fluticasone (CUTIVATE) 0.005 % ointment Apply 1 application topically 2 (two) times daily. 03/06/15  Yes Historical Provider, MD  lithium carbonate 300 MG capsule Take 900 mg by mouth at bedtime.    Yes Historical Provider, MD  omeprazole (PRILOSEC) 20 MG capsule Take 1 capsule (20 mg total) by mouth 2 (two) times daily. 04/11/14  Yes Rolland Porter, MD  oxyCODONE (OXY IR/ROXICODONE) 5 MG immediate release tablet Take 1-2 tablets (5-10 mg total) by mouth every 4 (four) hours as needed for moderate pain or breakthrough pain. 04/11/15  Yes Clydia Llano, MD  oxyCODONE-acetaminophen (PERCOCET/ROXICET) 5-325 MG per tablet Take 1 tablet by mouth every 6 (six) hours as needed for moderate pain.  03/18/15  Yes Historical Provider, MD  traZODone (DESYREL) 100 MG tablet Take 200 mg by mouth at bedtime.   Yes Historical Provider, MD  amoxicillin-clavulanate (AUGMENTIN) 875-125 MG per tablet Take 1 tablet by mouth 2 (two) times daily. Patient not taking: Reported on 04/12/2015 04/11/15   Clydia Llano, MD   Triage Vitals: Pulse 78  Ht  (1.803 m)  Wt 160 lb (72.576 kg)  BMI 22.33 kg/m2  SpO2 100%   Physical Exam  Constitutional: He is oriented to person, place, and time. Vital signs are normal. He appears well-developed and well-nourished.  Non-toxic appearance. He does not appear ill. No distress.  HENT:  Head: Normocephalic and  atraumatic.  Nose: Nose normal.  Mouth/Throat: Oropharynx is clear and moist. No oropharyngeal exudate.  Eyes: Conjunctivae and EOM are normal. Pupils are equal, round, and reactive to light. No scleral icterus.  Neck: Normal range of motion. Neck supple. No tracheal deviation, no edema, no erythema and normal range of motion present. No thyroid mass and no thyromegaly present.  Cardiovascular: Normal rate, regular rhythm, S1 normal, S2 normal, normal heart sounds, intact distal pulses and normal pulses.  Exam reveals no gallop and no friction rub.   No murmur heard. Pulses:      Radial pulses are 2+ on the right side, and 2+ on the left side.  Dorsalis pedis pulses are 2+ on the right side, and 2+ on the left side.  Pulmonary/Chest: Effort normal and breath sounds normal. No respiratory distress. He has no wheezes. He has no rhonchi. He has no rales.  Abdominal: Soft. Normal appearance and bowel sounds are normal. He exhibits no distension, no ascites and no mass. There is no hepatosplenomegaly. There is no tenderness. There is no rebound, no guarding and no CVA tenderness.  Musculoskeletal: Normal range of motion. He exhibits tenderness. He exhibits no edema.  No tenderness to left hip Surgical site has sterile dressing over top; TTP in that area  Lymphadenopathy:    He has no cervical adenopathy.  Neurological: He is alert and oriented to person, place, and time. He has normal strength. No cranial nerve deficit or sensory deficit.  Normal strength and sensation to all extremities Normal cerebellar testing  Skin: Skin is warm, dry and intact. Rash noted. No petechiae noted. He is not diaphoretic. No erythema. No pallor.  Psoriatic rash to bilateral elbows, chest, back, and bilateral knees Significant bruising throughout thoracic back Bruising to left hip  Psychiatric: He has a normal mood and affect. His behavior is normal. Judgment normal.  Nursing note and vitals reviewed.   ED  Course  Procedures (including critical care time)  DIAGNOSTIC STUDIES: Oxygen Saturation is 100% on RA, normal by my interpretation.    COORDINATION OF CARE: 11:17 PM-Discussed treatment plan which includes DG L Pelvis, CT Head, CT L Spine, UA, Protime INR, APTT, CBC, and BMP with pt at bedside and pt agreed to plan.   Labs Review Labs Reviewed  CBC WITH DIFFERENTIAL/PLATELET - Abnormal; Notable for the following:    RBC 2.77 (*)    Hemoglobin 9.4 (*)    HCT 28.5 (*)    MCV 102.9 (*)    Neutrophils Relative % 78 (*)    All other components within normal limits  BASIC METABOLIC PANEL - Abnormal; Notable for the following:    Potassium 3.4 (*)    Glucose, Bld 114 (*)    Calcium 11.4 (*)    All other components within normal limits  PROTIME-INR  APTT  URINALYSIS, ROUTINE W REFLEX MICROSCOPIC (NOT AT [facility])    Imaging Review Ct Head Wo Contrast  04/12/2015   CLINICAL DATA:  54 year old male with fall  EXAM: CT HEAD WITHOUT CONTRAST  TECHNIQUE: Contiguous axial images were obtained from the base of the skull through the vertex without intravenous contrast.  COMPARISON:  Head CT dated 03/12/2013  FINDINGS: There is slight prominence of the sulci compatible with age-related volume loss. Minimal periventricular and deep white matter hypodensities represent chronic microvascular ischemic changes. There is no intracranial hemorrhage. No mass effect or midline shift identified.  The visualized paranasal sinuses and mastoid air cells are well aerated. The calvarium is intact.  IMPRESSION: No acute intracranial pathology.   Electronically Signed   By: Elgie Collard M.D.   On: 04/12/2015 00:40   Ct Lumbar Spine Wo Contrast  04/12/2015   CLINICAL DATA:  54 year old male status post fall. History of back surgery last week.  EXAM: CT LUMBAR SPINE WITHOUT CONTRAST  TECHNIQUE: Multidetector CT imaging of the lumbar spine was performed without intravenous contrast administration. Multiplanar CT image  reconstructions were also generated.  COMPARISON:  Radiograph dated 04/09/2015 and lumbar MRI dated 03/29/2015  FINDINGS: No acute fracture or subluxation of the lumbar spine. There are postsurgical changes of L3-L4 laminectomy and posterior fixation hardware. Small pockets of air noted  at the operative bed. No definite drainable fluid collection identified. Evaluation of this area is limited in the absence of contrast as well as due to streak artifact caused by metallic screws. There is evidence of prior L4-5 fusion.  Punctate nonobstructing right renal calculi. No hydronephrosis. Aortoiliac atherosclerotic disease.  IMPRESSION: Postoperative changes of lower lumbar spine. No acute fracture or subluxation.   Electronically Signed   By: Elgie Collard M.D.   On: 04/12/2015 00:47   Dg Hip Unilat With Pelvis 2-3 Views Left  04/12/2015   CLINICAL DATA:  Larey Seat to the ground tonight. Unwitnessed fall. Left hip pain and confusion. Soft tissue bruising.  EXAM: DG HIP (WITH OR WITHOUT PELVIS) 2-3V LEFT  COMPARISON:  None.  FINDINGS: Bilateral total hip arthroplasties using non cemented components. The left hip demonstrates a periprosthetic fracture involving the inter trochanteric region and greater trochanteric region. No significant displacement of fracture fragments. No dislocation of the hip arthroplasty.  Visualized portion of right hip arthroplasty appears intact. Heterotopic ossification over the greater trochanter. Pelvis appears intact. Postoperative changes in the lower lumbar spine.  IMPRESSION: Left total hip arthroplasty with acute appearing fracture of the inter trochanteric/greater trochanteric region of the left hip. No displacement or dislocation.   Electronically Signed   By: Burman Nieves M.D.   On: 04/12/2015 01:04   I have personally reviewed and evaluated these images and lab results as part of my medical decision-making.   EKG Interpretation None      MDM   Final diagnoses:  None     It is difficult to ascertain any history from the patient. Per EMS notes, his confusion is at baseline however I see no evidence of this and his documentation. This was concerned, this will be treated as an unwitnessed fall. I have ordered laboratory studies, urinalysis for evaluation of possible infection as cause of fall. Also coordination studies were significant bruising. CT scan of head, L-spine, and x-rays of left hip.  He currently is only complaining of pain at his surgical site at his L-spine.  X-ray of his reveals a new greater trochanteric fracture on the left side. I spoke with Dr. Eulah Pont with orthopedic surgery who recommends the patient to be admitted for observation as he will not be able to bear weight. I will speak with [facility] hospitalist for admission to the hospital.  He was given morphine 2 doses for pain control.  I personally performed the services described in this documentation, which was scribed in my presence. The recorded information has been reviewed and is accurate.     Tomasita Crumble, MD 04/12/15 [phone]

## 2015-04-11 NOTE — Care Management Important Message (Signed)
Important Message  Patient Details  Name: [name] MRN: [id number] Date of Birth: [birth year]   Medicare Important Message Given:  Yes-second notification given    Orson Aloe 04/11/2015, 12:04 PM

## 2015-04-11 NOTE — Progress Notes (Signed)
Subjective: Patient resting in bed. His brother [name] is at the bedside. Postop day 5 following lumbar decompression and arthrodesis. Patient denies any significant pain or discomfort, and reports that his legs feel much better when walking as compared to prior to surgery. Records and chart as well as his brother's description indicates significant altered mental status over the weekend. His brother notes though that he is somewhat clearer today.  Objective: Vital signs in last 24 hours: Filed Vitals:   04/10/15 1756 04/10/15 2244 04/11/15 0216 04/11/15 0634  BP: 186/84 162/87 136/99 177/84  Pulse: 78 69 72 93  Temp: 99.5 F (37.5 C) 99.3 F (37.4 C) 98.9 F (37.2 C)   TempSrc: Oral Oral Oral   Resp: Height:      Weight:      SpO2: 99% 100% 100% 100%    Physical Exam:  Awake and alert, oriented to name, [facility] hospital, and September, but not to the year. Following commands. Moving all 4 extremities well. Dressing clean, dry, and intact. Moderate bruising in adjacent soft tissues, but the incision itself looks good.  CBC  Recent Labs  04/09/15 1220 04/11/15 0630  WBC 8.8 8.2  HGB 8.2* 8.6*  HCT 25.5* 26.5*  PLT 157 193   BMET  Recent Labs  04/10/15 0504 04/11/15 0630  NA 138 140  K 4.0 3.9  CL 108 109  CO2 22 23  GLUCOSE 108* 95  BUN 11 12  CREATININE 1.12 0.97  CALCIUM 11.9* 11.5*    Studies/Results: Dg Abd Portable 1v  04/09/2015   CLINICAL DATA:  54 year old male with vomiting and without bowel movement.  EXAM: PORTABLE ABDOMEN - 1 VIEW  COMPARISON:  None.  FINDINGS: Evaluation of this exam is limited due to respiratory motion artifact. There is mild gaseous distention of the stomach. Moderate stool and air are noted throughout the colon. There is no evidence of bowel dilatation. There is degenerative changes of the spine. L4-L5 fixation screws noted. No acute fracture.  IMPRESSION: No evidence of bowel obstruction.   Electronically Signed   By:  Elgie Collard M.D.   On: 04/09/2015 19:58    Assessment/Plan: Spoke with patient's brother at length both in the room as well as outside the room. We discussed the patient's altered mental status, which apparently was worse over the weekend. Also spoke with his nurse Lebron Conners. I emphasized to the patient, his family, and the nursing staff the importance of him ambulating in the halls with the staff and/or family at least 5 times today.  Continuing PT and OT. Their notes have recommended SNF for further rehabilitation, and that may be the best option, once his mental status improves. However CIR might also be an option, if he is able to participate in therapies at least 3 hours per day, since there would be greater medical supervision (particularly in regards to the management of his altered mental status).  Overall doing well from a surgical perspective following surgery, but does need to be ambulating more. Hopefully mental status continues to improve. Appreciate assistance of hospitalist.   Hewitt Shorts, MD 04/11/2015, 8:29 AM

## 2015-04-11 NOTE — Clinical Social Work Placement (Signed)
   CLINICAL SOCIAL WORK PLACEMENT  NOTE  Date:  04/11/2015  Patient Details  Name: [name] MRN: [id number] Date of Birth: [birth year]  Clinical Social Work is seeking post-discharge placement for this patient at the Skilled  Nursing Facility level of care (*CSW will initial, date and re-position this form in  chart as items are completed):  Yes   Patient/family provided with [facility] Clinical Social Work Department's list of facilities offering this level of care within the geographic area requested by the patient (or if unable, by the patient's family).  Yes   Patient/family informed of their freedom to choose among providers that offer the needed level of care, that participate in Medicare, Medicaid or managed care program needed by the patient, have an available bed and are willing to accept the patient.      Patient/family informed of [facility]'s ownership interest in [facility] and [facility], as well as of the fact that they are under no obligation to receive care at these facilities.  PASRR submitted to EDS on 04/04/15     PASRR number received on 04/05/15     Existing PASRR number confirmed on       FL2 transmitted to all facilities in geographic area requested by pt/family on 05/05/15     FL2 transmitted to all facilities within larger geographic area on       Patient informed that his/her managed care company has contracts with or will negotiate with certain facilities, including the following:        Yes   Patient/family informed of bed offers received.  Patient chooses bed at [facility]     Physician recommends and patient chooses bed at      Patient to be transferred to [facility] on 04/11/15.  Patient to be transferred to facility by [facility]      Patient family notified on 04/11/15 of transfer.  Name of family member notified:  CSW left a voice message for the pt's wife [facility]      PHYSICIAN       Additional  Comment:    _______________________________________________ Gwynne Edinger, LCSW 04/11/2015, 2:05 PM

## 2015-04-12 ENCOUNTER — Emergency Department (HOSPITAL_COMMUNITY): Payer: Medicare Other

## 2015-04-12 ENCOUNTER — Inpatient Hospital Stay (HOSPITAL_COMMUNITY): Payer: Medicare Other

## 2015-04-12 DIAGNOSIS — I6529 Occlusion and stenosis of unspecified carotid artery: Secondary | ICD-10-CM | POA: Diagnosis present

## 2015-04-12 DIAGNOSIS — W06XXXA Fall from bed, initial encounter: Secondary | ICD-10-CM | POA: Diagnosis present

## 2015-04-12 DIAGNOSIS — R296 Repeated falls: Secondary | ICD-10-CM | POA: Diagnosis present

## 2015-04-12 DIAGNOSIS — R410 Disorientation, unspecified: Secondary | ICD-10-CM | POA: Diagnosis not present

## 2015-04-12 DIAGNOSIS — Z79899 Other long term (current) drug therapy: Secondary | ICD-10-CM | POA: Diagnosis not present

## 2015-04-12 DIAGNOSIS — E860 Dehydration: Secondary | ICD-10-CM | POA: Diagnosis present

## 2015-04-12 DIAGNOSIS — B850 Pediculosis due to Pediculus humanus capitis: Secondary | ICD-10-CM | POA: Diagnosis present

## 2015-04-12 DIAGNOSIS — S42252A Displaced fracture of greater tuberosity of left humerus, initial encounter for closed fracture: Secondary | ICD-10-CM | POA: Diagnosis not present

## 2015-04-12 DIAGNOSIS — E440 Moderate protein-calorie malnutrition: Secondary | ICD-10-CM | POA: Insufficient documentation

## 2015-04-12 DIAGNOSIS — M7989 Other specified soft tissue disorders: Secondary | ICD-10-CM | POA: Diagnosis not present

## 2015-04-12 DIAGNOSIS — R52 Pain, unspecified: Secondary | ICD-10-CM | POA: Diagnosis not present

## 2015-04-12 DIAGNOSIS — A419 Sepsis, unspecified organism: Secondary | ICD-10-CM | POA: Diagnosis not present

## 2015-04-12 DIAGNOSIS — G8929 Other chronic pain: Secondary | ICD-10-CM | POA: Diagnosis present

## 2015-04-12 DIAGNOSIS — T84041A Periprosthetic fracture around internal prosthetic left hip joint, initial encounter: Secondary | ICD-10-CM | POA: Diagnosis present

## 2015-04-12 DIAGNOSIS — Z9114 Patient's other noncompliance with medication regimen: Secondary | ICD-10-CM | POA: Diagnosis present

## 2015-04-12 DIAGNOSIS — S72115A Nondisplaced fracture of greater trochanter of left femur, initial encounter for closed fracture: Secondary | ICD-10-CM | POA: Diagnosis present

## 2015-04-12 DIAGNOSIS — K7030 Alcoholic cirrhosis of liver without ascites: Secondary | ICD-10-CM | POA: Diagnosis present

## 2015-04-12 DIAGNOSIS — K219 Gastro-esophageal reflux disease without esophagitis: Secondary | ICD-10-CM | POA: Diagnosis present

## 2015-04-12 DIAGNOSIS — E210 Primary hyperparathyroidism: Secondary | ICD-10-CM | POA: Diagnosis present

## 2015-04-12 DIAGNOSIS — I808 Phlebitis and thrombophlebitis of other sites: Secondary | ICD-10-CM | POA: Diagnosis present

## 2015-04-12 DIAGNOSIS — Q676 Pectus excavatum: Secondary | ICD-10-CM | POA: Diagnosis not present

## 2015-04-12 DIAGNOSIS — Z6822 Body mass index (BMI) 22.0-22.9, adult: Secondary | ICD-10-CM | POA: Diagnosis not present

## 2015-04-12 DIAGNOSIS — M4804 Spinal stenosis, thoracic region: Secondary | ICD-10-CM | POA: Diagnosis present

## 2015-04-12 DIAGNOSIS — Z981 Arthrodesis status: Secondary | ICD-10-CM | POA: Diagnosis not present

## 2015-04-12 DIAGNOSIS — G629 Polyneuropathy, unspecified: Secondary | ICD-10-CM | POA: Diagnosis present

## 2015-04-12 DIAGNOSIS — Z96643 Presence of artificial hip joint, bilateral: Secondary | ICD-10-CM | POA: Diagnosis present

## 2015-04-12 DIAGNOSIS — Z79891 Long term (current) use of opiate analgesic: Secondary | ICD-10-CM | POA: Diagnosis not present

## 2015-04-12 DIAGNOSIS — D539 Nutritional anemia, unspecified: Secondary | ICD-10-CM | POA: Diagnosis present

## 2015-04-12 DIAGNOSIS — Z515 Encounter for palliative care: Secondary | ICD-10-CM | POA: Diagnosis not present

## 2015-04-12 DIAGNOSIS — F1020 Alcohol dependence, uncomplicated: Secondary | ICD-10-CM

## 2015-04-12 DIAGNOSIS — Z96611 Presence of right artificial shoulder joint: Secondary | ICD-10-CM | POA: Diagnosis present

## 2015-04-12 DIAGNOSIS — M1990 Unspecified osteoarthritis, unspecified site: Secondary | ICD-10-CM | POA: Diagnosis present

## 2015-04-12 DIAGNOSIS — W1830XA Fall on same level, unspecified, initial encounter: Secondary | ICD-10-CM | POA: Diagnosis not present

## 2015-04-12 DIAGNOSIS — J690 Pneumonitis due to inhalation of food and vomit: Secondary | ICD-10-CM | POA: Diagnosis not present

## 2015-04-12 DIAGNOSIS — F3110 Bipolar disorder, current episode manic without psychotic features, unspecified: Secondary | ICD-10-CM | POA: Diagnosis not present

## 2015-04-12 DIAGNOSIS — Z881 Allergy status to other antibiotic agents status: Secondary | ICD-10-CM | POA: Diagnosis not present

## 2015-04-12 DIAGNOSIS — F0390 Unspecified dementia without behavioral disturbance: Secondary | ICD-10-CM | POA: Diagnosis present

## 2015-04-12 DIAGNOSIS — Z66 Do not resuscitate: Secondary | ICD-10-CM | POA: Diagnosis present

## 2015-04-12 DIAGNOSIS — F3132 Bipolar disorder, current episode depressed, moderate: Secondary | ICD-10-CM | POA: Diagnosis present

## 2015-04-12 DIAGNOSIS — Y792 Prosthetic and other implants, materials and accessory orthopedic devices associated with adverse incidents: Secondary | ICD-10-CM | POA: Diagnosis present

## 2015-04-12 DIAGNOSIS — Z885 Allergy status to narcotic agent status: Secondary | ICD-10-CM | POA: Diagnosis not present

## 2015-04-12 DIAGNOSIS — E0781 Sick-euthyroid syndrome: Secondary | ICD-10-CM | POA: Diagnosis present

## 2015-04-12 DIAGNOSIS — S7292XA Unspecified fracture of left femur, initial encounter for closed fracture: Secondary | ICD-10-CM | POA: Diagnosis not present

## 2015-04-12 DIAGNOSIS — Z7982 Long term (current) use of aspirin: Secondary | ICD-10-CM | POA: Diagnosis not present

## 2015-04-12 DIAGNOSIS — K7290 Hepatic failure, unspecified without coma: Secondary | ICD-10-CM | POA: Diagnosis present

## 2015-04-12 DIAGNOSIS — I10 Essential (primary) hypertension: Secondary | ICD-10-CM | POA: Diagnosis not present

## 2015-04-12 DIAGNOSIS — F319 Bipolar disorder, unspecified: Secondary | ICD-10-CM | POA: Diagnosis not present

## 2015-04-12 DIAGNOSIS — Y92122 Bedroom in nursing home as the place of occurrence of the external cause: Secondary | ICD-10-CM | POA: Diagnosis not present

## 2015-04-12 DIAGNOSIS — L409 Psoriasis, unspecified: Secondary | ICD-10-CM | POA: Diagnosis present

## 2015-04-12 DIAGNOSIS — Z96612 Presence of left artificial shoulder joint: Secondary | ICD-10-CM | POA: Diagnosis present

## 2015-04-12 DIAGNOSIS — F17210 Nicotine dependence, cigarettes, uncomplicated: Secondary | ICD-10-CM | POA: Diagnosis present

## 2015-04-12 LAB — COMPREHENSIVE METABOLIC PANEL
ALT: 68 U/L — AB (ref 17–63)
AST: 70 U/L — AB (ref 15–41)
Albumin: 3.1 g/dL — ABNORMAL LOW (ref 3.5–5.0)
Alkaline Phosphatase: 96 U/L (ref 38–126)
Anion gap: 6 (ref 5–15)
BILIRUBIN TOTAL: 1.5 mg/dL — AB (ref 0.3–1.2)
BUN: 18 mg/dL (ref 6–20)
CHLORIDE: 107 mmol/L (ref 101–111)
CO2: 25 mmol/L (ref 22–32)
CREATININE: 0.81 mg/dL (ref 0.61–1.24)
Calcium: 11.7 mg/dL — ABNORMAL HIGH (ref 8.9–10.3)
Glucose, Bld: 108 mg/dL — ABNORMAL HIGH (ref 65–99)
Potassium: 3.5 mmol/L (ref 3.5–5.1)
Sodium: 138 mmol/L (ref 135–145)
TOTAL PROTEIN: 7.1 g/dL (ref 6.5–8.1)

## 2015-04-12 LAB — CBC
HCT: 28.9 % — ABNORMAL LOW (ref 39.0–52.0)
Hemoglobin: 9.3 g/dL — ABNORMAL LOW (ref 13.0–17.0)
MCH: 33 pg (ref 26.0–34.0)
MCHC: 32.2 g/dL (ref 30.0–36.0)
MCV: 102.5 fL — AB (ref 78.0–100.0)
PLATELETS: 271 10*3/uL (ref 150–400)
RBC: 2.82 MIL/uL — AB (ref 4.22–5.81)
RDW: 12.6 % (ref 11.5–15.5)
WBC: 10.8 10*3/uL — AB (ref 4.0–10.5)

## 2015-04-12 LAB — CBC WITH DIFFERENTIAL/PLATELET
BASOS PCT: 0 % (ref 0–1)
Basophils Absolute: 0 10*3/uL (ref 0.0–0.1)
Eosinophils Absolute: 0.1 10*3/uL (ref 0.0–0.7)
Eosinophils Relative: 1 % (ref 0–5)
HEMATOCRIT: 28.5 % — AB (ref 39.0–52.0)
HEMOGLOBIN: 9.4 g/dL — AB (ref 13.0–17.0)
LYMPHS ABS: 1.1 10*3/uL (ref 0.7–4.0)
Lymphocytes Relative: 14 % (ref 12–46)
MCH: 33.9 pg (ref 26.0–34.0)
MCHC: 33 g/dL (ref 30.0–36.0)
MCV: 102.9 fL — AB (ref 78.0–100.0)
MONO ABS: 0.5 10*3/uL (ref 0.1–1.0)
MONOS PCT: 7 % (ref 3–12)
NEUTROS ABS: 6 10*3/uL (ref 1.7–7.7)
NEUTROS PCT: 78 % — AB (ref 43–77)
Platelets: 200 10*3/uL (ref 150–400)
RBC: 2.77 MIL/uL — ABNORMAL LOW (ref 4.22–5.81)
RDW: 12.4 % (ref 11.5–15.5)
WBC: 7.7 10*3/uL (ref 4.0–10.5)

## 2015-04-12 LAB — APTT: APTT: 25 s (ref 24–37)

## 2015-04-12 LAB — FERRITIN: Ferritin: 315 ng/mL (ref 24–336)

## 2015-04-12 LAB — CULTURE, BLOOD (ROUTINE X 2)
CULTURE: NO GROWTH
CULTURE: NO GROWTH

## 2015-04-12 LAB — URINALYSIS, ROUTINE W REFLEX MICROSCOPIC
GLUCOSE, UA: NEGATIVE mg/dL
Hgb urine dipstick: NEGATIVE
KETONES UR: NEGATIVE mg/dL
LEUKOCYTES UA: NEGATIVE
NITRITE: NEGATIVE
PH: 5.5 (ref 5.0–8.0)
Protein, ur: NEGATIVE mg/dL
SPECIFIC GRAVITY, URINE: 1.023 (ref 1.005–1.030)
Urobilinogen, UA: 1 mg/dL (ref 0.0–1.0)

## 2015-04-12 LAB — BASIC METABOLIC PANEL
ANION GAP: 7 (ref 5–15)
BUN: 15 mg/dL (ref 6–20)
CHLORIDE: 105 mmol/L (ref 101–111)
CO2: 23 mmol/L (ref 22–32)
Calcium: 11.4 mg/dL — ABNORMAL HIGH (ref 8.9–10.3)
Creatinine, Ser: 0.94 mg/dL (ref 0.61–1.24)
GFR calc Af Amer: >60 mL/min (ref >60)
GFR calc non Af Amer: >60 mL/min (ref >60)
GLUCOSE: 114 mg/dL — AB (ref 65–99)
POTASSIUM: 3.4 mmol/L — AB (ref 3.5–5.1)
Sodium: 135 mmol/L (ref 135–145)

## 2015-04-12 LAB — LACTIC ACID, PLASMA: LACTIC ACID, VENOUS: 1.4 mmol/L (ref 0.5–2.0)

## 2015-04-12 LAB — AMMONIA: AMMONIA: 64 umol/L — AB (ref 9–35)

## 2015-04-12 LAB — IRON AND TIBC
IRON: 37 ug/dL — AB (ref 45–182)
Saturation Ratios: 11 % — ABNORMAL LOW (ref 17.9–39.5)
TIBC: 344 ug/dL (ref 250–450)
UIBC: 307 ug/dL

## 2015-04-12 LAB — PROTIME-INR
INR: 1.16 (ref 0.00–1.49)
Prothrombin Time: 15 seconds (ref 11.6–15.2)

## 2015-04-12 LAB — LITHIUM LEVEL: LITHIUM LVL: 0.27 mmol/L — AB (ref 0.60–1.20)

## 2015-04-12 LAB — PHOSPHORUS: PHOSPHORUS: 2.8 mg/dL (ref 2.5–4.6)

## 2015-04-12 MED ORDER — LACTULOSE 10 GM/15ML PO SOLN
30.0000 g | Freq: Two times a day (BID) | ORAL | Status: DC
  Administered 2015-04-14 – 2015-04-15 (×4): 30 g via ORAL
  Filled 2015-04-12 (×5): qty 45

## 2015-04-12 MED ORDER — OXYCODONE HCL 5 MG PO TABS
5.0000 mg | ORAL_TABLET | ORAL | Status: DC | PRN
  Administered 2015-04-12 – 2015-04-13 (×3): 10 mg via ORAL
  Filled 2015-04-12 (×3): qty 2

## 2015-04-12 MED ORDER — LORAZEPAM 2 MG/ML IJ SOLN
0.0000 mg | Freq: Four times a day (QID) | INTRAMUSCULAR | Status: DC
  Administered 2015-04-12: 2 mg via INTRAVENOUS
  Administered 2015-04-12: 1 mg via INTRAVENOUS
  Administered 2015-04-13 (×2): 2 mg via INTRAVENOUS
  Filled 2015-04-12 (×3): qty 1
  Filled 2015-04-12: qty 2

## 2015-04-12 MED ORDER — VITAMIN B-1 100 MG PO TABS
100.0000 mg | ORAL_TABLET | Freq: Every day | ORAL | Status: DC
  Administered 2015-04-12: 100 mg via ORAL
  Filled 2015-04-12: qty 1

## 2015-04-12 MED ORDER — LITHIUM CARBONATE 300 MG PO CAPS
900.0000 mg | ORAL_CAPSULE | Freq: Every day | ORAL | Status: DC
  Filled 2015-04-12: qty 3

## 2015-04-12 MED ORDER — PANTOPRAZOLE SODIUM 40 MG PO TBEC
40.0000 mg | DELAYED_RELEASE_TABLET | Freq: Two times a day (BID) | ORAL | Status: DC
  Administered 2015-04-12: 40 mg via ORAL
  Filled 2015-04-12 (×3): qty 1

## 2015-04-12 MED ORDER — TRIAMCINOLONE ACETONIDE 0.1 % EX OINT
1.0000 "application " | TOPICAL_OINTMENT | Freq: Two times a day (BID) | CUTANEOUS | Status: DC
  Administered 2015-04-13 – 2015-04-15 (×5): 1 via TOPICAL
  Filled 2015-04-12 (×3): qty 15

## 2015-04-12 MED ORDER — HYDROCODONE-ACETAMINOPHEN 5-325 MG PO TABS
1.0000 | ORAL_TABLET | Freq: Four times a day (QID) | ORAL | Status: DC | PRN
  Administered 2015-04-12 (×2): 2 via ORAL
  Filled 2015-04-12 (×2): qty 2

## 2015-04-12 MED ORDER — ASPIRIN EC 81 MG PO TBEC
81.0000 mg | DELAYED_RELEASE_TABLET | Freq: Every day | ORAL | Status: DC
  Filled 2015-04-12: qty 1

## 2015-04-12 MED ORDER — AMLODIPINE BESYLATE 10 MG PO TABS
10.0000 mg | ORAL_TABLET | Freq: Every day | ORAL | Status: DC
  Administered 2015-04-12: 10 mg via ORAL
  Filled 2015-04-12: qty 1

## 2015-04-12 MED ORDER — LORAZEPAM 2 MG/ML IJ SOLN
1.0000 mg | Freq: Four times a day (QID) | INTRAMUSCULAR | Status: DC | PRN
  Administered 2015-04-12 – 2015-04-13 (×2): 1 mg via INTRAVENOUS
  Filled 2015-04-12: qty 1

## 2015-04-12 MED ORDER — LORAZEPAM 1 MG PO TABS: 1.0000 mg | ORAL_TABLET | Freq: Four times a day (QID) | ORAL | Status: DC | PRN

## 2015-04-12 MED ORDER — AMOXICILLIN-POT CLAVULANATE 875-125 MG PO TABS
1.0000 | ORAL_TABLET | Freq: Two times a day (BID) | ORAL | Status: DC
  Administered 2015-04-12: 1 via ORAL
  Filled 2015-04-12 (×2): qty 1

## 2015-04-12 MED ORDER — LORAZEPAM 2 MG/ML IJ SOLN: 0.0000 mg | Freq: Two times a day (BID) | INTRAMUSCULAR | Status: DC

## 2015-04-12 MED ORDER — CLOBETASOL PROPIONATE 0.05 % EX OINT
1.0000 "application " | TOPICAL_OINTMENT | Freq: Every day | CUTANEOUS | Status: DC | PRN
  Administered 2015-04-17: 1 via TOPICAL
  Filled 2015-04-12: qty 15

## 2015-04-12 MED ORDER — MORPHINE SULFATE (PF) 4 MG/ML IV SOLN
6.0000 mg | Freq: Once | INTRAVENOUS | Status: AC
  Administered 2015-04-12: 6 mg via INTRAVENOUS
  Filled 2015-04-12: qty 2

## 2015-04-12 MED ORDER — THIAMINE HCL 100 MG/ML IJ SOLN
100.0000 mg | Freq: Every day | INTRAMUSCULAR | Status: DC
  Filled 2015-04-12: qty 2

## 2015-04-12 MED ORDER — HYDRALAZINE HCL 20 MG/ML IJ SOLN: 10.0000 mg | INTRAMUSCULAR | Status: DC | PRN

## 2015-04-12 MED ORDER — TRAZODONE HCL 100 MG PO TABS
200.0000 mg | ORAL_TABLET | Freq: Every day | ORAL | Status: DC
  Filled 2015-04-12: qty 2

## 2015-04-12 MED ORDER — FOLIC ACID 1 MG PO TABS
1.0000 mg | ORAL_TABLET | Freq: Every day | ORAL | Status: DC
  Administered 2015-04-12: 1 mg via ORAL
  Filled 2015-04-12: qty 1

## 2015-04-12 MED ORDER — ENSURE ENLIVE PO LIQD
237.0000 mL | Freq: Two times a day (BID) | ORAL | Status: DC
  Administered 2015-04-15 – 2015-04-18 (×8): 237 mL via ORAL

## 2015-04-12 MED ORDER — ENOXAPARIN SODIUM 40 MG/0.4ML ~~LOC~~ SOLN
40.0000 mg | SUBCUTANEOUS | Status: DC
  Administered 2015-04-12 – 2015-04-14 (×3): 40 mg via SUBCUTANEOUS
  Filled 2015-04-12 (×4): qty 0.4

## 2015-04-12 MED ORDER — TRIAMCINOLONE ACETONIDE 0.5 % EX OINT
1.0000 "application " | TOPICAL_OINTMENT | Freq: Two times a day (BID) | CUTANEOUS | Status: DC
  Administered 2015-04-13 – 2015-04-15 (×4): 1 via TOPICAL
  Filled 2015-04-12 (×2): qty 15

## 2015-04-12 MED ORDER — MORPHINE SULFATE (PF) 2 MG/ML IV SOLN
0.5000 mg | INTRAVENOUS | Status: DC | PRN
  Filled 2015-04-12: qty 1

## 2015-04-12 MED ORDER — ADULT MULTIVITAMIN W/MINERALS CH
1.0000 | ORAL_TABLET | Freq: Every day | ORAL | Status: DC
  Administered 2015-04-12: 1 via ORAL
  Filled 2015-04-12: qty 1

## 2015-04-12 MED ORDER — CLONIDINE HCL 0.1 MG PO TABS: 0.1000 mg | ORAL_TABLET | Freq: Two times a day (BID) | ORAL | Status: DC

## 2015-04-12 MED ORDER — KCL IN DEXTROSE-NACL 10-5-0.45 MEQ/L-%-% IV SOLN
INTRAVENOUS | Status: DC
  Administered 2015-04-12 – 2015-04-13 (×2): via INTRAVENOUS
  Filled 2015-04-12 (×11): qty 1000

## 2015-04-12 NOTE — Consult Note (Signed)
ORTHOPAEDIC CONSULTATION  REQUESTING PHYSICIAN: Etta Quill, DO  Chief Complaint: L hip pain  HPI: [name] is a 54 y.o. male who complains of L hip pain after a fall at the SNF yesterday.  He was very confused at presentation to the ED, so information about the fall is limited. Per the patients report, he fell getting out of bed. [facility] ([facility]/30-9/6) after having a lumbar fusion.  During that admission he had AMS.  He was discharged on augmentin.  Per the family member at the bedside, the patient has been falling a lot more and having trouble with balance.  Patient has a hx of chronic alcohol abuse with 4 beers a day as well as vodka.  CIWA protocol was started at his last admission.  Patient denies numbness or tingling that radiate into the foot.  He has increased pain with ambulation, but is relatively comfortably from a hip standpoint while lying in bed. Hx of bilateral total hip arthroplasty for AVN years ago by Dr. Noemi Chapel.  Past Medical History  Diagnosis Date  . Tachycardia   . Peripheral neuropathy   . Alcohol abuse     hx of quit 4/04  . Bilateral external ear infections 2007  . Tobacco abuse   . Bipolar affective disorder     Followed by Dr. Wallace Going  . Avascular necrosis     SP right and left hip replacements and L shoulder arthroplasty, secondary to steroid use.  Has been on embrel in past.  . Psoriasis     Previously on embrel, stopped because of price.   . Hypertension   . Hepatitis C   . ARF (acute renal failure) 2008    Multifactorial in setting of over use of ibuprofen and HCTZ./ACEI  . Cirrhosis   . Back pain   . Pneumonia     hx  . Heart murmur   . Stroke 11    ? "mini stroke"  . GERD (gastroesophageal reflux disease)     occ  . Arthritis   . Pharyngeal edema 03/12/2013   Past Surgical History  Procedure Laterality Date  . Right total hip replacement  2004    Due to AVN  . Left total hip replacement   2001    Due to AVN  . Closed manipulation, pinning, application of a traction pin right  2002  . Right shoulder arthroplasty      Due to AVN  . Appendectomy  1989  . Joint replacement Left 02    shoulder  . Tonsillectomy    . Hernia repair    . Anterior fusion cervical spine  03/10/2013  . Anterior cervical decomp/discectomy fusion N/A 03/10/2013    Procedure: Cervical Two-Three, Three to Four, Cervical Four to Five, Cervical Five to Six anterior cervical decompression with fusion plating and bonegraft;  Surgeon: Hosie Spangle, MD;  Location: [facility];  Service: Neurosurgery;  Laterality: N/A;  ANTERIOR CERVICAL DECOMPRESSION/DISCECTOMY FUSION 4 LEVELS  . Esophagogastroduodenoscopy N/A 04/11/2014    Procedure: ESOPHAGOGASTRODUODENOSCOPY (EGD);  Surgeon: Wonda Horner, MD;  Location: [facility];  Service: Endoscopy;  Laterality: N/A;  . Esophagogastroduodenoscopy N/A 04/11/2014    Procedure: ESOPHAGOGASTRODUODENOSCOPY (EGD);  Surgeon: Wonda Horner, MD;  Location: [facility];  Service: Endoscopy;  Laterality: N/A;   Social History   Social History  . Marital Status: Married    Spouse Name: N/A  . Number of Children: N/A  . Years  of Education: N/A   Social History Main Topics  . Smoking status: Current Every Day Smoker -- 1.00 packs/day for 35 years    Types: Cigarettes  . Smokeless tobacco: Never Used     Comment: Not interested in stopping.      i want to quit togeather with my wife "  . Alcohol Use: No     Comment: Previously alcoholic, denies ETOH for "past few weeks"  . Drug Use: No  . Sexual Activity: No   Other Topics Concern  . None   Social History Narrative   Patient has problems with his girlfriend who he refers to as an alcoholic.    Family History  Problem Relation Age of Onset  . Hypertension Mother   . Bipolar disorder Father   . Obesity Father   . Obesity Brother    Allergies  Allergen Reactions  . Prochlorperazine Maleate Other (See Comments)     REACTION: jaw lock  . Erythromycin Other (See Comments)    REACTION: GI upset  . Hydromorphone Hcl Itching    REACTION: itching   Prior to Admission medications   Medication Sig Start Date End Date Taking? Authorizing Provider  acetaminophen (TYLENOL) 325 MG tablet Take 2 tablets (650 mg total) by mouth every 4 (four) hours as needed. Patient taking differently: Take 650 mg by mouth every 4 (four) hours as needed.  03/16/13  Yes Marianne L York, PA-C  amLODipine (NORVASC) 5 MG tablet Take 2 tablets (10 mg total) by mouth daily. 03/16/13  Yes Bobby Rumpf York, PA-C  aspirin 81 MG tablet Take 81 mg by mouth daily.   Yes Historical Provider, MD  augmented betamethasone dipropionate (DIPROLENE-AF) 0.05 % ointment Apply 1 application topically 2 (two) times daily. 03/06/15  Yes Historical Provider, MD  clobetasol ointment (TEMOVATE) 7.67 % Apply 1 application topically daily as needed. Apply affected areas for psoriasis   Yes Historical Provider, MD  fluticasone (CUTIVATE) 0.005 % ointment Apply 1 application topically 2 (two) times daily. 03/06/15  Yes Historical Provider, MD  lithium carbonate 300 MG capsule Take 900 mg by mouth at bedtime.    Yes Historical Provider, MD  omeprazole (PRILOSEC) 20 MG capsule Take 1 capsule (20 mg total) by mouth 2 (two) times daily. 04/11/14  Yes Tanna Furry, MD  oxyCODONE (OXY IR/ROXICODONE) 5 MG immediate release tablet Take 1-2 tablets (5-10 mg total) by mouth every 4 (four) hours as needed for moderate pain or breakthrough pain. 04/11/15  Yes Verlee Monte, MD  oxyCODONE-acetaminophen (PERCOCET/ROXICET) 5-325 MG per tablet Take 1 tablet by mouth every 6 (six) hours as needed for moderate pain.  03/18/15  Yes Historical Provider, MD  traZODone (DESYREL) 100 MG tablet Take 200 mg by mouth at bedtime.   Yes Historical Provider, MD  amoxicillin-clavulanate (AUGMENTIN) 875-125 MG per tablet Take 1 tablet by mouth 2 (two) times daily. Patient not taking: Reported on 04/12/2015  04/11/15   Verlee Monte, MD   Ct Head Wo Contrast  04/12/2015   CLINICAL DATA:  54 year old male with fall  EXAM: CT HEAD WITHOUT CONTRAST  TECHNIQUE: Contiguous axial images were obtained from the base of the skull through the vertex without intravenous contrast.  COMPARISON:  Head CT dated 03/12/2013  FINDINGS: There is slight prominence of the sulci compatible with age-related volume loss. Minimal periventricular and deep white matter hypodensities represent chronic microvascular ischemic changes. There is no intracranial hemorrhage. No mass effect or midline shift identified.  The visualized paranasal sinuses and mastoid  air cells are well aerated. The calvarium is intact.  IMPRESSION: No acute intracranial pathology.   Electronically Signed   By: Anner Crete M.D.   On: 04/12/2015 00:40   Ct Lumbar Spine Wo Contrast  04/12/2015   CLINICAL DATA:  54 year old male status post fall. History of back surgery last week.  EXAM: CT LUMBAR SPINE WITHOUT CONTRAST  TECHNIQUE: Multidetector CT imaging of the lumbar spine was performed without intravenous contrast administration. Multiplanar CT image reconstructions were also generated.  COMPARISON:  Radiograph dated 04/09/2015 and lumbar MRI dated 03/29/2015  FINDINGS: No acute fracture or subluxation of the lumbar spine. There are postsurgical changes of L3-L4 laminectomy and posterior fixation hardware. Small pockets of air noted at the operative bed. No definite drainable fluid collection identified. Evaluation of this area is limited in the absence of contrast as well as due to streak artifact caused by metallic screws. There is evidence of prior L4-5 fusion.  Punctate nonobstructing right renal calculi. No hydronephrosis. Aortoiliac atherosclerotic disease.  IMPRESSION: Postoperative changes of lower lumbar spine. No acute fracture or subluxation.   Electronically Signed   By: Anner Crete M.D.   On: 04/12/2015 00:47   Dg Hip Unilat With Pelvis 2-3 Views  Left  04/12/2015   CLINICAL DATA:  [place] to the ground tonight. Unwitnessed fall. Left hip pain and confusion. Soft tissue bruising.  EXAM: DG HIP (WITH OR WITHOUT PELVIS) 2-3V LEFT  COMPARISON:  None.  FINDINGS: Bilateral total hip arthroplasties using non cemented components. The left hip demonstrates a periprosthetic fracture involving the inter trochanteric region and greater trochanteric region. No significant displacement of fracture fragments. No dislocation of the hip arthroplasty.  Visualized portion of right hip arthroplasty appears intact. Heterotopic ossification over the greater trochanter. Pelvis appears intact. Postoperative changes in the lower lumbar spine.  IMPRESSION: Left total hip arthroplasty with acute appearing fracture of the inter trochanteric/greater trochanteric region of the left hip. No displacement or dislocation.   Electronically Signed   By: Lucienne Capers M.D.   On: 04/12/2015 01:04    Positive ROS: All other systems have been reviewed and were otherwise negative with the exception of those mentioned in the HPI and as above.  Labs cbc  Recent Labs  04/11/15 0630 04/11/15 2347  WBC 8.2 7.7  HGB 8.6* 9.4*  HCT 26.5* 28.5*  PLT 193 200    Labs inflam No results for input(s): CRP in the last 72 hours.  Invalid input(s): ESR  Labs coag  Recent Labs  04/11/15 2347  INR 1.16     Recent Labs  04/11/15 0630 04/11/15 2347  NA 140 135  K 3.9 3.4*  CL 109 105  CO2 23 23  GLUCOSE 95 114*  BUN 12 15  CREATININE 0.97 0.94  CALCIUM 11.5* 11.4*    Physical Exam: Filed Vitals:   04/12/15 0337  BP: 173/90  Pulse: 84  Temp: 97.5 F (36.4 C)  Resp: 18   General: Confused, had to restate multiple times that he was at [place].  Doesn't recall much of yesterday and the fall. Cardiovascular: No pedal edema Respiratory: No cyanosis, no use of accessory musculature GI: No organomegaly, abdomen is soft and non-tender Skin: No lesions in the area  of chief complaint other than those listed below in MSK exam.  Neurologic: Sensation intact distally Psychiatric: Patient is confused and agitated  Lymphatic: No axillary or cervical lymphadenopathy  MUSCULOSKELETAL:  L hip has no swelling or erythema.  Mild tenderness to  palpation over the fracture site. No pain with ROM.  Sensation intact with 2+ distal pulses.   Assessment: L hip pain after a unwitnessed fall at the SNF yesterday with a hx of bilateral total hip arthroplasty, new greater troch fracture seen on xray  Plan: Will need to evaluate the stability of the total hip with a CT scan today. Will have the patient remain on bedrest until the results can be reviewed.  Patient is very confused and agitated in the room.  Unable to orient.    Gae Dry, PA-C Cell [phone]   04/12/2015 7:53 AM

## 2015-04-12 NOTE — Clinical Social Work Note (Signed)
CSW informed by RN, patient admitted from [facility]. CSW spoke with patient's wife who states patient will discharge home at time of discharge. RNCM updated.  Full assessment to follow.  Marcelline Deist, [place] - [phone] Clinical Social Work Department Orthopedics [phone]) and Surgical [phone])

## 2015-04-12 NOTE — H&P (Signed)
[facility] History and Physical  [name]:[id number] DOB: 07-Feb-1961 DOA: 04/11/2015  Referring physician: EDP PCP: [name]., MD   Chief Complaint: Fall, hip pain   HPI: [name] is a 54 y.o. male who was just on our service from 8/30 to yesterday 9/6 for back pain ultimately treated with lumbar fusion.  That admission was complicated by AMS, and fever.  He was ultimately discharged on 3 more days of augmentin for his fever yesterday.  After his discharge he went to the SNF where he was going to begin rehab, and just a few hours later had a fall.  He was brought in to the ED and found to have broken his left hip.  Review of Systems: Systems reviewed.  As above, otherwise negative  Past Medical History  Diagnosis Date  . Tachycardia   . Peripheral neuropathy   . Alcohol abuse     hx of quit 4/04  . Bilateral external ear infections 2007  . Tobacco abuse   . Bipolar affective disorder     Followed by Dr. Hortencia Melendez  . Avascular necrosis     SP right and left hip replacements and L shoulder arthroplasty, secondary to steroid use.  Has been on embrel in past.  . Psoriasis     Previously on embrel, stopped because of price.   . Hypertension   . Hepatitis C   . ARF (acute renal failure) 2008    Multifactorial in setting of over use of ibuprofen and HCTZ./ACEI  . Cirrhosis   . Back pain   . Pneumonia     hx  . Heart murmur   . Stroke 11    ? "mini stroke"  . GERD (gastroesophageal reflux disease)     occ  . Arthritis   . Pharyngeal edema 03/12/2013   Past Surgical History  Procedure Laterality Date  . Right total hip replacement  2004    Due to AVN  . Left total hip replacement  2001    Due to AVN  . Closed manipulation, pinning, application of a traction pin right  2002  . Right shoulder arthroplasty      Due to AVN  . Appendectomy  1989  . Joint replacement Left 02    shoulder  . Tonsillectomy    . Hernia repair    . Anterior  fusion cervical spine  03/10/2013  . Anterior cervical decomp/discectomy fusion N/A 03/10/2013    Procedure: Cervical Two-Three, Three to Four, Cervical Four to Five, Cervical Five to Six anterior cervical decompression with fusion plating and bonegraft;  Surgeon: Damon Shorts, MD;  Location: [facility];  Service: Neurosurgery;  Laterality: N/A;  ANTERIOR CERVICAL DECOMPRESSION/DISCECTOMY FUSION 4 LEVELS  . Esophagogastroduodenoscopy N/A 04/11/2014    Procedure: ESOPHAGOGASTRODUODENOSCOPY (EGD);  Surgeon: Damon Shiver, MD;  Location: [facility];  Service: Endoscopy;  Laterality: N/A;  . Esophagogastroduodenoscopy N/A 04/11/2014    Procedure: ESOPHAGOGASTRODUODENOSCOPY (EGD);  Surgeon: Damon Shiver, MD;  Location: [facility];  Service: Endoscopy;  Laterality: N/A;   Social History:  reports that he has been smoking Cigarettes.  He has a 35 pack-year smoking history. He has never used smokeless tobacco. He reports that he does not drink alcohol or use illicit drugs.  Allergies  Allergen Reactions  . Prochlorperazine Maleate Other (See Comments)    REACTION: jaw lock  . Erythromycin Other (See Comments)    REACTION: GI upset  . Hydromorphone Hcl Itching    REACTION:  itching    Family History  Problem Relation Age of Onset  . Hypertension Mother   . Bipolar disorder Father   . Obesity Father   . Obesity Brother      Prior to Admission medications   Medication Sig Start Date End Date Taking? Authorizing Provider  acetaminophen (TYLENOL) 325 MG tablet Take 2 tablets (650 mg total) by mouth every 4 (four) hours as needed. Patient taking differently: Take 650 mg by mouth every 4 (four) hours as needed.  03/16/13  Yes Marianne L York, PA-C  amLODipine (NORVASC) 5 MG tablet Take 2 tablets (10 mg total) by mouth daily. 03/16/13  Yes Tora Kindred York, PA-C  aspirin 81 MG tablet Take 81 mg by mouth daily.   Yes Historical Provider, MD  augmented betamethasone dipropionate (DIPROLENE-AF) 0.05  % ointment Apply 1 application topically 2 (two) times daily. 03/06/15  Yes Historical Provider, MD  clobetasol ointment (TEMOVATE) 0.05 % Apply 1 application topically daily as needed. Apply affected areas for psoriasis   Yes Historical Provider, MD  fluticasone (CUTIVATE) 0.005 % ointment Apply 1 application topically 2 (two) times daily. 03/06/15  Yes Historical Provider, MD  lithium carbonate 300 MG capsule Take 900 mg by mouth at bedtime.    Yes Historical Provider, MD  omeprazole (PRILOSEC) 20 MG capsule Take 1 capsule (20 mg total) by mouth 2 (two) times daily. 04/11/14  Yes Damon Porter, MD  oxyCODONE (OXY IR/ROXICODONE) 5 MG immediate release tablet Take 1-2 tablets (5-10 mg total) by mouth every 4 (four) hours as needed for moderate pain or breakthrough pain. 04/11/15  Yes Clydia Llano, MD  oxyCODONE-acetaminophen (PERCOCET/ROXICET) 5-325 MG per tablet Take 1 tablet by mouth every 6 (six) hours as needed for moderate pain.  03/18/15  Yes Historical Provider, MD  traZODone (DESYREL) 100 MG tablet Take 200 mg by mouth at bedtime.   Yes Historical Provider, MD  amoxicillin-clavulanate (AUGMENTIN) 875-125 MG per tablet Take 1 tablet by mouth 2 (two) times daily. Patient not taking: Reported on 04/12/2015 04/11/15   Clydia Llano, MD   Physical Exam: Filed Vitals:   04/12/15 0337  BP: 173/90  Pulse: 84  Temp: 97.5 F (36.4 C)  Resp: 18    BP 173/90 mmHg  Pulse 84  Temp(Src) 97.5 F (36.4 C) (Oral)  Resp 18  Ht  (1.803 m)  Wt 72.576 kg (160 lb)  BMI 22.33 kg/m2  SpO2 99%  General Appearance:    Alert, oriented, no distress, appears stated age  Head:    Normocephalic, atraumatic  Eyes:    PERRL, EOMI, sclera non-icteric        Nose:   Nares without drainage or epistaxis. Mucosa, turbinates normal  Throat:   Moist mucous membranes. Oropharynx without erythema or exudate.  Neck:   Supple. No carotid bruits.  No thyromegaly.  No lymphadenopathy.   Back:     No CVA tenderness, no spinal  tenderness  Lungs:     Clear to auscultation bilaterally, without wheezes, rhonchi or rales  Chest wall:    No tenderness to palpitation  Heart:    Regular rate and rhythm without murmurs, gallops, rubs  Abdomen:     Soft, non-tender, nondistended, normal bowel sounds, no organomegaly  Genitalia:    deferred  Rectal:    deferred  Extremities:   No clubbing, cyanosis or edema.  Pulses:   2+ and symmetric all extremities  Skin:   Skin color, texture, turgor normal, no rashes or lesions  Lymph nodes:   Cervical, supraclavicular, and axillary nodes normal  Neurologic:   CNII-XII intact. Normal strength, sensation and reflexes      throughout    Labs on Admission:  Basic Metabolic Panel:  Recent Labs Lab 04/08/15 0421 04/09/15 1220 04/10/15 0504 04/11/15 0630 04/11/15 2347  NA 135 137 138 140 135  K 4.8 4.4 4.0 3.9 3.4*  CL 102 109 108 109 105  CO2 28 25 22 23 23   GLUCOSE 107* 147* 108* 95 114*  BUN 16 9 11 12 15   CREATININE 0.95 0.85 1.12 0.97 0.94  CALCIUM 10.6* 11.6* 11.9* 11.5* 11.4*  PHOS  --   --  1.2* 2.8  --    Liver Function Tests:  Recent Labs Lab 04/05/15 0440 04/07/15 0359 04/08/15 0421 04/10/15 0504 04/11/15 0630  AST 68* 60* 54*  --   --   ALT 83* 59 50  --   --   ALKPHOS 82 64 69  --   --   BILITOT 1.2 1.1 1.3*  --   --   PROT 5.8* 5.1* 5.6*  --   --   ALBUMIN 3.2* 2.6* 2.7* 2.7* 2.9*   No results for input(s): LIPASE, AMYLASE in the last 168 hours.  Recent Labs Lab 04/07/15 1210  AMMONIA 28   CBC:  Recent Labs Lab 04/07/15 0359 04/08/15 0421 04/09/15 1220 04/11/15 0630 04/11/15 2347  WBC 5.2 7.3 8.8 8.2 7.7  NEUTROABS  --   --  7.1  --  6.0  HGB 8.2* 8.6* 8.2* 8.6* 9.4*  HCT 25.3* 26.9* 25.5* 26.5* 28.5*  MCV 105.0* 106.7* 104.5* 102.3* 102.9*  PLT 97* 116* 157 193 200   Cardiac Enzymes: No results for input(s): CKTOTAL, CKMB, CKMBINDEX, TROPONINI in the last 168 hours.  BNP (last 3 results) No results for input(s): PROBNP in  the last 8760 hours. CBG: No results for input(s): GLUCAP in the last 168 hours.  Radiological Exams on Admission: Ct Head Wo Contrast  04/12/2015   CLINICAL DATA:  54 year old male with fall  EXAM: CT HEAD WITHOUT CONTRAST  TECHNIQUE: Contiguous axial images were obtained from the base of the skull through the vertex without intravenous contrast.  COMPARISON:  Head CT dated 03/12/2013  FINDINGS: There is slight prominence of the sulci compatible with age-related volume loss. Minimal periventricular and deep white matter hypodensities represent chronic microvascular ischemic changes. There is no intracranial hemorrhage. No mass effect or midline shift identified.  The visualized paranasal sinuses and mastoid air cells are well aerated. The calvarium is intact.  IMPRESSION: No acute intracranial pathology.   Electronically Signed   By: Elgie Collard M.D.   On: 04/12/2015 00:40   Ct Lumbar Spine Wo Contrast  04/12/2015   CLINICAL DATA:  54 year old male status post fall. History of back surgery last week.  EXAM: CT LUMBAR SPINE WITHOUT CONTRAST  TECHNIQUE: Multidetector CT imaging of the lumbar spine was performed without intravenous contrast administration. Multiplanar CT image reconstructions were also generated.  COMPARISON:  Radiograph dated 04/09/2015 and lumbar MRI dated 03/29/2015  FINDINGS: No acute fracture or subluxation of the lumbar spine. There are postsurgical changes of L3-L4 laminectomy and posterior fixation hardware. Small pockets of air noted at the operative bed. No definite drainable fluid collection identified. Evaluation of this area is limited in the absence of contrast as well as due to streak artifact caused by metallic screws. There is evidence of prior L4-5 fusion.  Punctate nonobstructing right renal calculi. No hydronephrosis. Aortoiliac  atherosclerotic disease.  IMPRESSION: Postoperative changes of lower lumbar spine. No acute fracture or subluxation.   Electronically Signed    By: Elgie Collard M.D.   On: 04/12/2015 00:47   Dg Hip Unilat With Pelvis 2-3 Views Left  04/12/2015   CLINICAL DATA:  [place] to the ground tonight. Unwitnessed fall. Left hip pain and confusion. Soft tissue bruising.  EXAM: DG HIP (WITH OR WITHOUT PELVIS) 2-3V LEFT  COMPARISON:  None.  FINDINGS: Bilateral total hip arthroplasties using non cemented components. The left hip demonstrates a periprosthetic fracture involving the inter trochanteric region and greater trochanteric region. No significant displacement of fracture fragments. No dislocation of the hip arthroplasty.  Visualized portion of right hip arthroplasty appears intact. Heterotopic ossification over the greater trochanter. Pelvis appears intact. Postoperative changes in the lower lumbar spine.  IMPRESSION: Left total hip arthroplasty with acute appearing fracture of the inter trochanteric/greater trochanteric region of the left hip. No displacement or dislocation.   Electronically Signed   By: Burman Nieves M.D.   On: 04/12/2015 01:04    EKG: Independently reviewed.  Assessment/Plan Principal Problem:   Femur fracture, left Active Problems:   Bipolar disorder   Essential hypertension   Uncomplicated alcohol dependence   1. Left femur fracture - 1. Hip fx pathway 2. Ortho to see in AM (EDP spoke with Dr. Eulah Pont) 3. Pain ctrl per pathway 4. NPO 5. SCDs only for DVT ppx at this time (due to recent CNS surgery done just 7 days ago) 2. Fever during last admit - continue Augmentin he was discharged on for 3 more days 3. HTN - continue home meds 4. BPD - continue lithium    Code Status: Full Code  Family Communication: No family in room Disposition Plan: Admit to inpatient   Time spent: 70 min  GARDNER, JARED M. [facility] Pager [phone]  If 7AM-7PM, please contact the day team taking care of the patient [email or web address] Password [email or web address] 04/12/2015, 3:45 AM

## 2015-04-12 NOTE — Progress Notes (Addendum)
[facility] PROGRESS NOTE  [name] ZOX:[id number] DOB: [birth year] DOA: 04/11/2015 PCP: Arlan Organ., MD  Brief Summary  The patient is a 54 yo M with history of alcohol abuse, supposedly in remission since 11/2002 but later reported drinking 4 beers daily and vodka intermittently, peripheral neuropathy, bipolar disorder on lithium, hepatitis C with liver cirrhosis, GERD, chronic pain, hypertension, possible TIA.  He was he was hospitalized from 04/04/2015 through 04/11/2015 secondary to acute on chronic low back pain with sciatica. His MRI demonstrated severe degenerative joint disease from L3-L4 with severe spinal stenosis and inferior endplate Schmorl's node at T12.  He was seen by neurosurgery and underwent L4-L5 decompressive lumbar laminectomy, bilateral L4-L5 facetectomy, foraminotomies with decompression beyond that required for interbody arthrodesis.  He had L4-5 posterior l umbar interbody arthrodesis with AVS peek interbody implants, Vitoss BA with bone marrow aspirate and infusion by Drs Newell Coral and Venetia Maxon on 9/1.  His pain improved, however, his postoperative course was complicated by delirium and agitation attributed to alcohol withdrawal.  He had pancytopenia and macrocytosis suggestive of chronic alcohol abuse.  Lithium level during previous admission was less than threshold suggesting possible noncompliance with therapy or subtherapeutic dose.  His dose was not adjusted during that admission.  He also had a low grade fever that was attributed to possible aspiration pneumonia.  At the time of discharge, his fever had resolved, he was able to carry on a conversation with the discharging physician and had been recommended for SNF for rehab.  He was discharged to [facility] where he had a fall within a few hours of arrival and he was transported back to the ER for reevaluation.  He has history of bilateral total hip replacements.  His CT lumbar spine demonstrated no evidence of lower  spine fracture or subluxation, head CT was negative, but XR hips demonstrated a left total hip arthroplasty with acute appearing fracture of the intertrochanteric/greater trochanteric region of the left hip without displacement or dislocation.  He was admitted for further evaluation by orthopedic surgery.    Assessment/Plan  Left hip intertrochanteric/greater trochanter fracture -  Falls precautions -  Appreciate orthopedics assistance, no plan for imminent surgery -  Resume diet -  CT hip pending -  Continue NWB LLE pending CT but may be able to progress to WBAT  -  Acute on chronic pain, change hydrocodone to oxycodone prn -  Continue morphine for breakthrough pain  Delirium, tremor.  DDx includes ongoing symptoms of alcohol withdrawal, delirium secondary to severe illness and prolonged hospitalization, clonidine cessation, lithium toxicity, hepatic encephalopathy.  Confusion led to a fall today.   -  Check ammonia, HIV, RPR, vitamin B12, TSH, lithium level -  Hold lithium this evening pending lithium level -  ECG to check QTc -  Start CIWA protocol -  Resume lower dose clonidine at 0.1mg  BID -  Fall precautions -  Sitter to bedside for safety -  Start lactulose empirically for now  Likely dehydration from not eating or drinking in the last 12 hours -  Start IVF with potassium  Hypercalcemia, likely related to recent bone surgery and now hip fracture -  Check phos, PTH, and vitamin D levels  Alcohol abuse/hepatitis C/cirrhosis - Patient continues to drink up to 4 beers daily and vodka occasionally. - CIWA protocol, initiated after surgery. - no recent imaging of the liver, consider as outpatient  Macrocytic anemia - Likely related to ongoing alcohol abuse related bone marrow toxicity and cirrhosis. -  check iron studies -  Repeat TSH and free T4 which were abnormal a few days ago in 2-3 weeks (likely abnormal due to recent surgery) -  Vitamin b12 and folate were wnl on  04/07/2015  Essential hypertension with very elevated blood pressures -  Continue norvasc -  Resume some clonidine -  Add prn hydralazine  Bipolar disorder - holding lithium pending lithium level -  May need psychiatry consultation  Psoriasis - Patient has extensive skin psoriatic lesions on his back, limbs. Outpatient follow-up.  Aspiration pneumonia -  Continue Augmentin to complete a 7-day course of antibiotics  Diet:  Low sodium  Access:  PIV IVF:  yes Proph:  lovenox  Code Status: full Family Communication: patient and his wife.  Wife is upset because of a number of things.  She feels that he was not ready to be discharged yesterday and points to the patient's fall at the SNF as evidence to prove her point.  She is also concerned that he has not had anything to eat or drink in 18 hours and that he is not on IVF.   Disposition Plan: pending further evaluation of the hip fracture by CT, mentation improved, likely discharge back to SNF.    Consultants:  [facility]  Procedures:  CT lumbar spine  CT head  X-ray pelvis with left hip  CT left hip pending  Antibiotics:  Augmentin   HPI/Subjective:  Patient is confused. He denies any pain in his left hip but states that he has chronic abdominal pain. He feels thirsty. He denies nausea, vomiting, dysuria, cough, shortness of breath  Objective: Filed Vitals:   04/12/15 0215 04/12/15 0230 04/12/15 0245 04/12/15 0337  BP: 169/93 152/99 169/91 173/90  Pulse: 104 100 89 84  Temp:    97.5 F (36.4 C)  TempSrc:    Oral  Resp: Height:      Weight:      SpO2: 100% 99% 100% 99%    Intake/Output Summary (Last 24 hours) at 04/12/15 1605 Last data filed at 04/12/15 0646  Gross per 24 hour  Intake      5 ml  Output      0 ml  Net      5 ml   Filed Weights   04/11/15 2310  Weight: 72.576 kg (160 lb)   Body mass index is 22.33 kg/(m^2).  Exam:   General:  Adult male, No acute  distress  HEENT:  NCAT, MMM  Cardiovascular:  RRR, nl S1, S2 no mrg, 2+ pulses, warm extremities  Respiratory:  CTAB, no increased WOB  Abdomen:   NABS, soft, NT/ND  MSK:   Normal tone and bulk, no LEE, states that he has weakness when trying to move the left hip but denies pain in the left hip area and is able to flex his hip, extend and flex his knee, and internally and externally rotate  Neuro:  Grossly intact, possible asterixis and essential tremor, no tongue fasciculations  Data Reviewed: Basic Metabolic Panel:  Recent Labs Lab 04/08/15 0421 04/09/15 1220 04/10/15 0504 04/11/15 0630 04/11/15 2347  NA 135 137 138 140 135  K 4.8 4.4 4.0 3.9 3.4*  CL 102 109 108 109 105  CO2 GLUCOSE 107* 147* 108* 95 114*  BUN CREATININE 0.95 0.85 1.12 0.97 0.94  CALCIUM 10.6* 11.6* 11.9* 11.5* 11.4*  PHOS  --   --  1.2* 2.8  --    Liver Function Tests:  Recent Labs Lab 04/07/15 0359 04/08/15 0421 04/10/15 0504 04/11/15 0630  AST 60* 54*  --   --   ALT 59 50  --   --   ALKPHOS 64 69  --   --   BILITOT 1.1 1.3*  --   --   PROT 5.1* 5.6*  --   --   ALBUMIN 2.6* 2.7* 2.7* 2.9*   No results for input(s): LIPASE, AMYLASE in the last 168 hours.  Recent Labs Lab 04/07/15 1210  AMMONIA 28   CBC:  Recent Labs Lab 04/07/15 0359 04/08/15 0421 04/09/15 1220 04/11/15 0630 04/11/15 2347  WBC 5.2 7.3 8.8 8.2 7.7  NEUTROABS  --   --  7.1  --  6.0  HGB 8.2* 8.6* 8.2* 8.6* 9.4*  HCT 25.3* 26.9* 25.5* 26.5* 28.5*  MCV 105.0* 106.7* 104.5* 102.3* 102.9*  PLT 97* 116* 157 193 200    Recent Results (from the past 240 hour(s))  Surgical pcr screen     Status: Abnormal   Collection Time: 04/05/15 11:19 PM  Result Value Ref Range Status   MRSA, PCR NEGATIVE NEGATIVE Final   Staphylococcus aureus POSITIVE (A) NEGATIVE Final    Comment:        The Xpert SA Assay (FDA approved for NASAL specimens in patients over 65 years of age), is one  component of a comprehensive surveillance program.  Test performance has been validated by [facility] for patients greater than or equal to 64 year old. It is not intended to diagnose infection nor to guide or monitor treatment.   Culture, blood (routine x 2)     Status: None   Collection Time: 04/07/15  8:17 AM  Result Value Ref Range Status   Specimen Description BLOOD LEFT ARM  Final   Special Requests BOTTLES DRAWN AEROBIC AND ANAEROBIC 10CC  Final   Culture NO GROWTH 5 DAYS  Final   Report Status 04/12/2015 FINAL  Final  Culture, blood (routine x 2)     Status: None   Collection Time: 04/07/15  8:25 AM  Result Value Ref Range Status   Specimen Description BLOOD LEFT ARM  Final   Special Requests BOTTLES DRAWN AEROBIC AND ANAEROBIC 10CC  Final   Culture NO GROWTH 5 DAYS  Final   Report Status 04/12/2015 FINAL  Final  Urine culture     Status: None   Collection Time: 04/07/15 12:10 PM  Result Value Ref Range Status   Specimen Description URINE, RANDOM  Final   Special Requests NONE  Final   Culture NO GROWTH 1 DAY  Final   Report Status 04/08/2015 FINAL  Final     Studies: Ct Head Wo Contrast  04/12/2015   CLINICAL DATA:  54 year old male with fall  EXAM: CT HEAD WITHOUT CONTRAST  TECHNIQUE: Contiguous axial images were obtained from the base of the skull through the vertex without intravenous contrast.  COMPARISON:  Head CT dated 03/12/2013  FINDINGS: There is slight prominence of the sulci compatible with age-related volume loss. Minimal periventricular and deep white matter hypodensities represent chronic microvascular ischemic changes. There is no intracranial hemorrhage. No mass effect or midline shift identified.  The visualized paranasal sinuses and mastoid air cells are well aerated. The calvarium is intact.  IMPRESSION: No acute intracranial pathology.   Electronically Signed   By: Elgie Collard M.D.   On: 04/12/2015 00:40   Ct Lumbar Spine Wo Contrast  04/12/2015    CLINICAL DATA:  54 year old male status post fall. History of back surgery last week.  EXAM: CT LUMBAR SPINE WITHOUT CONTRAST  TECHNIQUE: Multidetector CT imaging of the lumbar spine was performed without intravenous contrast administration. Multiplanar CT image reconstructions were also generated.  COMPARISON:  Radiograph dated 04/09/2015 and lumbar MRI dated 03/29/2015  FINDINGS: No acute fracture or subluxation of the lumbar spine. There are postsurgical changes of L3-L4 laminectomy and posterior fixation hardware. Small pockets of air noted at the operative bed. No definite drainable fluid collection identified. Evaluation of this area is limited in the absence of contrast as well as due to streak artifact caused by metallic screws. There is evidence of prior L4-5 fusion.  Punctate nonobstructing right renal calculi. No hydronephrosis. Aortoiliac atherosclerotic disease.  IMPRESSION: Postoperative changes of lower lumbar spine. No acute fracture or subluxation.   Electronically Signed   By: Elgie Collard M.D.   On: 04/12/2015 00:47   Dg Hip Unilat With Pelvis 2-3 Views Left  04/12/2015   CLINICAL DATA:  Larey Seat to the ground tonight. Unwitnessed fall. Left hip pain and confusion. Soft tissue bruising.  EXAM: DG HIP (WITH OR WITHOUT PELVIS) 2-3V LEFT  COMPARISON:  None.  FINDINGS: Bilateral total hip arthroplasties using non cemented components. The left hip demonstrates a periprosthetic fracture involving the inter trochanteric region and greater trochanteric region. No significant displacement of fracture fragments. No dislocation of the hip arthroplasty.  Visualized portion of right hip arthroplasty appears intact. Heterotopic ossification over the greater trochanter. Pelvis appears intact. Postoperative changes in the lower lumbar spine.  IMPRESSION: Left total hip arthroplasty with acute appearing fracture of the inter trochanteric/greater trochanteric region of the left hip. No displacement or  dislocation.   Electronically Signed   By: Burman Nieves M.D.   On: 04/12/2015 01:04    Scheduled Meds: . amLODipine  10 mg Oral Daily  . amoxicillin-clavulanate  1 tablet Oral BID  . aspirin EC  81 mg Oral Daily  . cloNIDine  0.1 mg Oral BID  . [START ON 04/13/2015] feeding supplement (ENSURE ENLIVE)  237 mL Oral BID BM  . folic acid  1 mg Oral Daily  . lactulose  30 g Oral BID  . lithium carbonate  900 mg Oral QHS  . LORazepam  0-4 mg Intravenous Q6H   Followed by  . [START ON 04/14/2015] LORazepam  0-4 mg Intravenous Q12H  . multivitamin with minerals  1 tablet Oral Daily  . pantoprazole  40 mg Oral BID AC  . thiamine  100 mg Oral Daily   Or  . thiamine  100 mg Intravenous Daily  . traZODone  200 mg Oral QHS  . triamcinolone ointment  1 application Topical BID  . triamcinolone ointment  1 application Topical BID   Continuous Infusions: . dextrose 5 % and 0.45 % NaCl with KCl 10 mEq/L      Principal Problem:   Femur fracture, left Active Problems:   Bipolar disorder   Essential hypertension   Uncomplicated alcohol dependence   Malnutrition of moderate degree    Time spent: 30 min    Tabetha Haraway  [facility] Pager [phone]. If 7PM-7AM, please contact night-coverage at [email or web address], password [facility] 04/12/2015, 4:05 PM  LOS: 0 days

## 2015-04-12 NOTE — Progress Notes (Signed)
Initial Nutrition Assessment  DOCUMENTATION CODES:   Non-severe (moderate) malnutrition in context of chronic illness  INTERVENTION:   Once diet advances, provide Ensure Enlive po BID, each supplement provides 350 kcal and 20 grams of protein.  NUTRITION DIAGNOSIS:   Malnutrition related to chronic illness as evidenced by moderate depletions of muscle mass, moderate depletion of body fat.  GOAL:   Patient will meet greater than or equal to 90% of their needs  MONITOR:   Diet advancement, Weight trends, Labs, I & O's  REASON FOR ASSESSMENT:   Consult Hip fracture protocol  ASSESSMENT:   54 y.o. male who was just on our service from 8/30 to yesterday 9/6 for back pain ultimately treated with lumbar fusion. That admission was complicated by AMS, and fever. He was ultimately discharged on 3 more days of augmentin for his fever yesterday. After his discharge he went to the SNF where he was going to begin rehab, and just a few hours later had a fall. He was brought in to the ED and found to have broken his left hip.  Pt is currently NPO. Pt does report being hungry during time of visit. PTA pt reports not eating well with last full meal at his SNF however pt reports only eating 50%. Weight stable. Pt is agreeable to nutritional supplements once diet advances. RD to order.   Nutrition-Focused physical exam completed. Findings are moderate fat depletion, moderate muscle depletion, and mild edema.   Labs and medications reviewed.   Diet Order:  Diet NPO time specified  Skin:   (Incision on back, non-pitting LLE edema)  Last BM:  PTA  Height:   Ht Readings from Last 1 Encounters:  04/11/15  (1.803 m)    Weight:   Wt Readings from Last 1 Encounters:  04/11/15 160 lb (72.576 kg)    Ideal Body Weight:  78 kg  BMI:  Body mass index is 22.33 kg/(m^2).  Estimated Nutritional Needs:   Kcal:  2000-2200  Protein:  95-105 grams  Fluid:  2-2.2 L/day  EDUCATION  NEEDS:   No education needs identified at this time  Roslyn Smiling, MS, RD, LDN Pager # [phone] After hours/ weekend pager # [phone]

## 2015-04-12 NOTE — Progress Notes (Signed)
CRITICAL VALUE ALERT  Critical value received:  Lactic acid 2.1  Date of notification:  04/12/2015  Time of notification:  1941  Critical value read back:Yes.    Nurse who received alert:  Ricky Stabs, RN  MD notified (1st page):  [facility]- Schorr, NP  Time of first page:  1943  MD notified (2nd page): [facility]- Schorr, NP  Time of second page: 2013  Responding MD:  No response from MD  Time MD responded:  =n/a

## 2015-04-12 NOTE — Progress Notes (Signed)
Patients brother Jatniel Verastegui called to get an update from RN about the status of the patient. RN informed [name] that when she arrived to the shift at 1900 the patient was asleep and since then has awaken. RN informed [name] that the patient was agitated and anxious upon waking up refusing to stay in the bed. RN informed [name] that the patient currently has a Recruitment consultant in the room to promote his safety. [name] informed RN that he may call the patients room to speak with him in hopes of possibly trying to calm the patient. RN informed [name] that he can feel free to call her throughout the night to inquire about any additional updates and that the RN will call him with any significant updates or changes within the patients plan of care. RN informed [name] that the patient has not sustained any additional injuries from the fall that occurred by the patient in the patients room prior to 1900. RN was informed at shift change that the patients wife and MD have been notified of the fall occurrence and that there are no changes in the patients plan of care. Nursing will continue to monitor.

## 2015-04-13 ENCOUNTER — Telehealth: Payer: Self-pay | Admitting: *Deleted

## 2015-04-13 ENCOUNTER — Inpatient Hospital Stay (HOSPITAL_COMMUNITY): Payer: Medicare Other

## 2015-04-13 ENCOUNTER — Encounter: Payer: Self-pay | Admitting: *Deleted

## 2015-04-13 ENCOUNTER — Ambulatory Visit (INDEPENDENT_AMBULATORY_CARE_PROVIDER_SITE_OTHER): Payer: Self-pay | Admitting: Neurology

## 2015-04-13 DIAGNOSIS — R410 Disorientation, unspecified: Secondary | ICD-10-CM

## 2015-04-13 DIAGNOSIS — M545 Low back pain: Secondary | ICD-10-CM

## 2015-04-13 DIAGNOSIS — F319 Bipolar disorder, unspecified: Secondary | ICD-10-CM

## 2015-04-13 DIAGNOSIS — I10 Essential (primary) hypertension: Secondary | ICD-10-CM

## 2015-04-13 DIAGNOSIS — G8929 Other chronic pain: Secondary | ICD-10-CM

## 2015-04-13 LAB — CBC
HCT: 29.1 % — ABNORMAL LOW (ref 39.0–52.0)
HEMATOCRIT: 28.5 % — AB (ref 39.0–52.0)
HEMOGLOBIN: 9.4 g/dL — AB (ref 13.0–17.0)
Hemoglobin: 10.1 g/dL — ABNORMAL LOW (ref 13.0–17.0)
MCH: 34.5 pg — AB (ref 26.0–34.0)
MCH: 33.3 pg (ref 26.0–34.0)
MCHC: 34.7 g/dL (ref 30.0–36.0)
MCHC: 33 g/dL (ref 30.0–36.0)
MCV: 99.3 fL (ref 78.0–100.0)
MCV: 101.1 fL — AB (ref 78.0–100.0)
PLATELETS: UNDETERMINED 10*3/uL (ref 150–400)
Platelets: 304 10*3/uL (ref 150–400)
RBC: 2.93 MIL/uL — ABNORMAL LOW (ref 4.22–5.81)
RBC: 2.82 MIL/uL — ABNORMAL LOW (ref 4.22–5.81)
RDW: 13.1 % (ref 11.5–15.5)
RDW: 12.6 % (ref 11.5–15.5)
WBC: 17.4 10*3/uL — ABNORMAL HIGH (ref 4.0–10.5)
WBC: 13.1 10*3/uL — ABNORMAL HIGH (ref 4.0–10.5)

## 2015-04-13 LAB — BASIC METABOLIC PANEL
Anion gap: 9 (ref 5–15)
BUN: 16 mg/dL (ref 6–20)
CALCIUM: 11 mg/dL — AB (ref 8.9–10.3)
CHLORIDE: 104 mmol/L (ref 101–111)
CO2: 21 mmol/L — AB (ref 22–32)
CREATININE: 0.67 mg/dL (ref 0.61–1.24)
GFR calc Af Amer: >60 mL/min (ref >60)
GFR calc non Af Amer: >60 mL/min (ref >60)
GLUCOSE: 108 mg/dL — AB (ref 65–99)
Potassium: 4.3 mmol/L (ref 3.5–5.1)
Sodium: 134 mmol/L — ABNORMAL LOW (ref 135–145)

## 2015-04-13 LAB — PTH, INTACT AND CALCIUM
CALCIUM TOTAL (PTH): 11.6 mg/dL — AB (ref 8.7–10.2)
PTH: 91 pg/mL — AB (ref 15–65)

## 2015-04-13 LAB — LACTIC ACID, PLASMA: LACTIC ACID, VENOUS: 2.1 mmol/L — AB (ref 0.5–2.0)

## 2015-04-13 LAB — HIV ANTIBODY (ROUTINE TESTING W REFLEX): HIV Screen 4th Generation wRfx: NONREACTIVE

## 2015-04-13 LAB — RPR: RPR: NONREACTIVE

## 2015-04-13 LAB — GLUCOSE, CAPILLARY: GLUCOSE-CAPILLARY: 150 mg/dL — AB (ref 65–99)

## 2015-04-13 LAB — VITAMIN D 25 HYDROXY (VIT D DEFICIENCY, FRACTURES): Vit D, 25-Hydroxy: 19.2 ng/mL — ABNORMAL LOW (ref 30.0–100.0)

## 2015-04-13 MED ORDER — THIAMINE HCL 100 MG/ML IJ SOLN
100.0000 mg | Freq: Every day | INTRAMUSCULAR | Status: DC
Start: 2015-04-13 — End: 2015-04-14

## 2015-04-13 MED ORDER — LACTULOSE ENEMA
300.0000 mL | Freq: Two times a day (BID) | ORAL | Status: DC
  Administered 2015-04-13: 300 mL via RECTAL
  Filled 2015-04-13 (×8): qty 300

## 2015-04-13 MED ORDER — CLONIDINE HCL 0.2 MG/24HR TD PTWK: 0.2000 mg | MEDICATED_PATCH | TRANSDERMAL | Status: DC

## 2015-04-13 MED ORDER — TRAZODONE HCL 100 MG PO TABS: 200.0000 mg | ORAL_TABLET | Freq: Every evening | ORAL | Status: DC | PRN

## 2015-04-13 MED ORDER — PANTOPRAZOLE SODIUM 40 MG IV SOLR
40.0000 mg | Freq: Two times a day (BID) | INTRAVENOUS | Status: DC
Start: 2015-04-13 — End: 2015-04-14
  Administered 2015-04-13 – 2015-04-14 (×2): 40 mg via INTRAVENOUS
  Filled 2015-04-13 (×4): qty 40

## 2015-04-13 MED ORDER — SODIUM CHLORIDE 0.9 % IV SOLN
1.0000 mg | Freq: Once | INTRAVENOUS | Status: AC
  Administered 2015-04-13: 1 mg via INTRAVENOUS
  Filled 2015-04-13: qty 0.2

## 2015-04-13 MED ORDER — OLANZAPINE 5 MG PO TBDP
10.0000 mg | ORAL_TABLET | Freq: Every day | ORAL | Status: DC
  Filled 2015-04-13: qty 2

## 2015-04-13 MED ORDER — RIFAXIMIN 550 MG PO TABS
550.0000 mg | ORAL_TABLET | Freq: Two times a day (BID) | ORAL | Status: DC
  Administered 2015-04-13 – 2015-04-18 (×10): 550 mg via ORAL
  Filled 2015-04-13 (×13): qty 1

## 2015-04-13 MED ORDER — OLANZAPINE 5 MG PO TBDP
10.0000 mg | ORAL_TABLET | Freq: Every day | ORAL | Status: DC
Start: 2015-04-13 — End: 2015-04-15
  Administered 2015-04-13 – 2015-04-14 (×2): 10 mg via ORAL
  Filled 2015-04-13 (×3): qty 2

## 2015-04-13 MED ORDER — SODIUM CHLORIDE 0.9 % IV SOLN
3.0000 g | Freq: Four times a day (QID) | INTRAVENOUS | Status: DC
  Administered 2015-04-13 – 2015-04-15 (×10): 3 g via INTRAVENOUS
  Filled 2015-04-13 (×18): qty 3

## 2015-04-13 MED ORDER — LITHIUM CARBONATE 300 MG PO CAPS
900.0000 mg | ORAL_CAPSULE | Freq: Every day | ORAL | Status: DC
  Administered 2015-04-13 – 2015-04-15 (×3): 900 mg via ORAL
  Filled 2015-04-13 (×4): qty 3

## 2015-04-13 MED ORDER — CLONIDINE HCL 0.1 MG/24HR TD PTWK
0.1000 mg | MEDICATED_PATCH | TRANSDERMAL | Status: DC
  Administered 2015-04-13: 0.1 mg via TRANSDERMAL
  Filled 2015-04-13: qty 1

## 2015-04-13 NOTE — Consult Note (Signed)
[facility] Face-to-Face Psychiatry Consult   Reason for Consult:  Bipolar agitation Referring Physician:  Dr. Sheran Fava Patient Identification: [name] MRN:  [id number] Principal Diagnosis: Bipolar disorder Diagnosis:   Patient Active Problem List   Diagnosis Date Noted  . Acute delirium [R41.0] 04/13/2015  . Femur fracture, left [S72.92XA] 04/12/2015  . Malnutrition of moderate degree [E44.0] 04/12/2015  . Lumbar stenosis with neurogenic claudication [M48.06] 04/08/2015  . Back pain [M54.9] 04/04/2015  . Lumbago [M54.5]   . Tobacco abuse [Z72.0]   . Uncomplicated alcohol dependence [F10.20]   . Other pancytopenia [D61.818]   . Stridor [R06.1] 03/12/2013  . Narcotic overdose [T40.601A] 03/12/2013  . Diarrhea [R19.7] 01/02/2012  . AVB (atrioventricular block) [I44.30] 01/02/2012  . Lithium toxicity [T56.891A] 01/02/2012  . Benzodiazepine (tranquilizer) overdose [T42.4X1A] 12/31/2011  . Hypotension due to drugs [I95.2] 12/31/2011  . Opioid abuse, episodic use [F11.10] 12/17/2011  . Bipolar 1 disorder, depressed, moderate [F31.32] 12/06/2011  . Generalized anxiety disorder [F41.1] 12/06/2011  . Alcohol dependence in remission [F10.21] 12/06/2011  . Movement disorder [G25.9] 11/30/2011  . DRUG OVERDOSE [T50.901A] 07/22/2007  . DISORDER, DENTAL NOS [K08.9] 05/21/2007  . ERECTILE DYSFUNCTION [F52.8] 11/25/2006  . SHOULDER PAIN, RIGHT, CHRONIC [M25.519] 11/25/2006  . PERIPHERAL NEUROPATHY [G60.9] 08/17/2006  . TACHYCARDIA, HX OF [Z86.79] 08/17/2006  . HEPATITIS C [B17.10] 06/23/2006  . DISEASE, INFECTIOUS/PARASITIC NOS [B99.9] 06/23/2006  . Bipolar disorder [F31.9] 06/23/2006  . TOBACCO ABUSE [Z72.0] 06/23/2006  . Essential hypertension [I10] 06/23/2006  . PSORIASIS [L40.8] 06/23/2006  . AVASCULAR NECROSIS [M87.9] 06/23/2006  . ALCOHOL ABUSE, HX OF [F10.21] 06/23/2006  . STATUS, SHOULDER JOINT REPLACEMENT [Z96.619] 06/23/2006  . Hip joint replacement by other means [Z96.60]  06/23/2006  . APPENDECTOMY, HX OF [Z90.89] 06/23/2006    Total Time spent with patient: 1 hour  Subjective:   [name] is a 54 y.o. male patient admitted with bipolar agitation.  HPI:  [name] is a 54 yo  male seen face-to-face for psychiatric evaluation and consultation for bipolar disorder with a depressed mood and agitation. Patient is currently sedated with medication management so he was not able to participate in psychiatric evaluation. Patient needed further evaluation when he was awake and able to participate appropriately. Most of the information for the safety evaluation obtained from the available medical records at this time. Patient has a history of alcohol abuse  in remission since 11/2002 but later reported drinking 4 beers daily and vodka intermittently, peripheral neuropathy, bipolar disorder on lithium, hepatitis C with liver cirrhosis, GERD, chronic pain, hypertension, possible TIA.Reportedly patient is currently in acute delirium while awake. Patient was he was hospitalized from 04/04/2015 through 04/11/2015 secondary to acute on chronic low back pain with sciatica. Lithium level is 0.27 which was less than therapeutic levels  suggesting possible noncompliance with therapy or subtherapeutic dose. He was discharged to [facility] where he had a fall within a few hours of arrival and he was transported back to the ER for reevaluation.   HPI Elements:   Location:  Bipolar depression. Quality:  Poor. Severity:  Unable to care for himself. Timing:  Noncompliant with medication. Duration:  Few weeks. Context:  Psychosocial stressors multiple medical problems.  Past Medical History:  Past Medical History  Diagnosis Date  . Tachycardia   . Peripheral neuropathy   . Alcohol abuse     hx of quit 4/04  . Bilateral external ear infections 2007  . Tobacco abuse   . Bipolar affective disorder  Followed by Dr. Wallace Going  . Avascular necrosis     SP right and left  hip replacements and L shoulder arthroplasty, secondary to steroid use.  Has been on embrel in past.  . Psoriasis     Previously on embrel, stopped because of price.   . Hypertension   . Hepatitis C   . ARF (acute renal failure) 2008    Multifactorial in setting of over use of ibuprofen and HCTZ./ACEI  . Cirrhosis   . Back pain   . Pneumonia     hx  . Heart murmur   . Stroke 11    ? "mini stroke"  . GERD (gastroesophageal reflux disease)     occ  . Arthritis   . Pharyngeal edema 03/12/2013  . Alcohol abuse   . Benzodiazepine abuse   . Carotid atherosclerosis   . Pectus excavatum     Past Surgical History  Procedure Laterality Date  . Right total hip replacement  2004    Due to AVN  . Left total hip replacement  2001    Due to AVN  . Closed manipulation, pinning, application of a traction pin right  2002  . Right shoulder arthroplasty      Due to AVN  . Appendectomy  1989  . Joint replacement Left 02    shoulder  . Tonsillectomy    . Hernia repair    . Anterior fusion cervical spine  03/10/2013  . Anterior cervical decomp/discectomy fusion N/A 03/10/2013    Procedure: Cervical Two-Three, Three to Four, Cervical Four to Five, Cervical Five to Six anterior cervical decompression with fusion plating and bonegraft;  Surgeon: Hosie Spangle, MD;  Location: [facility];  Service: Neurosurgery;  Laterality: N/A;  ANTERIOR CERVICAL DECOMPRESSION/DISCECTOMY FUSION 4 LEVELS  . Esophagogastroduodenoscopy N/A 04/11/2014    Procedure: ESOPHAGOGASTRODUODENOSCOPY (EGD);  Surgeon: Wonda Horner, MD;  Location: [facility];  Service: Endoscopy;  Laterality: N/A;  . Esophagogastroduodenoscopy N/A 04/11/2014    Procedure: ESOPHAGOGASTRODUODENOSCOPY (EGD);  Surgeon: Wonda Horner, MD;  Location: [facility];  Service: Endoscopy;  Laterality: N/A;   Family History:  Family History  Problem Relation Age of Onset  . Hypertension Mother   . Bipolar disorder Father   . Obesity Father   .  Obesity Brother    Social History:  History  Alcohol Use No    Comment: Previously alcoholic, denies ETOH for "past few weeks"     History  Drug Use No    Social History   Social History  . Marital Status: Married    Spouse Name: N/A  . Number of Children: N/A  . Years of Education: N/A   Social History Main Topics  . Smoking status: Current Every Day Smoker -- 1.00 packs/day for 35 years    Types: Cigarettes  . Smokeless tobacco: Never Used     Comment: Not interested in stopping.      i want to quit togeather with my wife "  . Alcohol Use: No     Comment: Previously alcoholic, denies ETOH for "past few weeks"  . Drug Use: No  . Sexual Activity: No   Other Topics Concern  . None   Social History Narrative   Patient has problems with his girlfriend who he refers to as an alcoholic.    Additional Social History:                          Allergies:  Allergies  Allergen Reactions  . Prochlorperazine Maleate Other (See Comments)    REACTION: jaw lock  . Erythromycin Other (See Comments)    REACTION: GI upset  . Compazine [Prochlorperazine Edisylate]     Jaw locks  . Hydromorphone Hcl Itching    REACTION: itching    Labs:  Results for orders placed or performed during the hospital encounter of 04/11/15 (from the past 48 hour(s))  CBC with Differential/Platelet     Status: Abnormal   Collection Time: 04/11/15 11:47 PM  Result Value Ref Range   WBC 7.7 4.0 - 10.5 K/uL   RBC 2.77 (L) 4.22 - 5.81 MIL/uL   Hemoglobin 9.4 (L) 13.0 - 17.0 g/dL   HCT 28.5 (L) 39.0 - 52.0 %   MCV 102.9 (H) 78.0 - 100.0 fL   MCH 33.9 26.0 - 34.0 pg   MCHC 33.0 30.0 - 36.0 g/dL   RDW 12.4 11.5 - 15.5 %   Platelets 200 150 - 400 K/uL   Neutrophils Relative % 78 (H) 43 - 77 %   Neutro Abs 6.0 1.7 - 7.7 K/uL   Lymphocytes Relative 14 12 - 46 %   Lymphs Abs 1.1 0.7 - 4.0 K/uL   Monocytes Relative 7 3 - 12 %   Monocytes Absolute 0.5 0.1 - 1.0 K/uL   Eosinophils Relative 1 0  - 5 %   Eosinophils Absolute 0.1 0.0 - 0.7 K/uL   Basophils Relative 0 0 - 1 %   Basophils Absolute 0.0 0.0 - 0.1 K/uL  Basic metabolic panel     Status: Abnormal   Collection Time: 04/11/15 11:47 PM  Result Value Ref Range   Sodium 135 135 - 145 mmol/L   Potassium 3.4 (L) 3.5 - 5.1 mmol/L   Chloride 105 101 - 111 mmol/L   CO2 23 22 - 32 mmol/L   Glucose, Bld 114 (H) 65 - 99 mg/dL   BUN 15 6 - 20 mg/dL   Creatinine, Ser 0.94 0.61 - 1.24 mg/dL   Calcium 11.4 (H) 8.9 - 10.3 mg/dL   GFR calc non Af Amer >60 >60 mL/min   GFR calc Af Amer >60 >60 mL/min    Comment: (NOTE) The eGFR has been calculated using the CKD EPI equation. This calculation has not been validated in all clinical situations. eGFR's persistently <60 mL/min signify possible Chronic Kidney Disease.    Anion gap 7 5 - 15  Protime-INR     Status: None   Collection Time: 04/11/15 11:47 PM  Result Value Ref Range   Prothrombin Time 15.0 11.6 - 15.2 seconds   INR 1.16 0.00 - 1.49  APTT     Status: None   Collection Time: 04/11/15 11:47 PM  Result Value Ref Range   aPTT 25 24 - 37 seconds  Urinalysis, Routine w reflex microscopic (not at [facility])     Status: Abnormal   Collection Time: 04/12/15 11:01 AM  Result Value Ref Range   Color, Urine AMBER (A) YELLOW    Comment: BIOCHEMICALS MAY BE AFFECTED BY COLOR   APPearance CLEAR CLEAR   Specific Gravity, Urine 1.023 1.005 - 1.030   pH 5.5 5.0 - 8.0   Glucose, UA NEGATIVE NEGATIVE mg/dL   Hgb urine dipstick NEGATIVE NEGATIVE   Bilirubin Urine SMALL (A) NEGATIVE   Ketones, ur NEGATIVE NEGATIVE mg/dL   Protein, ur NEGATIVE NEGATIVE mg/dL   Urobilinogen, UA 1.0 0.0 - 1.0 mg/dL   Nitrite NEGATIVE NEGATIVE   Leukocytes, UA NEGATIVE NEGATIVE  Comment: MICROSCOPIC NOT DONE ON URINES WITH NEGATIVE PROTEIN, BLOOD, LEUKOCYTES, NITRITE, OR GLUCOSE <1000 mg/dL.  Ammonia     Status: Abnormal   Collection Time: 04/12/15  3:31 PM  Result Value Ref Range   Ammonia 64 (H) 9 - 35  umol/L  Comprehensive metabolic panel     Status: Abnormal   Collection Time: 04/12/15  4:36 PM  Result Value Ref Range   Sodium 138 135 - 145 mmol/L   Potassium 3.5 3.5 - 5.1 mmol/L   Chloride 107 101 - 111 mmol/L   CO2 25 22 - 32 mmol/L   Glucose, Bld 108 (H) 65 - 99 mg/dL   BUN 18 6 - 20 mg/dL   Creatinine, Ser 0.81 0.61 - 1.24 mg/dL   Calcium 11.7 (H) 8.9 - 10.3 mg/dL   Total Protein 7.1 6.5 - 8.1 g/dL   Albumin 3.1 (L) 3.5 - 5.0 g/dL   AST 70 (H) 15 - 41 U/L   ALT 68 (H) 17 - 63 U/L   Alkaline Phosphatase 96 38 - 126 U/L   Total Bilirubin 1.5 (H) 0.3 - 1.2 mg/dL   GFR calc non Af Amer >60 >60 mL/min   GFR calc Af Amer >60 >60 mL/min    Comment: (NOTE) The eGFR has been calculated using the CKD EPI equation. This calculation has not been validated in all clinical situations. eGFR's persistently <60 mL/min signify possible Chronic Kidney Disease.    Anion gap 6 5 - 15  CBC     Status: Abnormal   Collection Time: 04/12/15  4:36 PM  Result Value Ref Range   WBC 10.8 (H) 4.0 - 10.5 K/uL   RBC 2.82 (L) 4.22 - 5.81 MIL/uL   Hemoglobin 9.3 (L) 13.0 - 17.0 g/dL   HCT 28.9 (L) 39.0 - 52.0 %   MCV 102.5 (H) 78.0 - 100.0 fL   MCH 33.0 26.0 - 34.0 pg   MCHC 32.2 30.0 - 36.0 g/dL   RDW 12.6 11.5 - 15.5 %   Platelets 271 150 - 400 K/uL  Lactic acid, plasma     Status: None   Collection Time: 04/12/15  4:36 PM  Result Value Ref Range   Lactic Acid, Venous 1.4 0.5 - 2.0 mmol/L  Lithium level     Status: Abnormal   Collection Time: 04/12/15  4:36 PM  Result Value Ref Range   Lithium Lvl 0.27 (L) 0.60 - 1.20 mmol/L  HIV antibody     Status: None   Collection Time: 04/12/15  4:36 PM  Result Value Ref Range   HIV Screen 4th Generation wRfx Non Reactive Non Reactive    Comment: (NOTE) Performed At: [facility] [place], [place] [id number] Lindon Romp MD [id number]   RPR     Status: None   Collection Time: 04/12/15  4:36 PM  Result Value Ref  Range   RPR Ser Ql Non Reactive Non Reactive    Comment: (NOTE) Performed At: [facility] [place], [place] [id number] Lindon Romp MD [id number]   Ferritin     Status: None   Collection Time: 04/12/15  4:36 PM  Result Value Ref Range   Ferritin 315 24 - 336 ng/mL  Iron and TIBC     Status: Abnormal   Collection Time: 04/12/15  4:36 PM  Result Value Ref Range   Iron 37 (L) 45 - 182 ug/dL   TIBC 344 250 - 450 ug/dL   Saturation Ratios 11 (  L) 17.9 - 39.5 %   UIBC 307 ug/dL  PTH, intact and calcium     Status: Abnormal   Collection Time: 04/12/15  4:36 PM  Result Value Ref Range   PTH 91 (H) 15 - 65 pg/mL   Calcium, Total (PTH) 11.6 (H) 8.7 - 10.2 mg/dL   PTH Comment     Comment: (NOTE) Interpretation                 Intact PTH    Calcium                                (pg/mL)      (mg/dL) Normal                          15 - 65     8.6 - 10.2 Primary Hyperparathyroidism         >65          >10.2 Secondary Hyperparathyroidism       >65          <10.2 Non-Parathyroid Hypercalcemia       <65          >10.2 Hypoparathyroidism                  <15          < 8.6 Non-Parathyroid Hypocalcemia    15 - 65          < 8.6 Performed At: [facility] [place], [place] [id number] Lindon Romp MD [id number]   Phosphorus     Status: None   Collection Time: 04/12/15  4:36 PM  Result Value Ref Range   Phosphorus 2.8 2.5 - 4.6 mg/dL  Vit D  25 hydroxy (rtn osteoporosis monitoring)     Status: Abnormal   Collection Time: 04/12/15  4:36 PM  Result Value Ref Range   Vit D, 25-Hydroxy 19.2 (L) 30.0 - 100.0 ng/mL    Comment: (NOTE) Vitamin D deficiency has been defined by the Institute of Medicine and an Endocrine Society practice guideline as a level of serum 25-OH vitamin D less than 20 ng/mL (1,2). The Endocrine Society went on to further define vitamin D insufficiency as a level between 21 and 29 ng/mL (2). 1. IOM (Institute of  Medicine). 2010. Dietary reference   intakes for calcium and D. [place]: The   [employer]. 2. Holick MF, [place], Bischoff-Ferrari HA, et al.   Evaluation, treatment, and prevention of vitamin D   deficiency: an Endocrine Society clinical practice   guideline. JCEM. 2011 Jul; 96(7):1911-30. Performed At: [facility] [place], [place] [id number] Lindon Romp MD [id number]   Lactic acid, plasma     Status: Abnormal   Collection Time: 04/12/15  5:55 PM  Result Value Ref Range   Lactic Acid, Venous 2.1 (HH) 0.5 - 2.0 mmol/L    Comment: CRITICAL RESULT CALLED TO, READ BACK BY AND VERIFIED WITH: E GOSS,RN [id number] 04/12/2015 WBOND   Basic metabolic panel     Status: Abnormal   Collection Time: 04/13/15  6:10 AM  Result Value Ref Range   Sodium 134 (L) 135 - 145 mmol/L   Potassium 4.3 3.5 - 5.1 mmol/L    Comment: SLIGHT HEMOLYSIS   Chloride 104 101 - 111 mmol/L   CO2 21 (L) 22 - 32 mmol/L  Glucose, Bld 108 (H) 65 - 99 mg/dL   BUN 16 6 - 20 mg/dL   Creatinine, Ser 0.67 0.61 - 1.24 mg/dL   Calcium 11.0 (H) 8.9 - 10.3 mg/dL   GFR calc non Af Amer >60 >60 mL/min   GFR calc Af Amer >60 >60 mL/min    Comment: (NOTE) The eGFR has been calculated using the CKD EPI equation. This calculation has not been validated in all clinical situations. eGFR's persistently <60 mL/min signify possible Chronic Kidney Disease.    Anion gap 9 5 - 15  CBC     Status: Abnormal (Preliminary result)   Collection Time: 04/13/15  6:10 AM  Result Value Ref Range   WBC PENDING 4.0 - 10.5 K/uL   RBC 2.93 (L) 4.22 - 5.81 MIL/uL   Hemoglobin 10.1 (L) 13.0 - 17.0 g/dL   HCT 29.1 (L) 39.0 - 52.0 %   MCV 99.3 78.0 - 100.0 fL   MCH 34.5 (H) 26.0 - 34.0 pg   MCHC 34.7 30.0 - 36.0 g/dL   RDW 13.1 11.5 - 15.5 %   Platelets PENDING 150 - 400 K/uL  Culture, blood (routine x 2)     Status: None (Preliminary result)   Collection Time: 04/13/15  8:46 AM  Result Value  Ref Range   Specimen Description BLOOD LEFT ANTECUBITAL    Special Requests BOTTLES DRAWN AEROBIC ONLY 5CC    Culture PENDING    Report Status PENDING   CBC     Status: Abnormal   Collection Time: 04/13/15  8:46 AM  Result Value Ref Range   WBC 13.1 (H) 4.0 - 10.5 K/uL   RBC 2.82 (L) 4.22 - 5.81 MIL/uL   Hemoglobin 9.4 (L) 13.0 - 17.0 g/dL   HCT 28.5 (L) 39.0 - 52.0 %   MCV 101.1 (H) 78.0 - 100.0 fL   MCH 33.3 26.0 - 34.0 pg   MCHC 33.0 30.0 - 36.0 g/dL   RDW 12.6 11.5 - 15.5 %   Platelets 304 150 - 400 K/uL    Vitals: Blood pressure 153/96, pulse 108, temperature 100.1 F (37.8 C), temperature source Oral, resp. rate 18, height _0  (1.803 m), weight 72.576 kg (160 lb), SpO2 94 %.  Risk to Self: Is patient at risk for suicide?: No Risk to Others:   Prior Inpatient Therapy:   Prior Outpatient Therapy:    Current Facility-Administered Medications  Medication Dose Route Frequency Provider Last Rate Last Dose  . amLODipine (NORVASC) tablet 10 mg  10 mg Oral Daily Etta Quill, DO   10 mg at 04/12/15 1025  . Ampicillin-Sulbactam (UNASYN) 3 g in sodium chloride 0.9 % 100 mL IVPB  3 g Intravenous Q6H Franky Macho, RPH      . aspirin EC tablet 81 mg  81 mg Oral Daily Etta Quill, DO   81 mg at 04/12/15 1026  . clobetasol ointment (TEMOVATE) 7.86 % 1 application  1 application Topical Daily PRN Etta Quill, DO      . cloNIDine (CATAPRES - Dosed in mg/24 hr) patch 0.1 mg  0.1 mg Transdermal Q Wed Janece Canterbury, MD      . dextrose 5 % and 0.45 % NaCl with KCl 10 mEq/L infusion   Intravenous Continuous Janece Canterbury, MD 100 mL/hr at 04/12/15 1814    . enoxaparin (LOVENOX) injection 40 mg  40 mg Subcutaneous Q24H Rebecka Apley, RPH   40 mg at 04/12/15 2142  . feeding supplement (ENSURE  ENLIVE) (ENSURE ENLIVE) liquid 237 mL  237 mL Oral BID BM Dale Wellfleet, RD      . folic acid (FOLVITE) tablet 1 mg  1 mg Oral Daily Janece Canterbury, MD   1 mg at 04/12/15 1815  .  hydrALAZINE (APRESOLINE) injection 10 mg  10 mg Intravenous Q4H PRN Janece Canterbury, MD      . lactulose (CHRONULAC) 10 GM/15ML solution 30 g  30 g Oral BID Janece Canterbury, MD   30 g at 04/12/15 1821  . lithium carbonate capsule 900 mg  900 mg Oral QHS Janece Canterbury, MD   900 mg at 04/13/15 0834  . LORazepam (ATIVAN) injection 0-4 mg  0-4 mg Intravenous Q6H Janece Canterbury, MD   2 mg at 04/13/15 0022   Followed by  . [START ON 04/14/2015] LORazepam (ATIVAN) injection 0-4 mg  0-4 mg Intravenous Q12H Janece Canterbury, MD      . LORazepam (ATIVAN) tablet 1 mg  1 mg Oral Q6H PRN Janece Canterbury, MD       Or  . LORazepam (ATIVAN) injection 1 mg  1 mg Intravenous Q6H PRN Janece Canterbury, MD   1 mg at 04/13/15 0324  . morphine 2 MG/ML injection 0.5 mg  0.5 mg Intravenous Q2H PRN Etta Quill, DO      . multivitamin with minerals tablet 1 tablet  1 tablet Oral Daily Janece Canterbury, MD   1 tablet at 04/12/15 1815  . OLANZapine zydis (ZYPREXA) disintegrating tablet 10 mg  10 mg Oral QHS Janece Canterbury, MD      . oxyCODONE (Oxy IR/ROXICODONE) immediate release tablet 5-10 mg  5-10 mg Oral Q4H PRN Janece Canterbury, MD   10 mg at 04/13/15 4656  . pantoprazole (PROTONIX) EC tablet 40 mg  40 mg Oral BID AC Etta Quill, DO   40 mg at 04/12/15 1814  . rifaximin (XIFAXAN) tablet 550 mg  550 mg Oral BID Janece Canterbury, MD      . thiamine (VITAMIN B-1) tablet 100 mg  100 mg Oral Daily Janece Canterbury, MD   100 mg at 04/12/15 1815   Or  . thiamine (B-1) injection 100 mg  100 mg Intravenous Daily Janece Canterbury, MD      . traZODone (DESYREL) tablet 200 mg  200 mg Oral QHS PRN Janece Canterbury, MD      . triamcinolone ointment (KENALOG) 0.1 % 1 application  1 application Topical BID Etta Quill, DO   1 application at 81/27/51 1029  . triamcinolone ointment (KENALOG) 0.5 % 1 application  1 application Topical BID Etta Quill, DO   1 application at 70/01/74 1030    Musculoskeletal: Strength & Muscle  Tone: decreased Gait & Station: unable to stand Patient leans: N/A  Psychiatric Specialty Exam: Physical Exam as per history and physical   ROS unable to perform at this time   Blood pressure 153/96, pulse 108, temperature 100.1 F (37.8 C), temperature source Oral, resp. rate 18, height _0  (1.803 m), weight 72.576 kg (160 lb), SpO2 94 %.Body mass index is 22.33 kg/(m^2).  General Appearance: NA  Eye Contact::  NA  Speech:  NA  Volume:  Not applicable  Mood:  NA  Affect:  NA  Thought Process:  NA  Orientation:  NA  Thought Content:  NA  Suicidal Thoughts:  No  Homicidal Thoughts:  No  Memory:  NA  Judgement:  NA  Insight:  NA  Psychomotor Activity:  NA  Concentration:  NA  Recall:  NA  Fund of Knowledge:NA  Language: NA  Akathisia:  NA  Handed:  Right  AIMS (if indicated):     Assets:  Others:  Unable to perform at this time  ADL's:  Impaired  Cognition: We be assessed at appropriate time   Sleep:      Treatment Plan Summary: Daily contact with patient to assess and evaluate symptoms and progress in treatment and Medication management  Plan:  Recommended to continue his current home medication for bipolar disorder which includes lithium carbonate 900 mg at bedtime, olanzapine side is 10 mg at bedtime and Seroquel 200 mg at bedtime and trazodone 200 mg at bedtime for bipolar disorder and agitation Monitor for lithium level for therapeutic benefits. Please reconsult when patient is able to participate in assessment Case discussed with the psychiatric social service Patient does not meet criteria for psychiatric inpatient admission. Supportive therapy provided about ongoing stressors.  Appreciate psychiatric consultation Please contact [phone] or [phone] if needs further assistance   Disposition: Patient to be reevaluated for safe disposition plans when he is able to participate in assessment   Shalyn Koral,JANARDHAHA R. 04/13/2015 11:24 AM

## 2015-04-13 NOTE — Progress Notes (Signed)
Patient endured a fall on 04/12/15 at approximately 1400. Patient was found on the floor in front of the bathroom. Patient was found crawling, per the NT. Patient stated, "I did not fall". No evidence of new injuries. Malachi Bonds, M MD notified. Safety Zone completed. Patient was attempting to ambulate unassisted, although patient had been educated by nurse about ambulating with assistance. Prior to the fall the patient was in bed with three side rails up, call bell within reach, bed alarm on, and the patient was asked if anything was needed before the nurse left the room. After the fall, the nurse re-educated the patient about the importance of calling for help when in need of ambulation. Before nurse left the room the bed alarm was set, call bell within reach, and three side rails were up while patient in bed. Will continue to monitor. Sitter requested.

## 2015-04-13 NOTE — Progress Notes (Signed)
     Subjective:  S/P L intertrochanteric fx of hip with hx of total arthroplasty.  Patient reports pain as moderate.  Patient had a fall during shift change last night.  Rapid response team was called.  Ct of hip reveals no intact alignment of both femoral and acetabular components.  No indications for surgical intervention at this time.  Will allow for touch down weight bearing with the assistance of a walker.   Objective:   VITALS:   Filed Vitals:   04/12/15 0230 04/12/15 0245 04/12/15 0337 04/12/15 2045  BP: 152/99 169/91 173/90 153/96  Pulse: 100 89 84 108  Temp:   97.5 F (36.4 C) 100.1 F (37.8 C)  TempSrc:   Oral Oral  Resp: Height:      Weight:      SpO2: 99% 100% 99% 94%   Patient still agitated and confused at times ABD soft Neurovascular intact Sensation intact distally Intact pulses distally Dorsiflexion/Plantar flexion intact   Lab Results  Component Value Date   WBC 10.8* 04/12/2015   HGB 9.3* 04/12/2015   HCT 28.9* 04/12/2015   MCV 102.5* 04/12/2015   PLT 271 04/12/2015   BMET    Component Value Date/Time   NA 134* 04/13/2015 0610   K 4.3 04/13/2015 0610   CL 104 04/13/2015 0610   CO2 21* 04/13/2015 0610   GLUCOSE 108* 04/13/2015 0610   BUN 16 04/13/2015 0610   CREATININE 0.67 04/13/2015 0610   CALCIUM 11.0* 04/13/2015 0610   CALCIUM 11.6* 04/12/2015 1636   GFRNONAA >60 04/13/2015 0610   GFRAA >60 04/13/2015 0610     Assessment/Plan:     Principal Problem:   Femur fracture, left Active Problems:   Bipolar disorder   Essential hypertension   Uncomplicated alcohol dependence   Malnutrition of moderate degree   Up with therapy Touch down weight bearing in the LLE No indications for surgical intervention.  Will continue to monitor fracture as outpatient with repeat xrays in 2 weeks.     Tinesha Siegrist Marie 04/13/2015, 9:00 AM Cell [phone]

## 2015-04-13 NOTE — Progress Notes (Addendum)
Patient was given Oxycodone 10 mg at 8:22 AM.  He took one of them but spit the other one out of his mouth.  I could not determine if this was a whole pill or part.  This was placed in sharp container.  Patient has been restless throughout the day. His wife was in the room and gave the patient a fruit smoothie, it has been difficult to get the patient to eat or drink. Dr. Malachi Bonds was consulted on his ammonia levels and his AST/ALT level.  His code status has been changed to DNR only, no palliative care .

## 2015-04-13 NOTE — Progress Notes (Signed)
Patients wife [name]) called RN near shift change to inquire about the status of the patient. RN informed wife that while patient is still anxious and agitated these symptoms were able to be somewhat managed throughout the night with administration of Ativan according to the CIWA protocol. Wife expressed that she would like to be updated if any changes in the patients plan of care changes. She was also concerned about whether surgery would be performed or not. RN informed patients wife that in reference to the latest doctors notes that the patient is not scheduled for surgery but that the status of the surgery case may change this morning upon MD rounds. RN informed patients wife that she will be updated if the patient has been recommended for surgery. Patients wife also expressed to the RN that she would like to add Jonny Ruiz (patients father who resides in [place] to the contact list approving that he may call and be updated on the patients status and condition). Patients wife expressed to RN that she will be taking her husband home at discharge (she does not want patient to return to [place]). Patients wife also expressed that she would like some assistance (possibly a home health aide/nurse) when the patient returns home to help the patient transition comfortably from hospital to home. RN addressed the fact that her husband fell on yesterday 9/7 and that he is doing fine after that occurrence and that there has been no additional damage done (Fall occurred prior to 1900 when night shift RN arrived). Patients wife told RN that she had no knowledge that the patient fell on yesterday at the hospital and that she only thought that he had fell at [facility] prior to admission to [facility]. RN informed charge RN of this update. Nursing will continue to monitor.

## 2015-04-13 NOTE — Progress Notes (Signed)
No show

## 2015-04-13 NOTE — Progress Notes (Signed)
Utilization review completed.  

## 2015-04-13 NOTE — Telephone Encounter (Signed)
no showed new patient appointment. 

## 2015-04-13 NOTE — Progress Notes (Signed)
Asked per Charge RN for opinion regarding Pt Med administration for CIWA protocol. Per RN Pt with worsening restlessness, only received  Ativan at 2145 ,  PRN DOSE at 2145 and  PO Oxycodone at 2351. RN advised to check VS and repeat CIWA scale now, if BP stable and CIWA scale indicates ok to give additional  per 1-4mg  Ativan range order now. RRT to follow up, RN advised to monitor closely and notify my self and provider for worsening changes.

## 2015-04-13 NOTE — Progress Notes (Signed)
This RN discussed pt's case with Rapid Response RN, particularly concerns regarding detox, agitation, and continued attempts to get out of bed. At this time, pt is without safety sitter, bed is in its lowest position and locked, bed alarm on, camera on, and mats placed around floor to provide additional patient safety. RR RN reviewed pt's MAR and medication administration this shift. RR RN suggested that nursing staff administer additional dose of IV ativan pending CIWA assessment and score, stating that pt had not received full  IV ativan as order was written. This RN discussed conversation and above suggestion with pt's primary RN. Primary RN to reassess CIWA and administer medication as necessary. Nursing will continue to monitor.

## 2015-04-13 NOTE — Progress Notes (Signed)
Patients RN, Consulting civil engineer, and two staff RN's transported patient from one bed (with defected bed exit alarm) to another bed (with properly working bed exit alarm) in order to promote patient safety. There are currently no available sitters in the house (patients sitter left at 2300). After all 4 RN's safely transported patient to the new bed the bed exit alarm system was checked and is currently working properly. Floor mats were also placed on both sides of the patients bed by patients RN. Patients RN was notified from charge RN that the Rapid RN had come to the floor to make rounds and approved patients RN to administer another 2 mg of Ativan to aide in the therapeutic management of patients CIWA protocol. RN initially administered 1 mg at 2142 and an additional 1 mg at 2145. Patients CIWA at 2149 was 11 which escalated to 16 at 0020. RN administered additional 2 mg of Ativan at 0022. Patient received 10 mg of Oxycodone at 2351 crushed in applesauce. Patient is currently resting. RN will reassess CIWA protocol at 0345. Nursing will continue to monitor.

## 2015-04-13 NOTE — Progress Notes (Signed)
ANTIBIOTIC CONSULT NOTE - INITIAL  Pharmacy Consult for Unasyn Indication: asp pna  Allergies  Allergen Reactions  . Prochlorperazine Maleate Other (See Comments)    REACTION: jaw lock  . Erythromycin Other (See Comments)    REACTION: GI upset  . Hydromorphone Hcl Itching    REACTION: itching    Patient Measurements: Height:  (180.3 cm) Weight: 160 lb (72.576 kg) IBW/kg (Calculated) : 75.3  Vital Signs: Temp: 100.1 F (37.8 C) (09/07 2045) Temp Source: Oral (09/07 2045) BP: 153/96 mmHg (09/07 2045) Pulse Rate: 108 (09/07 2045) Intake/Output from previous day: 09/07 0701 - 09/08 0700 In: 120 [P.O.:120] Out: 500 [Urine:500] Intake/Output from this shift:    Labs:  Recent Labs  04/11/15 0630 04/11/15 2347 04/12/15 1636 04/13/15 0610  WBC 8.2 7.7 10.8*  --   HGB 8.6* 9.4* 9.3*  --   PLT 193 200 271  --   CREATININE 0.97 0.94 0.81 0.67   Estimated Creatinine Clearance: 105.9 mL/min (by C-G formula based on Cr of 0.67). No results for input(s): VANCOTROUGH, VANCOPEAK, VANCORANDOM, GENTTROUGH, GENTPEAK, GENTRANDOM, TOBRATROUGH, TOBRAPEAK, TOBRARND, AMIKACINPEAK, AMIKACINTROU, AMIKACIN in the last 72 hours.   Microbiology: Recent Results (from the past 720 hour(s))  Surgical pcr screen     Status: Abnormal   Collection Time: 04/05/15 11:19 PM  Result Value Ref Range Status   MRSA, PCR NEGATIVE NEGATIVE Final   Staphylococcus aureus POSITIVE (A) NEGATIVE Final    Comment:        The Xpert SA Assay (FDA approved for NASAL specimens in patients over 36 years of age), is one component of a comprehensive surveillance program.  Test performance has been validated by [facility] for patients greater than or equal to 58 year old. It is not intended to diagnose infection nor to guide or monitor treatment.   Culture, blood (routine x 2)     Status: None   Collection Time: 04/07/15  8:17 AM  Result Value Ref Range Status   Specimen Description BLOOD LEFT ARM   Final   Special Requests BOTTLES DRAWN AEROBIC AND ANAEROBIC 10CC  Final   Culture NO GROWTH 5 DAYS  Final   Report Status 04/12/2015 FINAL  Final  Culture, blood (routine x 2)     Status: None   Collection Time: 04/07/15  8:25 AM  Result Value Ref Range Status   Specimen Description BLOOD LEFT ARM  Final   Special Requests BOTTLES DRAWN AEROBIC AND ANAEROBIC 10CC  Final   Culture NO GROWTH 5 DAYS  Final   Report Status 04/12/2015 FINAL  Final  Urine culture     Status: None   Collection Time: 04/07/15 12:10 PM  Result Value Ref Range Status   Specimen Description URINE, RANDOM  Final   Special Requests NONE  Final   Culture NO GROWTH 1 DAY  Final   Report Status 04/08/2015 FINAL  Final    Medical History: Past Medical History  Diagnosis Date  . Tachycardia   . Peripheral neuropathy   . Alcohol abuse     hx of quit 4/04  . Bilateral external ear infections 2007  . Tobacco abuse   . Bipolar affective disorder     Followed by Dr. [name]  . Avascular necrosis     SP right and left hip replacements and L shoulder arthroplasty, secondary to steroid use.  Has been on embrel in past.  . Psoriasis     Previously on embrel, stopped because of price.   Marland Kitchen  Hypertension   . Hepatitis C   . ARF (acute renal failure) 2008    Multifactorial in setting of over use of ibuprofen and HCTZ./ACEI  . Cirrhosis   . Back pain   . Pneumonia     hx  . Heart murmur   . Stroke 9    ? "mini stroke"  . GERD (gastroesophageal reflux disease)     occ  . Arthritis   . Pharyngeal edema 03/12/2013    Medications:  Prescriptions prior to admission  Medication Sig Dispense Refill Last Dose  . acetaminophen (TYLENOL) 325 MG tablet Take 2 tablets (650 mg total) by mouth every 4 (four) hours as needed. (Patient taking differently: Take 650 mg by mouth every 4 (four) hours as needed. )   UNK  . amLODipine (NORVASC) 5 MG tablet Take 2 tablets (10 mg total) by mouth daily. 60 tablet 0 unk  . aspirin  81 MG tablet Take 81 mg by mouth daily.   unk  . augmented betamethasone dipropionate (DIPROLENE-AF) 0.05 % ointment Apply 1 application topically 2 (two) times daily.  5 04/11/2015 at Unknown time  . clobetasol ointment (TEMOVATE) 0.05 % Apply 1 application topically daily as needed. Apply affected areas for psoriasis   04/11/2015 at Unknown time  . fluticasone (CUTIVATE) 0.005 % ointment Apply 1 application topically 2 (two) times daily.  5 04/11/2015 at Unknown time  . lithium carbonate 300 MG capsule Take 900 mg by mouth at bedtime.    UNK  . omeprazole (PRILOSEC) 20 MG capsule Take 1 capsule (20 mg total) by mouth 2 (two) times daily. 60 capsule 1 04/11/2015 at Unknown time  . oxyCODONE (OXY IR/ROXICODONE) 5 MG immediate release tablet Take 1-2 tablets (5-10 mg total) by mouth every 4 (four) hours as needed for moderate pain or breakthrough pain. 10 tablet 0 UNK  . oxyCODONE-acetaminophen (PERCOCET/ROXICET) 5-325 MG per tablet Take 1 tablet by mouth every 6 (six) hours as needed for moderate pain.   0 UNK  . traZODone (DESYREL) 100 MG tablet Take 200 mg by mouth at bedtime.   04/11/2015 at Unknown time  . amoxicillin-clavulanate (AUGMENTIN) 875-125 MG per tablet Take 1 tablet by mouth 2 (two) times daily. (Patient not taking: Reported on 04/12/2015) 6 tablet 0    Assessment: 54 y.o. male presents s/p fall with hip pain. Recently discharged 9/6 to SNF on Augmentin x 3 more days for fever. To begin Unasyn for asp pna. WBC slightly elevated to 10.8. SCr 0.67, est CrCl > 100 ml/min. Tm 100.1  Goal of Therapy:  Resolution of infection  Plan:  Unasyn 3gm IV q6h Will f/u micro data, pt's clinical condition, and renal function  Christoper Fabian, PharmD, BCPS Clinical pharmacist, pager [phone] 04/13/2015,7:24 AM

## 2015-04-13 NOTE — Progress Notes (Addendum)
[facility] PROGRESS NOTE  TOLBERT MATHESON ZOX:[id number] DOB: Feb 16, 1961 DOA: 04/11/2015 PCP: Arlan Organ., MD  Brief Summary  The patient is a 54 yo M with history of alcohol abuse, supposedly in remission since 11/2002 but later reported drinking 4 beers daily and vodka intermittently, peripheral neuropathy, bipolar disorder on lithium, hepatitis C with liver cirrhosis, GERD, chronic pain, hypertension, possible TIA.  He was he was hospitalized from 04/04/2015 through 04/11/2015 secondary to acute on chronic low back pain with sciatica. His MRI demonstrated severe degenerative joint disease from L3-L4 with severe spinal stenosis and inferior endplate Schmorl's node at T12.  He was seen by neurosurgery and underwent L4-L5 decompressive lumbar laminectomy, bilateral L4-L5 facetectomy, foraminotomies with decompression beyond that required for interbody arthrodesis.  He had L4-5 posterior l umbar interbody arthrodesis with AVS peek interbody implants, Vitoss BA with bone marrow aspirate and infusion by Drs Newell Coral and Venetia Maxon on 9/1.  His pain improved, however, his postoperative course was complicated by delirium and agitation attributed to alcohol withdrawal.  He had pancytopenia and macrocytosis suggestive of chronic alcohol abuse.  Lithium level during previous admission was less than threshold suggesting possible noncompliance with therapy or subtherapeutic dose.  His dose was not adjusted during that admission.  He also had a low grade fever that was attributed to possible aspiration pneumonia.  At the time of discharge, his fever had resolved, he was able to carry on a conversation with the discharging physician and had been recommended for SNF for rehab.  He was discharged to [facility] where he had a fall within a few hours of arrival and he was transported back to the ER for reevaluation.  He has history of bilateral total hip replacements.  His CT lumbar spine demonstrated no evidence of lower  spine fracture or subluxation, head CT was negative, but XR hips demonstrated a left total hip arthroplasty with acute appearing fracture of the intertrochanteric/greater trochanteric region of the left hip without displacement or dislocation.  He was admitted for further evaluation by orthopedic surgery.    Assessment/Plan  Left hip intertrochanteric/greater trochanter fracture with recurrent falls both in and out of hospital -  Falls precautions and sitter ordered, but not present -  Appreciate orthopedics assistance, no plan for imminent surgery -  CT hip:  Nondisplaced comminuted fracture of the greater trochanter of the left femur with intact arthroplasty components -  continue oxycodone prn -  Continue morphine for breakthrough pain  Delirium.  DDx includes ongoing symptoms of delirium secondary to severe illness and prolonged hospitalization, clonidine cessation, hepatic encephalopathy.  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  He has previously had elevated lithium levels and may have lingering tremor and confusion from earlier toxicity. -  Ammonia mildly elevated >> patient refusing lactulose -  Start rifaximin >> patient also refusing -  HIV NR, RPR NR, vitamin B12 wnl, TSH slightly abnormal 1 week ago and will need to be repeated in a few weeks -  Lithium level less than threshold -  Continue CIWA protocol -  Patient refusing clonidine >> start patch instead -  Resume zyprexa -  Sitter to bedside for safety -  Psychiatry consultation >> after reviewing his records, he has been on all sorts of antipsychotics and antidepressants and been seen by psychiatry before.    Likely dehydration from not eating or drinking in the last 12 hours -  Resume IVF  Hypercalcemia  likely due to primary hyperparathyroidism and recent bone surgery and now hip fracture, has been going on for years -  Phos wnl, PTH elevated and  vitamin D levels low normal -  Defer to PCP for referral for surgery if he recovers from this illness  Alcohol abuse/hepatitis C/cirrhosis - Patient continues to drink up to 4 beers daily and vodka occasionally. - CIWA protocol - no recent imaging of the liver, consider as outpatient  Macrocytic anemia -  Likely related to ongoing alcohol abuse related bone marrow toxicity and cirrhosis. -  Iron studies c/w possible iron deficiency vs. Chronic disease -  Start once daily iron supplementation -  Repeat TSH and free T4 which were abnormal a few days ago in 2-3 weeks (likely abnormal due to recent surgery) -  Vitamin b12 and folate were wnl on 04/07/2015  Essential hypertension with very elevated blood pressures -  refusing norvasc -  Increase clonidine patch -  Continue prn hydralazine  Bipolar disorder - resumed lithium -  Psychiatry consultation  Psoriasis - Patient has extensive skin psoriatic lesions on his back, limbs. Outpatient follow-up.  Aspiration pneumonia with low grade fever and patient refusing oral antibiotics.  UA negative. -  Start IV antibiotics   RUE swelling -  Duplex extremity  Diet:  Low sodium  Access:  PIV IVF:  yes Proph:  lovenox  Code Status:  DNR  Per conversation with wife.  Patient had previously asked for DNR and I agreed that given his frailty that that would be a good choice.   Family Communication: patient and brother who was at bedside.  Brother requested that he be involved in decision making, however, the patient's wife is the [facility] and patient does not have capacity at this time to give HPOA to anyone else.  Advised brother to hire a lawyer if he wants to pursue legal guardianship or HPOA.   Later, he  Disposition Plan: pending improvement in mentation, further monitoring for fever, psych consult.  Wife wants to take him home potentially, however, depending on his progression, may be interested in hospice care.    Consultants:  [facility]  Procedures:  CT lumbar spine  CT head  X-ray pelvis with left hip  CT left hip  Antibiotics:  Augmentin (Unasyn)  HPI/Subjective:  Patient is confused and agitated.  No further falls overnight.  Received numerous doses of ativan (7mg  overnight).  Lying naked in bed when I arrived and RN states they were unable to convince him to cover up.  He is not eating or drinking much of anything.  Trying to get out of bed repeatedly during my visit.    Objective: Filed Vitals:   04/12/15 0230 04/12/15 0245 04/12/15 0337 04/12/15 2045  BP: 152/99 169/91 173/90 153/96  Pulse: 100 89 84 108  Temp:   97.5 F (36.4 C) 100.1 F (37.8 C)  TempSrc:   Oral Oral  Resp: 16 17 18 18   Height:      Weight:      SpO2: 99% 100% 99% 94%    Intake/Output Summary (Last 24 hours) at 04/13/15 1058 Last data filed at 04/12/15 1825  Gross per 24 hour  Intake    120 ml  Output      0 ml  Net    120 ml   Filed Weights   04/11/15 2310  Weight: 72.576 kg (160 lb)   Body mass index is 22.33 kg/(m^2).  Exam:   General:  Adult  male, No acute distress, agitated, confused speech, trying to get out of bed, not answering questions reliably   HEENT:  NCAT, MMM  Cardiovascular:  RRR, nl S1, S2 no mrg, 2+ pulses, warm extremities  Respiratory:  CTAB, no increased WOB  Abdomen:   NABS, soft, NT/ND  MSK:   Decreased tone and bulk, no LEE, extensive bruising over the left flank and left upper leg  Neuro:  Grossly intact, ataxia without obvious tremor, no tongue fasciculations.  Unable to cooperate for asterixis exam yesterday  Data Reviewed: Basic Metabolic Panel:  Recent Labs Lab 04/10/15 0504 04/11/15 0630 04/11/15 2347 04/12/15 1636 04/13/15 0610  NA 138 140 135 138 134*  K 4.0 3.9 3.4* 3.5 4.3  CL 108 109 105 107 104  CO2 22 23 23 25  21*  GLUCOSE 108* 95 114* 108* 108*  BUN 11 12 15 18 16   CREATININE 1.12 0.97 0.94 0.81 0.67  CALCIUM 11.9* 11.5* 11.4* 11.7*  11.6*  11.0*  PHOS 1.2* 2.8  --  2.8  --    Liver Function Tests:  Recent Labs Lab 04/07/15 0359 04/08/15 0421 04/10/15 0504 04/11/15 0630 04/12/15 1636  AST 60* 54*  --   --  70*  ALT 59 50  --   --  68*  ALKPHOS 64 69  --   --  96  BILITOT 1.1 1.3*  --   --  1.5*  PROT 5.1* 5.6*  --   --  7.1  ALBUMIN 2.6* 2.7* 2.7* 2.9* 3.1*   No results for input(s): LIPASE, AMYLASE in the last 168 hours.  Recent Labs Lab 04/07/15 1210 04/12/15 1531  AMMONIA 28 64*   CBC:  Recent Labs Lab 04/09/15 1220 04/11/15 0630 04/11/15 2347 04/12/15 1636 04/13/15 0610 04/13/15 0846  WBC 8.8 8.2 7.7 10.8* PENDING 13.1*  NEUTROABS 7.1  --  6.0  --   --   --   HGB 8.2* 8.6* 9.4* 9.3* 10.1* 9.4*  HCT 25.5* 26.5* 28.5* 28.9* 29.1* 28.5*  MCV 104.5* 102.3* 102.9* 102.5* 99.3 101.1*  PLT 157 193 200 271 PENDING 304    Recent Results (from the past 240 hour(s))  Surgical pcr screen     Status: Abnormal   Collection Time: 04/05/15 11:19 PM  Result Value Ref Range Status   MRSA, PCR NEGATIVE NEGATIVE Final   Staphylococcus aureus POSITIVE (A) NEGATIVE Final    Comment:        The Xpert SA Assay (FDA approved for NASAL specimens in patients over 62 years of age), is one component of a comprehensive surveillance program.  Test performance has been validated by [facility] for patients greater than or equal to 11 year old. It is not intended to diagnose infection nor to guide or monitor treatment.   Culture, blood (routine x 2)     Status: None   Collection Time: 04/07/15  8:17 AM  Result Value Ref Range Status   Specimen Description BLOOD LEFT ARM  Final   Special Requests BOTTLES DRAWN AEROBIC AND ANAEROBIC 10CC  Final   Culture NO GROWTH 5 DAYS  Final   Report Status 04/12/2015 FINAL  Final  Culture, blood (routine x 2)     Status: None   Collection Time: 04/07/15  8:25 AM  Result Value Ref Range Status   Specimen Description BLOOD LEFT ARM  Final   Special Requests BOTTLES DRAWN  AEROBIC AND ANAEROBIC 10CC  Final   Culture NO GROWTH 5 DAYS  Final  Report Status 04/12/2015 FINAL  Final  Urine culture     Status: None   Collection Time: 04/07/15 12:10 PM  Result Value Ref Range Status   Specimen Description URINE, RANDOM  Final   Special Requests NONE  Final   Culture NO GROWTH 1 DAY  Final   Report Status 04/08/2015 FINAL  Final  Culture, blood (routine x 2)     Status: None (Preliminary result)   Collection Time: 04/13/15  8:46 AM  Result Value Ref Range Status   Specimen Description BLOOD LEFT ANTECUBITAL  Final   Special Requests BOTTLES DRAWN AEROBIC ONLY 5CC  Final   Culture PENDING  Incomplete   Report Status PENDING  Incomplete     Studies: Ct Head Wo Contrast  04/12/2015   CLINICAL DATA:  54 year old male with fall  EXAM: CT HEAD WITHOUT CONTRAST  TECHNIQUE: Contiguous axial images were obtained from the base of the skull through the vertex without intravenous contrast.  COMPARISON:  Head CT dated 03/12/2013  FINDINGS: There is slight prominence of the sulci compatible with age-related volume loss. Minimal periventricular and deep white matter hypodensities represent chronic microvascular ischemic changes. There is no intracranial hemorrhage. No mass effect or midline shift identified.  The visualized paranasal sinuses and mastoid air cells are well aerated. The calvarium is intact.  IMPRESSION: No acute intracranial pathology.   Electronically Signed   By: Elgie Collard M.D.   On: 04/12/2015 00:40   Ct Lumbar Spine Wo Contrast  04/12/2015   CLINICAL DATA:  54 year old male status post fall. History of back surgery last week.  EXAM: CT LUMBAR SPINE WITHOUT CONTRAST  TECHNIQUE: Multidetector CT imaging of the lumbar spine was performed without intravenous contrast administration. Multiplanar CT image reconstructions were also generated.  COMPARISON:  Radiograph dated 04/09/2015 and lumbar MRI dated 03/29/2015  FINDINGS: No acute fracture or subluxation of the  lumbar spine. There are postsurgical changes of L3-L4 laminectomy and posterior fixation hardware. Small pockets of air noted at the operative bed. No definite drainable fluid collection identified. Evaluation of this area is limited in the absence of contrast as well as due to streak artifact caused by metallic screws. There is evidence of prior L4-5 fusion.  Punctate nonobstructing right renal calculi. No hydronephrosis. Aortoiliac atherosclerotic disease.  IMPRESSION: Postoperative changes of lower lumbar spine. No acute fracture or subluxation.   Electronically Signed   By: Elgie Collard M.D.   On: 04/12/2015 00:47   Ct Hip Left Wo Contrast  04/12/2015   CLINICAL DATA:  54 year old male with left hip pain. Evaluate for fracture.  EXAM: CT OF THE LEFT HIP WITHOUT CONTRAST  TECHNIQUE: Multidetector CT imaging of the left hip was performed according to the standard protocol. Multiplanar CT image reconstructions were also generated.  COMPARISON:  Radiograph dated 04/12/2015  FINDINGS: There is a total left hip arthroplasty. There is comminuted fracture of the greater trochanter of the left femur. The femoral component of the arthroplasty remains in anatomic alignment with the acetabular cup without evidence of dislocation. No significant intramuscular hematoma identified. Evaluation however is limited due to streak artifact caused by italic arthroplasty.  Dense stool noted within the rectum compatible with fecal impaction. There scattered sigmoid diverticula without active inflammation. Aortoiliac atherosclerotic disease noted.  IMPRESSION: Nondisplaced comminuted fracture of the greater trochanter of the left femur. The femoral and acetabular components of the arthroplasty remain in anatomic alignment.   Electronically Signed   By: Elgie Collard M.D.   On: 04/12/2015  18:54   Dg Hip Unilat With Pelvis 2-3 Views Left  04/12/2015   CLINICAL DATA:  Larey Seat to the ground tonight. Unwitnessed fall. Left hip pain  and confusion. Soft tissue bruising.  EXAM: DG HIP (WITH OR WITHOUT PELVIS) 2-3V LEFT  COMPARISON:  None.  FINDINGS: Bilateral total hip arthroplasties using non cemented components. The left hip demonstrates a periprosthetic fracture involving the inter trochanteric region and greater trochanteric region. No significant displacement of fracture fragments. No dislocation of the hip arthroplasty.  Visualized portion of right hip arthroplasty appears intact. Heterotopic ossification over the greater trochanter. Pelvis appears intact. Postoperative changes in the lower lumbar spine.  IMPRESSION: Left total hip arthroplasty with acute appearing fracture of the inter trochanteric/greater trochanteric region of the left hip. No displacement or dislocation.   Electronically Signed   By: Burman Nieves M.D.   On: 04/12/2015 01:04    Scheduled Meds: . amLODipine  10 mg Oral Daily  . ampicillin-sulbactam (UNASYN) IV  3 g Intravenous Q6H  . aspirin EC  81 mg Oral Daily  . cloNIDine  0.1 mg Transdermal Q Wed  . enoxaparin (LOVENOX) injection  40 mg Subcutaneous Q24H  . feeding supplement (ENSURE ENLIVE)  237 mL Oral BID BM  . folic acid  1 mg Oral Daily  . lactulose  30 g Oral BID  . lithium carbonate  900 mg Oral QHS  . LORazepam  0-4 mg Intravenous Q6H   Followed by  . [START ON 04/14/2015] LORazepam  0-4 mg Intravenous Q12H  . multivitamin with minerals  1 tablet Oral Daily  . pantoprazole  40 mg Oral BID AC  . rifaximin  550 mg Oral BID  . thiamine  100 mg Oral Daily   Or  . thiamine  100 mg Intravenous Daily  . triamcinolone ointment  1 application Topical BID  . triamcinolone ointment  1 application Topical BID   Continuous Infusions: . dextrose 5 % and 0.45 % NaCl with KCl 10 mEq/L 100 mL/hr at 04/12/15 1814    Principal Problem:   Femur fracture, left Active Problems:   Bipolar disorder   Essential hypertension   Uncomplicated alcohol dependence   Malnutrition of moderate  degree    Time spent: 30 min    Lynnsie Linders  [facility] Pager [phone]. If 7PM-7AM, please contact night-coverage at [email or web address], password [facility] 04/13/2015, 10:58 AM  LOS: 1 day

## 2015-04-14 ENCOUNTER — Encounter (HOSPITAL_COMMUNITY): Payer: Self-pay

## 2015-04-14 DIAGNOSIS — Z515 Encounter for palliative care: Secondary | ICD-10-CM | POA: Insufficient documentation

## 2015-04-14 DIAGNOSIS — J690 Pneumonitis due to inhalation of food and vomit: Secondary | ICD-10-CM

## 2015-04-14 DIAGNOSIS — R410 Disorientation, unspecified: Secondary | ICD-10-CM

## 2015-04-14 LAB — BASIC METABOLIC PANEL
Anion gap: 7 (ref 5–15)
BUN: 7 mg/dL (ref 6–20)
CALCIUM: 10.7 mg/dL — AB (ref 8.9–10.3)
CO2: 26 mmol/L (ref 22–32)
CREATININE: 0.69 mg/dL (ref 0.61–1.24)
Chloride: 103 mmol/L (ref 101–111)
GFR calc Af Amer: >60 mL/min (ref >60)
GLUCOSE: 115 mg/dL — AB (ref 65–99)
Potassium: 3.6 mmol/L (ref 3.5–5.1)
SODIUM: 136 mmol/L (ref 135–145)

## 2015-04-14 LAB — CBC
HCT: 27.3 % — ABNORMAL LOW (ref 39.0–52.0)
Hemoglobin: 9.1 g/dL — ABNORMAL LOW (ref 13.0–17.0)
MCH: 33.7 pg (ref 26.0–34.0)
MCHC: 33.3 g/dL (ref 30.0–36.0)
MCV: 101.1 fL — ABNORMAL HIGH (ref 78.0–100.0)
PLATELETS: 226 10*3/uL (ref 150–400)
RBC: 2.7 MIL/uL — ABNORMAL LOW (ref 4.22–5.81)
RDW: 12.9 % (ref 11.5–15.5)
WBC: 9.9 10*3/uL (ref 4.0–10.5)

## 2015-04-14 LAB — GLUCOSE, CAPILLARY: GLUCOSE-CAPILLARY: 117 mg/dL — AB (ref 65–99)

## 2015-04-14 MED ORDER — QUETIAPINE FUMARATE 100 MG PO TABS
100.0000 mg | ORAL_TABLET | Freq: Once | ORAL | Status: DC
  Filled 2015-04-14 (×2): qty 1

## 2015-04-14 MED ORDER — QUETIAPINE FUMARATE 200 MG PO TABS
200.0000 mg | ORAL_TABLET | Freq: Every day | ORAL | Status: DC
  Administered 2015-04-14: 200 mg via ORAL
  Filled 2015-04-14 (×2): qty 1

## 2015-04-14 MED ORDER — VITAMIN B-1 100 MG PO TABS
100.0000 mg | ORAL_TABLET | Freq: Every day | ORAL | Status: DC
  Administered 2015-04-15 – 2015-04-16 (×2): 100 mg via ORAL
  Filled 2015-04-14 (×3): qty 1

## 2015-04-14 MED ORDER — MORPHINE SULFATE (PF) 2 MG/ML IV SOLN
1.0000 mg | INTRAVENOUS | Status: DC | PRN
  Administered 2015-04-14 – 2015-04-15 (×3): 1 mg via INTRAVENOUS
  Filled 2015-04-14 (×3): qty 1

## 2015-04-14 MED ORDER — TRAZODONE HCL 100 MG PO TABS
200.0000 mg | ORAL_TABLET | Freq: Every day | ORAL | Status: DC
  Administered 2015-04-15 – 2015-04-17 (×3): 200 mg via ORAL
  Filled 2015-04-14 (×4): qty 2

## 2015-04-14 MED ORDER — PANTOPRAZOLE SODIUM 40 MG PO TBEC
40.0000 mg | DELAYED_RELEASE_TABLET | Freq: Two times a day (BID) | ORAL | Status: DC
  Administered 2015-04-14 – 2015-04-16 (×5): 40 mg via ORAL
  Filled 2015-04-14 (×5): qty 1

## 2015-04-14 MED ORDER — METOPROLOL TARTRATE 1 MG/ML IV SOLN
5.0000 mg | Freq: Three times a day (TID) | INTRAVENOUS | Status: DC
  Administered 2015-04-14 – 2015-04-15 (×4): 5 mg via INTRAVENOUS
  Filled 2015-04-14 (×7): qty 5

## 2015-04-14 NOTE — Progress Notes (Signed)
[facility] PROGRESS NOTE  [name] AOZ:[id number] DOB: February 20, 1961 DOA: 04/11/2015 PCP: Arlan Organ., MD  Brief Summary  The patient is a 54 yo M with history of alcohol abuse, supposedly in remission since 11/2002 but later reported drinking 4 beers daily and vodka intermittently, peripheral neuropathy, bipolar disorder on lithium, hepatitis C with liver cirrhosis, GERD, chronic pain, hypertension, possible TIA.  He was he was hospitalized from 04/04/2015 through 04/11/2015 secondary to acute on chronic low back pain with sciatica. His MRI demonstrated severe degenerative joint disease from L3-L4 with severe spinal stenosis and inferior endplate Schmorl's node at T12.  He was seen by neurosurgery and underwent L4-L5 decompressive lumbar laminectomy, bilateral L4-L5 facetectomy, foraminotomies with decompression beyond that required for interbody arthrodesis.  He had L4-5 posterior l umbar interbody arthrodesis with AVS peek interbody implants, Vitoss BA with bone marrow aspirate and infusion by Drs Newell Coral and Venetia Maxon on 9/1.  His pain improved, however, his postoperative course was complicated by delirium and agitation attributed to alcohol withdrawal.  He had pancytopenia and macrocytosis suggestive of chronic alcohol abuse.  Lithium level during previous admission was less than threshold suggesting possible noncompliance with therapy or subtherapeutic dose.  His dose was not adjusted during that admission.  He also had a low grade fever that was attributed to possible aspiration pneumonia.  At the time of discharge, his fever had resolved, he was able to carry on a conversation with the discharging physician and had been recommended for SNF for rehab.  He was discharged to [facility] where he had a fall within a few hours of arrival and he was transported back to the ER for reevaluation.  He has history of bilateral total hip replacements.  His CT lumbar spine demonstrated no evidence of lower  spine fracture or subluxation, head CT was negative, but XR hips demonstrated a left total hip arthroplasty with acute appearing fracture of the intertrochanteric/greater trochanteric region of the left hip without displacement or dislocation.  He was admitted for further evaluation by orthopedic surgery.    Assessment/Plan  Left hip intertrochanteric/greater trochanter fracture with recurrent falls both in and out of hospital -  CT hip:  Nondisplaced comminuted fracture of the greater trochanter of the left femur with intact arthroplasty components -  Falls precautions and sitter at bedside -  Appreciate orthopedics assistance, no plan for imminent surgery  Delirium.  DDx includes ongoing symptoms of delirium secondary to severe illness and prolonged hospitalization, clonidine cessation, hepatic encephalopathy.  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  He has previously had elevated lithium levels and may have lingering tremor and confusion from earlier toxicity. -  Continue to encourage rifaximin and lactulose -  HIV NR, RPR NR, vitamin B12 wnl, TSH slightly abnormal 1 week ago and will need to be repeated in a few weeks -  Lithium level less than threshold -  Discontinue CIWA protocol -  Continue clonidine patch -  Continue zyprexa -  Add Seroquel 200 mg nightly -  Resume trazodone 200 mg daily at bedtime -  Padded care is recommended increasing narcotics since this may represent necrotic withdrawal -  Awaiting psychiatry recommendations  Likely dehydration from not eating or drinking in the last 12 hours -  Continue IVF  Hypercalcemia likely due to primary hyperparathyroidism and recent bone surgery and now hip fracture, has been going on for years -  Phos wnl, PTH elevated and  vitamin D levels low normal -  Defer to PCP for referral for surgery if he recovers from this illness  Alcohol abuse/hepatitis  C/cirrhosis - Patient continues to drink up to 4 beers daily and vodka occasionally. Outside of window for withdrawal - Discontinue CIWA protocol - Last ultrasound was January 2016 which demonstrated increased echogenicity and enlargement  Macrocytic anemia -  Likely related to ongoing alcohol abuse related bone marrow toxicity and cirrhosis. -  Iron studies c/w possible iron deficiency vs. Chronic disease -  Start once daily iron supplementation -  Repeat TSH and free T4 which were abnormal a few days ago in 2-3 weeks (likely abnormal due to recent surgery) -  Vitamin b12 and folate were wnl on 04/07/2015  Essential hypertension with very elevated blood pressures -  refusing norvasc -  Increased clonidine patch -  Continue prn hydralazine -  Start metoprolol scheduled  Bipolar disorder - resumed lithium -  Psychiatry consultation  Psoriasis - Patient has extensive skin psoriatic lesions on his back, limbs. Outpatient follow-up.  Aspiration pneumonia with low grade fever and patient refusing oral antibiotics.  UA negative. -  Continue IV antibiotics day 2 -  Blood cultures no growth to date  RUE swelling -  Duplex extremity pending  Diet:  Low sodium  Access:  PIV IVF:  yes Proph:  lovenox  Code Status:  DNR  Per conversation with wife.  Patient had previously asked for DNR and I agreed that given his frailty that that would be a good choice.   Family Communication: patient and brother who was at bedside.  Brother requested that he be involved in decision making, however, the patient's wife is the [facility] and patient does not have capacity at this time to give HPOA to anyone else.  Advised brother to hire a lawyer if he wants to pursue legal guardianship or HPOA.   Later, he  Disposition Plan: pending improvement in mentation, further monitoring for fever, psych consult.  Wife wants to take him home potentially, however, depending on his progression, may be interested in hospice  care.    Consultants:  [facility]  Procedures:  CT lumbar spine  CT head  X-ray pelvis with left hip  CT left hip  Antibiotics:  Augmentin (Unasyn) 9/8  HPI/Subjective:  Patient is confused and agitated.  He ate a little bit of breakfast today.  He has been having small soft stools without diarrhea.  Objective: Filed Vitals:   04/12/15 2045 04/13/15 1300 04/13/15 1930 04/14/15 0525  BP:  176/84 162/81 180/94  Pulse:  104 112 118  Temp:  98.5 F (36.9 C) 97.4 F (36.3 C) 99.1 F (37.3 C)  TempSrc: Oral  Oral Oral  Resp:  Height:      Weight:      SpO2:  96% 98% 99%    Intake/Output Summary (Last 24 hours) at 04/14/15 1429 Last data filed at 04/14/15 0900  Gross per 24 hour  Intake    240 ml  Output      0 ml  Net    240 ml   Filed Weights   04/11/15 2310  Weight: 72.576 kg (160 lb)   Body mass index is 22.33 kg/(m^2).  Exam:   General:  Adult male, No acute distress, agitated, confused speech, repeatedly pulling off his gown   HEENT:  NCAT, MMM  Cardiovascular:  RRR, nl S1, S2 no mrg, 2+ pulses, warm extremities  Respiratory:  CTAB, no  increased WOB  Abdomen:   NABS, soft, NT/ND  MSK:   Decreased tone and bulk, no LEE, extensive bruising over the left flank and left upper leg  Neuro:  Grossly intact, ataxia without obvious tremor, no tongue fasciculations.  Unable to cooperate for asterixis exam yesterday  Data Reviewed: Basic Metabolic Panel:  Recent Labs Lab 04/10/15 0504 04/11/15 0630 04/11/15 2347 04/12/15 1636 04/13/15 0610 04/14/15 0526  NA 138 140 135 138 134* 136  K 4.0 3.9 3.4* 3.5 4.3 3.6  CL 108 109 105 107 104 103  CO2 22 23 23 25  21* 26  GLUCOSE 108* 95 114* 108* 108* 115*  BUN 11 12 15 18 16 7   CREATININE 1.12 0.97 0.94 0.81 0.67 0.69  CALCIUM 11.9* 11.5* 11.4* 11.7*  11.6* 11.0* 10.7*  PHOS 1.2* 2.8  --  2.8  --   --    Liver Function Tests:  Recent Labs Lab 04/08/15 0421  04/10/15 0504 04/11/15 0630 04/12/15 1636  AST 54*  --   --  70*  ALT 50  --   --  68*  ALKPHOS 69  --   --  96  BILITOT 1.3*  --   --  1.5*  PROT 5.6*  --   --  7.1  ALBUMIN 2.7* 2.7* 2.9* 3.1*   No results for input(s): LIPASE, AMYLASE in the last 168 hours.  Recent Labs Lab 04/12/15 1531  AMMONIA 64*   CBC:  Recent Labs Lab 04/09/15 1220  04/11/15 2347 04/12/15 1636 04/13/15 0610 04/13/15 0846 04/14/15 0526  WBC 8.8  < > 7.7 10.8* 17.4* 13.1* 9.9  NEUTROABS 7.1  --  6.0  --   --   --   --   HGB 8.2*  < > 9.4* 9.3* 10.1* 9.4* 9.1*  HCT 25.5*  < > 28.5* 28.9* 29.1* 28.5* 27.3*  MCV 104.5*  < > 102.9* 102.5* 99.3 101.1* 101.1*  PLT 157  < > 200 271 PLATELET CLUMPS NOTED ON SMEAR, UNABLE TO ESTIMATE 304 226  < > = values in this interval not displayed.  Recent Results (from the past 240 hour(s))  Surgical pcr screen     Status: Abnormal   Collection Time: 04/05/15 11:19 PM  Result Value Ref Range Status   MRSA, PCR NEGATIVE NEGATIVE Final   Staphylococcus aureus POSITIVE (A) NEGATIVE Final    Comment:        The Xpert SA Assay (FDA approved for NASAL specimens in patients over 54 years of age), is one component of a comprehensive surveillance program.  Test performance has been validated by [facility] for patients greater than or equal to 69 year old. It is not intended to diagnose infection nor to guide or monitor treatment.   Culture, blood (routine x 2)     Status: None   Collection Time: 04/07/15  8:17 AM  Result Value Ref Range Status   Specimen Description BLOOD LEFT ARM  Final   Special Requests BOTTLES DRAWN AEROBIC AND ANAEROBIC 10CC  Final   Culture NO GROWTH 5 DAYS  Final   Report Status 04/12/2015 FINAL  Final  Culture, blood (routine x 2)     Status: None   Collection Time: 04/07/15  8:25 AM  Result Value Ref Range Status   Specimen Description BLOOD LEFT ARM  Final   Special Requests BOTTLES DRAWN AEROBIC AND ANAEROBIC 10CC  Final    Culture NO GROWTH 5 DAYS  Final   Report Status 04/12/2015 FINAL  Final  Urine culture     Status: None   Collection Time: 04/07/15 12:10 PM  Result Value Ref Range Status   Specimen Description URINE, RANDOM  Final   Special Requests NONE  Final   Culture NO GROWTH 1 DAY  Final   Report Status 04/08/2015 FINAL  Final  Culture, blood (routine x 2)     Status: None (Preliminary result)   Collection Time: 04/13/15  8:46 AM  Result Value Ref Range Status   Specimen Description BLOOD LEFT ANTECUBITAL  Final   Special Requests BOTTLES DRAWN AEROBIC ONLY 5CC  Final   Culture NO GROWTH 1 DAY  Final   Report Status PENDING  Incomplete  Culture, blood (routine x 2)     Status: None (Preliminary result)   Collection Time: 04/13/15  8:55 AM  Result Value Ref Range Status   Specimen Description BLOOD RIGHT ANTECUBITAL  Final   Special Requests BOTTLES DRAWN AEROBIC ONLY 5CC  Final   Culture NO GROWTH 1 DAY  Final   Report Status PENDING  Incomplete     Studies: Ct Hip Left Wo Contrast  04/12/2015   CLINICAL DATA:  54 year old male with left hip pain. Evaluate for fracture.  EXAM: CT OF THE LEFT HIP WITHOUT CONTRAST  TECHNIQUE: Multidetector CT imaging of the left hip was performed according to the standard protocol. Multiplanar CT image reconstructions were also generated.  COMPARISON:  Radiograph dated 04/12/2015  FINDINGS: There is a total left hip arthroplasty. There is comminuted fracture of the greater trochanter of the left femur. The femoral component of the arthroplasty remains in anatomic alignment with the acetabular cup without evidence of dislocation. No significant intramuscular hematoma identified. Evaluation however is limited due to streak artifact caused by italic arthroplasty.  Dense stool noted within the rectum compatible with fecal impaction. There scattered sigmoid diverticula without active inflammation. Aortoiliac atherosclerotic disease noted.  IMPRESSION: Nondisplaced  comminuted fracture of the greater trochanter of the left femur. The femoral and acetabular components of the arthroplasty remain in anatomic alignment.   Electronically Signed   By: Elgie Collard M.D.   On: 04/12/2015 18:54    Scheduled Meds: . ampicillin-sulbactam (UNASYN) IV  3 g Intravenous Q6H  . [START ON 04/19/2015] cloNIDine  0.2 mg Transdermal Q Wed  . enoxaparin (LOVENOX) injection  40 mg Subcutaneous Q24H  . feeding supplement (ENSURE ENLIVE)  237 mL Oral BID BM  . lactulose  30 g Oral BID  . lactulose  300 mL Rectal BID  . lithium carbonate  900 mg Oral QHS  . OLANZapine zydis  10 mg Oral QHS  . pantoprazole  40 mg Oral BID  . QUEtiapine  100 mg Oral Once  . QUEtiapine  200 mg Oral QHS  . rifaximin  550 mg Oral BID  . thiamine  100 mg Oral Daily  . traZODone  200 mg Oral QHS  . triamcinolone ointment  1 application Topical BID  . triamcinolone ointment  1 application Topical BID   Continuous Infusions: . dextrose 5 % and 0.45 % NaCl with KCl 10 mEq/L 50 mL/hr (04/14/15 0905)    Principal Problem:   Bipolar disorder Active Problems:   Essential hypertension   Uncomplicated alcohol dependence   Femur fracture, left   Malnutrition of moderate degree   Acute delirium    Time spent: 30 min    Latrina Guttman, [facility]  [facility] Pager [phone]. If 7PM-7AM, please contact night-coverage at [email or web address], password [facility] 04/14/2015, 2:29 PM  LOS: 2 days

## 2015-04-14 NOTE — Consult Note (Signed)
Consultation Note Date: 04/14/2015   Patient Name: [name]  DOB: 1960-10-15  MRN: [id number]  Age / Sex: 54 y.o., male   PCP: Molli Barrows, MD Referring Physician: Janece Canterbury, MD  Reason for Consultation: Establishing goals of care  Palliative Care Assessment and Plan Summary of Established Goals of Care and Medical Treatment Preferences   Clinical Assessment/Narrative: Patient is a 55 year old male who was discharged discharge from [facility] on 04/11/2015 after undergoing a lumbar fusion. He was discharged to a skilled nursing facility for rehabilitation, but a few hours after his admission he sustained a fall and was brought into the emergency room and was found to have a broken hip. His previous hospitalization was complicated by delirium which was attributed to alcohol withdrawal at that time. Since his readmission on 04/12/2015 he has been delirious, requiring a sitter for one to one safety concerns. . He has been on lactulose and his medicines have been streamlined in an effort to see if his mentation can clear. At this point patient is not deemed a surgical candidate. Patient has an underlying previous medical history of bipolar disorder as well as polysubstance abuse. In meeting with his brother Legrand Como, as well as his wife Charleston Ropes, both report similar patterns of consuming not only alcohol but overusing benzodiazepines as well as opioids. His h/o addiction has complicated stabilization of his bipolar disorder, and he has been on multiple mood stabilizers including lithium. His lithium level was subtherapeutic upon admission. Despite receiving lactulose he has remained confused, encephalopathic. His ammonia level is 64. Patient is unable to follow commands. He is too weak to stand, nor can he follow instructions to participate in physical therapy. He is eating only bites and sips. He is also spitting out PO meds.Patient's physician has also discussed with family the concern that his  mentation is not clearing at this point and he may actually be trending towards end-of-life, and be hospice appropriate. Patient's parents are both alive and are planning on coming down from [place] tomorrow. I met with patient's brother, Legrand Como, who is very tearful but states he's beginning to grasp that his brother may not recover from this as he had hoped. I also met with his wife Charleston Ropes, who  was surprised at the severity of his illness. She told me she thought it was "like all the other times". A lot of our meeting today was focused on disease progression related to hepatitis C and alcoholism and end-stage liver disease.  Contacts/Participants in Discussion: Primary Decision Maker: Wife at this point.   HCPOA: yes  Met with patient's wife Charleston Ropes, her sister. Spoke with patient's brother Legrand Como separately as well as his parents over the phone Family meeting scheduled for 2:00 on 04/16/2015  Code Status/Advance Care Planning:  DNR  Symptom Management:   Pain: Patient has chronic back pain and is recently fallen again and sustained a left hip fracture. Per spouse he is taking an average of oxycodone 40 mg daily prior to his admission on 04/05/2015. We'll increase his morphine allowance to 1 mg every 2 hours as needed for pain. We'll monitor for need for scheduled dosing.  Agitation: Question of unmanaged pain is fueling some of his agitation. We'll increase his  order of morphine but may need scheduled dosing in order to resolve this. Do not think this is withdrawal at this point. Questionable Korsakoff's syndrome and ongoing hepatic encephalopathy  related to end-stage liver disease. Benzodiazepines are being held at this point to see if  mentation can improve. Continue lactulose if patient is able to take   Bipolar disorder: Patient has been seen by psychiatry that note is pending. Cntinue current recommendations of lithium 900 mg daily but monitor for lithium toxicity as he is only eating  bites and sips. Continue Zyprexa 10 mg at bedtime. Nurse reports seing more benefit from Zyprexa than Seroquel. Continue Seroquel 200 mg at bedtime as well as trazodone 200 mg at bedtime. Consider maximizing Zyprexa and DC of Seroquel  Additional Recommendations (Limitations, Scope, Preferences):  Hospice if mentation does not clear Psycho-social/Spiritual:   Support System: yes  Desire for further Chaplaincy support:no  Prognosis: < 6 months Pt at high risk for an acte event or for a more limited prognosis in setting of on-going encephalopathy  Discharge Planning:  Undetermined at this point. Pt meets hospice in-home criteria most likely but questionable if he meets hospice in-patient criteria at this stage. Pt unable to be cared for in home setting at this point       Chief Complaint/History of Present Illness: Patient is a 54 year old man who was discharged  from [facility] on 9/6 after being treated for back pain and undergoing a lumbar fusion. That admission was complicated by delirium, altered mental status, and fever . He was discharged on 09/06 to  SNF for rehabilitation but was brought back in several hours after admission there after he fell and sustained a left hip fracture. He has been delirious and agitated since his readmission and has an elevated ammonia level at 64  Primary Diagnoses  Present on Admission:  . Femur fracture, left . Bipolar disorder . Essential hypertension . Uncomplicated alcohol dependence  Palliative Review of Systems: Confused I have reviewed the medical record, interviewed the patient and family, and examined the patient. The following aspects are pertinent.  Past Medical History  Diagnosis Date  . Tachycardia   . Peripheral neuropathy   . Alcohol abuse     hx of quit 4/04  . Bilateral external ear infections 2007  . Tobacco abuse   . Bipolar affective disorder     Followed by Dr. Wallace Going  . Avascular necrosis     SP right and left hip  replacements and L shoulder arthroplasty, secondary to steroid use.  Has been on embrel in past.  . Psoriasis     Previously on embrel, stopped because of price.   . Hypertension   . Hepatitis C   . ARF (acute renal failure) 2008    Multifactorial in setting of over use of ibuprofen and HCTZ./ACEI  . Cirrhosis   . Back pain   . Pneumonia     hx  . Heart murmur   . Stroke 11    ? "mini stroke"  . GERD (gastroesophageal reflux disease)     occ  . Arthritis   . Pharyngeal edema 03/12/2013  . Alcohol abuse   . Benzodiazepine abuse   . Carotid atherosclerosis   . Pectus excavatum    Social History   Social History  . Marital Status: Married    Spouse Name: N/A  . Number of Children: N/A  . Years of Education: N/A   Social History Main Topics  . Smoking status: Current Every Day Smoker -- 1.00 packs/day for 35 years    Types: Cigarettes  . Smokeless tobacco: Never Used     Comment: Not interested in stopping.      i want to quit togeather with my wife "  . Alcohol Use:  No     Comment: Previously alcoholic, denies ETOH for "past few weeks"  . Drug Use: No  . Sexual Activity: No   Other Topics Concern  . None   Social History Narrative   Patient has problems with his girlfriend who he refers to as an alcoholic.    Family History  Problem Relation Age of Onset  . Hypertension Mother   . Bipolar disorder Father   . Obesity Father   . Obesity Brother    Scheduled Meds: . ampicillin-sulbactam (UNASYN) IV  3 g Intravenous Q6H  . [START ON 04/19/2015] cloNIDine  0.2 mg Transdermal Q Wed  . enoxaparin (LOVENOX) injection  40 mg Subcutaneous Q24H  . feeding supplement (ENSURE ENLIVE)  237 mL Oral BID BM  . lactulose  30 g Oral BID  . lactulose  300 mL Rectal BID  . lithium carbonate  900 mg Oral QHS  . OLANZapine zydis  10 mg Oral QHS  . pantoprazole  40 mg Oral BID  . QUEtiapine  100 mg Oral Once  . QUEtiapine  200 mg Oral QHS  . rifaximin  550 mg Oral BID  .  thiamine  100 mg Oral Daily  . traZODone  200 mg Oral QHS  . triamcinolone ointment  1 application Topical BID  . triamcinolone ointment  1 application Topical BID   Continuous Infusions: . dextrose 5 % and 0.45 % NaCl with KCl 10 mEq/L 50 mL/hr (04/14/15 0905)   PRN Meds:.clobetasol ointment, hydrALAZINE, morphine injection Medications Prior to Admission:  Prior to Admission medications   Medication Sig Start Date End Date Taking? Authorizing Provider  acetaminophen (TYLENOL) 325 MG tablet Take 2 tablets (650 mg total) by mouth every 4 (four) hours as needed. Patient taking differently: Take 650 mg by mouth every 4 (four) hours as needed.  03/16/13  Yes Marianne L York, PA-C  amLODipine (NORVASC) 5 MG tablet Take 2 tablets (10 mg total) by mouth daily. 03/16/13  Yes Bobby Rumpf York, PA-C  aspirin 81 MG tablet Take 81 mg by mouth daily.   Yes Historical Provider, MD  augmented betamethasone dipropionate (DIPROLENE-AF) 0.05 % ointment Apply 1 application topically 2 (two) times daily. 03/06/15  Yes Historical Provider, MD  clobetasol ointment (TEMOVATE) 6.43 % Apply 1 application topically daily as needed. Apply affected areas for psoriasis   Yes Historical Provider, MD  fluticasone (CUTIVATE) 0.005 % ointment Apply 1 application topically 2 (two) times daily. 03/06/15  Yes Historical Provider, MD  lithium carbonate 300 MG capsule Take 900 mg by mouth at bedtime.    Yes Historical Provider, MD  omeprazole (PRILOSEC) 20 MG capsule Take 1 capsule (20 mg total) by mouth 2 (two) times daily. 04/11/14  Yes Tanna Furry, MD  oxyCODONE (OXY IR/ROXICODONE) 5 MG immediate release tablet Take 1-2 tablets (5-10 mg total) by mouth every 4 (four) hours as needed for moderate pain or breakthrough pain. 04/11/15  Yes Verlee Monte, MD  oxyCODONE-acetaminophen (PERCOCET/ROXICET) 5-325 MG per tablet Take 1 tablet by mouth every 6 (six) hours as needed for moderate pain.  03/18/15  Yes Historical Provider, MD  traZODone  (DESYREL) 100 MG tablet Take 200 mg by mouth at bedtime.   Yes Historical Provider, MD  amoxicillin-clavulanate (AUGMENTIN) 875-125 MG per tablet Take 1 tablet by mouth 2 (two) times daily. Patient not taking: Reported on 04/12/2015 04/11/15   Verlee Monte, MD  cloNIDine (CATAPRES) 0.3 MG tablet Take 0.3 mg by mouth 2 (two) times daily. 03/16/15  Historical Provider, MD  OLANZapine (ZYPREXA) 10 MG tablet Take 10 mg by mouth at bedtime.    Historical Provider, MD  sertraline (ZOLOFT) 100 MG tablet Take 100 mg by mouth daily.    Historical Provider, MD   Allergies  Allergen Reactions  . Prochlorperazine Maleate Other (See Comments)    REACTION: jaw lock  . Erythromycin Other (See Comments)    REACTION: GI upset  . Compazine [Prochlorperazine Edisylate]     Jaw locks  . Hydromorphone Hcl Itching    REACTION: itching   CBC:    Component Value Date/Time   WBC 9.9 04/14/2015 0526   HGB 9.1* 04/14/2015 0526   HCT 27.3* 04/14/2015 0526   PLT 226 04/14/2015 0526   MCV 101.1* 04/14/2015 0526   NEUTROABS 6.0 04/11/2015 2347   LYMPHSABS 1.1 04/11/2015 2347   MONOABS 0.5 04/11/2015 2347   EOSABS 0.1 04/11/2015 2347   BASOSABS 0.0 04/11/2015 2347   Comprehensive Metabolic Panel:    Component Value Date/Time   NA 136 04/14/2015 0526   K 3.6 04/14/2015 0526   CL 103 04/14/2015 0526   CO2 26 04/14/2015 0526   BUN 7 04/14/2015 0526   CREATININE 0.69 04/14/2015 0526   GLUCOSE 115* 04/14/2015 0526   CALCIUM 10.7* 04/14/2015 0526   CALCIUM 11.6* 04/12/2015 1636   AST 70* 04/12/2015 1636   ALT 68* 04/12/2015 1636   ALKPHOS 96 04/12/2015 1636   BILITOT 1.5* 04/12/2015 1636   PROT 7.1 04/12/2015 1636   ALBUMIN 3.1* 04/12/2015 1636    Physical Exam: Vital Signs: BP 180/94 mmHg  Pulse 118  Temp(Src) 99.1 F (37.3 C) (Oral)  Resp 18  Ht $R'5\' 11"'Sh$  (1.803 m)  Wt 72.576 kg (160 lb)  BMI 22.33 kg/m2  SpO2 99% SpO2: SpO2: 99 % O2 Device: O2 Device: Not Delivered O2 Flow Rate:     Intake/output summary:  Intake/Output Summary (Last 24 hours) at 04/14/15 1423 Last data filed at 04/14/15 0900  Gross per 24 hour  Intake    240 ml  Output      0 ml  Net    240 ml   LBM: Last BM Date: 04/14/15 Baseline Weight: Weight: 72.576 kg (160 lb) Most recent weight: Weight: 72.576 kg (160 lb)  Exam Findings:  General: Cachectic middle-aged man. Restless confused attempting to calm out of the bed Respiratory no worker breathing observed Neurological: Psychomotor restlessness, repetitive speech. Psych: Thought processes disorganized, confused         Palliative Performance Scale: 30-40%              Additional Data Reviewed: Recent Labs     04/13/15  0610  04/13/15  0846  04/14/15  0526  WBC  17.4*  13.1*  9.9  HGB  10.1*  9.4*  9.1*  PLT  PLATELET CLUMPS NOTED ON SMEAR, UNABLE TO ESTIMATE  304  226  NA  134*   --   136  BUN  16   --   7  CREATININE  0.67   --   0.69     Time In: 1300 Time Out: 1415 Time Total: 1minutes Greater than 50%  of this time was spent counseling and coordinating care related to the above assessment and plan. Staffed with Dr. Sheran Fava  Signed by: Dory Horn, NP  Dory Horn, NP  04/14/2015, 2:23 PM  Please contact Palliative Medicine Team phone at [phone] for questions and concerns.

## 2015-04-15 ENCOUNTER — Inpatient Hospital Stay (HOSPITAL_COMMUNITY): Payer: Medicare Other

## 2015-04-15 DIAGNOSIS — J690 Pneumonitis due to inhalation of food and vomit: Secondary | ICD-10-CM

## 2015-04-15 DIAGNOSIS — F3110 Bipolar disorder, current episode manic without psychotic features, unspecified: Secondary | ICD-10-CM

## 2015-04-15 LAB — LIPID PANEL
CHOLESTEROL: 88 mg/dL (ref 0–200)
HDL: 17 mg/dL — AB (ref >40)
LDL Cholesterol: 61 mg/dL (ref 0–99)
TRIGLYCERIDES: 50 mg/dL (ref <150)
Total CHOL/HDL Ratio: 5.2 RATIO
VLDL: 10 mg/dL (ref 0–40)

## 2015-04-15 LAB — BASIC METABOLIC PANEL
ANION GAP: 7 (ref 5–15)
BUN: 6 mg/dL (ref 6–20)
CO2: 26 mmol/L (ref 22–32)
Calcium: 10.4 mg/dL — ABNORMAL HIGH (ref 8.9–10.3)
Chloride: 106 mmol/L (ref 101–111)
Creatinine, Ser: 0.67 mg/dL (ref 0.61–1.24)
GLUCOSE: 115 mg/dL — AB (ref 65–99)
POTASSIUM: 3 mmol/L — AB (ref 3.5–5.1)
Sodium: 139 mmol/L (ref 135–145)

## 2015-04-15 LAB — CBC
HEMATOCRIT: 25.7 % — AB (ref 39.0–52.0)
HEMOGLOBIN: 8.6 g/dL — AB (ref 13.0–17.0)
MCH: 33.9 pg (ref 26.0–34.0)
MCHC: 33.5 g/dL (ref 30.0–36.0)
MCV: 101.2 fL — ABNORMAL HIGH (ref 78.0–100.0)
Platelets: 170 10*3/uL (ref 150–400)
RBC: 2.54 MIL/uL — AB (ref 4.22–5.81)
RDW: 13.1 % (ref 11.5–15.5)
WBC: 7.4 10*3/uL (ref 4.0–10.5)

## 2015-04-15 LAB — GLUCOSE, CAPILLARY
GLUCOSE-CAPILLARY: 146 mg/dL — AB (ref 65–99)
Glucose-Capillary: 103 mg/dL — ABNORMAL HIGH (ref 65–99)

## 2015-04-15 LAB — LITHIUM LEVEL: LITHIUM LVL: 0.58 mmol/L — AB (ref 0.60–1.20)

## 2015-04-15 LAB — T4, FREE: FREE T4: 1.4 ng/dL — AB (ref 0.61–1.12)

## 2015-04-15 LAB — TSH: TSH: 0.498 u[IU]/mL (ref 0.350–4.500)

## 2015-04-15 MED ORDER — METOPROLOL TARTRATE 1 MG/ML IV SOLN
5.0000 mg | Freq: Four times a day (QID) | INTRAVENOUS | Status: DC | PRN
  Filled 2015-04-15: qty 5

## 2015-04-15 MED ORDER — OLANZAPINE 5 MG PO TBDP
5.0000 mg | ORAL_TABLET | Freq: Every day | ORAL | Status: DC
  Administered 2015-04-15 – 2015-04-18 (×4): 5 mg via ORAL
  Filled 2015-04-15 (×4): qty 1

## 2015-04-15 MED ORDER — POTASSIUM CHLORIDE CRYS ER 20 MEQ PO TBCR
40.0000 meq | EXTENDED_RELEASE_TABLET | Freq: Once | ORAL | Status: AC
  Administered 2015-04-15: 20 meq via ORAL
  Filled 2015-04-15: qty 2

## 2015-04-15 MED ORDER — OLANZAPINE 5 MG PO TBDP
15.0000 mg | ORAL_TABLET | Freq: Every day | ORAL | Status: DC
  Administered 2015-04-15 – 2015-04-17 (×3): 15 mg via ORAL
  Filled 2015-04-15 (×4): qty 1

## 2015-04-15 MED ORDER — OXYCODONE HCL 5 MG PO TABS
5.0000 mg | ORAL_TABLET | Freq: Four times a day (QID) | ORAL | Status: DC
  Administered 2015-04-15 – 2015-04-16 (×5): 5 mg via ORAL
  Filled 2015-04-15 (×5): qty 1

## 2015-04-15 MED ORDER — ZINC OXIDE 40 % EX OINT
TOPICAL_OINTMENT | CUTANEOUS | Status: DC | PRN
  Filled 2015-04-15: qty 114

## 2015-04-15 MED ORDER — METOPROLOL TARTRATE 25 MG PO TABS
25.0000 mg | ORAL_TABLET | Freq: Two times a day (BID) | ORAL | Status: DC
  Administered 2015-04-16: 25 mg via ORAL
  Filled 2015-04-15 (×3): qty 1

## 2015-04-15 MED ORDER — FERROUS SULFATE 325 (65 FE) MG PO TABS
325.0000 mg | ORAL_TABLET | Freq: Every day | ORAL | Status: DC
  Administered 2015-04-16: 325 mg via ORAL
  Filled 2015-04-15: qty 1

## 2015-04-15 MED ORDER — PERMETHRIN 5 % EX CREA
TOPICAL_CREAM | Freq: Once | CUTANEOUS | Status: DC
  Filled 2015-04-15: qty 60

## 2015-04-15 MED ORDER — PERMETHRIN 0.25 % LIQD
Freq: Once | Status: DC
  Filled 2015-04-15: qty 147.86

## 2015-04-15 MED ORDER — LACTULOSE 10 GM/15ML PO SOLN
20.0000 g | Freq: Two times a day (BID) | ORAL | Status: DC
  Administered 2015-04-16 – 2015-04-18 (×3): 20 g via ORAL
  Filled 2015-04-15 (×5): qty 30

## 2015-04-15 NOTE — Progress Notes (Signed)
Daily Progress Note   Patient Name: [name]       Date: 04/15/2015 DOB: 1961-03-16  Age: 54 y.o. MRN#: [id number] Attending Physician: Renae Fickle, MD Primary Care Physician: Arlan Organ., MD Admit Date: 04/11/2015  Reason for Consultation/Follow-up: Establishing goals of care, Non pain symptom management, Pain control and Psychosocial/spiritual support  Subjective: Patient appears slightly more alert this morning. His speech is still confused but it is clearer. He makes better eye contact. Patient ate about 50% of his breakfast this morning. Per CNA, he did make an attempt to hold his cup. He is able to lie still in the bed this morning while getting a bath. He is not repeatedly trying to climb out of bed. He did receive 2 doses of morphine 1 at 6 PM last night and at 2:00 this morning. Per nursing he was able to rest and was much calmer after receiving pain medicine. He tells me that he hurts this morning. States his pain is in his back and hips. He has begun to spike some fevers despite being on antibiotics. He was also noted to have head lice. Interval Events: None Length of Stay: 3 days  Current Medications: Scheduled Meds:  . ampicillin-sulbactam (UNASYN) IV  3 g Intravenous Q6H  . [START ON 04/19/2015] cloNIDine  0.2 mg Transdermal Q Wed  . enoxaparin (LOVENOX) injection  40 mg Subcutaneous Q24H  . feeding supplement (ENSURE ENLIVE)  237 mL Oral BID BM  . lactulose  30 g Oral BID  . lactulose  300 mL Rectal BID  . lithium carbonate  900 mg Oral QHS  . metoprolol  5 mg Intravenous 3 times per day  . OLANZapine zydis  15 mg Oral QHS  . OLANZapine zydis  5 mg Oral Daily  . oxyCODONE  5 mg Oral 4 times per day  . pantoprazole  40 mg Oral BID  . permethrin   Topical Once  . rifaximin  550 mg Oral BID  . thiamine  100 mg Oral Daily  . traZODone  200 mg Oral QHS  . triamcinolone ointment  1 application Topical BID  . triamcinolone ointment  1 application Topical BID     Continuous Infusions: . dextrose 5 % and 0.45 % NaCl with KCl 10 mEq/L 50 mL/hr (04/14/15 0905)    PRN Meds: clobetasol ointment, hydrALAZINE, morphine injection  Palliative Performance Scale: 30%      Vital Signs: BP 160/85 mmHg  Pulse 116  Temp(Src) 98.2 F (36.8 C) (Axillary)  Resp 18  Ht 5\' 11"  (1.803 m)  Wt 72.576 kg (160 lb)  BMI 22.33 kg/m2  SpO2 100% SpO2: SpO2: 100 % O2 Device: O2 Device: Not Delivered O2 Flow Rate:    Intake/output summary:  Intake/Output Summary (Last 24 hours) at 04/15/15 0916 Last data filed at 04/15/15 0827  Gross per 24 hour  Intake 4400.83 ml  Output    377 ml  Net 4023.83 ml   LBM:   Baseline Weight: Weight: 72.576 kg (160 lb) Most recent weight: Weight: 72.576 kg (160 lb)  Physical Exam: Gen.: Frail middle-aged man lying in the bed; he is alert, confused Respiratory: No worker breathing Cardiac: No edema Psych: Patient makes better eye contact today, words, speech clearer, but nonsensical. He is able to tell me his wife's name today, then begins to talk about getting this place cleaned up to              Additional Data Reviewed: Recent Labs  04/14/15  0526  04/15/15  0440  WBC  9.9  7.4  HGB  9.1*  8.6*  PLT  226  170  NA  136  139  BUN  7  6  CREATININE  0.69  0.67     Problem List:  Patient Active Problem List   Diagnosis Date Noted  . Aspiration pneumonia 04/14/2015  . Palliative care encounter   . Acute delirium 04/13/2015  . Femur fracture, left 04/12/2015  . Malnutrition of moderate degree 04/12/2015  . Lumbar stenosis with neurogenic claudication 04/08/2015  . Back pain 04/04/2015  . Lumbago   . Tobacco abuse   . Uncomplicated alcohol dependence   . Other pancytopenia   . Stridor 03/12/2013  . Narcotic overdose 03/12/2013  . Diarrhea 01/02/2012  . AVB (atrioventricular block) 01/02/2012  . Lithium toxicity 01/02/2012  . Benzodiazepine (tranquilizer) overdose 12/31/2011  . Hypotension due  to drugs 12/31/2011  . Opioid abuse, episodic use 12/17/2011  . Bipolar 1 disorder, depressed, moderate 12/06/2011  . Generalized anxiety disorder 12/06/2011  . Alcohol dependence in remission 12/06/2011  . Movement disorder 11/30/2011  . DRUG OVERDOSE 07/22/2007  . DISORDER, DENTAL NOS 05/21/2007  . ERECTILE DYSFUNCTION 11/25/2006  . SHOULDER PAIN, RIGHT, CHRONIC 11/25/2006  . PERIPHERAL NEUROPATHY 08/17/2006  . TACHYCARDIA, HX OF 08/17/2006  . HEPATITIS C 06/23/2006  . DISEASE, INFECTIOUS/PARASITIC NOS 06/23/2006  . Bipolar disorder 06/23/2006  . TOBACCO ABUSE 06/23/2006  . Essential hypertension 06/23/2006  . PSORIASIS 06/23/2006  . AVASCULAR NECROSIS 06/23/2006  . ALCOHOL ABUSE, HX OF 06/23/2006  . STATUS, SHOULDER JOINT REPLACEMENT 06/23/2006  . Hip joint replacement by other means 06/23/2006  . APPENDECTOMY, HX OF 06/23/2006     Palliative Care Assessment & Plan    Code Status:  DNR  Goals of Care:  Family meeting scheduled for 04/16/2015 at 2 PM to further address goals of care and disposition  Continue antibiotics for now  Continue lactulose to see if encephalopathy improves  Symptom Management:  Pain: Patient has a long history of polysubstance abuse including abuse of opioids. He does have a left hip fracture as well as a recent lumbar fusion. After receiving morphine last night he was able to be calmer and is more alert this morning. We'll do a trial of scheduled oxycodone 5 mg every 6 hours around-the-clock to see if unmanaged pain is in aspect of his ongoing delirium  Delirium: We'll continue to hold benzodiazepines. Starting scheduled opioids to see if unmanaged pain is in aspect of delirium. Continue with lactulose  Bipolar disorder: Per family patient is had minimal response to Seroquel even at higher doses of 600 mg daily, as well as nursing reports mentation improved with Zyprexa. We will DC Seroquel and increase Zyprexa to 15 mg at bedtime and 5 mg in  the morning. Continue with trazodone 200 mg at bedtime. Cont lithium and monitor closely for toxicity  Palliative Prophylaxis:  Continue lactulose  Psycho-social/Spiritual:  Desire for further Chaplaincy support:no   Prognosis: Unable to determine but likely eligible for hospice in home or SNF. He is at risk for an acute event but barring this, do not feel that he has a prognosis of weeks that wold make him eligible for in-patient hospice. Discharge Planning: TBD   Care plan was discussed with Dr. Malachi Bonds  Thank you for allowing the Palliative Medicine Team to assist in the care of this patient.   Time In: 0830 Time Out: 0905 Total Time 35 Prolonged Time Billed  no     Greater than 50%  of this time was spent counseling and coordinating care related to the above assessment and plan.   Irean Hong, NP  04/15/2015, 9:16 AM  Please contact Palliative Medicine Team phone at [phone] for questions and concerns.

## 2015-04-15 NOTE — Progress Notes (Signed)
On call, [place] NP contacted.  Patient BP 154/93, HR 118 and Temp 100.7.  Patient refuses po meds.  Agitation persists.  See new orders.

## 2015-04-15 NOTE — Progress Notes (Signed)
[facility] PROGRESS NOTE  [name] VHQ:[id number] DOB: [birth year] DOA: 04/11/2015 PCP: Arlan Organ., MD  Brief Summary  The patient is a 54 yo M with history of alcohol abuse, supposedly in remission since 11/2002 but later reported drinking 4 beers daily and vodka intermittently, peripheral neuropathy, bipolar disorder on lithium, hepatitis C with liver cirrhosis, GERD, chronic pain, hypertension, possible TIA.  He was he was hospitalized from 04/04/2015 through 04/11/2015 secondary to acute on chronic low back pain with sciatica. His MRI demonstrated severe degenerative joint disease from L3-L4 with severe spinal stenosis and inferior endplate Schmorl's node at T12.  He was seen by neurosurgery and underwent L4-L5 decompressive lumbar laminectomy, bilateral L4-L5 facetectomy, foraminotomies with decompression beyond that required for interbody arthrodesis.  He had L4-5 posterior l umbar interbody arthrodesis with AVS peek interbody implants, Vitoss BA with bone marrow aspirate and infusion by Drs Newell Coral and Venetia Maxon on 9/1.  His pain improved, however, his postoperative course was complicated by delirium and agitation attributed to alcohol withdrawal.  He had pancytopenia and macrocytosis suggestive of chronic alcohol abuse.  Lithium level during previous admission was less than threshold suggesting possible noncompliance with therapy or subtherapeutic dose.  His dose was not adjusted during that admission.  He also had a low grade fever that was attributed to possible aspiration pneumonia.  At the time of discharge, his fever had resolved, he was able to carry on a conversation with the discharging physician and had been recommended for SNF for rehab.  He was discharged to [facility] where he had a fall within a few hours of arrival and he was transported back to the ER for reevaluation.  He has history of bilateral total hip replacements.  His CT lumbar spine demonstrated no evidence of lower  spine fracture or subluxation, head CT was negative, but XR hips demonstrated a left total hip arthroplasty with acute appearing fracture of the intertrochanteric/greater trochanteric region of the left hip without displacement or dislocation.  He was admitted for further evaluation by orthopedic surgery.    Assessment/Plan  Left hip intertrochanteric/greater trochanter fracture with recurrent falls both in and out of hospital -  CT hip:  Nondisplaced comminuted fracture of the greater trochanter of the left femur with intact arthroplasty components -  Falls precautions and sitter at bedside -  Appreciate orthopedics assistance, no plan for imminent surgery  FUO, not planning further work up since anticipating transitioning to more comfort measures/palliative care.  Last imaging was on 9/7.  -  Continuing current antibiotics -  If we do decide to pursue further workup would involve probable echocardiogram, repeat chest x-ray and urinalysis, and infectious disease consultation.   Delirium, somewhat improved today.  DDx includes ongoing symptoms of delirium secondary to severe illness and prolonged hospitalization, clonidine cessation, hepatic encephalopathy.  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  He has previously had elevated lithium levels and may have lingering tremor and confusion from earlier toxicity. -  Continue to encourage rifaximin and lactulose, but decrease lactulose dose slightly -  HIV NR, RPR NR, vitamin B12 wnl -  TSH slightly abnormal 1 week ago -  Lithium level low repeatedly -  Zyprexa increased by palliative care -  Seroquel discontinued -  Continue trazodone 200 mg daily at bedtime -  Agree with scheduled narcotics  Sinus tachycardia -  Mildly elevated fT4 and normal TSH likely does not explain degree of sinus  tach -  Could be sign of imminent sepsis, already on antibiotics but having low grade  fevers -  Consider CT angio chest  At risk for dehydration due to not eating and drinking very much -  Continue IVF  Hypercalcemia likely due to primary hyperparathyroidism and recent bone surgery and now hip fracture, has been going on for years -  Phos wnl, PTH elevated and vitamin D levels low normal -  Defer to PCP for referral for surgery if he recovers from this illness  Alcohol abuse/hepatitis C/cirrhosis - Patient continues to drink up to 4 beers daily and vodka occasionally. Outside of window for withdrawal - Last ultrasound was January 2016 which demonstrated increased echogenicity and enlargement - Elevated ammonia level  Macrocytic anemia, hemoglobin continuing to drift down -  Likely related to ongoing alcohol abuse related bone marrow toxicity and cirrhosis. -  Iron studies c/w possible iron deficiency vs. Chronic disease -  Start once daily iron supplementation -  Repeat TSH wnl but fT4 mildly elevated, still likely secondary to sick euthyroid since only minimally elevated -  Vitamin b12 and folate were wnl on 04/07/2015  Essential hypertension with very elevated blood pressures -  Start metoprolol PO -  Continue clonidine patch  Bipolar disorder - resumed lithium, zyprexa -  Psychiatry consultation appreciated  Psoriasis - Patient has extensive skin psoriatic lesions on his back, limbs. Outpatient follow-up.  Aspiration pneumonia with low grade fever and patient refusing oral antibiotics.  UA negative. -  Continue IV antibiotics day 3 -  Blood cultures no growth to date  RUE swelling -  Duplex extremity pending  Diet:  Low sodium  Access:  PIV IVF:  yes Proph:  lovenox  Code Status:  DNR  Per conversation with wife.  Patient had previously asked for DNR and I agreed that given his frailty that that would be a good choice.   Family Communication: patient alone today.   Disposition Plan: pending family meeting tomorrow to discuss GOC.     Consultants:  [facility]  Palliative care  Psychiatry  Procedures:  CT lumbar spine  CT head  X-ray pelvis with left hip  CT left hip  Antibiotics:  Augmentin (Unasyn) 9/8  HPI/Subjective:  Patient states he continues to have some abdominal pain but cannot be more specific.  Denies SOB, chest pain, nausea.  Objective: Filed Vitals:   04/14/15 0525 04/14/15 2109 04/15/15 0520 04/15/15 1300  BP: 180/94 141/80 160/85 154/82  Pulse: 118 120 116 120  Temp: 99.1 F (37.3 C) 100.5 F (38.1 C) 98.2 F (36.8 C) 100 F (37.8 C)  TempSrc: Oral Axillary Axillary Oral  Resp: 18 16 18 18   Height:      Weight:      SpO2: 99% 99% 100% 100%    Intake/Output Summary (Last 24 hours) at 04/15/15 1609 Last data filed at 04/15/15 1306  Gross per 24 hour  Intake 4400.83 ml  Output    379 ml  Net 4021.83 ml   Filed Weights   04/11/15 2310  Weight: 72.576 kg (160 lb)   Body mass index is 22.33 kg/(m^2).  Exam:   General:  Adult male, No acute distress, lying in bed and able to focus briefly on conversation   HEENT:  NCAT, MMM  Cardiovascular:  RRR, nl S1, S2 no mrg, 2+ pulses, warm extremities  Respiratory:  CTAB, no increased WOB  Abdomen:   NABS, soft, NT/ND  MSK:   Decreased tone and bulk,  no LEE, extensive bruising over the left flank and left upper leg  Neuro:  Grossly moves all extremities  Data Reviewed: Basic Metabolic Panel:  Recent Labs Lab 04/10/15 0504 04/11/15 0630 04/11/15 2347 04/12/15 1636 04/13/15 0610 04/14/15 0526 04/15/15 0440  NA 138 140 135 138 134* 136 139  K 4.0 3.9 3.4* 3.5 4.3 3.6 3.0*  CL 108 109 105 107 104 103 106  CO2 22 23 23 25  21* 26 26  GLUCOSE 108* 95 114* 108* 108* 115* 115*  BUN 11 12 15 18 16 7 6   CREATININE 1.12 0.97 0.94 0.81 0.67 0.69 0.67  CALCIUM 11.9* 11.5* 11.4* 11.7*  11.6* 11.0* 10.7* 10.4*  PHOS 1.2* 2.8  --  2.8  --   --   --    Liver Function Tests:  Recent Labs Lab  04/10/15 0504 04/11/15 0630 04/12/15 1636  AST  --   --  70*  ALT  --   --  68*  ALKPHOS  --   --  96  BILITOT  --   --  1.5*  PROT  --   --  7.1  ALBUMIN 2.7* 2.9* 3.1*   No results for input(s): LIPASE, AMYLASE in the last 168 hours.  Recent Labs Lab 04/12/15 1531  AMMONIA 64*   CBC:  Recent Labs Lab 04/09/15 1220  04/11/15 2347 04/12/15 1636 04/13/15 0610 04/13/15 0846 04/14/15 0526 04/15/15 0440  WBC 8.8  < > 7.7 10.8* 17.4* 13.1* 9.9 7.4  NEUTROABS 7.1  --  6.0  --   --   --   --   --   HGB 8.2*  < > 9.4* 9.3* 10.1* 9.4* 9.1* 8.6*  HCT 25.5*  < > 28.5* 28.9* 29.1* 28.5* 27.3* 25.7*  MCV 104.5*  < > 102.9* 102.5* 99.3 101.1* 101.1* 101.2*  PLT 157  < > 200 271 PLATELET CLUMPS NOTED ON SMEAR, UNABLE TO ESTIMATE 304 226 170  < > = values in this interval not displayed.  Recent Results (from the past 240 hour(s))  Surgical pcr screen     Status: Abnormal   Collection Time: 04/05/15 11:19 PM  Result Value Ref Range Status   MRSA, PCR NEGATIVE NEGATIVE Final   Staphylococcus aureus POSITIVE (A) NEGATIVE Final    Comment:        The Xpert SA Assay (FDA approved for NASAL specimens in patients over 40 years of age), is one component of a comprehensive surveillance program.  Test performance has been validated by [facility] for patients greater than or equal to 17 year old. It is not intended to diagnose infection nor to guide or monitor treatment.   Culture, blood (routine x 2)     Status: None   Collection Time: 04/07/15  8:17 AM  Result Value Ref Range Status   Specimen Description BLOOD LEFT ARM  Final   Special Requests BOTTLES DRAWN AEROBIC AND ANAEROBIC 10CC  Final   Culture NO GROWTH 5 DAYS  Final   Report Status 04/12/2015 FINAL  Final  Culture, blood (routine x 2)     Status: None   Collection Time: 04/07/15  8:25 AM  Result Value Ref Range Status   Specimen Description BLOOD LEFT ARM  Final   Special Requests BOTTLES DRAWN AEROBIC AND  ANAEROBIC 10CC  Final   Culture NO GROWTH 5 DAYS  Final   Report Status 04/12/2015 FINAL  Final  Urine culture     Status: None   Collection Time: 04/07/15  12:10 PM  Result Value Ref Range Status   Specimen Description URINE, RANDOM  Final   Special Requests NONE  Final   Culture NO GROWTH 1 DAY  Final   Report Status 04/08/2015 FINAL  Final  Culture, blood (routine x 2)     Status: None (Preliminary result)   Collection Time: 04/13/15  8:46 AM  Result Value Ref Range Status   Specimen Description BLOOD LEFT ANTECUBITAL  Final   Special Requests BOTTLES DRAWN AEROBIC ONLY 5CC  Final   Culture NO GROWTH 2 DAYS  Final   Report Status PENDING  Incomplete  Culture, blood (routine x 2)     Status: None (Preliminary result)   Collection Time: 04/13/15  8:55 AM  Result Value Ref Range Status   Specimen Description BLOOD RIGHT ANTECUBITAL  Final   Special Requests BOTTLES DRAWN AEROBIC ONLY 5CC  Final   Culture NO GROWTH 2 DAYS  Final   Report Status PENDING  Incomplete     Studies: No results found.  Scheduled Meds: . ampicillin-sulbactam (UNASYN) IV  3 g Intravenous Q6H  . [START ON 04/19/2015] cloNIDine  0.2 mg Transdermal Q Wed  . enoxaparin (LOVENOX) injection  40 mg Subcutaneous Q24H  . feeding supplement (ENSURE ENLIVE)  237 mL Oral BID BM  . lactulose  30 g Oral BID  . lithium carbonate  900 mg Oral QHS  . metoprolol  5 mg Intravenous 3 times per day  . OLANZapine zydis  15 mg Oral QHS  . OLANZapine zydis  5 mg Oral Daily  . oxyCODONE  5 mg Oral 4 times per day  . pantoprazole  40 mg Oral BID  . permethrin   Topical Once  . rifaximin  550 mg Oral BID  . thiamine  100 mg Oral Daily  . traZODone  200 mg Oral QHS  . triamcinolone ointment  1 application Topical BID  . triamcinolone ointment  1 application Topical BID   Continuous Infusions: . dextrose 5 % and 0.45 % NaCl with KCl 10 mEq/L 50 mL/hr (04/14/15 0905)    Principal Problem:   Bipolar disorder Active  Problems:   Essential hypertension   Uncomplicated alcohol dependence   Femur fracture, left   Malnutrition of moderate degree   Acute delirium   Aspiration pneumonia   Palliative care encounter    Time spent: 30 min    Casin Federici, [facility]  [facility] Pager [phone]. If 7PM-7AM, please contact night-coverage at [email or web address], password [facility] 04/15/2015, 4:09 PM  LOS: 3 days

## 2015-04-16 ENCOUNTER — Inpatient Hospital Stay (HOSPITAL_COMMUNITY): Payer: Medicare Other

## 2015-04-16 DIAGNOSIS — M7989 Other specified soft tissue disorders: Secondary | ICD-10-CM

## 2015-04-16 LAB — COMPREHENSIVE METABOLIC PANEL
ALBUMIN: 2.7 g/dL — AB (ref 3.5–5.0)
ALT: 48 U/L (ref 17–63)
ANION GAP: 6 (ref 5–15)
AST: 60 U/L — AB (ref 15–41)
Alkaline Phosphatase: 107 U/L (ref 38–126)
BILIRUBIN TOTAL: 1.6 mg/dL — AB (ref 0.3–1.2)
BUN: 8 mg/dL (ref 6–20)
CHLORIDE: 103 mmol/L (ref 101–111)
CO2: 29 mmol/L (ref 22–32)
Calcium: 10.9 mg/dL — ABNORMAL HIGH (ref 8.9–10.3)
Creatinine, Ser: 0.74 mg/dL (ref 0.61–1.24)
GFR calc Af Amer: >60 mL/min (ref >60)
GFR calc non Af Amer: >60 mL/min (ref >60)
GLUCOSE: 117 mg/dL — AB (ref 65–99)
POTASSIUM: 3.1 mmol/L — AB (ref 3.5–5.1)
Sodium: 138 mmol/L (ref 135–145)
TOTAL PROTEIN: 6.3 g/dL — AB (ref 6.5–8.1)

## 2015-04-16 LAB — CBC
HEMATOCRIT: 26.8 % — AB (ref 39.0–52.0)
Hemoglobin: 8.7 g/dL — ABNORMAL LOW (ref 13.0–17.0)
MCH: 33.2 pg (ref 26.0–34.0)
MCHC: 32.5 g/dL (ref 30.0–36.0)
MCV: 102.3 fL — ABNORMAL HIGH (ref 78.0–100.0)
PLATELETS: 201 10*3/uL (ref 150–400)
RBC: 2.62 MIL/uL — ABNORMAL LOW (ref 4.22–5.81)
RDW: 13.1 % (ref 11.5–15.5)
WBC: 10.2 10*3/uL (ref 4.0–10.5)

## 2015-04-16 MED ORDER — MORPHINE SULFATE (CONCENTRATE) 10 MG/0.5ML PO SOLN
20.0000 mg | ORAL | Status: DC | PRN
  Administered 2015-04-17: 20 mg via ORAL
  Filled 2015-04-16: qty 1

## 2015-04-16 MED ORDER — MORPHINE SULFATE (CONCENTRATE) 10 MG/0.5ML PO SOLN
20.0000 mg | ORAL | Status: DC
  Administered 2015-04-16 – 2015-04-17 (×3): 20 mg via ORAL
  Filled 2015-04-16 (×4): qty 1

## 2015-04-16 MED ORDER — LORAZEPAM 0.5 MG PO TABS
0.5000 mg | ORAL_TABLET | Freq: Once | ORAL | Status: AC
  Administered 2015-04-16: 0.5 mg via ORAL
  Filled 2015-04-16: qty 1

## 2015-04-16 MED ORDER — LITHIUM CARBONATE 300 MG PO CAPS
900.0000 mg | ORAL_CAPSULE | Freq: Every day | ORAL | Status: DC
  Administered 2015-04-16 – 2015-04-17 (×2): 900 mg via ORAL
  Filled 2015-04-16 (×3): qty 3

## 2015-04-16 MED ORDER — LORAZEPAM 1 MG PO TABS
2.0000 mg | ORAL_TABLET | ORAL | Status: DC | PRN
  Administered 2015-04-17: 2 mg via ORAL
  Filled 2015-04-16 (×2): qty 2

## 2015-04-16 NOTE — Progress Notes (Signed)
Daily Progress Note   Patient Name: [name]       Date: 04/16/2015 DOB: July 13, 1961  Age: 54 y.o. MRN#: [id number] Attending Physician: Damon Fickle, MD Primary Care Physician: Damon Organ., MD Admit Date: 04/11/2015  Reason for Consultation/Follow-up: Establishing goals of care, Non pain symptom management, Pain control and Psychosocial/spiritual support  Subjective: Pt is more alert today. Able to sustain a conversation. He is paranoid, becoming religiously preoccupied. He reports that he "hurts all over". He is still disoriented to his situation, but on prompting does remember that he had back surgery. He thinks he lives in [place]. No psychomotor restlessness observed this am. He is running low grade temp at 100.5 and appears to have chills when I was in the room. Eating about 50% of meals. Was able to hold cup this am and drink . At this point pt appears to be able to sit up in bed independently but is not currently undergoing PT. Pain appears to be his limiting factor in terms of movement in the bed. Wife reports with bipolar exacerbation, pt is manic, paranoid. Family struggling with an indeterminate prognosis and how to care for pt going forward.Family meeting at 2pm scheduled with mother, father who drove down from [place] brother Damon Needle and his wife [name], and pt's wife [name], and Dr. Malachi Melendez. Despite seeing more mental alertness over the past 2 days, presented that pt's clinical picture overall is still trending towards EOL. Addressed multi-factorial causes: dementia, bipolar d/o, polysubstance abuse, chronic pain, protracted encephalopathy and now new fevers of unknown origin. Dr. Malachi Melendez discussed options going forward in terms of further work up for fevers, but family recognizing that pt would be unable to cope with further aggressive measures and want to pursue a comfort and dignity preserving approach. Interval Events: Restlessness, worse at night; low grade fevers Length  of Stay: 4 days  Current Medications: Scheduled Meds:  . ampicillin-sulbactam (UNASYN) IV  3 g Intravenous Q6H  . [START ON 04/19/2015] cloNIDine  0.2 mg Transdermal Q Wed  . enoxaparin (LOVENOX) injection  40 mg Subcutaneous Q24H  . feeding supplement (ENSURE ENLIVE)  237 mL Oral BID BM  . ferrous sulfate  325 mg Oral Q breakfast  . lactulose  20 g Oral BID  . lithium carbonate  900 mg Oral QHS  . metoprolol tartrate  25 mg Oral BID  . OLANZapine zydis  15 mg Oral QHS  . OLANZapine zydis  5 mg Oral Daily  . oxyCODONE  5 mg Oral 4 times per day  . pantoprazole  40 mg Oral BID  . permethrin   Topical Once  . rifaximin  550 mg Oral BID  . thiamine  100 mg Oral Daily  . traZODone  200 mg Oral QHS  . triamcinolone ointment  1 application Topical BID  . triamcinolone ointment  1 application Topical BID    Continuous Infusions: . dextrose 5 % and 0.45 % NaCl with KCl 10 mEq/L Stopped (04/15/15 2029)    PRN Meds: clobetasol ointment, hydrALAZINE, liver oil-zinc oxide, metoprolol, morphine injection  Palliative Performance Scale: 30-40%    Vital Signs: BP 126/70 mmHg  Pulse 138  Temp(Src) 100.5 F (38.1 C) (Axillary)  Resp 16  Ht 5\' 11"  (1.803 m)  Wt 72.576 kg (160 lb)  BMI 22.33 kg/m2  SpO2 99% SpO2: SpO2: 99 % O2 Device: O2 Device: Not Delivered O2 Flow Rate:    Intake/output summary:  Intake/Output Summary (Last 24 hours) at 04/16/15 1211  Last data filed at 04/16/15 1036  Gross per 24 hour  Intake    360 ml  Output    451 ml  Net    -91 ml   LBM:   Baseline Weight: Weight: 72.576 kg (160 lb) Most recent weight: Weight: 72.576 kg (160 lb)  Physical Exam: Gen: Middle aged man, alert and oriented to self only Resp: No work of breathing Skin: Multiple areas of bruising Neuro: No psychomotor restlessness observed. He is confused. Speech clearer Psych: Disoriented, paranoid ideation as well as religious preoccupation noted         Additional Data  Reviewed: Recent Labs     04/15/15  0440  04/16/15  0525  WBC  7.4  10.2  HGB  8.6*  8.7*  PLT  170  201  NA  139  138  BUN  6  8  CREATININE  0.67  0.74     Problem List:  Patient Active Problem List   Diagnosis Date Noted  . Aspiration pneumonia 04/14/2015  . Palliative care encounter   . Acute delirium 04/13/2015  . Femur fracture, left 04/12/2015  . Malnutrition of moderate degree 04/12/2015  . Lumbar stenosis with neurogenic claudication 04/08/2015  . Back pain 04/04/2015  . Lumbago   . Tobacco abuse   . Uncomplicated alcohol dependence   . Other pancytopenia   . Stridor 03/12/2013  . Narcotic overdose 03/12/2013  . Diarrhea 01/02/2012  . AVB (atrioventricular block) 01/02/2012  . Lithium toxicity 01/02/2012  . Benzodiazepine (tranquilizer) overdose 12/31/2011  . Hypotension due to drugs 12/31/2011  . Opioid abuse, episodic use 12/17/2011  . Bipolar 1 disorder, depressed, moderate 12/06/2011  . Generalized anxiety disorder 12/06/2011  . Alcohol dependence in remission 12/06/2011  . Movement disorder 11/30/2011  . DRUG OVERDOSE 07/22/2007  . DISORDER, DENTAL NOS 05/21/2007  . ERECTILE DYSFUNCTION 11/25/2006  . SHOULDER PAIN, RIGHT, CHRONIC 11/25/2006  . PERIPHERAL NEUROPATHY 08/17/2006  . TACHYCARDIA, HX OF 08/17/2006  . HEPATITIS C 06/23/2006  . DISEASE, INFECTIOUS/PARASITIC NOS 06/23/2006  . Bipolar disorder 06/23/2006  . TOBACCO ABUSE 06/23/2006  . Essential hypertension 06/23/2006  . PSORIASIS 06/23/2006  . AVASCULAR NECROSIS 06/23/2006  . ALCOHOL ABUSE, HX OF 06/23/2006  . STATUS, SHOULDER JOINT REPLACEMENT 06/23/2006  . Hip joint replacement by other means 06/23/2006  . APPENDECTOMY, HX OF 06/23/2006     Palliative Care Assessment & Plan    Code Status:  DNR  Goals of Care:  Comfort  Pt likely does not meet in-patient criteria with hospice at this point, but would meet in-home hospice criteria  Pt is NOT ABLE TO RETURN TO HIS  APARTMENT> WILL NEED SNF  Manage pt's pain  Symptom Management:  Pain: Recommend increasing oxycodone to 10 mg q6 atc and 5-10 q4 prn  Agitation: Still agitated, but will see if unmanaged pain is still a contributing factor in presentation before adding back ativan.  Bipolar: Not stabilizing. Complicated by protracted hospitalization, underlying medical illness. Concerned about lithium toxicity going forward. Would reconsulting psych be beneficial? Cont Zyprexa 5 mg qam and will increase HS to 20mg , cont lithium for now as well as trazadone    Psycho-social/Spiritual:  Desire for further Chaplaincy support:no   Prognosis: Unable to determine Discharge Planning: Skilled facility with hospice. Pt is not a rehab candidate   Care plan was discussed with Dr. Malachi Melendez, and family  Thank you for allowing the Palliative Medicine Team to assist in the care of this patient.   Time In:  1400 Time Out: 1520 Total Time 80 min Prolonged Time Billed  yes     Greater than 50%  of this time was spent counseling and coordinating care related to the above assessment and plan.   Irean Hong, NP  04/16/2015, 12:11 PM  Please contact Palliative Medicine Team phone at [phone] for questions and concerns.

## 2015-04-16 NOTE — Progress Notes (Signed)
[facility] PROGRESS NOTE  [name]:[id number] DOB: 1961/07/30 DOA: 04/11/2015 PCP: Arlan Organ., MD  Brief Summary  The patient is a 54 yo M with history of alcohol abuse, supposedly in remission since 11/2002 but later reported drinking 4 beers daily and vodka intermittently, peripheral neuropathy, bipolar disorder on lithium, hepatitis C with liver cirrhosis, GERD, chronic pain, hypertension, possible TIA.  He was he was hospitalized from 04/04/2015 through 04/11/2015 secondary to acute on chronic low back pain with sciatica. His MRI demonstrated severe degenerative joint disease from L3-L4 with severe spinal stenosis and inferior endplate Schmorl's node at T12.  He was seen by neurosurgery and underwent L4-L5 decompressive lumbar laminectomy, bilateral L4-L5 facetectomy, foraminotomies with decompression beyond that required for interbody arthrodesis.  He had L4-5 posterior l umbar interbody arthrodesis with AVS peek interbody implants, Vitoss BA with bone marrow aspirate and infusion by Drs Newell Coral and Venetia Maxon on 9/1.  His pain improved, however, his postoperative course was complicated by delirium and agitation attributed to alcohol withdrawal.  He had pancytopenia and macrocytosis suggestive of chronic alcohol abuse.  Lithium level during previous admission was less than threshold suggesting possible noncompliance with therapy or subtherapeutic dose.  His dose was not adjusted during that admission.  He also had a low grade fever that was attributed to possible aspiration pneumonia.  At the time of discharge, his fever had resolved, he was able to carry on a conversation with the discharging physician and had been recommended for SNF for rehab.  He was discharged to [facility] where he had a fall within a few hours of arrival and he was transported back to the ER for reevaluation.  He has history of bilateral total hip replacements.  His CT lumbar spine demonstrated no evidence of lower  spine fracture or subluxation, head CT was negative, but XR hips demonstrated a left total hip arthroplasty with acute appearing fracture of the intertrochanteric/greater trochanteric region of the left hip without displacement or dislocation.  He was admitted for further evaluation by orthopedic surgery.    9/11:  Had family meeting today with patient's parents, brother and sister-in-law, and wife. The patient has had some clinical improvement and is slightly more coherent over the last 24-48 hours, however he continues to be confused about where he is, what time it is.  He has pulled out his IV and refuses medications. He continues to try to get out of bed, has pressured speech. He has been eating slightly more but is still eating unreliably.  His prognosis is poor.  His entire family is in agreement that we should focus on symptom management and comfort only and not pursue any more painful procedures or testing to evaluate his fevers.  They understand that he could die in days to months.  He is a candidate for hospice.  If he declines quickly after cessation of antibiotics, he may qualify for inpatient hospice, however, if he maintains his current condition, he will be transferred to SNF.  Given his current agitation, he is not ready for discharge at this time.     Assessment/Plan  Left hip intertrochanteric/greater trochanter fracture with recurrent falls both in and out of hospital -  CT hip:  Nondisplaced comminuted fracture of the greater trochanter of the left femur with intact arthroplasty components -  Appreciate orthopedics assistance, no plan for imminent surgery  FUO, not planning further work up.  He continued to have fevers while on antibiotics for aspiration pneumonia.  Repeat CXR negative.  Repeat UA negative.  Blood cultures NGTD.   Last imaging was on 9/7 of his spine which looks intact.   -  D/c antibiotics  Delirium, continues to slowly improve.  DDx includes ongoing symptoms of  delirium secondary to severe illness and prolonged hospitalization, clonidine cessation, hepatic encephalopathy.  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  He has had history of lithium toxicity which can have lasting effects.  He also had some hepatic encephalopathy with elevated ammonia level and has been given lactulose and rifaximin.   -  Continue rifaximin and decrease lactulose to prevent diarrhea -  HIV NR, RPR NR, vitamin B12 wnl.   -  TSH slightly abnormal 1 week ago.   -  D/c lithium as we will not be monitoring levels -  Zyprexa increased by palliative care -  Seroquel discontinued -  Continue trazodone 200 mg daily at bedtime -  continue scheduled narcotics but start concentrated morphine since not reliably taking oral medications  Sinus tachycardia, worsening, but not planning further work up or treatment -  Mildly elevated fT4 and normal TSH likely does not explain degree of sinus tach -  Likely related to sepsis which we are not treating -  Could also represent PE  At risk for dehydration due to not eating and drinking very much.  No PIV and will not replace.    Hypercalcemia likely due to primary hyperparathyroidism, has been going on for years, mildly elevated -  Phos wnl, PTH elevated and vitamin D levels low normal -  No further work up or treatments  Alcohol abuse/hepatitis C/cirrhosis - Patient continues to drink up to 4 beers daily and vodka occasionally. Outside of window for withdrawal - Last ultrasound was January 2016 which demonstrated increased echogenicity and enlargement - Elevated ammonia level  Macrocytic anemia, hemoglobin stable around 8.7mg /dl -  Likely related to ongoing alcohol abuse related bone marrow toxicity and cirrhosis. -  Iron studies c/w possible iron deficiency vs. Chronic disease -  Discontinue iron supplementation and no further labs -  Repeat TSH wnl but fT4  mildly elevated, still likely secondary to sick euthyroid since only minimally elevated -  Vitamin b12 and folate were wnl on 04/07/2015  Essential hypertension with very elevated blood pressures.  Symptom management only.  D/c metoprolol and stop clonidine patch.  Bipolar disorder -  D/c lithium -  Continue zyprexa -  Psychiatry consultation appreciated  Psoriasis, continue steroid cream.  RUE swelling may reflect DVT, but not planning further work up at this time  Diet:  regular  Access:  None IVF:  off Proph:  none  Code Status:  DNR Family Communication:  Family meeting as above. Disposition Plan:   Comfort measures only  Consultants:  [facility]  Palliative care  Psychiatry  Procedures:  CT lumbar spine  CT head  X-ray pelvis with left hip  CT left hip  Antibiotics:  Augmentin (Unasyn) 9/8 > 9/11  HPI/Subjective:  Patient states he continues to have some abdominal pain.  Denies SOB, chest pain, nausea.  Not having as much stool incontinence per sitter.  Objective: Filed Vitals:   04/15/15 1934 04/16/15 0444 04/16/15 1007 04/16/15 1415  BP: 155/110 126/70  133/74  Pulse: 123 138  108  Temp: 98.5 F (36.9 C) 100.1 F (37.8 C) 100.5 F (38.1 C) 99.6 F (37.6 C)  TempSrc: Axillary Axillary    Resp: 17 16  18  Height:      Weight:      SpO2: 100% 99%  98%    Intake/Output Summary (Last 24 hours) at 04/16/15 1500 Last data filed at 04/16/15 1036  Gross per 24 hour  Intake    360 ml  Output    450 ml  Net    -90 ml   Filed Weights   04/11/15 2310  Weight: 72.576 kg (160 lb)   Body mass index is 22.33 kg/(m^2).  Exam:   General:  Adult male, No acute distress, lying in bed and able to focus briefly on conversation but still making nonsensical comments and restless  HEENT:  NCAT, MMM  Cardiovascular:  RRR, nl S1, S2 no mrg, 2+ pulses, warm extremities  Respiratory:  CTAB, no increased WOB  Abdomen:   NABS, soft,  NT/ND  MSK:   Decreased tone and bulk, no LEE, extensive bruising over the left flank and left upper leg  Neuro:  Grossly moves all extremities  Data Reviewed: Basic Metabolic Panel:  Recent Labs Lab 04/10/15 0504 04/11/15 0630  04/12/15 1636 04/13/15 0610 04/14/15 0526 04/15/15 0440 04/16/15 0525  NA 138 140  < > 138 134* 136 139 138  K 4.0 3.9  < > 3.5 4.3 3.6 3.0* 3.1*  CL 108 109  < > 107 104 103 106 103  CO2 22 23  < > 25 21* GLUCOSE 108* 95  < > 108* 108* 115* 115* 117*  BUN 11 12  < > CREATININE 1.12 0.97  < > 0.81 0.67 0.69 0.67 0.74  CALCIUM 11.9* 11.5*  < > 11.7*  11.6* 11.0* 10.7* 10.4* 10.9*  PHOS 1.2* 2.8  --  2.8  --   --   --   --   < > = values in this interval not displayed. Liver Function Tests:  Recent Labs Lab 04/10/15 0504 04/11/15 0630 04/12/15 1636 04/16/15 0525  AST  --   --  70* 60*  ALT  --   --  68* 48  ALKPHOS  --   --  96 107  BILITOT  --   --  1.5* 1.6*  PROT  --   --  7.1 6.3*  ALBUMIN 2.7* 2.9* 3.1* 2.7*   No results for input(s): LIPASE, AMYLASE in the last 168 hours.  Recent Labs Lab 04/12/15 1531  AMMONIA 64*   CBC:  Recent Labs Lab 04/11/15 2347  04/13/15 0610 04/13/15 0846 04/14/15 0526 04/15/15 0440 04/16/15 0525  WBC 7.7  < > 17.4* 13.1* 9.9 7.4 10.2  NEUTROABS 6.0  --   --   --   --   --   --   HGB 9.4*  < > 10.1* 9.4* 9.1* 8.6* 8.7*  HCT 28.5*  < > 29.1* 28.5* 27.3* 25.7* 26.8*  MCV 102.9*  < > 99.3 101.1* 101.1* 101.2* 102.3*  PLT 200  < > PLATELET CLUMPS NOTED ON SMEAR, UNABLE TO ESTIMATE 304 226 170 201  < > = values in this interval not displayed.  Recent Results (from the past 240 hour(s))  Culture, blood (routine x 2)     Status: None   Collection Time: 04/07/15  8:17 AM  Result Value Ref Range Status   Specimen Description BLOOD LEFT ARM  Final   Special Requests BOTTLES DRAWN AEROBIC AND ANAEROBIC 10CC  Final   Culture NO GROWTH 5 DAYS  Final   Report Status 04/12/2015  FINAL  Final  Culture, blood (routine x 2)     Status: None   Collection Time: 04/07/15  8:25 AM  Result Value Ref Range Status   Specimen Description BLOOD LEFT ARM  Final   Special Requests BOTTLES DRAWN AEROBIC AND ANAEROBIC 10CC  Final   Culture NO GROWTH 5 DAYS  Final   Report Status 04/12/2015 FINAL  Final  Urine culture     Status: None   Collection Time: 04/07/15 12:10 PM  Result Value Ref Range Status   Specimen Description URINE, RANDOM  Final   Special Requests NONE  Final   Culture NO GROWTH 1 DAY  Final   Report Status 04/08/2015 FINAL  Final  Culture, blood (routine x 2)     Status: None (Preliminary result)   Collection Time: 04/13/15  8:46 AM  Result Value Ref Range Status   Specimen Description BLOOD LEFT ANTECUBITAL  Final   Special Requests BOTTLES DRAWN AEROBIC ONLY 5CC  Final   Culture NO GROWTH 2 DAYS  Final   Report Status PENDING  Incomplete  Culture, blood (routine x 2)     Status: None (Preliminary result)   Collection Time: 04/13/15  8:55 AM  Result Value Ref Range Status   Specimen Description BLOOD RIGHT ANTECUBITAL  Final   Special Requests BOTTLES DRAWN AEROBIC ONLY 5CC  Final   Culture NO GROWTH 2 DAYS  Final   Report Status PENDING  Incomplete     Studies: Dg Chest Port 1 View  04/15/2015   CLINICAL DATA:  Low-grade fever.  Altered mental status.  EXAM: PORTABLE CHEST - 1 VIEW  COMPARISON:  04/09/2015  FINDINGS: Cardiac silhouette is upper limits of normal to mildly enlarged in size. Patient has taken a slightly greater inspiration than on the prior study. No confluent airspace opacity, edema, pleural effusion, or and pneumothorax is identified. Bilateral shoulder arthroplasties are noted. Prior cervical spine fusion.  IMPRESSION: No active disease.   Electronically Signed   By: Sebastian Ache M.D.   On: 04/15/2015 21:41    Scheduled Meds: . feeding supplement (ENSURE ENLIVE)  237 mL Oral BID BM  . lactulose  20 g Oral BID  . lithium carbonate   900 mg Oral QHS  . morphine CONCENTRATE  20 mg Oral Q4H  . OLANZapine zydis  15 mg Oral QHS  . OLANZapine zydis  5 mg Oral Daily  . permethrin   Topical Once  . rifaximin  550 mg Oral BID  . traZODone  200 mg Oral QHS  . triamcinolone ointment  1 application Topical BID  . triamcinolone ointment  1 application Topical BID   Continuous Infusions:    Principal Problem:   Bipolar disorder Active Problems:   Essential hypertension   Uncomplicated alcohol dependence   Femur fracture, left   Malnutrition of moderate degree   Acute delirium   Aspiration pneumonia   Palliative care encounter    Time spent: 30 min    Debra Colon, [facility]  [facility] Pager [phone]. If 7PM-7AM, please contact night-coverage at [email or web address], password [facility] 04/16/2015, 3:00 PM  LOS: 4 days

## 2015-04-16 NOTE — Progress Notes (Signed)
VASCULAR LAB PRELIMINARY  PRELIMINARY  PRELIMINARY  PRELIMINARY  Right upper extremity venous duplex completed.    Preliminary report:  There is no DVT noted in the right lower extremity. There is superficial thrombosis noted in the right cephalic vein at the [facility].  Gabreil [place], RVT 04/16/2015, 3:26 PM

## 2015-04-16 NOTE — Care Management Note (Addendum)
Case Management Note  Patient Details  Name: Damon MILLEYMRN: [id number] Date of Birth: October 24, 1960  Subjective/Objective:                  Fall, hip pain with fx, confusion.  Pt has a history of ETOH abuse, multiple falls, peripheral neuropathy, bipolar disorder on lithium, hepatitis C with liver cirrhosis, GERD, chronic pain, hypertension, possible TIA. He was he was hospitalized from 04/04/2015 through 04/11/2015 secondary to acute on chronic low back pain with sciatica. Pt had a fall within a couple hours of admission to SNF and was admitted back to hosptial with hip fracture.   Action/Plan: Cm went to room to speak to family about possible discharge plan but no family available at this time and pt is confused. Cm will continue to monitor for discharge planning needs. CM notes that per MD note after family meeting, that pt discharge disposition is pending his progression and is looking at hospice vs SNF.  Cm will continue to monitor for discharge planning needs.     Expected Discharge Date:                  Expected Discharge Plan:  SNF vs hospice  In-House Referral:     Discharge planning Services     Post Acute Care Choice:    Choice offered to:     DME Arranged:    DME Agency:     HH Arranged:    HH Agency:     Status of Service:  In progress  Medicare Important Message Given:    Date Medicare IM Given:    Medicare IM give by:    Date Additional Medicare IM Given:    Additional Medicare Important Message give by:     If discussed at Long Length of Stay Meetings, dates discussed:    Additional Comments:  Darcel Smalling, RN 04/16/2015, 3:10 PM

## 2015-04-16 NOTE — Progress Notes (Signed)
ANTIBIOTIC CONSULT NOTE - INITIAL  Pharmacy Consult for Unasyn Indication: asp pna  Allergies  Allergen Reactions  . Prochlorperazine Maleate Other (See Comments)    REACTION: jaw lock  . Erythromycin Other (See Comments)    REACTION: GI upset  . Compazine [Prochlorperazine Edisylate]     Jaw locks  . Hydromorphone Hcl Itching    REACTION: itching    Patient Measurements: Height:  (180.3 cm) Weight: 160 lb (72.576 kg) IBW/kg (Calculated) : 75.3  Vital Signs: Temp: 100.1 F (37.8 C) (09/11 0444) Temp Source: Axillary (09/11 0444) BP: 126/70 mmHg (09/11 0444) Pulse Rate: 138 (09/11 0444) Intake/Output from previous day: 09/10 0701 - 09/11 0700 In: 360 [P.O.:360] Out: 377 [Urine:375; Stool:2] Intake/Output from this shift: Total I/O In: 120 [P.O.:120] Out: -   Labs:  Recent Labs  04/14/15 0526 04/15/15 0440 04/16/15 0525  WBC 9.9 7.4 10.2  HGB 9.1* 8.6* 8.7*  PLT 226 170 201  CREATININE 0.69 0.67 0.74   Estimated Creatinine Clearance: 105.9 mL/min (by C-G formula based on Cr of 0.74). No results for input(s): VANCOTROUGH, VANCOPEAK, VANCORANDOM, GENTTROUGH, GENTPEAK, GENTRANDOM, TOBRATROUGH, TOBRAPEAK, TOBRARND, AMIKACINPEAK, AMIKACINTROU, AMIKACIN in the last 72 hours.   Microbiology: Recent Results (from the past 720 hour(s))  Surgical pcr screen     Status: Abnormal   Collection Time: 04/05/15 11:19 PM  Result Value Ref Range Status   MRSA, PCR NEGATIVE NEGATIVE Final   Staphylococcus aureus POSITIVE (A) NEGATIVE Final    Comment:        The Xpert SA Assay (FDA approved for NASAL specimens in patients over 48 years of age), is one component of a comprehensive surveillance program.  Test performance has been validated by [facility] for patients greater than or equal to 27 year old. It is not intended to diagnose infection nor to guide or monitor treatment.   Culture, blood (routine x 2)     Status: None   Collection Time: 04/07/15  8:17  AM  Result Value Ref Range Status   Specimen Description BLOOD LEFT ARM  Final   Special Requests BOTTLES DRAWN AEROBIC AND ANAEROBIC 10CC  Final   Culture NO GROWTH 5 DAYS  Final   Report Status 04/12/2015 FINAL  Final  Culture, blood (routine x 2)     Status: None   Collection Time: 04/07/15  8:25 AM  Result Value Ref Range Status   Specimen Description BLOOD LEFT ARM  Final   Special Requests BOTTLES DRAWN AEROBIC AND ANAEROBIC 10CC  Final   Culture NO GROWTH 5 DAYS  Final   Report Status 04/12/2015 FINAL  Final  Urine culture     Status: None   Collection Time: 04/07/15 12:10 PM  Result Value Ref Range Status   Specimen Description URINE, RANDOM  Final   Special Requests NONE  Final   Culture NO GROWTH 1 DAY  Final   Report Status 04/08/2015 FINAL  Final  Culture, blood (routine x 2)     Status: None (Preliminary result)   Collection Time: 04/13/15  8:46 AM  Result Value Ref Range Status   Specimen Description BLOOD LEFT ANTECUBITAL  Final   Special Requests BOTTLES DRAWN AEROBIC ONLY 5CC  Final   Culture NO GROWTH 2 DAYS  Final   Report Status PENDING  Incomplete  Culture, blood (routine x 2)     Status: None (Preliminary result)   Collection Time: 04/13/15  8:55 AM  Result Value Ref Range Status   Specimen Description BLOOD  RIGHT ANTECUBITAL  Final   Special Requests BOTTLES DRAWN AEROBIC ONLY 5CC  Final   Culture NO GROWTH 2 DAYS  Final   Report Status PENDING  Incomplete    Medical History: Past Medical History  Diagnosis Date  . Tachycardia   . Peripheral neuropathy   . Alcohol abuse     hx of quit 4/04  . Bilateral external ear infections 2007  . Tobacco abuse   . Bipolar affective disorder     Followed by Dr. [name]  . Avascular necrosis     SP right and left hip replacements and L shoulder arthroplasty, secondary to steroid use.  Has been on embrel in past.  . Psoriasis     Previously on embrel, stopped because of price.   . Hypertension   . Hepatitis  C   . ARF (acute renal failure) 2008    Multifactorial in setting of over use of ibuprofen and HCTZ./ACEI  . Cirrhosis   . Back pain   . Pneumonia     hx  . Heart murmur   . Stroke 11    ? "mini stroke"  . GERD (gastroesophageal reflux disease)     occ  . Arthritis   . Pharyngeal edema 03/12/2013  . Alcohol abuse   . Benzodiazepine abuse   . Carotid atherosclerosis   . Pectus excavatum     Medications:  Prescriptions prior to admission  Medication Sig Dispense Refill Last Dose  . acetaminophen (TYLENOL) 325 MG tablet Take 2 tablets (650 mg total) by mouth every 4 (four) hours as needed. (Patient taking differently: Take 650 mg by mouth every 4 (four) hours as needed. )   UNK  . amLODipine (NORVASC) 5 MG tablet Take 2 tablets (10 mg total) by mouth daily. 60 tablet 0 unk  . aspirin 81 MG tablet Take 81 mg by mouth daily.   unk  . augmented betamethasone dipropionate (DIPROLENE-AF) 0.05 % ointment Apply 1 application topically 2 (two) times daily.  5 04/11/2015 at Unknown time  . clobetasol ointment (TEMOVATE) 0.05 % Apply 1 application topically daily as needed. Apply affected areas for psoriasis   04/11/2015 at Unknown time  . fluticasone (CUTIVATE) 0.005 % ointment Apply 1 application topically 2 (two) times daily.  5 04/11/2015 at Unknown time  . lithium carbonate 300 MG capsule Take 900 mg by mouth at bedtime.    UNK  . omeprazole (PRILOSEC) 20 MG capsule Take 1 capsule (20 mg total) by mouth 2 (two) times daily. 60 capsule 1 04/11/2015 at Unknown time  . oxyCODONE (OXY IR/ROXICODONE) 5 MG immediate release tablet Take 1-2 tablets (5-10 mg total) by mouth every 4 (four) hours as needed for moderate pain or breakthrough pain. 10 tablet 0 UNK  . oxyCODONE-acetaminophen (PERCOCET/ROXICET) 5-325 MG per tablet Take 1 tablet by mouth every 6 (six) hours as needed for moderate pain.   0 UNK  . traZODone (DESYREL) 100 MG tablet Take 200 mg by mouth at bedtime.   04/11/2015 at Unknown time  .  amoxicillin-clavulanate (AUGMENTIN) 875-125 MG per tablet Take 1 tablet by mouth 2 (two) times daily. (Patient not taking: Reported on 04/12/2015) 6 tablet 0   . cloNIDine (CATAPRES) 0.3 MG tablet Take 0.3 mg by mouth 2 (two) times daily.  3   . OLANZapine (ZYPREXA) 10 MG tablet Take 10 mg by mouth at bedtime.     . sertraline (ZOLOFT) 100 MG tablet Take 100 mg by mouth daily.      Assessment:  54 y.o. male presents s/p fall with hip pain. Recently discharged 9/6 to SNF on Augmentin x 3 more days for fever. To begin Unasyn for asp pna.   WBC 10.2, Tmax 100.7. SCr 0.74, est CrCl > 100 ml/min.  Goal of Therapy:  Resolution of infection  Plan:  Unasyn 3gm IV q6h Will f/u micro data, pt's clinical condition, and renal function  Greggory Stallion, PharmD Clinical Pharmacy Resident Pager # [phone] 04/16/2015 9:51 AM

## 2015-04-17 ENCOUNTER — Encounter: Payer: Self-pay | Admitting: Neurology

## 2015-04-17 DIAGNOSIS — S42252A Displaced fracture of greater tuberosity of left humerus, initial encounter for closed fracture: Secondary | ICD-10-CM

## 2015-04-17 DIAGNOSIS — Z9114 Patient's other noncompliance with medication regimen: Secondary | ICD-10-CM

## 2015-04-17 DIAGNOSIS — S42253A Displaced fracture of greater tuberosity of unspecified humerus, initial encounter for closed fracture: Secondary | ICD-10-CM | POA: Insufficient documentation

## 2015-04-17 MED ORDER — CLONAZEPAM 0.5 MG PO TABS
0.5000 mg | ORAL_TABLET | Freq: Two times a day (BID) | ORAL | Status: DC
  Administered 2015-04-17 – 2015-04-18 (×2): 0.5 mg via ORAL
  Filled 2015-04-17 (×2): qty 1

## 2015-04-17 MED ORDER — DIPHENHYDRAMINE HCL 12.5 MG/5ML PO ELIX
6.2500 mg | ORAL_SOLUTION | Freq: Four times a day (QID) | ORAL | Status: DC | PRN
  Administered 2015-04-17: 6.25 mg via ORAL
  Filled 2015-04-17: qty 10

## 2015-04-17 MED ORDER — MORPHINE SULFATE (CONCENTRATE) 10 MG/0.5ML PO SOLN
20.0000 mg | Freq: Three times a day (TID) | ORAL | Status: DC
  Administered 2015-04-17 (×3): 20 mg via ORAL
  Filled 2015-04-17 (×3): qty 1

## 2015-04-17 MED ORDER — DIPHENHYDRAMINE HCL 50 MG/ML IJ SOLN: 12.5000 mg | Freq: Four times a day (QID) | INTRAMUSCULAR | Status: DC | PRN

## 2015-04-17 NOTE — Consult Note (Signed)
[facility] Face-to-Face Psychiatry Consult Follow Up  Reason for Consult:  Bipolar agitation Referring Physician:  Dr. Sheran Fava Patient Identification: [name] MRN:  [id number] Principal Diagnosis: Bipolar disorder Diagnosis:   Patient Active Problem List   Diagnosis Date Noted  . Aspiration pneumonia [J69.0] 04/14/2015  . Palliative care encounter [Z51.5]   . Acute delirium [R41.0] 04/13/2015  . Femur fracture, left [S72.92XA] 04/12/2015  . Malnutrition of moderate degree [E44.0] 04/12/2015  . Lumbar stenosis with neurogenic claudication [M48.06] 04/08/2015  . Back pain [M54.9] 04/04/2015  . Lumbago [M54.5]   . Tobacco abuse [Z72.0]   . Uncomplicated alcohol dependence [F10.20]   . Other pancytopenia [D61.818]   . Stridor [R06.1] 03/12/2013  . Narcotic overdose [T40.601A] 03/12/2013  . Diarrhea [R19.7] 01/02/2012  . AVB (atrioventricular block) [I44.30] 01/02/2012  . Lithium toxicity [T56.891A] 01/02/2012  . Benzodiazepine (tranquilizer) overdose [T42.4X1A] 12/31/2011  . Hypotension due to drugs [I95.2] 12/31/2011  . Opioid abuse, episodic use [F11.10] 12/17/2011  . Bipolar 1 disorder, depressed, moderate [F31.32] 12/06/2011  . Generalized anxiety disorder [F41.1] 12/06/2011  . Alcohol dependence in remission [F10.21] 12/06/2011  . Movement disorder [G25.9] 11/30/2011  . DRUG OVERDOSE [T50.901A] 07/22/2007  . DISORDER, DENTAL NOS [K08.9] 05/21/2007  . ERECTILE DYSFUNCTION [F52.8] 11/25/2006  . SHOULDER PAIN, RIGHT, CHRONIC [M25.519] 11/25/2006  . PERIPHERAL NEUROPATHY [G60.9] 08/17/2006  . TACHYCARDIA, HX OF [Z86.79] 08/17/2006  . HEPATITIS C [B17.10] 06/23/2006  . DISEASE, INFECTIOUS/PARASITIC NOS [B99.9] 06/23/2006  . Bipolar disorder [F31.9] 06/23/2006  . TOBACCO ABUSE [Z72.0] 06/23/2006  . Essential hypertension [I10] 06/23/2006  . PSORIASIS [L40.8] 06/23/2006  . AVASCULAR NECROSIS [M87.9] 06/23/2006  . ALCOHOL ABUSE, HX OF [F10.21] 06/23/2006  . STATUS, SHOULDER  JOINT REPLACEMENT [Z96.619] 06/23/2006  . Hip joint replacement by other means [Z96.60] 06/23/2006  . APPENDECTOMY, HX OF [Z90.89] 06/23/2006    Total Time spent with patient: 30 minutes  Subjective:   [name] is a 54 y.o. male patient admitted with bipolar agitation.  HPI:  [name] is a 54 yo  male seen face-to-face for psychiatric evaluation and consultation for bipolar disorder with a depressed mood and agitation. Patient is currently sedated with medication management so he was not able to participate in psychiatric evaluation. Patient needed further evaluation when he was awake and able to participate appropriately. Most of the information for the safety evaluation obtained from the available medical records at this time. Patient has a history of alcohol abuse  in remission since 11/2002 but later reported drinking 4 beers daily and vodka intermittently, peripheral neuropathy, bipolar disorder on lithium, hepatitis C with liver cirrhosis, GERD, chronic pain, hypertension, possible TIA.Reportedly patient is currently in acute delirium while awake. Patient was he was hospitalized from 04/04/2015 through 04/11/2015 secondary to acute on chronic low back pain with sciatica. Lithium level is 0.27 which was less than therapeutic levels  suggesting possible noncompliance with therapy or subtherapeutic dose. He was discharged to [facility] where he had a fall within a few hours of arrival and he was transported back to the ER for reevaluation.   HPI Elements:   Location:  Bipolar depression. Quality:  Poor. Severity:  Unable to care for himself. Timing:  Noncompliant with medication. Duration:  Few weeks. Context:  Psychosocial stressors multiple medical problems.   Interval history: patient seen lying on his bed and having hard time to keep covered with cloths, has multiple family members in his room including his brother, mother, wife and sister in law. Patient is awake, alert,  poorly oriented and cotinnue to be agitated and confused. Patient serum lithium level is low therapeutic and not toxic at this time. He has no suicide or homicide ideation, intention or plans. He will be seen by palliative care and pending out of home placement when medically cleared.   Past Medical History:  Past Medical History  Diagnosis Date  . Tachycardia   . Peripheral neuropathy   . Alcohol abuse     hx of quit 4/04  . Bilateral external ear infections 2007  . Tobacco abuse   . Bipolar affective disorder     Followed by Dr. Wallace Going  . Avascular necrosis     SP right and left hip replacements and L shoulder arthroplasty, secondary to steroid use.  Has been on embrel in past.  . Psoriasis     Previously on embrel, stopped because of price.   . Hypertension   . Hepatitis C   . ARF (acute renal failure) 2008    Multifactorial in setting of over use of ibuprofen and HCTZ./ACEI  . Cirrhosis   . Back pain   . Pneumonia     hx  . Heart murmur   . Stroke 11    ? "mini stroke"  . GERD (gastroesophageal reflux disease)     occ  . Arthritis   . Pharyngeal edema 03/12/2013  . Alcohol abuse   . Benzodiazepine abuse   . Carotid atherosclerosis   . Pectus excavatum     Past Surgical History  Procedure Laterality Date  . Right total hip replacement  2004    Due to AVN  . Left total hip replacement  2001    Due to AVN  . Closed manipulation, pinning, application of a traction pin right  2002  . Right shoulder arthroplasty      Due to AVN  . Appendectomy  1989  . Joint replacement Left 02    shoulder  . Tonsillectomy    . Hernia repair    . Anterior fusion cervical spine  03/10/2013  . Anterior cervical decomp/discectomy fusion N/A 03/10/2013    Procedure: Cervical Two-Three, Three to Four, Cervical Four to Five, Cervical Five to Six anterior cervical decompression with fusion plating and bonegraft;  Surgeon: Hosie Spangle, MD;  Location: [place] [facility];  Service:  Neurosurgery;  Laterality: N/A;  ANTERIOR CERVICAL DECOMPRESSION/DISCECTOMY FUSION 4 LEVELS  . Esophagogastroduodenoscopy N/A 04/11/2014    Procedure: ESOPHAGOGASTRODUODENOSCOPY (EGD);  Surgeon: Wonda Horner, MD;  Location: [facility];  Service: Endoscopy;  Laterality: N/A;  . Esophagogastroduodenoscopy N/A 04/11/2014    Procedure: ESOPHAGOGASTRODUODENOSCOPY (EGD);  Surgeon: Wonda Horner, MD;  Location: [facility];  Service: Endoscopy;  Laterality: N/A;   Family History:  Family History  Problem Relation Age of Onset  . Hypertension Mother   . Bipolar disorder Father   . Obesity Father   . Obesity Brother    Social History:  History  Alcohol Use No    Comment: Previously alcoholic, denies ETOH for "past few weeks"     History  Drug Use No    Social History   Social History  . Marital Status: Married    Spouse Name: N/A  . Number of Children: N/A  . Years of Education: N/A   Social History Main Topics  . Smoking status: Current Every Day Smoker -- 1.00 packs/day for 35 years    Types: Cigarettes  . Smokeless tobacco: Never Used     Comment: Not interested in stopping.  i want to quit togeather with my wife "  . Alcohol Use: No     Comment: Previously alcoholic, denies ETOH for "past few weeks"  . Drug Use: No  . Sexual Activity: No   Other Topics Concern  . None   Social History Narrative   Patient has problems with his girlfriend who he refers to as an alcoholic.    Additional Social History:                          Allergies:   Allergies  Allergen Reactions  . Prochlorperazine Maleate Other (See Comments)    REACTION: jaw lock  . Erythromycin Other (See Comments)    REACTION: GI upset  . Compazine [Prochlorperazine Edisylate]     Jaw locks  . Hydromorphone Hcl Itching    REACTION: itching    Labs:  Results for orders placed or performed during the hospital encounter of 04/11/15 (from the past 48 hour(s))  CBC     Status: Abnormal    Collection Time: 04/16/15  5:25 AM  Result Value Ref Range   WBC 10.2 4.0 - 10.5 K/uL   RBC 2.62 (L) 4.22 - 5.81 MIL/uL   Hemoglobin 8.7 (L) 13.0 - 17.0 g/dL   HCT 26.8 (L) 39.0 - 52.0 %   MCV 102.3 (H) 78.0 - 100.0 fL   MCH 33.2 26.0 - 34.0 pg   MCHC 32.5 30.0 - 36.0 g/dL   RDW 13.1 11.5 - 15.5 %   Platelets 201 150 - 400 K/uL  Comprehensive metabolic panel     Status: Abnormal   Collection Time: 04/16/15  5:25 AM  Result Value Ref Range   Sodium 138 135 - 145 mmol/L   Potassium 3.1 (L) 3.5 - 5.1 mmol/L   Chloride 103 101 - 111 mmol/L   CO2 29 22 - 32 mmol/L   Glucose, Bld 117 (H) 65 - 99 mg/dL   BUN 8 6 - 20 mg/dL   Creatinine, Ser 0.74 0.61 - 1.24 mg/dL   Calcium 10.9 (H) 8.9 - 10.3 mg/dL   Total Protein 6.3 (L) 6.5 - 8.1 g/dL   Albumin 2.7 (L) 3.5 - 5.0 g/dL   AST 60 (H) 15 - 41 U/L   ALT 48 17 - 63 U/L   Alkaline Phosphatase 107 38 - 126 U/L   Total Bilirubin 1.6 (H) 0.3 - 1.2 mg/dL   GFR calc non Af Amer >60 >60 mL/min   GFR calc Af Amer >60 >60 mL/min    Comment: (NOTE) The eGFR has been calculated using the CKD EPI equation. This calculation has not been validated in all clinical situations. eGFR's persistently <60 mL/min signify possible Chronic Kidney Disease.    Anion gap 6 5 - 15    Vitals: Blood pressure 119/75, pulse 120, temperature 97.7 F (36.5 C), temperature source Tympanic, resp. rate 16, height $RemoveBe'5\' 11"'IbZAiKbgf$  (1.803 m), weight 72.576 kg (160 lb), SpO2 96 %.  Risk to Self: Is patient at risk for suicide?: No Risk to Others:   Prior Inpatient Therapy:   Prior Outpatient Therapy:    Current Facility-Administered Medications  Medication Dose Route Frequency Provider Last Rate Last Dose  . clobetasol ointment (TEMOVATE) 0.35 % 1 application  1 application Topical Daily PRN Etta Quill, DO      . feeding supplement (ENSURE ENLIVE) (ENSURE ENLIVE) liquid 237 mL  237 mL Oral BID BM [place], RD   237 mL at  04/17/15 1043  . lactulose (CHRONULAC) 10  GM/15ML solution 20 g  20 g Oral BID Janece Canterbury, MD   20 g at 04/16/15 1008  . lithium carbonate capsule 900 mg  900 mg Oral QHS Janece Canterbury, MD   900 mg at 04/16/15 2135  . liver oil-zinc oxide (DESITIN) 40 % ointment   Topical Q1H PRN Janece Canterbury, MD      . LORazepam (ATIVAN) tablet 2 mg  2 mg Oral Q4H PRN Janece Canterbury, MD   2 mg at 04/17/15 0225  . morphine CONCENTRATE 10 MG/0.5ML oral solution 20 mg  20 mg Oral Q4H Janece Canterbury, MD   20 mg at 04/17/15 0059  . morphine CONCENTRATE 10 MG/0.5ML oral solution 20 mg  20 mg Oral Q1H PRN Janece Canterbury, MD   20 mg at 04/17/15 0225  . OLANZapine zydis (ZYPREXA) disintegrating tablet 15 mg  15 mg Oral QHS Dory Horn, NP   15 mg at 04/16/15 2135  . OLANZapine zydis (ZYPREXA) disintegrating tablet 5 mg  5 mg Oral Daily Dory Horn, NP   5 mg at 04/17/15 1042  . permethrin (ELIMITE) 5 % cream   Topical Once Janece Canterbury, MD      . rifaximin Doreene Nest) tablet 550 mg  550 mg Oral BID Janece Canterbury, MD   550 mg at 04/17/15 1041  . traZODone (DESYREL) tablet 200 mg  200 mg Oral QHS Janece Canterbury, MD   200 mg at 04/16/15 2135    Musculoskeletal: Strength & Muscle Tone: decreased Gait & Station: unable to stand Patient leans: N/A  Psychiatric Specialty Exam: Physical Exam as per history and physical   ROS unable to perform at this time   Blood pressure 119/75, pulse 120, temperature 97.7 F (36.5 C), temperature source Tympanic, resp. rate 16, height $RemoveBe'5\' 11"'fUwAXUmlg$  (1.803 m), weight 72.576 kg (160 lb), SpO2 96 %.Body mass index is 22.33 kg/(m^2).  General Appearance: Disheveled and Guarded  Eye Contact::  Fair  Speech:  Garbled and Slurred  Volume:  Decreased  Mood:  Anxious and Depressed  Affect:  Non-Congruent, Inappropriate and Labile  Thought Process:  Disorganized, Irrelevant and Tangential  Orientation:  Negative  Thought Content:  Rumination  Suicidal Thoughts:  No  Homicidal Thoughts:  No  Memory:   NA Immediate;   Poor Recent;   Poor  Judgement:  Impaired  Insight:  Shallow  Psychomotor Activity:  Restlessness  Concentration:  Poor  Recall:  Poor  Fund of Knowledge:Poor  Language: Fair  Akathisia:  Negative  Handed:  Right  AIMS (if indicated):     Assets:  Others:  Unable to perform at this time  ADL's:  Impaired  Cognition: We be assessed at appropriate time   Sleep:      Treatment Plan Summary: Daily contact with patient to assess and evaluate symptoms and progress in treatment and Medication management  Plan:  Continue Lithium carbonate 900 mg at bedtime and serum level is 0.58, low therapeutic and not toxic Continue Olanzapine Zydis 10 mg at bedtime and Seroquel 200 mg at bedtime for agitation Continue Trazodone 200 mg at bedtime for depression and insomnia Continue Ativan 2 mg PO Q4 hours PRN for anxiety May take Clonazepam 0.5 mg PO BID - preferred by patient and family Patient does not meet criteria for psychiatric inpatient admission. Supportive therapy provided about ongoing stressors.  Appreciate psychiatric consultation Please contact [phone] or [phone] if needs further assistance   Disposition: Patient to  be reevaluated for safe disposition plans when he is able to participate in assessment   Yeva Bissette,JANARDHAHA R. 04/17/2015 11:02 AM

## 2015-04-17 NOTE — Progress Notes (Signed)
Daily Progress Note   Patient Name: [name]       Date: 04/17/2015 DOB: 15-Nov-1960  Age: 54 y.o. MRN#: [id number] Attending Physician: Janece Canterbury, MD Primary Care Physician: Vickii Penna., MD Admit Date: 04/11/2015  Reason for Consultation/Follow-up: Establishing goals of care, Non pain symptom management and Pain control  Subjective:     I met with numerous family members today including brother Legrand Como and their mother. I also met with wife Charleston Ropes and her sister separately. We all covered the same things and they are all in agreement that he has suffered a lot and continues to suffer - they all would like to see him comfortable. I believe the only way this is to occur is to plan for hospice facility recognizing his condition is not going to get better (Dr. Sheran Fava and psych agree) and I believe will continue to worsen. Discussed that with more liberal medications to ensure control and comfort from his agitation - he may be more lethargic and may eat even less - they would rather him be more comfortable and are not pursuing more testing/interventions. I do believe that prognosis may likely be < 3 weeks especially with more aggressive care focused on comfort goal. Shared this with all family. Wife also has financial concerns as well.   - May require higher doses of opioids/benzos as his tolerance may be quite high - Agree with stopping librium as I am unsure if he will be be able to accept medication consistently anyways - He will need aggressive symptom management and will be best served in hospice facility as prognosis is poor  Unfortunate circumstances with very supportive family.    Length of Stay: 5 days  Current Medications: Scheduled Meds:  . clonazePAM  0.5 mg Oral BID  . feeding supplement (ENSURE ENLIVE)  237 mL Oral BID BM  . lactulose  20 g Oral BID  . lithium carbonate  900 mg Oral QHS  . morphine CONCENTRATE  20 mg Oral TID WC & HS  . OLANZapine zydis  15 mg  Oral QHS  . OLANZapine zydis  5 mg Oral Daily  . permethrin   Topical Once  . rifaximin  550 mg Oral BID  . traZODone  200 mg Oral QHS    Continuous Infusions:    PRN Meds: clobetasol ointment, liver oil-zinc oxide, LORazepam, morphine CONCENTRATE  Palliative Performance Scale: 20-30%     Vital Signs: BP 119/75 mmHg  Pulse 120  Temp(Src) 97.7 F (36.5 C) (Tympanic)  Resp 16  Ht $R'5\' 11"'vv$  (1.803 m)  Wt 72.576 kg (160 lb)  BMI 22.33 kg/m2  SpO2 96% SpO2: SpO2: 96 % O2 Device: O2 Device: Not Delivered O2 Flow Rate:    Intake/output summary: No intake or output data in the 24 hours ending 04/17/15 1327 LBM: Last BM Date: 04/16/15 Baseline Weight: Weight: 72.576 kg (160 lb) Most recent weight: Weight: 72.576 kg (160 lb)  Physical Exam: General: Severely agitated, restless Neuro: Verbal at times but does not make sense, oriented to self only   Additional Data Reviewed: Recent Labs     04/15/15  0440  04/16/15  0525  WBC  7.4  10.2  HGB  8.6*  8.7*  PLT  170  201  NA  139  138  BUN  6  8  CREATININE  0.67  0.74     Problem List:  Patient Active Problem List   Diagnosis Date Noted  . Aspiration pneumonia 04/14/2015  .  Palliative care encounter   . Acute delirium 04/13/2015  . Femur fracture, left 04/12/2015  . Malnutrition of moderate degree 04/12/2015  . Lumbar stenosis with neurogenic claudication 04/08/2015  . Back pain 04/04/2015  . Lumbago   . Tobacco abuse   . Uncomplicated alcohol dependence   . Other pancytopenia   . Stridor 03/12/2013  . Narcotic overdose 03/12/2013  . Diarrhea 01/02/2012  . AVB (atrioventricular block) 01/02/2012  . Lithium toxicity 01/02/2012  . Benzodiazepine (tranquilizer) overdose 12/31/2011  . Hypotension due to drugs 12/31/2011  . Opioid abuse, episodic use 12/17/2011  . Bipolar 1 disorder, depressed, moderate 12/06/2011  . Generalized anxiety disorder 12/06/2011  . Alcohol dependence in remission 12/06/2011  .  Movement disorder 11/30/2011  . DRUG OVERDOSE 07/22/2007  . DISORDER, DENTAL NOS 05/21/2007  . ERECTILE DYSFUNCTION 11/25/2006  . SHOULDER PAIN, RIGHT, CHRONIC 11/25/2006  . PERIPHERAL NEUROPATHY 08/17/2006  . TACHYCARDIA, HX OF 08/17/2006  . HEPATITIS C 06/23/2006  . DISEASE, INFECTIOUS/PARASITIC NOS 06/23/2006  . Bipolar disorder 06/23/2006  . TOBACCO ABUSE 06/23/2006  . Essential hypertension 06/23/2006  . PSORIASIS 06/23/2006  . AVASCULAR NECROSIS 06/23/2006  . ALCOHOL ABUSE, HX OF 06/23/2006  . STATUS, SHOULDER JOINT REPLACEMENT 06/23/2006  . Hip joint replacement by other means 06/23/2006  . APPENDECTOMY, HX OF 06/23/2006     Palliative Care Assessment & Plan    Code Status:  DNR  Goals of Care:  Goal is comfort.   Desire for further Chaplaincy support: yes - request priest from [place]  3. Symptom Management:  Pain: Roxanol 20 mg QID (he is awake and still grimaces at times). Added benadryl prn for itching. May consider rotating opiates if itching severe - dilaudid?  Agitation: Continue Zyprexa as ordered. Klonopin 0.5 BID added today by psych. Continue lorazepam 2 mg every 4 hours prn - he seems very agitated and I would encourage nursing to utilize for comfort as needed.   Bipolar: Psych following. See above recs.   4. Palliative Prophylaxis:  Stool [place]: Receiving lactulose.   5. Prognosis: I do believe that prognosis is likely less than 3 weeks. He is still severely agitated and needs much more complicated symptom management that I believe only hospice will be able to manage.   5. Discharge Planning: Recommend hospice facility.    Thank you for allowing the Palliative Medicine Team to assist in the care of this patient.   Time In: 1215 Time Out: 1330 Total Time 75min Prolonged Time Billed  yes    Greater than 50%  of this time was spent counseling and coordinating care related to the above assessment and plan.   Vinie Sill, NP Palliative  Medicine Team Pager # [phone] (M-F 8a-5p) Team Phone # [phone] (Nights/Weekends)  04/17/2015, 1:27 PM

## 2015-04-17 NOTE — Progress Notes (Signed)
   04/17/15 1631  Clinical Encounter Type  Visited With Patient and family together;Health care provider  Visit Type Initial;Spiritual support  Referral From Family  Spiritual Encounters  Spiritual Needs Prayer   Chaplain met with family, and family is requesting a priest. Bonney Roussel will try to get one before patient moves to Hospice facility. Chaplain available as needed. Pager - [place], [place] 04/17/2015 4:33 PM

## 2015-04-17 NOTE — Care Management Important Message (Signed)
Important Message  Patient Details  Name: [name] MRN: [id number] Date of Birth: August 22, 1960   Medicare Important Message Given:  Yes-second notification given    Bernadette Hoit 04/17/2015, 12:44 PM

## 2015-04-17 NOTE — Clinical Social Work Note (Signed)
CSW received referral for residential hospice placement at time of discharge.  CSW initiated appropriate referrals for residential hospice placement.  CSW to continue to follow and assist with discharge planning needs.  [name], [place] - [phone] Clinical Social Work Department Orthopedics [phone]) and Surgical [phone])

## 2015-04-17 NOTE — Progress Notes (Signed)
[facility] PROGRESS NOTE  [name] UXN:[id number] DOB: 08-Oct-1960 DOA: 04/11/2015 PCP: Vickii Penna., MD  Brief Summary  The patient is a 54 yo M with history of alcohol abuse, supposedly in remission since 11/2002 but later reported drinking 4 beers daily and vodka intermittently, peripheral neuropathy, bipolar disorder on lithium, hepatitis C with liver cirrhosis, GERD, chronic pain, hypertension, possible TIA.  He was he was hospitalized from 04/04/2015 through 04/11/2015 secondary to acute on chronic low back pain with sciatica. His MRI demonstrated severe degenerative joint disease from L3-L4 with severe spinal stenosis and inferior endplate Schmorl's node at T12.  He was seen by neurosurgery and underwent L4-L5 decompressive lumbar laminectomy, bilateral L4-L5 facetectomy, foraminotomies with decompression beyond that required for interbody arthrodesis.  He had L4-5 posterior l umbar interbody arthrodesis with AVS peek interbody implants, Vitoss BA with bone marrow aspirate and infusion by Drs Sherwood Gambler and Vertell Limber on 9/1.  His pain improved, however, his postoperative course was complicated by delirium and agitation attributed to alcohol withdrawal.  He had pancytopenia and macrocytosis suggestive of chronic alcohol abuse.  Lithium level during previous admission was less than threshold suggesting possible noncompliance with therapy or subtherapeutic dose.  His dose was not adjusted during that admission.  He also had a low grade fever that was attributed to possible aspiration pneumonia.  At the time of discharge, his fever had resolved, he was able to carry on a conversation with the discharging physician and had been recommended for SNF for rehab.  He was discharged to [facility] where he had a fall within a few hours of arrival and he was transported back to the ER for reevaluation.  He has history of bilateral total hip replacements.  His CT lumbar spine demonstrated no evidence of lower  spine fracture or subluxation, head CT was negative, but XR hips demonstrated a left total hip arthroplasty with acute appearing fracture of the intertrochanteric/greater trochanteric region of the left hip without displacement or dislocation.  He was admitted for further evaluation by orthopedic surgery.    9/12:  Met with wife and palliative care.  Recommending inpatient hospice assessment given clinical condition.    Assessment/Plan  Left hip intertrochanteric/greater trochanter fracture with recurrent falls both in and out of hospital -  CT hip:  Nondisplaced comminuted fracture of the greater trochanter of the left femur with intact arthroplasty components -  Appreciate orthopedics assistance, no plan for imminent surgery -  Scheduled morphine with morphine for breakthrough pain  FUO, not planning further work up.  He continued to have fevers while on antibiotics for aspiration pneumonia.  Repeat CXR negative.  Repeat UA negative.  Blood cultures NGTD.   Last imaging was on 9/7 of his spine which looks intact.   -  No further antibiotics, comfort measures only  Delirium, persisting probably secondary to severe illness, prolonged hospitalization, and Korsakoff dementia, further complicated by his bipolar 1 d/o.  He has had hx of lithium toxicity which can have lasting effects.  Elevated ammonia suggested hepatic encephalopathy and he improved some with lactulose and rifaximin.   -  Continue rifaximin -  HIV NR, RPR NR, vitamin B12 wnl.   -  TSH slightly abnormal 1 week ago.   -  Medications per psychiatry -  continue scheduled narcotics  Sinus tachycardia, worsening, but not planning further work up or treatment -  Mildly elevated fT4 and normal TSH likely does not explain degree of sinus tach -  Likely related to sepsis  which we are not treating -  Could also represent PE, but no further work up  At risk for dehydration due to not eating and drinking very much.  No PIV and will not  replace.    Hypercalcemia likely due to primary hyperparathyroidism, has been going on for years, mildly elevated -  Phos wnl, PTH elevated and vitamin D levels low normal -  No further work up or treatments  Alcohol abuse/hepatitis C/cirrhosis - completed alcohol withdrawal - Last ultrasound was January 2016 which demonstrated increased echogenicity and enlargement - Elevated ammonia level  Macrocytic anemia, hemoglobin stable around 8.$RemoveBeforeD'7mg'TXbPEBsMoWsTAx$ /dl, likely related to alcohol abuse related bone marrow toxicity and cirrhosis. -  Iron studies c/w possible iron deficiency vs. Chronic disease -  Repeat TSH wnl but fT4 mildly elevated, still likely secondary to sick euthyroid since only minimally elevated -  Vitamin b12 and folate were wnl on 04/07/2015 -  Discontinue iron supplementation and no further labs  Essential hypertension with very elevated blood pressures.  Symptom management only.  D/c metoprolol and stop clonidine patch.  Psoriasis, continue steroid cream.  RUE swelling due to superficial thrombophlebitis, but not planning further work up at this time  Diet:  regular  Access:  None IVF:  off Proph:  none  Code Status:  DNR Family Communication:  Family meeting as above. Disposition Plan:   Comfort measures only.  Inpatient hospice assessment  Consultants:  [facility]  Palliative care  Psychiatry  Procedures:  CT lumbar spine  CT head  X-ray pelvis with left hip  CT left hip  Antibiotics:  Augmentin (Unasyn) 9/8 > 9/11  HPI/Subjective:  Patient states he continues to have some abdominal pain.  Ongoing delirium.  Denies SOB, chest pain, nausea.    Objective: Filed Vitals:   04/16/15 1007 04/16/15 1415 04/16/15 1949 04/17/15 0534  BP:  133/74 130/80 119/75  Pulse:  108 107 120  Temp: 100.5 F (38.1 C) 99.6 F (37.6 C) 98.4 F (36.9 C) 97.7 F (36.5 C)  TempSrc:   Oral Tympanic  Resp:  $Remo'18 16 16  'zQGJl$ Height:      Weight:      SpO2:  98%  99% 96%   No intake or output data in the 24 hours ending 04/17/15 1547 Filed Weights   04/11/15 2310  Weight: 72.576 kg (160 lb)   Body mass index is 22.33 kg/(m^2).  Exam:   General:  Adult male, No acute distress, lying in bed and able to focus briefly on conversation but still making nonsensical comments and restless, stable from yesterday  HEENT:  NCAT, MMM  Cardiovascular:  RRR, nl S1, S2 no mrg, 2+ pulses, warm extremities  Respiratory:  CTAB, no increased WOB  Abdomen:   NABS, soft, mild TTP in the LLQ and suprapubic area without rebound or guarding  MSK:   Decreased tone and bulk, no LEE, extensive bruising over the left flank and left upper leg  Neuro:  Grossly moves all extremities  Data Reviewed: Basic Metabolic Panel:  Recent Labs Lab 04/11/15 0630  04/12/15 1636 04/13/15 0610 04/14/15 0526 04/15/15 0440 04/16/15 0525  NA 140  < > 138 134* 136 139 138  K 3.9  < > 3.5 4.3 3.6 3.0* 3.1*  CL 109  < > 107 104 103 106 103  CO2 23  < > 25 21* $Remo'26 26 29  'Baupn$ GLUCOSE 95  < > 108* 108* 115* 115* 117*  BUN 12  < > 18 16 7  6 8  CREATININE 0.97  < > 0.81 0.67 0.69 0.67 0.74  CALCIUM 11.5*  < > 11.7*  11.6* 11.0* 10.7* 10.4* 10.9*  PHOS 2.8  --  2.8  --   --   --   --   < > = values in this interval not displayed. Liver Function Tests:  Recent Labs Lab 04/11/15 0630 04/12/15 1636 04/16/15 0525  AST  --  70* 60*  ALT  --  68* 48  ALKPHOS  --  96 107  BILITOT  --  1.5* 1.6*  PROT  --  7.1 6.3*  ALBUMIN 2.9* 3.1* 2.7*   No results for input(s): LIPASE, AMYLASE in the last 168 hours.  Recent Labs Lab 04/12/15 1531  AMMONIA 64*   CBC:  Recent Labs Lab 04/11/15 2347  04/13/15 0610 04/13/15 0846 04/14/15 0526 04/15/15 0440 04/16/15 0525  WBC 7.7  < > 17.4* 13.1* 9.9 7.4 10.2  NEUTROABS 6.0  --   --   --   --   --   --   HGB 9.4*  < > 10.1* 9.4* 9.1* 8.6* 8.7*  HCT 28.5*  < > 29.1* 28.5* 27.3* 25.7* 26.8*  MCV 102.9*  < > 99.3 101.1* 101.1* 101.2*  102.3*  PLT 200  < > PLATELET CLUMPS NOTED ON SMEAR, UNABLE TO ESTIMATE 304 226 170 201  < > = values in this interval not displayed.  Recent Results (from the past 240 hour(s))  Culture, blood (routine x 2)     Status: None (Preliminary result)   Collection Time: 04/13/15  8:46 AM  Result Value Ref Range Status   Specimen Description BLOOD LEFT ANTECUBITAL  Final   Special Requests BOTTLES DRAWN AEROBIC ONLY 5CC  Final   Culture NO GROWTH 4 DAYS  Final   Report Status PENDING  Incomplete  Culture, blood (routine x 2)     Status: None (Preliminary result)   Collection Time: 04/13/15  8:55 AM  Result Value Ref Range Status   Specimen Description BLOOD RIGHT ANTECUBITAL  Final   Special Requests BOTTLES DRAWN AEROBIC ONLY 5CC  Final   Culture NO GROWTH 4 DAYS  Final   Report Status PENDING  Incomplete     Studies: Dg Chest Port 1 View  04/15/2015   CLINICAL DATA:  Low-grade fever.  Altered mental status.  EXAM: PORTABLE CHEST - 1 VIEW  COMPARISON:  04/09/2015  FINDINGS: Cardiac silhouette is upper limits of normal to mildly enlarged in size. Patient has taken a slightly greater inspiration than on the prior study. No confluent airspace opacity, edema, pleural effusion, or and pneumothorax is identified. Bilateral shoulder arthroplasties are noted. Prior cervical spine fusion.  IMPRESSION: No active disease.   Electronically Signed   By: Logan Bores M.D.   On: 04/15/2015 21:41    Scheduled Meds: . clonazePAM  0.5 mg Oral BID  . feeding supplement (ENSURE ENLIVE)  237 mL Oral BID BM  . lactulose  20 g Oral BID  . lithium carbonate  900 mg Oral QHS  . morphine CONCENTRATE  20 mg Oral TID WC & HS  . OLANZapine zydis  15 mg Oral QHS  . OLANZapine zydis  5 mg Oral Daily  . permethrin   Topical Once  . rifaximin  550 mg Oral BID  . traZODone  200 mg Oral QHS   Continuous Infusions:    Principal Problem:   Bipolar disorder Active Problems:   Essential hypertension   Uncomplicated  alcohol  dependence   Femur fracture, left   Malnutrition of moderate degree   Acute delirium   Aspiration pneumonia   Palliative care encounter    Time spent: 30 min    Tyquan Carmickle, [facility] Pager [phone]. If 7PM-7AM, please contact night-coverage at [email or web address], password [facility] 04/17/2015, 3:47 PM  LOS: 5 days

## 2015-04-18 DIAGNOSIS — R509 Fever, unspecified: Secondary | ICD-10-CM

## 2015-04-18 DIAGNOSIS — R52 Pain, unspecified: Secondary | ICD-10-CM

## 2015-04-18 DIAGNOSIS — A419 Sepsis, unspecified organism: Secondary | ICD-10-CM

## 2015-04-18 LAB — CULTURE, BLOOD (ROUTINE X 2)
CULTURE: NO GROWTH
Culture: NO GROWTH

## 2015-04-18 MED ORDER — OXYCODONE HCL 20 MG/ML PO CONC: 10.0000 mg | ORAL | Status: AC | PRN

## 2015-04-18 MED ORDER — KETOROLAC TROMETHAMINE 10 MG PO TABS
10.0000 mg | ORAL_TABLET | Freq: Four times a day (QID) | ORAL | Status: DC | PRN
  Filled 2015-04-18: qty 1

## 2015-04-18 MED ORDER — LORAZEPAM 2 MG/ML PO CONC: 1.0000 mg | Freq: Three times a day (TID) | ORAL | Status: DC

## 2015-04-18 MED ORDER — CLONAZEPAM 0.5 MG PO TABS: 0.5000 mg | ORAL_TABLET | Freq: Two times a day (BID) | ORAL | Status: AC

## 2015-04-18 MED ORDER — OLANZAPINE 5 MG PO TBDP: 5.0000 mg | ORAL_TABLET | Freq: Every day | ORAL | Status: AC

## 2015-04-18 MED ORDER — OLANZAPINE 15 MG PO TBDP: 15.0000 mg | ORAL_TABLET | Freq: Every day | ORAL | Status: AC

## 2015-04-18 MED ORDER — OXYCODONE HCL 20 MG/ML PO CONC: 10.0000 mg | Freq: Three times a day (TID) | ORAL | Status: AC

## 2015-04-18 MED ORDER — DIPHENHYDRAMINE HCL 12.5 MG/5ML PO ELIX: 6.2500 mg | ORAL_SOLUTION | Freq: Four times a day (QID) | ORAL | Status: AC | PRN

## 2015-04-18 MED ORDER — KETOROLAC TROMETHAMINE 10 MG PO TABS: 10.0000 mg | ORAL_TABLET | Freq: Four times a day (QID) | ORAL | Status: AC | PRN

## 2015-04-18 MED ORDER — LORAZEPAM 1 MG PO TABS: 2.0000 mg | ORAL_TABLET | ORAL | Status: DC | PRN

## 2015-04-18 MED ORDER — OXYCODONE HCL 20 MG/ML PO CONC: 10.0000 mg | ORAL | Status: DC | PRN

## 2015-04-18 MED ORDER — LORAZEPAM 2 MG/ML PO CONC: 2.0000 mg | ORAL | Status: DC | PRN

## 2015-04-18 MED ORDER — LACTULOSE 10 GM/15ML PO SOLN: 20.0000 g | Freq: Two times a day (BID) | ORAL | Status: AC

## 2015-04-18 MED ORDER — LORAZEPAM 1 MG PO TABS
1.0000 mg | ORAL_TABLET | Freq: Three times a day (TID) | ORAL | Status: DC
  Administered 2015-04-18 (×2): 1 mg via ORAL
  Filled 2015-04-18 (×2): qty 1

## 2015-04-18 MED ORDER — LORAZEPAM 2 MG PO TABS: 2.0000 mg | ORAL_TABLET | ORAL | Status: AC | PRN

## 2015-04-18 MED ORDER — OXYCODONE HCL 20 MG/ML PO CONC
10.0000 mg | Freq: Three times a day (TID) | ORAL | Status: DC
  Administered 2015-04-18: 10 mg via ORAL
  Filled 2015-04-18: qty 1

## 2015-04-18 NOTE — Progress Notes (Signed)
Daily Progress Note   Patient Name: [name]       Date: 04/18/2015 DOB: 07-26-1961  Age: 54 y.o. MRN#: [id number] Attending Physician: Renae Fickle, MD Primary Care Physician: Arlan Organ., MD Admit Date: 04/11/2015  Reason for Consultation/Follow-up: Establishing goals of care, Non pain symptom management and Pain control  Subjective:     [name] is still very agitated this morning. Brother [name] is at bedside. I spoke with [name] who is very concerned and frustrated. He tells me that he continues to smell alcohol on [name]'s wife's breathe when she comes to visit and is concerned about her visiting as she makes [name] much more agitated. I have spoken with nursing to help reinforce a calm, peaceful environment. I will schedule medication for agitation. [name] has been coming to [employer] and says that he only ate a few bites of his meal yesterday for lunch and dinner but ate no breakfast and did not drink much at all. I did witness when he gave him a bite of breakfast this morning [name] did not know what to do but [name] reminded him to chew and he has food in his mouth - unsure how long he will be able to continue po meds. He is also running a fever this morning > 102  and I will add prn for comfort but per previous discussion we will focus on comfort and symptom management. Chaplain working to get priest to visit as well. [name] and his wife [name] at bedside tell me that his mental status, confusion, and agitation continues to worsen.    Length of Stay: 6 days  Current Medications: Scheduled Meds:  . clonazePAM  0.5 mg Oral BID  . feeding supplement (ENSURE ENLIVE)  237 mL Oral BID BM  . lactulose  20 g Oral BID  . lithium carbonate  900 mg Oral QHS  . morphine CONCENTRATE  20 mg Oral TID WC & HS  . OLANZapine zydis  15 mg Oral QHS  . OLANZapine zydis  5 mg Oral Daily  . permethrin   Topical Once  . rifaximin  550 mg Oral BID  . traZODone  200 mg Oral QHS      Continuous Infusions:    PRN Meds: clobetasol ointment, diphenhydrAMINE, liver oil-zinc oxide, LORazepam, morphine CONCENTRATE  Palliative Performance Scale: 20%     Vital Signs: BP 156/82 mmHg  Pulse 137  Temp(Src) 102.2 F (39 C) (Oral)  Resp 16  Ht  (1.803 m)  Wt 72.576 kg (160 lb)  BMI 22.33 kg/m2  SpO2 97% SpO2: SpO2: 97 % O2 Device: O2 Device: Not Delivered O2 Flow Rate:    Intake/output summary:  Intake/Output Summary (Last 24 hours) at 04/18/15 1324 Last data filed at 04/18/15 0710  Gross per 24 hour  Intake      0 ml  Output      0 ml  Net      0 ml   LBM: Last BM Date: 04/16/15 Baseline Weight: Weight: 72.576 kg (160 lb) Most recent weight: Weight: 72.576 kg (160 lb)  Physical Exam: General: Agitated, restless Neuro: Confused, responds to his name but otherwise speech is inappropriate   Additional Data Reviewed: Recent Labs     04/16/15  0525  WBC  10.2  HGB  8.7*  PLT  201  NA  138  BUN  8  CREATININE  0.74     Problem List:  Patient Active Problem List   Diagnosis Date Noted  .  Fracture of humerus greater tuberosity   . Aspiration pneumonia 04/14/2015  . Palliative care encounter   . Acute delirium 04/13/2015  . Femur fracture, left 04/12/2015  . Malnutrition of moderate degree 04/12/2015  . Lumbar stenosis with neurogenic claudication 04/08/2015  . Back pain 04/04/2015  . Lumbago   . Tobacco abuse   . Uncomplicated alcohol dependence   . Other pancytopenia   . Stridor 03/12/2013  . Narcotic overdose 03/12/2013  . Diarrhea 01/02/2012  . AVB (atrioventricular block) 01/02/2012  . Lithium toxicity 01/02/2012  . Benzodiazepine (tranquilizer) overdose 12/31/2011  . Hypotension due to drugs 12/31/2011  . Opioid abuse, episodic use 12/17/2011  . Bipolar 1 disorder, depressed, moderate 12/06/2011  . Generalized anxiety disorder 12/06/2011  . Alcohol dependence in remission 12/06/2011  . Movement disorder 11/30/2011  .  DRUG OVERDOSE 07/22/2007  . DISORDER, DENTAL NOS 05/21/2007  . ERECTILE DYSFUNCTION 11/25/2006  . SHOULDER PAIN, RIGHT, CHRONIC 11/25/2006  . PERIPHERAL NEUROPATHY 08/17/2006  . TACHYCARDIA, HX OF 08/17/2006  . HEPATITIS C 06/23/2006  . DISEASE, INFECTIOUS/PARASITIC NOS 06/23/2006  . Bipolar disorder 06/23/2006  . TOBACCO ABUSE 06/23/2006  . Essential hypertension 06/23/2006  . PSORIASIS 06/23/2006  . AVASCULAR NECROSIS 06/23/2006  . ALCOHOL ABUSE, HX OF 06/23/2006  . STATUS, SHOULDER JOINT REPLACEMENT 06/23/2006  . Hip joint replacement by other means 06/23/2006  . APPENDECTOMY, HX OF 06/23/2006     Palliative Care Assessment & Plan    Code Status:  DNR  Goals of Care:  Comfort.   3. Symptom Management:  Pain (likely from fracture/fall): Itching with roxanol so changed to oxyfast 10 mg QID scheduled. Oxyfast 10 mg every 2 hours prn.  Itching: Benadryl 6.25 every 6 hours prn.   Agitation: Continue Zyprexa as ordered. Klonopin 0.5 BID. Lorazepam 1 mg TID scheduled today and will allow 2 mg every 3 hours prn. Trazodone seems to work well for him at night as he seems to be less agitated at night per nursing.   Bipolar: Psych following. See above recs.  4. Palliative Prophylaxis:  Stool Softner: Lactulose - taking at times but unable to get him to take at other times.   5. Prognosis: < 3 weeks very likely - discussed with family yesterday  5. Discharge Planning: Hospice facility   Care plan was discussed with Dr. Malachi Bonds.   Thank you for allowing the Palliative Medicine Team to assist in the care of this patient.   Time In: 0840 Time Out: 0945 Total Time Prolonged Time Billed  yes     Greater than 50%  of this time was spent counseling and coordinating care related to the above assessment and plan.     Yong Channel, NP Palliative Medicine Team Pager # [phone] (M-F 8a-5p) Team Phone # [phone] (Nights/Weekends)  04/18/2015, 9:24 AM

## 2015-04-18 NOTE — Progress Notes (Signed)
Report was called and given to Okey Regal, receiving RN at [facility].  Awaiting transportation via ambulance to Hospice. SW has made the arrangement per our convo.

## 2015-04-18 NOTE — Clinical Social Work Note (Signed)
Patient discharged to [facility]. Family completed admissions paperwork at bedside with admissions liaison. Discharge packet completed and RN provided with report number. Transportation: EMS  Marcelline Deist, [place] - [phone] Clinical Social Work Department Orthopedics [phone]) and Surgical [phone])

## 2015-04-18 NOTE — Discharge Summary (Addendum)
Physician Discharge Summary  [name] IWP:[id number] DOB: [birth year] DOA: 04/11/2015  PCP: Vickii Penna., MD  Admit date: 04/11/2015 Discharge date:  04/18/2015  Discharge to residential hospice for ongoing symptom management  Discharge Diagnoses:  Principal Problem:   Bipolar disorder Active Problems:   Essential hypertension   Uncomplicated alcohol dependence   Femur fracture, left   Malnutrition of moderate degree   Acute delirium   Aspiration pneumonia   Palliative care encounter   Fracture of humerus greater tuberosity   Pain, generalized   FUO (fever of unknown origin)   Sepsis   Discharge Condition: fair/poor  Diet recommendation:  regular  Wt Readings from Last 3 Encounters:  04/11/15 72.576 kg (160 lb)  04/04/15 70 kg (154 lb 5.2 oz)  03/29/15 72.576 kg (160 lb)    History of present illness:   The patient is a 54 yo M with history of alcohol abuse, supposedly in remission since 11/2002 but later reported drinking 4 beers daily and vodka intermittently, peripheral neuropathy, bipolar disorder on lithium, hepatitis C with liver cirrhosis, GERD, chronic pain, hypertension, possible TIA. He was he was hospitalized from 04/04/2015 through 04/11/2015 secondary to acute on chronic low back pain with sciatica. His MRI demonstrated severe degenerative joint disease from L3-L4 with severe spinal stenosis and inferior endplate Schmorl's node at T12. He was seen by neurosurgery and underwent L4-L5 decompressive lumbar laminectomy, bilateral L4-L5 facetectomy, foraminotomies with decompression beyond that required for interbody arthrodesis. He had L4-5 posterior l umbar interbody arthrodesis with AVS peek interbody implants, Vitoss BA with bone marrow aspirate and infusion by Drs Sherwood Gambler and Vertell Limber on 9/1. His pain improved, however, his postoperative course was complicated by delirium and agitation attributed to alcohol withdrawal. He had pancytopenia and macrocytosis  suggestive of chronic alcohol abuse. Lithium level during previous admission was less than threshold suggesting possible noncompliance with therapy or subtherapeutic dose. His dose was not adjusted during that admission. He also had a low grade fever that was attributed to possible aspiration pneumonia. At the time of discharge, his fever had resolved, he was able to carry on a conversation with the discharging physician and had been recommended for SNF for rehab. He was discharged to [facility] where he had a fall within a few hours of arrival and he was transported back to the ER for reevaluation. He has history of bilateral total hip replacements. His CT lumbar spine demonstrated no evidence of lower spine fracture or subluxation, head CT was negative, but XR hips demonstrated a left total hip arthroplasty with acute appearing fracture of the intertrochanteric/greater trochanteric region of the left hip without displacement or dislocation. He was admitted for further evaluation by orthopedic surgery.   9/8:  Increased agitation.  Possible aspiration pneumonia, started antibiotics. Orthopedic surgery - no plan for operation, touch down weight bearing, psychiatry consultation for bipolar disorder  9/9: Goals of care conversation given ongoing delirium by palliative care and myself. Patient now DO NOT RESUSCITATE 9/10:  Recurrent fevers, FUO. Ongoing agitation/delirium 9/11: Family in agreement that we should pursue comfort measures and hospice care. Antibiotics stopped.  9/12: Met with wife and palliative care. Residential hospice assessment given clinical condition.   9/13:  Agitation improved, however patient not eating or drinking much and still delirious, spiked fever of 102.2, increased tachycardia   Hospital Course:   Left hip intertrochanteric/greater trochanter fracture with recurrent falls both in and out of hospital.  CT hip: Nondisplaced comminuted fracture of the greater trochanter  of  the left femur with intact arthroplasty components. Seen by orthopedic surgery who recommended touchdown weightbearing, no plans for imminent surgery.  Aspiration pneumonia initially diagnosed on 9/8.  He was started on antibiotics and blood cultures were no growth to date.  Despite being on antibiotics, he started having low grade fevers.  UA and CXR were unremarkable.  He had had recent imaging of his recent spine hardware on 9/7 which demonstrated no evidence of infection.  FUO, not planning further work up since anticipating transitioning to more comfort measures/palliative care. Antibiotics were stopped on 9/11, and now spiking fevers to 102.2 Fahrenheit and tachycardic, consistent with sepsis. No further workup or antibiotics.  Anticipate death within a few days.   Delirium, gradually improving with sedating medications. DDx includes ongoing symptoms of delirium secondary to severe illness and prolonged hospitalization, clonidine cessation, hepatic encephalopathy. 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 He has previously had elevated lithium levels and may have lingering tremor and confusion from earlier toxicity.  HIV NR, RPR NR, vitamin B12 wnl and he had a mildly abnormal TSH proximally one week prior to admission. Lithium levels were repeated the flow. Seroquel reportedly did not help him and so this was discontinued. His Zyprexa dose was gradually increased by palliative care and psychiatry. He resumed his trazodone any continue lithium. He was started on scheduled Ativan with when necessary Ativan, and scheduled oxycodone to treat possible narcotic withdrawal. He continued rifaximin and lactulose.  He continues to have agitation and delirium causing him to try to climb out of bed, however he has had some mild improvement in his symptoms over the last 1-2 days.  Minimal by mouth intake over the last several days.  Sinus tachycardia,  worsening likely secondary to sepsis which we are not treating. He had a mildly elevated fT4 and normal TSH does not explain degree of sinus tach.  Due to comfort measures, he did not undergo CT chest to evaluate for pulmonary embolism.  Hypercalcemia likely due to primary hyperparathyroidism and recent bone surgery and now hip fracture, has been going on for years. Phosphorus was within normal limits, his PTH was elevated. His vitamin D level was low normal.    Alcohol abuse/hepatitis C/cirrhosis.  Patient continued to drink up to 4 beers daily and vodka.  He was well outside the window for alcohol withdrawal during the time of this admission. Last liver ultrasound was January 2016 which demonstrated increased echogenicity and enlargement.  He had a mildly elevated ammonia level, and was given rifaximin and lactulose.  Macrocytic anemia, hemoglobin continuing to drift down, was likely related to ongoing alcohol abuse with bone marrow toxicity. His iron studies demonstrate a possible iron deficiency versus chronic disease and he was started initially on daily iron supplementation. This was discontinued after we transitioned to comfort measures only. He had a mild elevated free T4 with normal TSH, probably secondary to severe illness. Folate and vitamin B12 levels were within normal limits on 04/07/2015.  Essential hypertension with very elevated blood pressures. He was initially given a clonidine patch metoprolol, however these were discontinued. Minimizing vital signs.  Bipolar disorder, continued lithium, zyprexa. Psychiatry was consulted.  Psoriasis, patient has extensive skin psoriatic lesions on his back, limbs. Outpatient follow-up.  RUE superficial thrombophlebitis, resolved.     Consultants:  [facility]  Palliative care  Psychiatry  Procedures:  CT lumbar spine  CT head  X-ray pelvis with left hip  CT left  hip  Antibiotics:  Augmentin (Unasyn) 9/8 >  9/11  Discharge Exam: Filed Vitals:   04/18/15 0709  BP: 156/82  Pulse: 137  Temp: 102.2 F (39 C)  Resp: 16   Filed Vitals:   04/16/15 1949 04/17/15 0534 04/17/15 2325 04/18/15 0709  BP: 130/80 119/75 133/66 156/82  Pulse: 107 120 127 137  Temp: 98.4 F (36.9 C) 97.7 F (36.5 C) 98.7 F (37.1 C) 102.2 F (39 C)  TempSrc: Oral Tympanic Oral Oral  Resp: $Remo'16 16 16 16  'xVgqJ$ Height:      Weight:      SpO2: 99% 96% 96% 97%     General: Adult male, restless lying naked in bed, making nonsensical comments and not able to follow commands, ill appearing  HEENT: NCAT, mildly dry MM, sunken eyes  Cardiovascular: tachycardic RR, nl S1, S2 no mrg, 2+ pulses, warm extremities  Respiratory: CTAB, no increased WOB  Abdomen: NABS, soft, mild TTP in the LLQ and suprapubic area without rebound or guarding  MSK: Decreased tone and bulk, no LEE, extensive bruising over the left flank and left upper leg.  Some mild erythema and swelling of the dorsum of the left hand.    Neuro: Grossly moves all extremities  Discharge Instructions      Discharge Instructions    Call MD for:  difficulty breathing, headache or visual disturbances    Complete by:  As directed      Call MD for:  hives    Complete by:  As directed      Call MD for:  persistant nausea and vomiting    Complete by:  As directed      Call MD for:  severe uncontrolled pain    Complete by:  As directed      Call MD for:  temperature >100.4    Complete by:  As directed      Diet general    Complete by:  As directed      Increase activity slowly    Complete by:  As directed             Medication List    STOP taking these medications        amLODipine 5 MG tablet  Commonly known as:  NORVASC     amoxicillin-clavulanate 875-125 MG per tablet  Commonly known as:  AUGMENTIN     aspirin 81 MG tablet     augmented betamethasone dipropionate 0.05 % ointment  Commonly known as:  DIPROLENE-AF     cloNIDine 0.3  MG tablet  Commonly known as:  CATAPRES     fluticasone 0.005 % ointment  Commonly known as:  CUTIVATE     OLANZapine 10 MG tablet  Commonly known as:  ZYPREXA  Replaced by:  olanzapine zydis 15 MG disintegrating tablet     omeprazole 20 MG capsule  Commonly known as:  PRILOSEC     oxyCODONE 5 MG immediate release tablet  Commonly known as:  Oxy IR/ROXICODONE  Replaced by:  oxyCODONE 20 MG/ML concentrated solution     oxyCODONE-acetaminophen 5-325 MG per tablet  Commonly known as:  PERCOCET/ROXICET     sertraline 100 MG tablet  Commonly known as:  ZOLOFT      TAKE these medications        acetaminophen 325 MG tablet  Commonly known as:  TYLENOL  Take 2 tablets (650 mg total) by mouth every 4 (four) hours as needed.     clobetasol ointment 0.05 %  Commonly known as:  TEMOVATE  Apply 1 application topically daily as needed. Apply affected areas for psoriasis     clonazePAM 0.5 MG tablet  Commonly known as:  KLONOPIN  Take 1 tablet (0.5 mg total) by mouth 2 (two) times daily.     diphenhydrAMINE 12.5 MG/5ML elixir  Commonly known as:  BENADRYL  Take 2.5 mLs (6.25 mg total) by mouth every 6 (six) hours as needed for itching.     ketorolac 10 MG tablet  Commonly known as:  TORADOL  Take 1 tablet (10 mg total) by mouth every 6 (six) hours as needed (fever > 99.9).     lactulose 10 GM/15ML solution  Commonly known as:  CHRONULAC  Take 30 mLs (20 g total) by mouth 2 (two) times daily.     lithium carbonate 300 MG capsule  Take 900 mg by mouth at bedtime.     LORazepam 2 MG tablet  Commonly known as:  ATIVAN  Take 1 tablet (2 mg total) by mouth every 3 (three) hours as needed for anxiety.     olanzapine zydis 15 MG disintegrating tablet  Commonly known as:  ZYPREXA  Take 1 tablet (15 mg total) by mouth at bedtime.     OLANZapine zydis 5 MG disintegrating tablet  Commonly known as:  ZYPREXA  Take 1 tablet (5 mg total) by mouth daily.     oxyCODONE 20 MG/ML  concentrated solution  Commonly known as:  ROXICODONE INTENSOL  Take 0.5 mLs (10 mg total) by mouth 4 (four) times daily -  before meals and at bedtime.     oxyCODONE 20 MG/ML concentrated solution  Commonly known as:  ROXICODONE INTENSOL  Take 0.5 mLs (10 mg total) by mouth every 2 (two) hours as needed for severe pain or breakthrough pain.     traZODone 100 MG tablet  Commonly known as:  DESYREL  Take 200 mg by mouth at bedtime.       Follow-up Information    Follow up with MURPHY, TIMOTHY D, MD In 2 weeks.   Specialty:  Orthopedic Surgery   Contact information:   [address] [place] [address] [phone]       Follow up with MANNING, Drue Stager., MD.   Specialty:  Family Medicine   Contact information:   [address] [place] [address] [phone]        The results of significant diagnostics from this hospitalization (including imaging, microbiology, ancillary and laboratory) are listed below for reference.    Significant Diagnostic Studies: Dg Chest 2 View  04/08/2015   CLINICAL DATA:  Fever.  EXAM: CHEST  2 VIEW  COMPARISON:  04/03/2015.  FINDINGS: The cardiac silhouette remains borderline enlarged. Clear lungs. Bilateral shoulder prostheses.  IMPRESSION: No acute abnormality.   Electronically Signed   By: Claudie Revering M.D.   On: 04/08/2015 15:44   Dg Chest 2 View  04/04/2015   CLINICAL DATA:  Status post fall from chair, with weakness and shortness of breath. Initial encounter.  EXAM: CHEST  2 VIEW  COMPARISON:  Chest radiograph performed 03/12/2013  FINDINGS: The lungs are well-aerated and clear. There is no evidence of focal opacification, pleural effusion or pneumothorax.  The heart is borderline normal in size. No acute osseous abnormalities are seen. Bilateral shoulder arthroplasties are noted. Cervical spinal fusion hardware is partially imaged.  IMPRESSION: No acute cardiopulmonary process seen. No displaced rib fractures  identified.   Electronically Signed   By:  Garald Balding M.D.   On: 04/04/2015 00:08   Dg Cervical Spine With Flex & Extend  04/04/2015   CLINICAL DATA:  Multiple back surgeries with recent fall 04/03/2015. Patient now complains of neck and shoulder pain as well as low back pain.  EXAM: CERVICAL SPINE COMPLETE WITH FLEXION AND EXTENSION VIEWS  COMPARISON:  02/16/2013  FINDINGS: Exam demonstrates mild spondylosis throughout the cervical spine. Anterior fusion hardware is present and intact from C2-C6. Facet arthropathy and uncovertebral spurring are present. Prevertebral soft tissues are within normal. Flexion and extension views are within normal without evidence of instability. Atlantoaxial articulation is within normal. Moderate left-sided neural foraminal narrowing at the C5-6 and C6-7 levels with mild right-sided neural foraminal narrowing at the C4-5 and C5-6 levels.  IMPRESSION: Mild spondylosis of the cervical spine with bilateral neural foraminal narrowing as described.  Anterior fusion hardware from C2-C6 intact. No instability on flexion and extension.   Electronically Signed   By: Marin Olp M.D.   On: 04/04/2015 11:53   Dg Lumbar Spine 2-3 Views  04/06/2015   CLINICAL DATA:  Intraoperative fluoroscopy for posterior lumbar interbody fusion at L4-5 with hardware removal.  EXAM: LUMBAR SPINE - 2-3 VIEW; DG C-ARM 61-120 MIN  COMPARISON:  04/06/2015 portable cross-table intraoperative lumbar spine radiograph.  FINDINGS: Two nondiagnostic intraoperative spot fluoroscopic images of the lumbar spine were provided. Fluoroscopy time 27 seconds. Bone cage markers are present in the L4-5 disc space. Bilateral posterior approach pedicle screws are noted at L4 and L5.  IMPRESSION: Intraoperative fluoroscopy for posterior lumbar interbody fusion at L4-5.   Electronically Signed   By: Ilona Sorrel M.D.   On: 04/06/2015 12:53   Dg Lumbar Spine Complete  03/29/2015   CLINICAL DATA:  Bilateral leg pain.  Recent  bilateral leg weakness.  EXAM: LUMBAR SPINE - COMPLETE 4+ VIEW  COMPARISON:  None.  FINDINGS: Posterior fusion changes at L4-5. Solid bony fusion across the disc space. No hardware complicating feature. No malalignment or fracture.  IMPRESSION: Postoperative changes in the lower lumbar spine. No acute bony abnormality.   Electronically Signed   By: Rolm Baptise M.D.   On: 03/29/2015 10:09   Ct Head Wo Contrast  04/12/2015   CLINICAL DATA:  54 year old male with fall  EXAM: CT HEAD WITHOUT CONTRAST  TECHNIQUE: Contiguous axial images were obtained from the base of the skull through the vertex without intravenous contrast.  COMPARISON:  Head CT dated 03/12/2013  FINDINGS: There is slight prominence of the sulci compatible with age-related volume loss. Minimal periventricular and deep white matter hypodensities represent chronic microvascular ischemic changes. There is no intracranial hemorrhage. No mass effect or midline shift identified.  The visualized paranasal sinuses and mastoid air cells are well aerated. The calvarium is intact.  IMPRESSION: No acute intracranial pathology.   Electronically Signed   By: Anner Crete M.D.   On: 04/12/2015 00:40   Ct Lumbar Spine Wo Contrast  04/12/2015   CLINICAL DATA:  54 year old male status post fall. History of back surgery last week.  EXAM: CT LUMBAR SPINE WITHOUT CONTRAST  TECHNIQUE: Multidetector CT imaging of the lumbar spine was performed without intravenous contrast administration. Multiplanar CT image reconstructions were also generated.  COMPARISON:  Radiograph dated 04/09/2015 and lumbar MRI dated 03/29/2015  FINDINGS: No acute fracture or subluxation of the lumbar spine. There are postsurgical changes of L3-L4 laminectomy and posterior fixation hardware. Small pockets of air noted at the operative bed. No definite drainable fluid collection identified.  Evaluation of this area is limited in the absence of contrast as well as due to streak artifact caused by  metallic screws. There is evidence of prior L4-5 fusion.  Punctate nonobstructing right renal calculi. No hydronephrosis. Aortoiliac atherosclerotic disease.  IMPRESSION: Postoperative changes of lower lumbar spine. No acute fracture or subluxation.   Electronically Signed   By: Anner Crete M.D.   On: 04/12/2015 00:47   Mr Lumbar Spine Wo Contrast  03/29/2015   CLINICAL DATA:  Low back pain and bilateral leg pain and burning, month duration. Previous fusion surgery.  EXAM: MRI LUMBAR SPINE WITHOUT CONTRAST  TECHNIQUE: Multiplanar, multisequence MR imaging of the lumbar spine was performed. No intravenous contrast was administered.  COMPARISON:  Radiography same day.  FINDINGS: T11-12: Normal.  T12-L1: Inferior endplate Schmorl's node at T12 with regional edema. This could be associated with back pain. Minimal bulging of the disc. No apparent neural compression.  L1-2:  Mild desiccation of the disc.  No stenosis.  L2-3:  Normal interspace.  L3-4: Disc degeneration with circumferential protrusion of disc material. Facet degeneration with facet and ligamentous hypertrophy. Severe spinal stenosis at this level that could be symptomatic.  L4-5: Distant decompression and fusion. Wide patency of the canal and foramina.  L5-S1:  No disc pathology.  Mild facet degeneration.  No stenosis.  IMPRESSION: Previous decompression and fusion at L4-5.  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  Inferior endplate Schmorl's node at T12 with associated marrow edema. This could be a cause of back pain.   Electronically Signed   By: Nelson Chimes M.D.   On: 03/29/2015 15:15   Dg Lumbar Spine 1 View  04/06/2015   CLINICAL DATA:  Posterior fusion  EXAM: LUMBAR SPINE - 1 VIEW  COMPARISON:  MR lumbar spine of 03/29/2015  FINDINGS: A cross-table lateral portable view of the lumbar spine from the operating room shows hardware for fusion at L4-5. Alignment of the lumbar  vertebrae is normal. A needle is positioned beneath the spinous process of L5 directed toward the L5-S1 interspace.  IMPRESSION: Needle directed to L5-S1 interspace. Hardware is present for posterior fusion at L4-5.   Electronically Signed   By: Ivar Drape M.D.   On: 04/06/2015 12:18   Ct Hip Left Wo Contrast  04/12/2015   CLINICAL DATA:  54 year old male with left hip pain. Evaluate for fracture.  EXAM: CT OF THE LEFT HIP WITHOUT CONTRAST  TECHNIQUE: Multidetector CT imaging of the left hip was performed according to the standard protocol. Multiplanar CT image reconstructions were also generated.  COMPARISON:  Radiograph dated 04/12/2015  FINDINGS: There is a total left hip arthroplasty. There is comminuted fracture of the greater trochanter of the left femur. The femoral component of the arthroplasty remains in anatomic alignment with the acetabular cup without evidence of dislocation. No significant intramuscular hematoma identified. Evaluation however is limited due to streak artifact caused by italic arthroplasty.  Dense stool noted within the rectum compatible with fecal impaction. There scattered sigmoid diverticula without active inflammation. Aortoiliac atherosclerotic disease noted.  IMPRESSION: Nondisplaced comminuted fracture of the greater trochanter of the left femur. The femoral and acetabular components of the arthroplasty remain in anatomic alignment.   Electronically Signed   By: Anner Crete M.D.   On: 04/12/2015 18:54   Dg Chest Port 1 View  04/15/2015   CLINICAL DATA:  Low-grade fever.  Altered mental status.  EXAM: PORTABLE CHEST - 1 VIEW  COMPARISON:  04/09/2015  FINDINGS: Cardiac silhouette is upper limits of normal to mildly enlarged in size. Patient has taken a slightly greater inspiration than on the prior study. No confluent airspace opacity, edema, pleural effusion, or and pneumothorax is identified. Bilateral shoulder arthroplasties are noted. Prior cervical spine fusion.   IMPRESSION: No active disease.   Electronically Signed   By: Logan Bores M.D.   On: 04/15/2015 21:41   Dg Chest Port 1 View  04/09/2015   CLINICAL DATA:  Fever. Status post back surgery 3 weeks ago. Cough. Vomited this morning.  EXAM: PORTABLE CHEST - 1 VIEW  COMPARISON:  Yesterday.  FINDINGS: The cardiac silhouette remains borderline enlarged. Clear lungs. Bilateral shoulder prostheses are again noted.  IMPRESSION: No acute abnormality.   Electronically Signed   By: Claudie Revering M.D.   On: 04/09/2015 09:55   Dg Abd Portable 1v  04/09/2015   CLINICAL DATA:  54 year old male with vomiting and without bowel movement.  EXAM: PORTABLE ABDOMEN - 1 VIEW  COMPARISON:  None.  FINDINGS: Evaluation of this exam is limited due to respiratory motion artifact. There is mild gaseous distention of the stomach. Moderate stool and air are noted throughout the colon. There is no evidence of bowel dilatation. There is degenerative changes of the spine. L4-L5 fixation screws noted. No acute fracture.  IMPRESSION: No evidence of bowel obstruction.   Electronically Signed   By: Anner Crete M.D.   On: 04/09/2015 19:58   Dg C-arm 1-60 Min  04/06/2015   CLINICAL DATA:  Intraoperative fluoroscopy for posterior lumbar interbody fusion at L4-5 with hardware removal.  EXAM: LUMBAR SPINE - 2-3 VIEW; DG C-ARM 61-120 MIN  COMPARISON:  04/06/2015 portable cross-table intraoperative lumbar spine radiograph.  FINDINGS: Two nondiagnostic intraoperative spot fluoroscopic images of the lumbar spine were provided. Fluoroscopy time 27 seconds. Bone cage markers are present in the L4-5 disc space. Bilateral posterior approach pedicle screws are noted at L4 and L5.  IMPRESSION: Intraoperative fluoroscopy for posterior lumbar interbody fusion at L4-5.   Electronically Signed   By: Ilona Sorrel M.D.   On: 04/06/2015 12:53   Dg Hip Unilat With Pelvis 2-3 Views Left  04/12/2015   CLINICAL DATA:  [place] to the ground tonight. Unwitnessed fall. Left  hip pain and confusion. Soft tissue bruising.  EXAM: DG HIP (WITH OR WITHOUT PELVIS) 2-3V LEFT  COMPARISON:  None.  FINDINGS: Bilateral total hip arthroplasties using non cemented components. The left hip demonstrates a periprosthetic fracture involving the inter trochanteric region and greater trochanteric region. No significant displacement of fracture fragments. No dislocation of the hip arthroplasty.  Visualized portion of right hip arthroplasty appears intact. Heterotopic ossification over the greater trochanter. Pelvis appears intact. Postoperative changes in the lower lumbar spine.  IMPRESSION: Left total hip arthroplasty with acute appearing fracture of the inter trochanteric/greater trochanteric region of the left hip. No displacement or dislocation.   Electronically Signed   By: Lucienne Capers M.D.   On: 04/12/2015 01:04    Microbiology: Recent Results (from the past 240 hour(s))  Culture, blood (routine x 2)     Status: None (Preliminary result)   Collection Time: 04/13/15  8:46 AM  Result Value Ref Range Status   Specimen Description BLOOD LEFT ANTECUBITAL  Final   Special Requests BOTTLES DRAWN AEROBIC ONLY 5CC  Final   Culture NO GROWTH 4 DAYS  Final   Report Status PENDING  Incomplete  Culture, blood (routine x 2)     Status:  None (Preliminary result)   Collection Time: 04/13/15  8:55 AM  Result Value Ref Range Status   Specimen Description BLOOD RIGHT ANTECUBITAL  Final   Special Requests BOTTLES DRAWN AEROBIC ONLY 5CC  Final   Culture NO GROWTH 4 DAYS  Final   Report Status PENDING  Incomplete     Labs: Basic Metabolic Panel:  Recent Labs Lab 04/12/15 1636 04/13/15 0610 04/14/15 0526 04/15/15 0440 04/16/15 0525  NA 138 134* 136 139 138  K 3.5 4.3 3.6 3.0* 3.1*  CL 107 104 103 106 103  CO2 25 21* $Remo'26 26 29  'wBhwj$ GLUCOSE 108* 108* 115* 115* 117*  BUN $Re'18 16 7 6 8  'dAB$ CREATININE 0.81 0.67 0.69 0.67 0.74  CALCIUM 11.7*  11.6* 11.0* 10.7* 10.4* 10.9*  PHOS 2.8  --   --    --   --    Liver Function Tests:  Recent Labs Lab 04/12/15 1636 04/16/15 0525  AST 70* 60*  ALT 68* 48  ALKPHOS 96 107  BILITOT 1.5* 1.6*  PROT 7.1 6.3*  ALBUMIN 3.1* 2.7*   No results for input(s): LIPASE, AMYLASE in the last 168 hours.  Recent Labs Lab 04/12/15 1531  AMMONIA 64*   CBC:  Recent Labs Lab 04/11/15 2347  04/13/15 0610 04/13/15 0846 04/14/15 0526 04/15/15 0440 04/16/15 0525  WBC 7.7  < > 17.4* 13.1* 9.9 7.4 10.2  NEUTROABS 6.0  --   --   --   --   --   --   HGB 9.4*  < > 10.1* 9.4* 9.1* 8.6* 8.7*  HCT 28.5*  < > 29.1* 28.5* 27.3* 25.7* 26.8*  MCV 102.9*  < > 99.3 101.1* 101.1* 101.2* 102.3*  PLT 200  < > PLATELET CLUMPS NOTED ON SMEAR, UNABLE TO ESTIMATE 304 226 170 201  < > = values in this interval not displayed. Cardiac Enzymes: No results for input(s): CKTOTAL, CKMB, CKMBINDEX, TROPONINI in the last 168 hours. BNP: BNP (last 3 results) No results for input(s): BNP in the last 8760 hours.  ProBNP (last 3 results) No results for input(s): PROBNP in the last 8760 hours.  CBG:  Recent Labs Lab 04/13/15 2123 04/14/15 0628 04/14/15 2243 04/15/15 0611  GLUCAP 150* 117* 146* 103*    Time coordinating discharge: 35 minutes  Signed:  Omaree Fuqua  [facility] 04/18/2015, 12:39 PM

## 2015-04-20 ENCOUNTER — Ambulatory Visit: Payer: Medicare Other | Admitting: Neurology

## 2015-05-06 DEATH — deceased

## 2015-05-10 NOTE — ED Provider Notes (Signed)
CSN: [id number]     Arrival date & time 03/29/15  0840 History   First MD Initiated Contact with Patient 03/29/15 [phone]     Chief Complaint  Patient presents with  . Leg Pain    pt states it feel like scatic pain; feet burning x1 month.      (Consider location/radiation/quality/duration/timing/severity/associated sxs/prior Treatment) HPI..... Bilateral leg pain and leg weakness that has been ongoing for unknown length of time.  Symptoms are greater in the right leg.  No bowel or bladder incontinence. He states this pain feels like sciatica describes a burning sensation. No fever, chills, chest pain, dyspnea, dysuria.  Past Medical History  Diagnosis Date  . Tachycardia   . Peripheral neuropathy   . Alcohol abuse     hx of quit 4/04  . Bilateral external ear infections 2007  . Tobacco abuse   . Bipolar affective disorder     Followed by Dr. [name]  . Avascular necrosis     SP right and left hip replacements and L shoulder arthroplasty, secondary to steroid use.  Has been on embrel in past.  . Psoriasis     Previously on embrel, stopped because of price.   . Hypertension   . Hepatitis C   . ARF (acute renal failure) 2008    Multifactorial in setting of over use of ibuprofen and HCTZ./ACEI  . Cirrhosis   . Back pain   . Pneumonia     hx  . Heart murmur   . Stroke 11    ? "mini stroke"  . GERD (gastroesophageal reflux disease)     occ  . Arthritis   . Pharyngeal edema 03/12/2013  . Alcohol abuse   . Benzodiazepine abuse   . Carotid atherosclerosis   . Pectus excavatum    Past Surgical History  Procedure Laterality Date  . Right total hip replacement  2004    Due to AVN  . Left total hip replacement  2001    Due to AVN  . Closed manipulation, pinning, application of a traction pin right  2002  . Right shoulder arthroplasty      Due to AVN  . Appendectomy  1989  . Joint replacement Left 02    shoulder  . Tonsillectomy    . Hernia repair    . Anterior fusion  cervical spine  03/10/2013  . Anterior cervical decomp/discectomy fusion N/A 03/10/2013    Procedure: Cervical Two-Three, Three to Four, Cervical Four to Five, Cervical Five to Six anterior cervical decompression with fusion plating and bonegraft;  Surgeon: Hewitt Shorts, MD;  Location: [facility];  Service: Neurosurgery;  Laterality: N/A;  ANTERIOR CERVICAL DECOMPRESSION/DISCECTOMY FUSION 4 LEVELS  . Esophagogastroduodenoscopy N/A 04/11/2014    Procedure: ESOPHAGOGASTRODUODENOSCOPY (EGD);  Surgeon: Graylin Shiver, MD;  Location: [facility];  Service: Endoscopy;  Laterality: N/A;  . Esophagogastroduodenoscopy N/A 04/11/2014    Procedure: ESOPHAGOGASTRODUODENOSCOPY (EGD);  Surgeon: Graylin Shiver, MD;  Location: [facility];  Service: Endoscopy;  Laterality: N/A;   Family History  Problem Relation Age of Onset  . Hypertension Mother   . Bipolar disorder Father   . Obesity Father   . Obesity Brother    Social History  Substance Use Topics  . Smoking status: Current Every Day Smoker -- 1.00 packs/day for 35 years    Types: Cigarettes  . Smokeless tobacco: Never Used     Comment: Not interested in stopping.      i want to quit togeather with  my wife "  . Alcohol Use: No     Comment: Previously alcoholic, denies ETOH for "past few weeks"    Review of Systems  All other systems reviewed and are negative.     Allergies  Prochlorperazine maleate; Erythromycin; Compazine; and Hydromorphone hcl  Home Medications   Prior to Admission medications   Medication Sig Start Date End Date Taking? Authorizing Provider  clobetasol ointment (TEMOVATE) 0.05 % Apply 1 application topically daily as needed. Apply affected areas for psoriasis   Yes Historical Provider, MD  lithium carbonate 300 MG capsule Take 900 mg by mouth at bedtime.    Yes Historical Provider, MD  traZODone (DESYREL) 100 MG tablet Take 200 mg by mouth at bedtime.   Yes Historical Provider, MD  acetaminophen (TYLENOL) 325 MG  tablet Take 2 tablets (650 mg total) by mouth every 4 (four) hours as needed. Patient taking differently: Take 650 mg by mouth every 4 (four) hours as needed.  03/16/13   Tora Kindred York, PA-C  clonazePAM (KLONOPIN) 0.5 MG tablet Take 1 tablet (0.5 mg total) by mouth 2 (two) times daily. 04/18/15   Renae Fickle, MD  diphenhydrAMINE (BENADRYL) 12.5 MG/5ML elixir Take 2.5 mLs (6.25 mg total) by mouth every 6 (six) hours as needed for itching. 04/18/15   Renae Fickle, MD  ketorolac (TORADOL) 10 MG tablet Take 1 tablet (10 mg total) by mouth every 6 (six) hours as needed (fever > 99.9). 04/18/15   Renae Fickle, MD  lactulose (CHRONULAC) 10 GM/15ML solution Take 30 mLs (20 g total) by mouth 2 (two) times daily. 04/18/15   Renae Fickle, MD  LORazepam (ATIVAN) 2 MG tablet Take 1 tablet (2 mg total) by mouth every 3 (three) hours as needed for anxiety. 04/18/15   Renae Fickle, MD  OLANZapine zydis (ZYPREXA) 15 MG disintegrating tablet Take 1 tablet (15 mg total) by mouth at bedtime. 04/18/15   Renae Fickle, MD  OLANZapine zydis (ZYPREXA) 5 MG disintegrating tablet Take 1 tablet (5 mg total) by mouth daily. 04/18/15   Renae Fickle, MD  oxyCODONE (ROXICODONE INTENSOL) 20 MG/ML concentrated solution Take 0.5 mLs (10 mg total) by mouth 4 (four) times daily -  before meals and at bedtime. 04/18/15   Renae Fickle, MD  oxyCODONE (ROXICODONE INTENSOL) 20 MG/ML concentrated solution Take 0.5 mLs (10 mg total) by mouth every 2 (two) hours as needed for severe pain or breakthrough pain. 04/18/15   Renae Fickle, MD   BP 139/90 mmHg  Pulse 79  Temp(Src) 98 F (36.7 C) (Oral)  Resp 16  SpO2 96% Physical Exam  Constitutional: He is oriented to person, place, and time. He appears well-developed and well-nourished.  HENT:  Head: Normocephalic and atraumatic.  Eyes: Conjunctivae and EOM are normal. Pupils are equal, round, and reactive to light.  Neck: Normal range of motion. Neck supple.   Cardiovascular: Normal rate and regular rhythm.   Pulmonary/Chest: Effort normal and breath sounds normal.  Abdominal: Soft. Bowel sounds are normal.  Musculoskeletal:  Minimal tenderness lower back. Pain with straight leg raise right greater than left.  Neurological: He is alert and oriented to person, place, and time.  Skin: Skin is warm and dry.  Psychiatric: He has a normal mood and affect. His behavior is normal.  Nursing note and vitals reviewed.   ED Course  Procedures (including critical care time) Labs Review Labs Reviewed - No data to display  Imaging Review No results found. I have personally reviewed and evaluated these images  and lab results as part of my medical decision-making.   EKG Interpretation None      MDM   Final diagnoses:  Low back pain    We'll obtain MRI of lower spine. Discussed with Dr. Juleen China who will follow-up with x-ray results.    Donnetta Hutching, MD 05/10/15 [phone]

## 2015-06-06 DEATH — deceased

## 2016-06-08 IMAGING — CT CT L SPINE W/O CM
2 of 5 series · 11 of 33 positions shown, 14 images · non-contrast
Comparison: Radiograph dated 04/09/2015 and lumbar MRI dated
03/29/2015

CLINICAL DATA: 54-year-old male status post fall. History of back
surgery last week.

EXAM:
CT LUMBAR SPINE WITHOUT CONTRAST
TECHNIQUE: Multidetector CT imaging of the lumbar spine was performed without
intravenous contrast administration. Multiplanar CT image
reconstructions were also generated.

[Series 3: l spine 2.0 i30s 3 · axial · 0.31mm/px · z∈[-750,-514]mm · 6 of 154 slices shown, 8 images]
[im 24/154  soft-tissue]
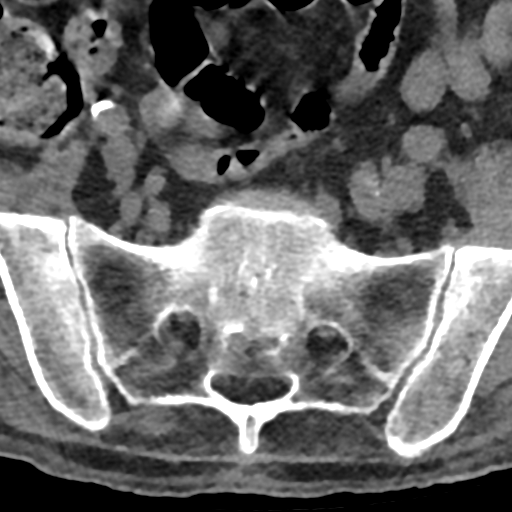
[im 24/154  bone]
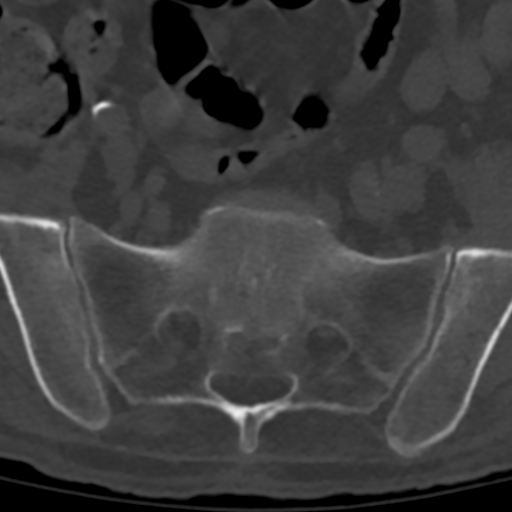
[im 48/154  bone]
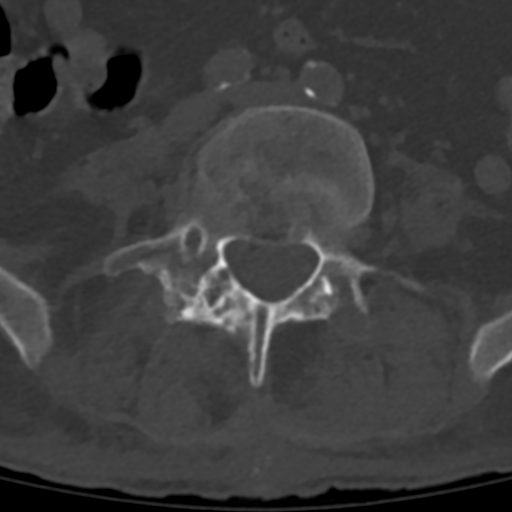
[im 71/154  bone]
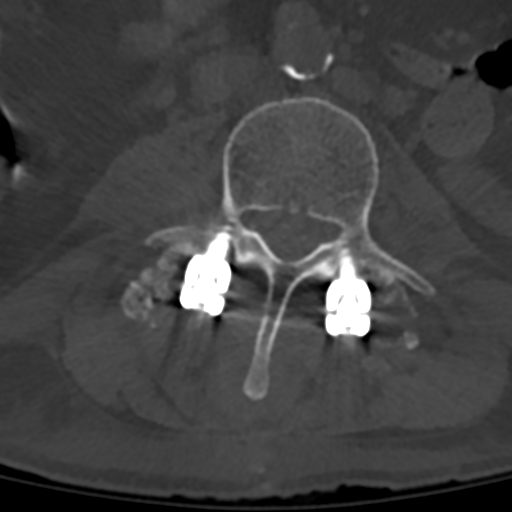
[im 95/154  bone]
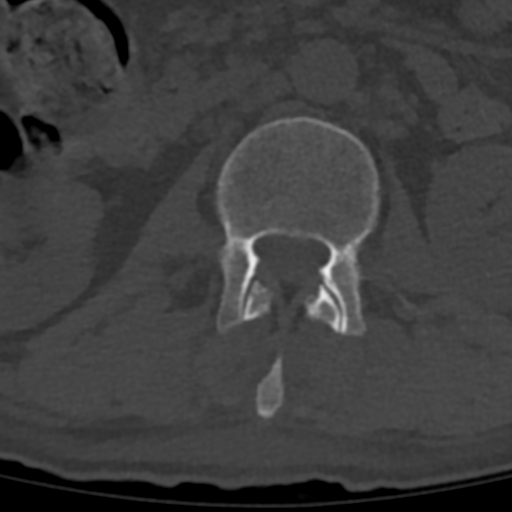
[im 118/154  soft-tissue]
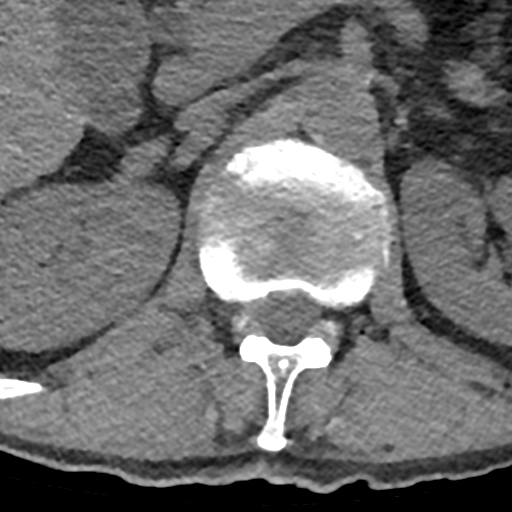
[im 118/154  bone]
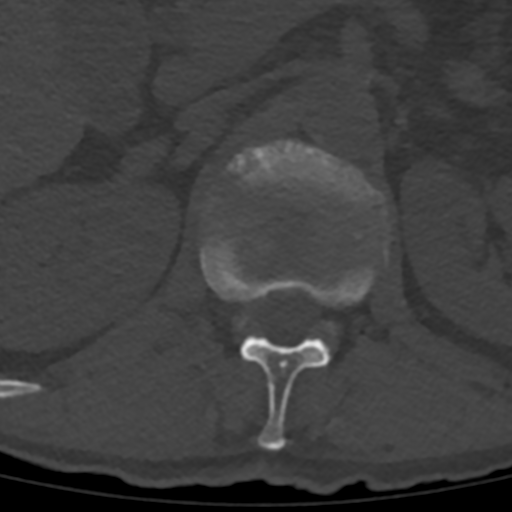
[im 142/154  bone]
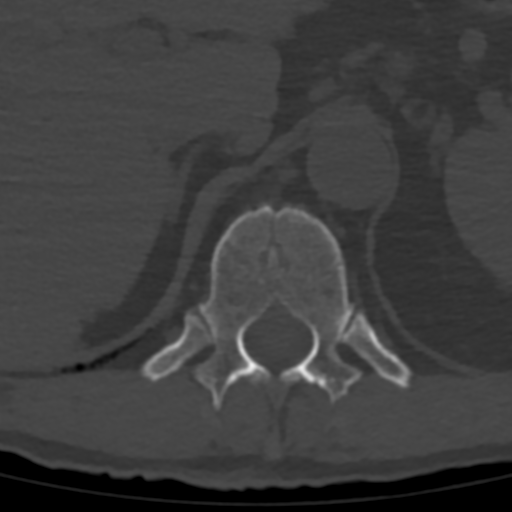

[Series 7: sagittal st · sagittal · 0.30mm/px · 5 of 61 slices shown, 6 images]
[im 21/61  bone]
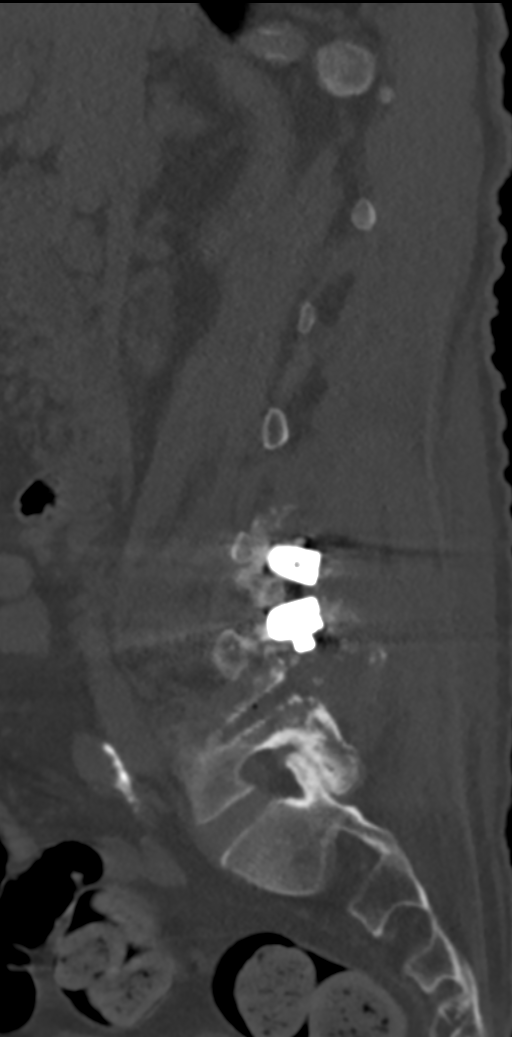
[im 26/61  bone]
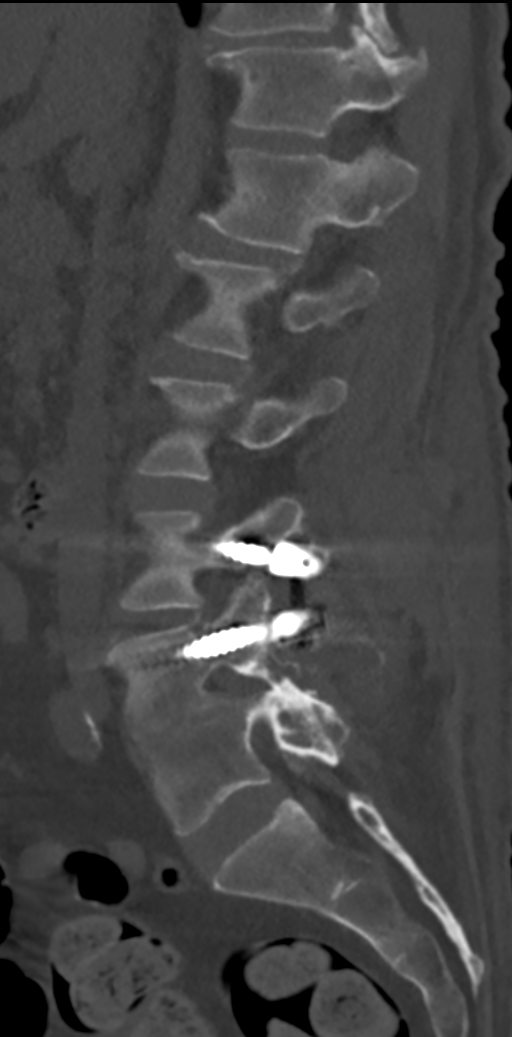
[im 31/61  soft-tissue]
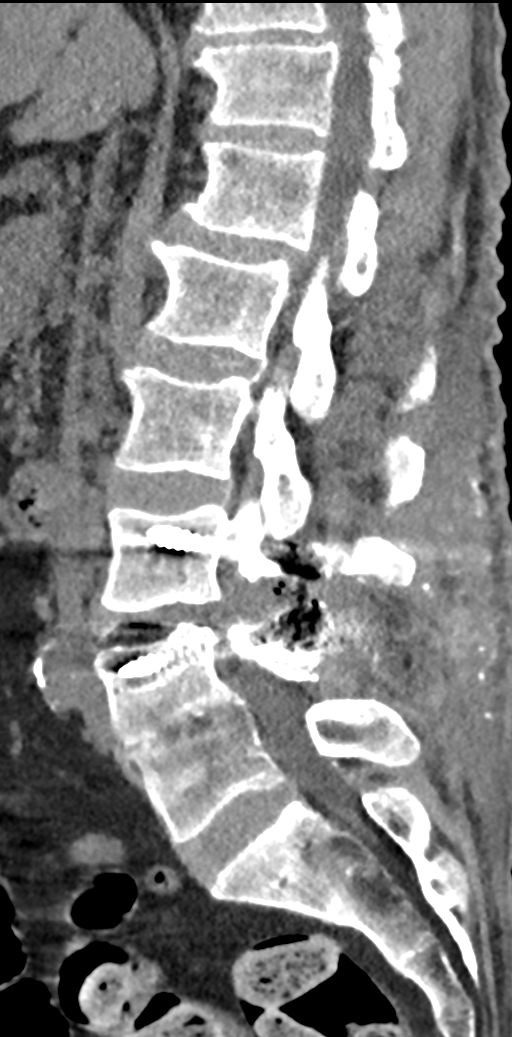
[im 31/61  bone]
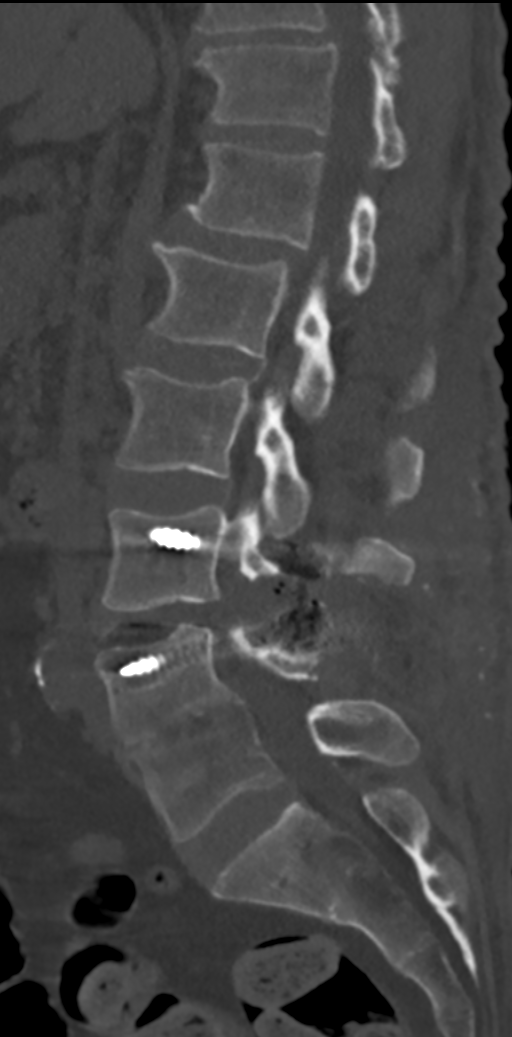
[im 36/61  bone]
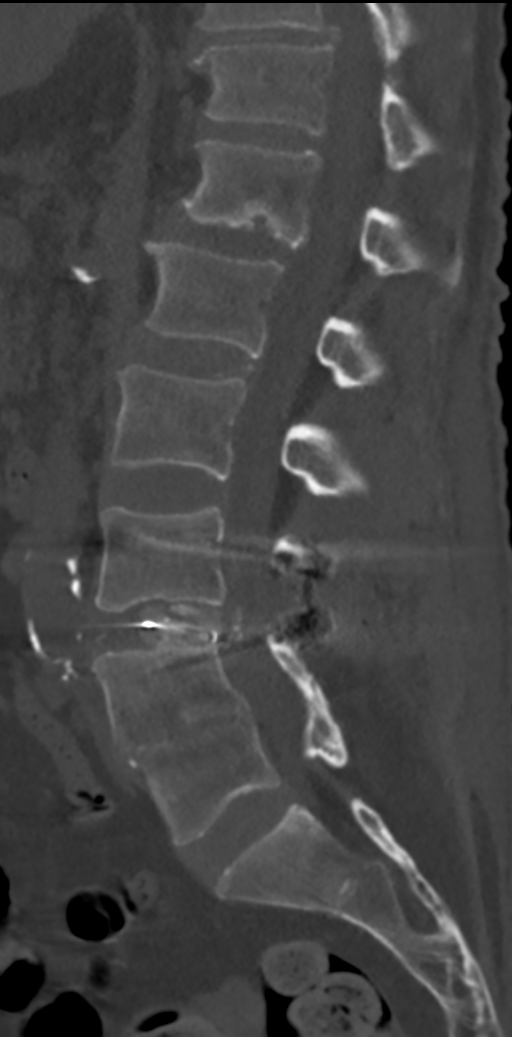
[im 41/61  bone]
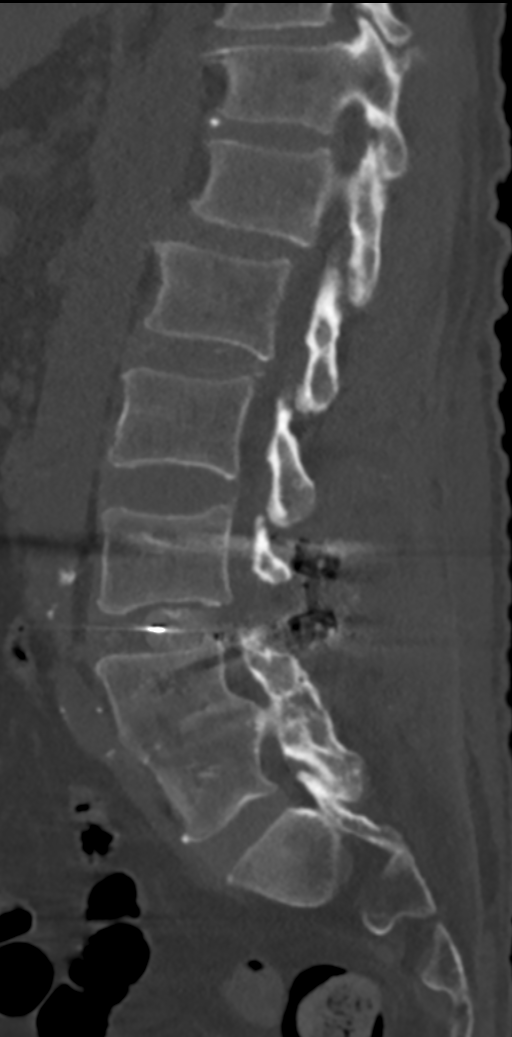

[11 of 33 positions shown; findings below may reference images not displayed]

FINDINGS: No acute fracture or subluxation of the lumbar spine. There are
postsurgical changes of L3-L4 laminectomy and posterior fixation
hardware. Small pockets of air noted at the operative bed. No
definite drainable fluid collection identified. Evaluation of this
area is limited in the absence of contrast as well as due to streak
artifact caused by metallic screws. There is evidence of prior L4-5
fusion.

Punctate nonobstructing right renal calculi. No hydronephrosis.
Aortoiliac atherosclerotic disease.
IMPRESSION: Postoperative changes of lower lumbar spine. No acute fracture or
subluxation.
